# Patient Record
Sex: Male | Born: 1935 | Race: White | Hispanic: No | Marital: Married | Smoking: Former smoker
Health system: Southern US, Community
[De-identification: ages and names within clinical notes are randomized; demographics above are authoritative.]

## PROBLEM LIST (undated history)

## (undated) DIAGNOSIS — I739 Peripheral vascular disease, unspecified: Secondary | ICD-10-CM

## (undated) DIAGNOSIS — I714 Abdominal aortic aneurysm, without rupture, unspecified: Secondary | ICD-10-CM

## (undated) DIAGNOSIS — I208 Other forms of angina pectoris: Secondary | ICD-10-CM

## (undated) DIAGNOSIS — I509 Heart failure, unspecified: Secondary | ICD-10-CM

## (undated) DIAGNOSIS — I10 Essential (primary) hypertension: Secondary | ICD-10-CM

## (undated) DIAGNOSIS — I219 Acute myocardial infarction, unspecified: Secondary | ICD-10-CM

## (undated) DIAGNOSIS — G459 Transient cerebral ischemic attack, unspecified: Secondary | ICD-10-CM

## (undated) DIAGNOSIS — I639 Cerebral infarction, unspecified: Secondary | ICD-10-CM

## (undated) DIAGNOSIS — J449 Chronic obstructive pulmonary disease, unspecified: Secondary | ICD-10-CM

## (undated) DIAGNOSIS — M353 Polymyalgia rheumatica: Secondary | ICD-10-CM

## (undated) DIAGNOSIS — I2089 Other forms of angina pectoris: Secondary | ICD-10-CM

## (undated) DIAGNOSIS — C61 Malignant neoplasm of prostate: Secondary | ICD-10-CM

## (undated) DIAGNOSIS — F419 Anxiety disorder, unspecified: Secondary | ICD-10-CM

## (undated) DIAGNOSIS — D649 Anemia, unspecified: Secondary | ICD-10-CM

## (undated) DIAGNOSIS — I2510 Atherosclerotic heart disease of native coronary artery without angina pectoris: Secondary | ICD-10-CM

## (undated) DIAGNOSIS — E785 Hyperlipidemia, unspecified: Secondary | ICD-10-CM

## (undated) HISTORY — DX: Atherosclerotic heart disease of native coronary artery without angina pectoris: I25.10

## (undated) HISTORY — DX: Other forms of angina pectoris: I20.89

## (undated) HISTORY — PX: CAROTID ENDARTERECTOMY: SUR193

## (undated) HISTORY — DX: Anemia, unspecified: D64.9

## (undated) HISTORY — DX: Malignant neoplasm of prostate: C61

## (undated) HISTORY — PX: CARDIAC CATHETERIZATION: SHX172

## (undated) HISTORY — DX: Cerebral infarction, unspecified: I63.9

## (undated) HISTORY — DX: Polymyalgia rheumatica: M35.3

## (undated) HISTORY — DX: Heart failure, unspecified: I50.9

## (undated) HISTORY — DX: Essential (primary) hypertension: I10

## (undated) HISTORY — DX: Abdominal aortic aneurysm, without rupture: I71.4

## (undated) HISTORY — DX: Hyperlipidemia, unspecified: E78.5

## (undated) HISTORY — DX: Abdominal aortic aneurysm, without rupture, unspecified: I71.40

## (undated) HISTORY — DX: Transient cerebral ischemic attack, unspecified: G45.9

## (undated) HISTORY — DX: Other forms of angina pectoris: I20.8

## (undated) HISTORY — PX: ABDOMINAL AORTIC ANEURYSM REPAIR: SUR1152

## (undated) HISTORY — PX: CORONARY ARTERY BYPASS GRAFT: SHX141

## (undated) HISTORY — DX: Chronic obstructive pulmonary disease, unspecified: J44.9

## (undated) HISTORY — DX: Peripheral vascular disease, unspecified: I73.9

## (undated) HISTORY — DX: Acute myocardial infarction, unspecified: I21.9

---

## 2010-04-28 ENCOUNTER — Ambulatory Visit: Payer: Medicare Other | Admitting: Internal Medicine

## 2010-05-03 ENCOUNTER — Inpatient Hospital Stay: Payer: Medicare Other | Admitting: *Deleted

## 2010-05-03 ENCOUNTER — Ambulatory Visit: Payer: Self-pay | Admitting: Cardiovascular Disease

## 2010-05-10 ENCOUNTER — Inpatient Hospital Stay: Payer: Medicare Other | Admitting: Internal Medicine

## 2010-05-10 ENCOUNTER — Ambulatory Visit: Payer: Self-pay | Admitting: Cardiovascular Disease

## 2010-05-16 ENCOUNTER — Encounter: Payer: Self-pay | Admitting: Cardiovascular Disease

## 2010-05-16 ENCOUNTER — Inpatient Hospital Stay: Payer: Medicare Other | Admitting: Internal Medicine

## 2010-05-18 ENCOUNTER — Ambulatory Visit: Payer: Self-pay | Admitting: Cardiovascular Disease

## 2010-05-21 LAB — PATHOLOGY REPORT

## 2010-05-28 ENCOUNTER — Ambulatory Visit: Payer: Self-pay | Admitting: Cardiovascular Disease

## 2010-05-28 DIAGNOSIS — E785 Hyperlipidemia, unspecified: Secondary | ICD-10-CM

## 2010-05-28 DIAGNOSIS — I6529 Occlusion and stenosis of unspecified carotid artery: Secondary | ICD-10-CM

## 2010-05-28 DIAGNOSIS — J449 Chronic obstructive pulmonary disease, unspecified: Secondary | ICD-10-CM | POA: Insufficient documentation

## 2010-05-28 DIAGNOSIS — I25810 Atherosclerosis of coronary artery bypass graft(s) without angina pectoris: Secondary | ICD-10-CM

## 2010-07-19 DIAGNOSIS — G459 Transient cerebral ischemic attack, unspecified: Secondary | ICD-10-CM

## 2010-07-19 HISTORY — DX: Transient cerebral ischemic attack, unspecified: G45.9

## 2010-08-07 ENCOUNTER — Encounter: Payer: Self-pay | Admitting: Cardiovascular Disease

## 2010-08-08 ENCOUNTER — Encounter: Payer: Self-pay | Admitting: Cardiovascular Disease

## 2010-08-09 ENCOUNTER — Encounter: Payer: Self-pay | Admitting: Cardiovascular Disease

## 2010-08-09 ENCOUNTER — Ambulatory Visit: Payer: Self-pay | Admitting: Cardiovascular Disease

## 2010-08-09 DIAGNOSIS — G459 Transient cerebral ischemic attack, unspecified: Secondary | ICD-10-CM | POA: Insufficient documentation

## 2010-08-30 ENCOUNTER — Encounter: Payer: Self-pay | Admitting: Cardiovascular Disease

## 2010-08-31 ENCOUNTER — Emergency Department: Payer: Medicare Other | Admitting: Emergency Medicine

## 2010-09-18 NOTE — Letter (Signed)
Summary: Medical Record Release  Medical Record Release   Imported By: Harlon Flor 06/08/2010 08:05:23  _____________________________________________________________________  External Attachment:    Type:   Image     Comment:   External Document

## 2010-09-18 NOTE — Consult Note (Signed)
Summary[facility]ist, [facility]   [place] [facility]   Imported By: Roderic Ovens 05/30/2010 10:19:32  _____________________________________________________________________  External Attachment:    Type:   Image     Comment:   External Document

## 2010-09-18 NOTE — Assessment & Plan Note (Signed)
Summary: F/U [facility] Chest pain   Visit Type:  Initial Consult Primary Provider:  Jeanie Cooks, M.D.  CC:  F/U [facility].Marland Kitchen  History of Present Illness: 75 year old male with history of smoking, COPD, coronary artery disease with bypass surgery in 2007, occlusion of 2 vein grafts with PCI x4, peripheral vascular disease with bilateral carotid endarterectomies in 2002, history of TIA in 2002 who presented several times to [facility] in September with COPD exacerbation, chest pain from coughing and possibly angina who presents to reestablish care after his discharge.  He reports that since discharge, he is feeling well. He was on a  long course of antibiotics, steroids and at the end of September had a upper GI bleed. He had an endoscope that showed esophagitis, gastritis and duodenitis. He has done better with no aspirin, discontinue Plavix and is on a proton pump inhibitor.  His shortness of breath and coughing has improved though he does continue to feel weak.  Last catheterization was August 2001 at [facility]. Dr. Georges Mouse was his physician/cardiologist  Of note he stopped smoking 2 years ago and smoked for 40 years  EKG shows normal sinus rhythm/sinus bradycardia with rate of 50 beats per minute, no significant ST or T wave changes  echocardiogram from September of 2011 shows normal systolic function estimated at greater than 55%, mild LVH, diastolic dysfunction, borderline elevated right ventricular systolic pressures, trace MR and TR  Current Medications (verified): 1)  Plavix 75 Mg Tabs (Clopidogrel Bisulfate) .... One Tablet Once Daily 2)  Metoprolol Succinate 100 Mg Xr24h-Tab (Metoprolol Succinate) .... One Tablet Once Daily 3)  Crestor 20 Mg Tabs (Rosuvastatin Calcium) .... One Tablet At Bedtime 4)  Zetia 10 Mg Tabs (Ezetimibe) .... One Tablet Once Daily 5)  Ranexa 500 Mg Xr12h-Tab (Ranolazine) .... One Tablet Once Daily 6)  Nitroglycerin 0.4 Mg/hr Pt24 (Nitroglycerin) .... As  Needed 7)  Lovaza 1 Gm Caps (Omega-3-Acid Ethyl Esters) .... One Tablet Once Daily 8)  Centrum Silver  Tabs (Multiple Vitamins-Minerals) .... One Tablet Once Daily 9)  Vitamin C Cr 500 Mg Cr-Caps (Ascorbic Acid) .... One Tablet Once Daily 10)  Combivent 18-103 Mcg/act Aero (Ipratropium-Albuterol) .... As Directed 11)  Protonix 40 Mg Tbec (Pantoprazole Sodium) .... One Tablet Once Daily For Two Weeks 12)  Advair Diskus 250-50 Mcg/dose Aepb (Fluticasone-Salmeterol) .... Inhalation Two Times A Day 13)  Spiriva Handihaler 18 Mcg Caps (Tiotropium Bromide Monohydrate) .... One Inhalatation Daily 14)  Duoneb 0.5-2.5 (3) Mg/106ml Soln (Ipratropium-Albuterol) .... Four Times Once Daily  Allergies (verified): 1)  ! Steroids  Past History:  Past Medical History: Last updated: 05/28/2010 CAD Hypertension chronic angina PVD prostate cancer s/p brachytherapy hyperlipidemia COPD  Past Surgical History: Last updated: 05/28/2010 CAD; s/p CABG 6 yrs. ago Stents 2009 Bilateral carotid endarterectomies  Family History: Last updated: 05/28/2010 Family History of Diabetes: mother; died of old age with CAD Father: Deceased throat and stomach cancer. Siblings: Brother deceased age 45 of MI and polio.  Social History: Last updated: 05/28/2010 Tobacco Use - Former. Smoked x 40 years 1/2-1 PPD. Quit 2009. Alcohol Use - no Drug Use - no Retired -- IT consultant.  Risk Factors: Smoking Status: quit (05/28/2010)  Social History: Tobacco Use - Former. Smoked x 40 years 1/2-1 PPD. Quit 2009. Alcohol Use - no Drug Use - no Retired -- IT consultant.  Review of Systems  The patient denies fever, weight loss, weight gain, vision loss, decreased hearing, hoarseness, chest pain, syncope, dyspnea on exertion, peripheral edema, prolonged  cough, abdominal pain, incontinence, muscle weakness, depression, and enlarged lymph nodes.    Vital Signs:  Patient profile:   76 year old  male Height:      73 inches Weight:      225 pounds BMI:     29.79 Pulse rate:   52 / minute BP sitting:   154 / 84  (left arm) Cuff size:   regular  Vitals Entered By: Bishop Dublin, CMA (May 28, 2010 2:59 PM)  Nutrition Counseling: Patient's BMI is greater than 25 and therefore counseled on weight management options.  Physical Exam  General:  Well developed, well nourished, in no acute distress. Head:  normocephalic and atraumatic Neck:  Neck supple, no JVD. No masses, thyromegaly or abnormal cervical nodes. Lungs:  mildly decreased breath sounds throughout bilaterally Heart:  Non-displaced PMI, chest non-tender; regular rate and rhythm, S1, S2 without murmurs, rubs or gallops. Carotid upstroke normal, no bruit.. Pedals normal pulses. No edema, no varicosities. Abdomen:  Bowel sounds positive; abdomen soft and non-tender without masses Msk:  Back normal, normal gait. Muscle strength and tone normal. Neurologic:  Alert and oriented x 3. Skin:  Intact without lesions or rashes. Psych:  Normal affect.   Impression & Recommendations:  Problem # 1:  CAD, ARTERY BYPASS GRAFT (ICD-414.04) history of bypass surgery in 2007. We'll try to obtain these records for our notes. No chest pain at this time. No further testing.  The following medications were removed from the medication list:    Buffered Aspirin 325 Mg Tabs (Aspirin buf(cacarb-mgcarb-mgo)) ..... One tablet once daily His updated medication list for this problem includes:    Plavix 75 Mg Tabs (Clopidogrel bisulfate) ..... One tablet once daily    Metoprolol Succinate 100 Mg Xr24h-tab (Metoprolol succinate) ..... One tablet once daily    Ranexa 500 Mg Xr12h-tab (Ranolazine) ..... One tablet once daily    Nitroglycerin 0.4 Mg/hr Pt24 (Nitroglycerin) .Marland Kitchen... As needed  Problem # 2:  CAROTID ARTERY STENOSIS, WITHOUT INFARCTION (ICD-433.10) Bilateral carotid endarterectomy in 2002. We'll try to obtain his most recent carotid  ultrasound for record. He reports having one approximately 8 months ago. Continue Plavix. Aspirin was held secondary to GI bleed but will be restarted at 81 mg in 2 weeks' time.  The following medications were removed from the medication list:    Buffered Aspirin 325 Mg Tabs (Aspirin buf(cacarb-mgcarb-mgo)) ..... One tablet once daily His updated medication list for this problem includes:    Plavix 75 Mg Tabs (Clopidogrel bisulfate) ..... One tablet once daily  Problem # 3:  HYPERLIPIDEMIA-MIXED (ICD-272.4) He is on aggressive medical management. We'll try to obtain a copy of his most recent lipid panel prior records.  His updated medication list for this problem includes:    Crestor 20 Mg Tabs (Rosuvastatin calcium) ..... One tablet at bedtime    Zetia 10 Mg Tabs (Ezetimibe) ..... One tablet once daily    Lovaza 1 Gm Caps (Omega-3-acid ethyl esters) ..... One tablet once daily  Problem # 4:  COPD (ICD-496) On history of smoking though he stopped 40 years ago. He did just recover from a COPD exacerbation. This required a long course of steroids, antibiotics and nebulizer treatments  His updated medication list for this problem includes:    Combivent 18-103 Mcg/act Aero (Ipratropium-albuterol) .Marland Kitchen... As directed    Advair Diskus 250-50 Mcg/dose Aepb (Fluticasone-salmeterol) ..... Inhalation two times a day    Spiriva Handihaler 18 Mcg Caps (Tiotropium bromide monohydrate) ..... One inhalatation daily  Duoneb 0.5-2.5 (3) Mg/31ml Soln (Ipratropium-albuterol) .Marland Kitchen... Four times once daily  Patient Instructions: 1)  Your physician recommends that you continue on your current medications as directed. Please refer to the Current Medication list given to you today. 2)  Your physician wants you to follow-up in:   6 months You will receive a reminder letter in the mail two months in advance. If you don't receive a letter, please call our office to schedule the follow-up appointment.

## 2010-09-20 NOTE — Letter (Signed)
Summary: [facility]   Imported By: Roderic Ovens 09/05/2010 15:02:00  _____________________________________________________________________  External Attachment:    Type:   Image     Comment:   External Document

## 2010-09-20 NOTE — Assessment & Plan Note (Signed)
Summary: ROV/AMD   Visit Type:  Follow-up Primary Provider:  Jeanie Cooks, M.D.  CC:  F/U [facility]; TIA. Is wearing a 24 hour monitor.Marland Kitchen  History of Present Illness: 75 year old male with history of smoking, COPD, coronary artery disease with bypass surgery in 2007, occlusion of 2 vein grafts with PCI x4, peripheral vascular disease with bilateral carotid endarterectomies in 2002, history of TIA in 2002 who presented several times to [facility] in September with COPD exacerbation, who presents after a recent TIA.  This past Monday, he developed arm and leg weakness on the left in the late evening. By the time he arrived at [facility] by EMT his symptoms were resolving. TPA was not given. He had significant workup including CT, MRI, MRA, echocardiogram and is now wearing a Holter monitor. he feels well and has no residual deficits. Overall he feels "100% ".  He was found to be resistant to Plavix by P2 Y. 12 assay.  hospital records indicate that he has asymmetric LVH that is severe, diastolic relaxation abnormality, normal systolic function, mild MR significant chordal SAM with LVOT gradient of 3 m/s and gradient of 35 mm of mercury and increases to gradient of 49 with Valsalva  Last catheterization was August 2001 at [facility]. Dr. Georges Mouse was his physician/cardiologist  Of note he stopped smoking 2 years ago and smoked for 40 years  EKG shows normal sinus rhythm with rate of 56 beats per minute, no significant ST or T wave changes  Current Medications (verified): 1)  Metoprolol Succinate 100 Mg Xr24h-Tab (Metoprolol Succinate) .... One Tablet Once Daily 2)  Crestor 20 Mg Tabs (Rosuvastatin Calcium) .... One Tablet At Bedtime 3)  Zetia 10 Mg Tabs (Ezetimibe) .... One Tablet Once Daily 4)  Ranexa 500 Mg Xr12h-Tab (Ranolazine) .... One Tablet Once Daily 5)  Nitroglycerin 0.4 Mg/hr Pt24 (Nitroglycerin) .... As Needed 6)  Centrum Silver  Tabs (Multiple Vitamins-Minerals) .... One Tablet Once  Daily 7)  Vitamin C Cr 500 Mg Cr-Caps (Ascorbic Acid) .... One Tablet Once Daily 8)  Combivent 18-103 Mcg/act Aero (Ipratropium-Albuterol) .... As Directed 9)  Advair Diskus 250-50 Mcg/dose Aepb (Fluticasone-Salmeterol) .... Inhalation Two Times A Day 10)  Spiriva Handihaler 18 Mcg Caps (Tiotropium Bromide Monohydrate) .... One Inhalatation Daily 11)  Duoneb 0.5-2.5 (3) Mg/14ml Soln (Ipratropium-Albuterol) .... Four Times Once Daily 12)  Aggrenox 25-200 Mg Xr12h-Cap (Aspirin-Dipyridamole) .... One Tablet Every 12 Hours 13)  Hydrochlorothiazide 12.5 Mg Caps (Hydrochlorothiazide) .... One Tablet Once Daily 14)  Aspir-Low 81 Mg Tbec (Aspirin) .... One Tablet Once Daily 15)  Eligard Subq .... Subq Every 6 Months. 16)  Fish Oil 300 Mg Caps (Omega-3 Fatty Acids) .... One Tablet Two Times A Day 17)  Stool Softener 100 Mg Caps (Docusate Sodium) .... One Caps Two Times A Day  Allergies (verified): 1)  ! Steroids  Past History:  Past Medical History: Last updated: 05/28/2010 CAD Hypertension chronic angina PVD prostate cancer s/p brachytherapy hyperlipidemia COPD  Past Surgical History: Last updated: 05/28/2010 CAD; s/p CABG 6 yrs. ago Stents 2009 Bilateral carotid endarterectomies  Family History: Last updated: 05/28/2010 Family History of Diabetes: mother; died of old age with CAD Father: Deceased throat and stomach cancer. Siblings: Brother deceased age 29 of MI and polio.  Social History: Last updated: 05/28/2010 Tobacco Use - Former. Smoked x 40 years 1/2-1 PPD. Quit 2009. Alcohol Use - no Drug Use - no Retired -- IT consultant.  Risk Factors: Smoking Status: quit (05/28/2010)  Review of Systems  The  patient denies fever, weight loss, weight gain, vision loss, decreased hearing, hoarseness, chest pain, syncope, dyspnea on exertion, peripheral edema, prolonged cough, abdominal pain, incontinence, muscle weakness, depression, and enlarged lymph nodes.    Vital  Signs:  Patient profile:   75 year old male Height:      73 inches Weight:      235 pounds BMI:     31.12 Pulse rate:   56 / minute BP sitting:   120 / 82  (left arm) Cuff size:   large  Vitals Entered By: Bishop Dublin, CMA (August 09, 2010 3:38 PM)  Physical Exam  General:  Well developed, well nourished, in no acute distress.elderly gentleman Head:  normocephalic and atraumatic Neck:  Neck supple, no JVD. No masses, thyromegaly or abnormal cervical nodes. Lungs:  mildly decreased breath sounds throughout bilaterally Heart:  Non-displaced PMI, chest non-tender; regular rate and rhythm, S1, S2 without murmurs, rubs or gallops. Carotid upstroke normal, no bruit.. Pedals normal pulses. No edema, no varicosities. Abdomen:  Bowel sounds positive; abdomen soft and non-tender without masses Msk:  Back normal, normal gait. Muscle strength and tone normal. Pulses:  pulses normal in all 4 extremities Extremities:  No clubbing or cyanosis. Neurologic:  Alert and oriented x 3. Skin:  Intact without lesions or rashes. Psych:  Normal affect.   Impression & Recommendations:  Problem # 1:  TIA (ICD-435.9) recent TIA, now has made a full recovery. I am concerned that the Aggrenox will be cost prohibitive as he is in his doughnut hole. I've asked him to Price the medication to confirm this. Alternatively, he can take aspirin 81 mg daily with effient.  He is resistant to Plavix.  The etiology of his TIA is likely secondary to embolic from atherosclerotic disease.  Problem # 2:  HYPERLIPIDEMIA-MIXED (ICD-272.4)  cholesterol is at goal on his current medication regimen.  The following medications were removed from the medication list:    Lovaza 1 Gm Caps (Omega-3-acid ethyl esters) ..... One tablet once daily His updated medication list for this problem includes:    Crestor 20 Mg Tabs (Rosuvastatin calcium) ..... One tablet at bedtime    Zetia 10 Mg Tabs (Ezetimibe) ..... One tablet  once daily  The following medications were removed from the medication list:    Lovaza 1 Gm Caps (Omega-3-acid ethyl esters) ..... One tablet once daily His updated medication list for this problem includes:    Crestor 20 Mg Tabs (Rosuvastatin calcium) ..... One tablet at bedtime    Zetia 10 Mg Tabs (Ezetimibe) ..... One tablet once daily  Problem # 3:  CAD, ARTERY BYPASS GRAFT (ICD-414.04) No symptoms of chest pain at this time. We did mention to him that he could hold his Ranexa and if he did not have any chest discomfort, could continue to hold the medication.  The following medications were removed from the medication list:    Plavix 75 Mg Tabs (Clopidogrel bisulfate) ..... One tablet once daily    Ranexa 500 Mg Xr12h-tab (Ranolazine) ..... One tablet once daily His updated medication list for this problem includes:    Metoprolol Succinate 100 Mg Xr24h-tab (Metoprolol succinate) ..... One tablet once daily    Nitroglycerin 0.4 Mg/hr Pt24 (Nitroglycerin) .Marland Kitchen... As needed    Aggrenox 25-200 Mg Xr12h-cap (Aspirin-dipyridamole) ..... One tablet every 12 hours    Aspir-low 81 Mg Tbec (Aspirin) ..... One tablet once daily    Effient 10 Mg Tabs (Prasugrel hcl) .Marland Kitchen... Take one tablet by mouth daily  Problem # 4:  CAROTID ARTERY STENOSIS, WITHOUT INFARCTION (ICD-433.10)  MRA shows patent carotids.  The following medications were removed from the medication list:    Plavix 75 Mg Tabs (Clopidogrel bisulfate) ..... One tablet once daily His updated medication list for this problem includes:    Aggrenox 25-200 Mg Xr12h-cap (Aspirin-dipyridamole) ..... One tablet every 12 hours    Aspir-low 81 Mg Tbec (Aspirin) ..... One tablet once daily    Effient 10 Mg Tabs (Prasugrel hcl) .Marland Kitchen... Take one tablet by mouth daily  The following medications were removed from the medication list:    Plavix 75 Mg Tabs (Clopidogrel bisulfate) ..... One tablet once daily His updated medication list for this problem  includes:    Aggrenox 25-200 Mg Xr12h-cap (Aspirin-dipyridamole) ..... One tablet every 12 hours    Aspir-low 81 Mg Tbec (Aspirin) ..... One tablet once daily    Effient 10 Mg Tabs (Prasugrel hcl) .Marland Kitchen... Take one tablet by mouth daily  Problem # 5:  COPD (ICD-496)  He does have shortness of breath. Long history of smoking. Stopped several years ago.  His updated medication list for this problem includes:    Combivent 18-103 Mcg/act Aero (Ipratropium-albuterol) .Marland Kitchen... As directed    Advair Diskus 250-50 Mcg/dose Aepb (Fluticasone-salmeterol) ..... Inhalation two times a day    Spiriva Handihaler 18 Mcg Caps (Tiotropium bromide monohydrate) ..... One inhalatation daily    Duoneb 0.5-2.5 (3) Mg/44ml Soln (Ipratropium-albuterol) .Marland Kitchen... Four times once daily  His updated medication list for this problem includes:    Combivent 18-103 Mcg/act Aero (Ipratropium-albuterol) .Marland Kitchen... As directed    Advair Diskus 250-50 Mcg/dose Aepb (Fluticasone-salmeterol) ..... Inhalation two times a day    Spiriva Handihaler 18 Mcg Caps (Tiotropium bromide monohydrate) ..... One inhalatation daily    Duoneb 0.5-2.5 (3) Mg/62ml Soln (Ipratropium-albuterol) .Marland Kitchen... Four times once daily  Patient Instructions: 1)  Your physician recommends that you schedule a follow-up appointment in: 3 months 2)  Your physician has recommended you make the following change in your medication: STOP Ranexa.START Effient 10mg  once daily. Prescriptions: EFFIENT 10 MG TABS (PRASUGREL HCL) Take one tablet by mouth daily  #30 x 6   Entered by:   Lanny Hurst RN   Authorized by:   Dossie Arbour MD   Signed by:   Lanny Hurst RN on 08/09/2010   Method used:   Electronically to        [facility] [phone]* (retail)       [address]       [place], [place]  [address]       Ph: [phone]       Fax:    RxID:   [id number]

## 2010-09-26 NOTE — Letter (Signed)
Summary: [facility] - Discharge Summary  [facility] - Discharge Summary   Imported By: Marylou Mccoy 09/19/2010 10:42:02  _____________________________________________________________________  External Attachment:    Type:   Image     Comment:   External Document

## 2010-09-26 NOTE — Letter (Signed)
Summary: [facility]  Neurology Interval Note   [facility] Neurology Interval Note   Imported By: Roderic Ovens 09/21/2010 14:31:41  _____________________________________________________________________  External Attachment:    Type:   Image     Comment:   External Document

## 2010-10-27 ENCOUNTER — Ambulatory Visit: Payer: Medicare Other | Admitting: Family Medicine

## 2010-11-08 ENCOUNTER — Encounter: Payer: Self-pay | Admitting: Cardiovascular Disease

## 2010-11-08 ENCOUNTER — Ambulatory Visit: Payer: Self-pay | Admitting: Cardiovascular Disease

## 2010-11-08 ENCOUNTER — Ambulatory Visit (INDEPENDENT_AMBULATORY_CARE_PROVIDER_SITE_OTHER): Payer: Medicare Other | Admitting: Cardiovascular Disease

## 2010-11-08 ENCOUNTER — Telehealth: Payer: Self-pay | Admitting: Cardiovascular Disease

## 2010-11-08 DIAGNOSIS — G459 Transient cerebral ischemic attack, unspecified: Secondary | ICD-10-CM

## 2010-11-08 DIAGNOSIS — J449 Chronic obstructive pulmonary disease, unspecified: Secondary | ICD-10-CM

## 2010-11-08 DIAGNOSIS — I6529 Occlusion and stenosis of unspecified carotid artery: Secondary | ICD-10-CM

## 2010-11-08 DIAGNOSIS — I25810 Atherosclerosis of coronary artery bypass graft(s) without angina pectoris: Secondary | ICD-10-CM

## 2010-11-08 DIAGNOSIS — E785 Hyperlipidemia, unspecified: Secondary | ICD-10-CM

## 2010-11-08 MED ORDER — ASPIRIN 81 MG PO TBEC: 162.0000 mg | DELAYED_RELEASE_TABLET | Freq: Every day | ORAL | Status: DC

## 2010-11-08 NOTE — Assessment & Plan Note (Signed)
History of bilateral carotid endarterectomies. Would repeat carotid ultrasound early in 2013

## 2010-11-08 NOTE — Assessment & Plan Note (Signed)
History of underlying COPD. He stopped smoking many years ago. Continued mild chronic shortness of breath with episodic bronchitis.

## 2010-11-08 NOTE — Assessment & Plan Note (Signed)
Cholesterol is at goal on the current lipid regimen. No changes to the medications were made.  

## 2010-11-08 NOTE — Patient Instructions (Addendum)
Please increase asa to 81 mg x2. Stop effient when you run out of your current prescription.  Carotid ultrasound in December 2012 or early 2013. Follow in in 6 months.

## 2010-11-08 NOTE — Assessment & Plan Note (Signed)
Currently with no symptoms of angina. No further workup at this time. Continue current medication regimen. 

## 2010-11-08 NOTE — Assessment & Plan Note (Signed)
No further TIA episodes. We'll continue aggressive medical management. Will increase aspirin to 81 mg x2. He is unable to afford effient.

## 2010-11-08 NOTE — Progress Notes (Signed)
Patient ID: Javier [name], male    DOB: 1936/01/03, 75 y.o.   MRN: [id number]  HPI 75 year old male with history of smoking, COPD, coronary artery disease with bypass surgery in 2007, occlusion of 2 vein grafts with PCI x4, peripheral vascular disease with bilateral carotid endarterectomies in 2002, history of TIA in 2002 who presented several times to [facility] in September with COPD exacerbation,TIA, who presents for routine followup.  He reports that he has been doing well. He denies any significant chest pain. He does have baseline mild shortness of breath with no cough. He is getting over a bronchitis which he reports having several times per year. He is otherwise active and has no complaints.  Several months ago, he developed arm and leg weakness on the left in the late evening. By the time he arrived at [facility] by EMT his symptoms were resolving. TPA was not given. He had significant workup including CT, MRI, MRA, echocardiogram and is now wearing a Holter monitor. he feels well and has no residual deficits. Overall he feels "100% ".  He was found to be resistant to Plavix by P2 Y12 assay.  hospital records indicate that he has asymmetric LVH that is severe, diastolic relaxation abnormality, normal systolic function, mild MR significant chordal SAM with LVOT gradient of 3 m/s and gradient of 35 mm of mercury and increases to gradient of 49 with Valsalva  Last catheterization was August 2001 at [facility]. Dr. Georges Mouse was his physician/cardiologist Of note he stopped smoking 2 years ago and smoked for 73 years  Old EKG shows normal sinus rhythm with rate of 56 beats per minute, no significant ST or T wave changes   Review of Systems  Constitutional: Negative.   HENT: Negative.   Eyes: Negative.   Respiratory: Positive for shortness of breath. Negative for cough, choking, chest tightness, wheezing and stridor.   Cardiovascular: Negative.   Gastrointestinal: Negative.     Musculoskeletal: Negative.   Skin: Negative.   Neurological: Negative.   Hematological: Negative.   Psychiatric/Behavioral: Negative.   All other systems reviewed and are negative.   BP 129/76  Pulse 69  Ht 6\' 1"  (1.854 m)  Wt 232 lb 6.4 oz (105.416 kg)  BMI 30.66 kg/m2   Physical Exam  Nursing note and vitals reviewed. Constitutional: He is oriented to person, place, and time. He appears well-developed and well-nourished.  HENT:  Head: Normocephalic.  Nose: Nose normal.  Mouth/Throat: Oropharynx is clear and moist.  Eyes: Conjunctivae are normal. Pupils are equal, round, and reactive to light.  Neck: Normal range of motion. Neck supple. No JVD present.  Cardiovascular: Normal rate, regular rhythm, normal heart sounds and intact distal pulses.  Exam reveals no gallop and no friction rub.   No murmur heard. Pulmonary/Chest: Effort normal. No respiratory distress. He has no wheezes. He has no rales. He exhibits no tenderness.       Mildly decreased BS b/l throughout  Abdominal: Soft. Bowel sounds are normal. He exhibits no distension. There is no tenderness.  Musculoskeletal: Normal range of motion. He exhibits no edema and no tenderness.  Lymphadenopathy:    He has no cervical adenopathy.  Neurological: He is alert and oriented to person, place, and time. Coordination normal.  Skin: Skin is warm and dry. No rash noted. No erythema.  Psychiatric: He has a normal mood and affect. His behavior is normal. Judgment and thought content normal.           Assessment  and Plan

## 2010-11-08 NOTE — Telephone Encounter (Signed)
LMOM TCB to move am appt to later today.  Javier [name] is in a cath.

## 2010-11-09 ENCOUNTER — Encounter: Payer: Self-pay | Admitting: Cardiovascular Disease

## 2010-11-18 ENCOUNTER — Inpatient Hospital Stay: Payer: Medicare Other | Admitting: Internal Medicine

## 2010-11-18 DIAGNOSIS — I219 Acute myocardial infarction, unspecified: Secondary | ICD-10-CM

## 2010-11-18 HISTORY — DX: Acute myocardial infarction, unspecified: I21.9

## 2010-11-20 DIAGNOSIS — R000 Tachycardia, unspecified: Secondary | ICD-10-CM

## 2010-11-26 ENCOUNTER — Encounter: Payer: Self-pay | Admitting: Cardiovascular Disease

## 2010-12-06 ENCOUNTER — Encounter: Payer: No Typology Code available for payment source | Admitting: Cardiovascular Disease

## 2010-12-10 ENCOUNTER — Ambulatory Visit (INDEPENDENT_AMBULATORY_CARE_PROVIDER_SITE_OTHER): Payer: Medicare Other | Admitting: Cardiovascular Disease

## 2010-12-10 ENCOUNTER — Encounter: Payer: Self-pay | Admitting: Cardiovascular Disease

## 2010-12-10 DIAGNOSIS — G459 Transient cerebral ischemic attack, unspecified: Secondary | ICD-10-CM

## 2010-12-10 DIAGNOSIS — I2510 Atherosclerotic heart disease of native coronary artery without angina pectoris: Secondary | ICD-10-CM

## 2010-12-10 DIAGNOSIS — Z9889 Other specified postprocedural states: Secondary | ICD-10-CM

## 2010-12-10 DIAGNOSIS — J449 Chronic obstructive pulmonary disease, unspecified: Secondary | ICD-10-CM

## 2010-12-10 DIAGNOSIS — I6529 Occlusion and stenosis of unspecified carotid artery: Secondary | ICD-10-CM

## 2010-12-10 DIAGNOSIS — I25810 Atherosclerosis of coronary artery bypass graft(s) without angina pectoris: Secondary | ICD-10-CM

## 2010-12-10 DIAGNOSIS — E785 Hyperlipidemia, unspecified: Secondary | ICD-10-CM

## 2010-12-10 NOTE — Assessment & Plan Note (Signed)
We have suggested that he have his cholesterol checked in 3 months time. He is now on Lipitor and zetia.

## 2010-12-10 NOTE — Progress Notes (Signed)
   Patient ID: Javier [name], male    DOB: Nov 25, 1935, 75 y.o.   MRN: [id number]  HPI Comments: 75 year old male with history of smoking, COPD, coronary artery disease with bypass surgery in 2007, occlusion of 2 vein grafts with PCI x4, peripheral vascular disease with bilateral carotid endarterectomies in 2002, history of TIA in 2002 who presented several times to [facility] in September with COPD exacerbation,  TIA, Presents after a recent episode of COPD exacerbation/bronchitis also with episode of shortness of breath and chest pain that took him to [facility] where he had a cardiac catheterization showing patent vessels with no significant progression of his disease. We have are requested this report for our records.  He reports that he had a small troponin elevation in the setting of profound coughing. Cardiac catheter showed no progression of his disease. He has since felt well. He realized that he was not taking his inhalers on a regular basis which he is doing now. He takes Spiriva and Advair on a regular daily basis with albuterol p.r.n..   He was found to be resistant to Plavix by P2 Y. 12 assay During the hospital course for his TIA at [facility] records indicate that he has asymmetric LVH that is severe, diastolic relaxation abnormality, normal systolic function, mild MR significant chordal SAM with LVOT gradient of 3 m/s and gradient of 35 mm of mercury and increases to gradient of 49 with Valsalva  Of note he stopped smoking 2 years ago and smoked for 40 years  EKG shows normal sinus rhythm with rate of 59 beats per minute, no significant ST or T wave changes      Review of Systems  Constitutional: Negative.   HENT: Negative.   Eyes: Negative.   Respiratory: Negative.   Cardiovascular: Negative.   Gastrointestinal: Negative.   Musculoskeletal: Negative.   Skin: Negative.   Neurological: Negative.   Hematological: Negative.   Psychiatric/Behavioral: Negative.   All other  systems reviewed and are negative.    BP 132/70  Pulse 59  Ht 6\' 1"  (1.854 m)  Wt 236 lb 12.8 oz (107.412 kg)  BMI 31.24 kg/m2   Physical Exam  Nursing note and vitals reviewed. Constitutional: He is oriented to person, place, and time. He appears well-developed and well-nourished.  HENT:  Head: Normocephalic.  Nose: Nose normal.  Mouth/Throat: Oropharynx is clear and moist.  Eyes: Conjunctivae are normal. Pupils are equal, round, and reactive to light.  Neck: Normal range of motion. Neck supple. No JVD present.  Cardiovascular: Normal rate, regular rhythm, S1 normal, S2 normal, normal heart sounds and intact distal pulses.  Exam reveals no gallop and no friction rub.   No murmur heard. Pulmonary/Chest: Effort normal. No respiratory distress. He has decreased breath sounds. He has no wheezes. He has no rales. He exhibits no tenderness.  Abdominal: Soft. Bowel sounds are normal. He exhibits no distension. There is no tenderness.  Musculoskeletal: Normal range of motion. He exhibits no edema and no tenderness.  Lymphadenopathy:    He has no cervical adenopathy.  Neurological: He is alert and oriented to person, place, and time. Coordination normal.  Skin: Skin is warm and dry. No rash noted. No erythema.  Psychiatric: He has a normal mood and affect. His behavior is normal. Judgment and thought content normal.           Assessment and Plan

## 2010-12-10 NOTE — Patient Instructions (Signed)
You are doing well. Hold effient. Start aspirin 81 x2 daily. Check cholesterol in 3 months. Carotid ultrasound will be scheduled, next available.  Please call us if you have new issues that need to be addressed before your next appt.  We will call you for a follow up Appt. In 6 months

## 2010-12-10 NOTE — Assessment & Plan Note (Signed)
Appears to be well controlled on his current medication regimen.

## 2010-12-10 NOTE — Assessment & Plan Note (Signed)
We will schedule a carotid ultrasound given that it has been in numerous years since this was done.

## 2010-12-10 NOTE — Assessment & Plan Note (Addendum)
Currently with no symptoms of angina. No further workup at this time. Continue current medication regimen. We will change him to aspirin 81 mg x2, hold the effient.

## 2010-12-12 ENCOUNTER — Ambulatory Visit: Payer: No Typology Code available for payment source | Admitting: Cardiovascular Disease

## 2010-12-20 ENCOUNTER — Encounter (INDEPENDENT_AMBULATORY_CARE_PROVIDER_SITE_OTHER): Payer: Medicare Other | Admitting: *Deleted

## 2010-12-20 DIAGNOSIS — I6529 Occlusion and stenosis of unspecified carotid artery: Secondary | ICD-10-CM

## 2010-12-20 DIAGNOSIS — Z9889 Other specified postprocedural states: Secondary | ICD-10-CM

## 2010-12-24 ENCOUNTER — Encounter: Payer: Self-pay | Admitting: Cardiovascular Disease

## 2011-01-06 ENCOUNTER — Encounter: Payer: Self-pay | Admitting: Cardiovascular Disease

## 2011-01-16 ENCOUNTER — Encounter: Payer: Self-pay | Admitting: Cardiovascular Disease

## 2011-01-30 ENCOUNTER — Encounter: Payer: Medicare Other | Admitting: Cardiovascular Disease

## 2011-02-17 ENCOUNTER — Encounter: Payer: Medicare Other | Admitting: Cardiovascular Disease

## 2011-03-01 ENCOUNTER — Encounter: Payer: Self-pay | Admitting: Cardiovascular Disease

## 2011-03-11 ENCOUNTER — Other Ambulatory Visit (INDEPENDENT_AMBULATORY_CARE_PROVIDER_SITE_OTHER): Payer: Medicare Other | Admitting: *Deleted

## 2011-03-11 DIAGNOSIS — E785 Hyperlipidemia, unspecified: Secondary | ICD-10-CM

## 2011-03-11 DIAGNOSIS — E789 Disorder of lipoprotein metabolism, unspecified: Secondary | ICD-10-CM

## 2011-03-12 LAB — LIPID PANEL
Cholesterol: 176 mg/dL (ref 0–200)
HDL: 37 mg/dL — ABNORMAL LOW (ref >39)
Total CHOL/HDL Ratio: 4.8 Ratio
Triglycerides: 298 mg/dL — ABNORMAL HIGH (ref <150)
VLDL: 60 mg/dL — ABNORMAL HIGH (ref 0–40)

## 2011-03-12 LAB — HEPATIC FUNCTION PANEL
ALT: 16 U/L (ref 0–53)
Albumin: 4.3 g/dL (ref 3.5–5.2)
Total Protein: 6.7 g/dL (ref 6.0–8.3)

## 2011-03-20 ENCOUNTER — Encounter: Payer: Medicare Other | Admitting: Cardiovascular Disease

## 2011-03-25 ENCOUNTER — Encounter: Payer: Self-pay | Admitting: Cardiovascular Disease

## 2011-04-20 ENCOUNTER — Encounter: Payer: Medicare Other | Admitting: Cardiovascular Disease

## 2011-04-29 ENCOUNTER — Encounter: Payer: Medicare Other | Admitting: Orthopaedic Surgery

## 2011-05-20 ENCOUNTER — Encounter: Payer: Medicare Other | Admitting: Orthopaedic Surgery

## 2011-05-20 ENCOUNTER — Encounter: Payer: Medicare Other | Admitting: Cardiovascular Disease

## 2011-06-05 ENCOUNTER — Encounter: Payer: Self-pay | Admitting: Cardiovascular Disease

## 2011-06-10 ENCOUNTER — Ambulatory Visit: Payer: Medicare Other | Admitting: Cardiovascular Disease

## 2011-06-19 ENCOUNTER — Encounter: Payer: Self-pay | Admitting: Cardiovascular Disease

## 2011-06-19 ENCOUNTER — Ambulatory Visit (INDEPENDENT_AMBULATORY_CARE_PROVIDER_SITE_OTHER): Payer: Medicare Other | Admitting: Cardiovascular Disease

## 2011-06-19 VITALS — BP 124/80 | HR 68 | Ht 73.0 in | Wt 246.0 lb

## 2011-06-19 DIAGNOSIS — I2510 Atherosclerotic heart disease of native coronary artery without angina pectoris: Secondary | ICD-10-CM

## 2011-06-19 DIAGNOSIS — I25810 Atherosclerosis of coronary artery bypass graft(s) without angina pectoris: Secondary | ICD-10-CM

## 2011-06-19 DIAGNOSIS — J4489 Other specified chronic obstructive pulmonary disease: Secondary | ICD-10-CM

## 2011-06-19 DIAGNOSIS — I6529 Occlusion and stenosis of unspecified carotid artery: Secondary | ICD-10-CM

## 2011-06-19 DIAGNOSIS — E785 Hyperlipidemia, unspecified: Secondary | ICD-10-CM

## 2011-06-19 DIAGNOSIS — J449 Chronic obstructive pulmonary disease, unspecified: Secondary | ICD-10-CM

## 2011-06-19 MED ORDER — FUROSEMIDE 20 MG PO TABS: 20.0000 mg | ORAL_TABLET | Freq: Every day | ORAL | Status: DC

## 2011-06-19 MED ORDER — NITROGLYCERIN 0.4 MG SL SUBL: 0.4000 mg | SUBLINGUAL_TABLET | SUBLINGUAL | Status: DC | PRN

## 2011-06-19 NOTE — Assessment & Plan Note (Signed)
Bilateral carotid endarterectomy in the past. We'll need to continue aggressive cholesterol management.

## 2011-06-19 NOTE — Assessment & Plan Note (Signed)
Long smoking history and underlying COPD. Several recent episodes of bronchitis with difficulty recovering requiring long courses of antibiotics

## 2011-06-19 NOTE — Assessment & Plan Note (Signed)
Cholesterol is not at goal. Goal LDL less than 70. We have encouraged him to watch his diet

## 2011-06-19 NOTE — Assessment & Plan Note (Signed)
Recent episode of angina with negative cardiac enzymes. We have suggested he stay on his Imdur which was started at [facility]. If he continues to have episodes, we would try to titrate this upwards or add ranexa

## 2011-06-19 NOTE — Progress Notes (Signed)
Patient ID: Javier [name], male    DOB: [birth year], 75 y.o.   MRN: [id number]  HPI Comments: 75 year old male with history of smoking, COPD, coronary artery disease with bypass surgery in 2007, occlusion of 2 vein grafts with PCI x4, peripheral vascular disease with bilateral carotid endarterectomies in 2002, history of TIA in 2002 who presented several times to [facility] in September 2012 with COPD exacerbation,  TIA, episode of COPD exacerbation/bronchitis also with episode of shortness of breath and chest pain that took him to [facility] where he had a cardiac catheterization showing patent vessels with no significant progression of his disease, Who follows up in clinic after having had an episode of chest pain radiating to his jaw for which he went to [facility] and was admitted for observation rule out.   He reports that he has not had any further episodes of chest pain since October 23. He did take one nitroglycerin but did not wait very long before calling EMTs.  Cardiac enzymes were negative x3 and EKG was unchanged from baseline   He was Previously found to be resistant to Plavix by P2 Y. 12 assay During the hospital course for his TIA at [facility] records indicate that he has asymmetric LVH that is severe, diastolic relaxation abnormality, normal systolic function, mild MR significant chordal SAM with LVOT gradient of 3 m/s and gradient of 35 mm of mercury and increases to gradient of 49 with Valsalva  he stopped smoking 2 years ago and smoked for 40 years  EKG shows normal sinus rhythm with rate of 64 beats per minute, no significant ST or T wave changes    Outpatient Encounter Prescriptions as of 06/19/2011  Medication Sig Dispense Refill  . albuterol-ipratropium (COMBIVENT) 18-103 MCG/ACT inhaler Inhale 2 puffs into the lungs every 6 (six) hours as needed.        Marland Kitchen arformoterol (BROVANA) 15 MCG/2ML NEBU Take 15 mcg by nebulization 2 (two) times daily.        . Ascorbic Acid (VITAMIN C)  500 MG tablet Take 1,000 mg by mouth daily.       Marland Kitchen aspirin 81 MG EC tablet Take 162 mg by mouth daily.       Marland Kitchen atorvastatin (LIPITOR) 40 MG tablet Take 40 mg by mouth daily.        . calcium-vitamin D (OSCAL WITH D) 500-200 MG-UNIT per tablet Take 1 tablet by mouth daily.        Marland Kitchen docusate sodium (COLACE) 100 MG capsule Take 100 mg by mouth 2 (two) times daily.        Marland Kitchen ezetimibe (ZETIA) 10 MG tablet 10 mg. Take 1/2 tablet daily.      . hydrochlorothiazide 25 MG tablet Take 25 mg by mouth daily.        Marland Kitchen ipratropium-albuterol (DUONEB) 0.5-2.5 (3) MG/3ML SOLN Take 3 mLs by nebulization 4 (four) times daily.        . isosorbide mononitrate (IMDUR) 30 MG 24 hr tablet Take 30 mg by mouth daily.        Marland Kitchen Leuprolide Acetate (ELIGARD Snowflake) Inject into the skin.       . metoprolol (TOPROL-XL) 100 MG 24 hr tablet Take 100 mg by mouth daily.        . Multiple Vitamins-Minerals (CENTRUM SILVER ULTRA MENS PO) Take 1 tablet by mouth daily.        . nitroGLYCERIN (NITROSTAT) 0.4 MG SL tablet Place 1 tablet (0.4 mg total) under  the tongue every 5 (five) minutes as needed.  25 tablet  11  . Omega-3 Fatty Acids (FISH OIL) 300 MG CAPS Take 1 capsule by mouth 2 (two) times daily.        . pantoprazole (PROTONIX) 40 MG tablet Take 40 mg by mouth daily.        Marland Kitchen DISCONTD: nitroGLYCERIN (NITROSTAT) 0.4 MG SL tablet Place 0.4 mg under the tongue every 5 (five) minutes as needed.        . Ferrous Sulfate (IRON) 325 (65 FE) MG TABS Take 1 tablet by mouth daily.        . furosemide (LASIX) 20 MG tablet Take 1 tablet (20 mg total) by mouth daily.  30 tablet  11  . tiotropium (SPIRIVA) 18 MCG inhalation capsule Place 18 mcg into inhaler and inhale daily.           Review of Systems  Constitutional: Negative.   HENT: Negative.   Eyes: Negative.   Respiratory: Negative.   Cardiovascular: Positive for chest pain.  Gastrointestinal: Negative.   Musculoskeletal: Negative.   Skin: Negative.   Neurological: Negative.     Hematological: Negative.   Psychiatric/Behavioral: Negative.   All other systems reviewed and are negative.    BP 124/80  Pulse 68  Ht 6\' 1"  (1.854 m)  Wt 246 lb (111.585 kg)  BMI 32.46 kg/m2   Physical Exam  Nursing note and vitals reviewed. Constitutional: He is oriented to person, place, and time. He appears well-developed and well-nourished.  HENT:  Head: Normocephalic.  Nose: Nose normal.  Mouth/Throat: Oropharynx is clear and moist.  Eyes: Conjunctivae are normal. Pupils are equal, round, and reactive to light.  Neck: Normal range of motion. Neck supple. No JVD present.  Cardiovascular: Normal rate, regular rhythm, S1 normal, S2 normal, normal heart sounds and intact distal pulses.  Exam reveals no gallop and no friction rub.   No murmur heard. Pulmonary/Chest: Effort normal. No respiratory distress. He has decreased breath sounds. He has no wheezes. He has no rales. He exhibits no tenderness.  Abdominal: Soft. Bowel sounds are normal. He exhibits no distension. There is no tenderness.  Musculoskeletal: Normal range of motion. He exhibits no edema and no tenderness.  Lymphadenopathy:    He has no cervical adenopathy.  Neurological: He is alert and oriented to person, place, and time. Coordination normal.  Skin: Skin is warm and dry. No rash noted. No erythema.  Psychiatric: He has a normal mood and affect. His behavior is normal. Judgment and thought content normal.           Assessment and Plan

## 2011-06-19 NOTE — Patient Instructions (Addendum)
You are doing well. No medication changes were made.  Please take lasix as needed for shortness of breath or edema, bloating  Please call us if you have new issues that need to be addressed before your next appt.  The office will contact you for a follow up Appt. In 6 months

## 2011-06-20 ENCOUNTER — Encounter: Payer: Medicare Other | Admitting: Orthopaedic Surgery

## 2011-06-21 ENCOUNTER — Ambulatory Visit: Payer: Medicare Other | Admitting: Cardiovascular Disease

## 2011-06-25 ENCOUNTER — Ambulatory Visit: Payer: Medicare Other

## 2011-07-20 ENCOUNTER — Emergency Department: Payer: Medicare Other | Admitting: *Deleted

## 2011-07-29 ENCOUNTER — Encounter: Payer: Medicare Other | Admitting: Orthopaedic Surgery

## 2011-08-10 ENCOUNTER — Ambulatory Visit: Payer: Medicare Other

## 2011-08-12 ENCOUNTER — Inpatient Hospital Stay: Payer: Medicare Other | Admitting: Internal Medicine

## 2011-08-12 DIAGNOSIS — R0602 Shortness of breath: Secondary | ICD-10-CM

## 2011-08-12 DIAGNOSIS — I059 Rheumatic mitral valve disease, unspecified: Secondary | ICD-10-CM

## 2011-08-12 DIAGNOSIS — R05 Cough: Secondary | ICD-10-CM

## 2011-08-13 DIAGNOSIS — I214 Non-ST elevation (NSTEMI) myocardial infarction: Secondary | ICD-10-CM

## 2011-08-16 ENCOUNTER — Encounter: Payer: Medicare Other | Admitting: *Deleted

## 2011-08-17 DIAGNOSIS — I5033 Acute on chronic diastolic (congestive) heart failure: Secondary | ICD-10-CM

## 2011-08-20 LAB — COMPREHENSIVE METABOLIC PANEL
Anion Gap: 11 (ref 7–16)
Bilirubin,Total: 0.4 mg/dL (ref 0.2–1.0)
Calcium, Total: 8.4 mg/dL — ABNORMAL LOW (ref 8.5–10.1)
Chloride: 104 mmol/L (ref 98–107)
Co2: 26 mmol/L (ref 21–32)
EGFR (African American): >60
EGFR (Non-African Amer.): 58 — ABNORMAL LOW
Osmolality: 293 (ref 275–301)
Potassium: 4.3 mmol/L (ref 3.5–5.1)
SGOT(AST): 22 U/L (ref 15–37)
Sodium: 141 mmol/L (ref 136–145)
Total Protein: 6.5 g/dL (ref 6.4–8.2)

## 2011-08-20 LAB — TROPONIN I: Troponin-I: 0.04 ng/mL

## 2011-08-20 LAB — CBC
MCH: 31.3 pg (ref 26.0–34.0)
MCHC: 33.7 g/dL (ref 32.0–36.0)
MCV: 93 fL (ref 80–100)
Platelet: 170 10*3/uL (ref 150–440)
RBC: 4.24 10*6/uL — ABNORMAL LOW (ref 4.40–5.90)
WBC: 25.2 10*3/uL — ABNORMAL HIGH (ref 3.8–10.6)

## 2011-08-20 LAB — PRO B NATRIURETIC PEPTIDE: B-Type Natriuretic Peptide: 1960 pg/mL — ABNORMAL HIGH (ref 0–125)

## 2011-08-21 ENCOUNTER — Inpatient Hospital Stay: Payer: Self-pay | Admitting: Internal Medicine

## 2011-08-21 DIAGNOSIS — R079 Chest pain, unspecified: Secondary | ICD-10-CM

## 2011-08-21 LAB — CBC WITH DIFFERENTIAL/PLATELET
Basophil %: 0.2 %
Eosinophil %: 0 %
HCT: 38.9 % — ABNORMAL LOW (ref 40.0–52.0)
Lymphocyte #: 1.5 10*3/uL (ref 1.0–3.6)
Lymphocyte %: 7.8 %
MCV: 93 fL (ref 80–100)
Monocyte #: 0.2 10*3/uL (ref 0.0–0.7)
Monocyte %: 0.9 %
Platelet: 146 10*3/uL — ABNORMAL LOW (ref 150–440)
RBC: 4.19 10*6/uL — ABNORMAL LOW (ref 4.40–5.90)
WBC: 19.5 10*3/uL — ABNORMAL HIGH (ref 3.8–10.6)

## 2011-08-21 LAB — MAGNESIUM: Magnesium: 1.8 mg/dL

## 2011-08-21 LAB — BASIC METABOLIC PANEL
Anion Gap: 12 (ref 7–16)
BUN: 39 mg/dL — ABNORMAL HIGH (ref 7–18)
Creatinine: 1.17 mg/dL (ref 0.60–1.30)
EGFR (African American): >60
Glucose: 140 mg/dL — ABNORMAL HIGH (ref 65–99)

## 2011-08-21 LAB — CK TOTAL AND CKMB (NOT AT ARMC)
CK, Total: 20 U/L — ABNORMAL LOW (ref 35–232)
CK-MB: 1.7 ng/mL (ref 0.5–3.6)

## 2011-08-21 LAB — TROPONIN I: Troponin-I: <0.02 ng/mL

## 2011-08-22 ENCOUNTER — Encounter: Payer: Self-pay | Admitting: Orthopaedic Surgery

## 2011-09-27 ENCOUNTER — Telehealth: Payer: Self-pay

## 2011-09-27 MED ORDER — EZETIMIBE 10 MG PO TABS: 10.0000 mg | ORAL_TABLET | Freq: Every day | ORAL | Status: DC

## 2011-09-27 NOTE — Telephone Encounter (Signed)
Refill sent for zetia.  

## 2011-10-15 ENCOUNTER — Other Ambulatory Visit: Payer: Self-pay | Admitting: Cardiovascular Disease

## 2011-10-15 MED ORDER — ISOSORBIDE MONONITRATE ER 30 MG PO TB24: 30.0000 mg | ORAL_TABLET | Freq: Every day | ORAL | Status: DC

## 2011-10-15 NOTE — Telephone Encounter (Signed)
Refill sent for isosorbide 30 mg one tablet daily. 

## 2011-10-18 ENCOUNTER — Emergency Department: Payer: Self-pay | Admitting: Emergency Medicine

## 2011-10-18 ENCOUNTER — Ambulatory Visit: Payer: Self-pay

## 2011-10-18 LAB — COMPREHENSIVE METABOLIC PANEL
Albumin: 4.3 g/dL (ref 3.4–5.0)
BUN: 30 mg/dL — ABNORMAL HIGH (ref 7–18)
Calcium, Total: 8.8 mg/dL (ref 8.5–10.1)
Chloride: 105 mmol/L (ref 98–107)
Co2: 27 mmol/L (ref 21–32)
EGFR (African American): >60
Glucose: 104 mg/dL — ABNORMAL HIGH (ref 65–99)
Osmolality: 290 (ref 275–301)
Potassium: 4.6 mmol/L (ref 3.5–5.1)
SGOT(AST): 49 U/L — ABNORMAL HIGH (ref 15–37)
SGPT (ALT): 34 U/L
Total Protein: 8.2 g/dL (ref 6.4–8.2)

## 2011-10-18 LAB — CBC
HCT: 43.4 % (ref 40.0–52.0)
HGB: 14.5 g/dL (ref 13.0–18.0)
MCH: 30.8 pg (ref 26.0–34.0)
MCHC: 33.4 g/dL (ref 32.0–36.0)
MCV: 92 fL (ref 80–100)
RBC: 4.71 10*6/uL (ref 4.40–5.90)
RDW: 14.1 % (ref 11.5–14.5)

## 2011-10-24 ENCOUNTER — Ambulatory Visit: Payer: Self-pay | Admitting: Specialist

## 2011-11-16 LAB — TROPONIN I: Troponin-I: <0.02 ng/mL

## 2011-11-16 LAB — COMPREHENSIVE METABOLIC PANEL
Albumin: 3.5 g/dL (ref 3.4–5.0)
BUN: 23 mg/dL — ABNORMAL HIGH (ref 7–18)
Bilirubin,Total: 0.3 mg/dL (ref 0.2–1.0)
Calcium, Total: 8.2 mg/dL — ABNORMAL LOW (ref 8.5–10.1)
Chloride: 108 mmol/L — ABNORMAL HIGH (ref 98–107)
EGFR (African American): >60
EGFR (Non-African Amer.): >60
Glucose: 121 mg/dL — ABNORMAL HIGH (ref 65–99)
Osmolality: 290 (ref 275–301)
SGOT(AST): 47 U/L — ABNORMAL HIGH (ref 15–37)
SGPT (ALT): 32 U/L

## 2011-11-16 LAB — CBC
HCT: 37.5 % — ABNORMAL LOW (ref 40.0–52.0)
HGB: 12.4 g/dL — ABNORMAL LOW (ref 13.0–18.0)
Platelet: 201 10*3/uL (ref 150–440)
RDW: 13.8 % (ref 11.5–14.5)
WBC: 11.8 10*3/uL — ABNORMAL HIGH (ref 3.8–10.6)

## 2011-11-17 ENCOUNTER — Inpatient Hospital Stay: Payer: Self-pay | Admitting: Specialist

## 2011-11-17 LAB — CK TOTAL AND CKMB (NOT AT ARMC)
CK, Total: 25 U/L — ABNORMAL LOW (ref 35–232)
CK-MB: 1.1 ng/mL (ref 0.5–3.6)
CK-MB: 0.8 ng/mL (ref 0.5–3.6)

## 2011-11-17 LAB — TROPONIN I: Troponin-I: <0.02 ng/mL

## 2011-11-17 LAB — THEOPHYLLINE LEVEL: Theophylline: 2.4 ug/mL — ABNORMAL LOW (ref 10.0–20.0)

## 2011-11-18 LAB — CBC WITH DIFFERENTIAL/PLATELET
Basophil #: 0 10*3/uL (ref 0.0–0.1)
Basophil %: 0.1 %
Eosinophil #: 0 10*3/uL (ref 0.0–0.7)
Lymphocyte #: 1.3 10*3/uL (ref 1.0–3.6)
MCHC: 32.6 g/dL (ref 32.0–36.0)
MCV: 93 fL (ref 80–100)
Neutrophil #: 12.9 10*3/uL — ABNORMAL HIGH (ref 1.4–6.5)
Neutrophil %: 88.3 %
RDW: 13.6 % (ref 11.5–14.5)
WBC: 14.6 10*3/uL — ABNORMAL HIGH (ref 3.8–10.6)

## 2011-11-20 ENCOUNTER — Ambulatory Visit: Payer: Medicare Other | Admitting: Cardiovascular Disease

## 2011-12-03 ENCOUNTER — Ambulatory Visit: Payer: Medicare Other | Admitting: Cardiovascular Disease

## 2011-12-30 ENCOUNTER — Encounter: Payer: Self-pay | Admitting: Cardiovascular Disease

## 2011-12-30 ENCOUNTER — Ambulatory Visit (INDEPENDENT_AMBULATORY_CARE_PROVIDER_SITE_OTHER): Payer: Medicare Other | Admitting: Cardiovascular Disease

## 2011-12-30 VITALS — BP 131/86 | HR 81 | Ht 73.0 in | Wt 230.0 lb

## 2011-12-30 DIAGNOSIS — I25810 Atherosclerosis of coronary artery bypass graft(s) without angina pectoris: Secondary | ICD-10-CM

## 2011-12-30 DIAGNOSIS — IMO0001 Reserved for inherently not codable concepts without codable children: Secondary | ICD-10-CM

## 2011-12-30 DIAGNOSIS — I6529 Occlusion and stenosis of unspecified carotid artery: Secondary | ICD-10-CM

## 2011-12-30 DIAGNOSIS — E785 Hyperlipidemia, unspecified: Secondary | ICD-10-CM

## 2011-12-30 DIAGNOSIS — R5381 Other malaise: Secondary | ICD-10-CM

## 2011-12-30 DIAGNOSIS — J449 Chronic obstructive pulmonary disease, unspecified: Secondary | ICD-10-CM

## 2011-12-30 DIAGNOSIS — R5383 Other fatigue: Secondary | ICD-10-CM

## 2011-12-30 DIAGNOSIS — R61 Generalized hyperhidrosis: Secondary | ICD-10-CM

## 2011-12-30 NOTE — Assessment & Plan Note (Signed)
He appears to be doing well on chronic prednisone. He had a very challenging winter.

## 2011-12-30 NOTE — Progress Notes (Signed)
Patient ID: Javier [name], male    DOB: 05-24-1936, 76 y.o.   MRN: [id number]  HPI Comments: 76 year old male with history of smoking 40 years, severe asymmetric LVH with moderate LVOT gradient,  COPD, coronary artery disease with bypass surgery in 2007, occlusion of 2 vein grafts with PCI x4, peripheral vascular disease with bilateral carotid endarterectomies in 2002, history of TIA in 2002 who presented several times to [facility] in September 2012 with COPD exacerbation,  TIA, episodse of COPD exacerbation/bronchitis also with episode of shortness of breath and chest pain that took him to [facility] where he had a cardiac catheterization showing patent vessels with no significant progression of his disease, who presents for routine followup . He was Previously found to be resistant to Plavix by P2 Y. 12 assay  at [facility]  He reports having episodes of COPD exacerbation through the end of 2012, into 2013. He has had intramuscular shots of high-dose steroids and has been maintained on prednisone 20 mg daily with recent slow taper. He reports that his breathing has been well controlled. He is under the care of Dr. Meredeth Ide. He does report having significant sweating over the past month. Episodes come on at rest, occasionally with activity. He has had more fatigue when he has these episodes. Episodes occur once a day, possibly every other day. He did have a viral gastroenteritis several weeks ago, this has since resolved. He denies any new medications, no significant coughing or sputum production. No dysuria.  hospital records indicate that he has asymmetric LVH that is severe, diastolic relaxation abnormality, normal systolic function, mild MR significant chordal SAM with LVOT gradient of 3 m/s and gradient of 35 mm of mercury and increases to gradient of 49 with Valsalva  EKG shows normal sinus rhythm with rate of 87 beats per minute, no significant ST or T wave changes    Outpatient Encounter Prescriptions  as of 12/30/2011  Medication Sig Dispense Refill  . albuterol-ipratropium (COMBIVENT) 18-103 MCG/ACT inhaler Inhale 2 puffs into the lungs every 6 (six) hours as needed.       . ALPRAZolam (XANAX) 0.25 MG tablet Take 0.25 mg by mouth at bedtime as needed.      . Ascorbic Acid (VITAMIN C) 500 MG tablet Take 1,000 mg by mouth daily.       Marland Kitchen aspirin 81 MG EC tablet Take 162 mg by mouth daily.       Marland Kitchen atorvastatin (LIPITOR) 40 MG tablet Take 40 mg by mouth daily.        . benzonatate (TESSALON) 100 MG capsule Take 100 mg by mouth 3 (three) times daily as needed.      . budesonide-formoterol (SYMBICORT) 160-4.5 MCG/ACT inhaler Inhale 2 puffs into the lungs 2 (two) times daily. 2 puffs daily      . calcium-vitamin D (OSCAL WITH D) 500-200 MG-UNIT per tablet Take 1 tablet by mouth daily.        Marland Kitchen CARVEDILOL PO Take 6.15 mg by mouth daily. Takes 1 tablet in the morning and 1 tablet in the evening.      Jennette Banker Sodium 30-100 MG CAPS Take 100 mg by mouth daily. 1 tablet in the morning 1 tablet in the evening.      . ezetimibe (ZETIA) 10 MG tablet Take 5 mg by mouth daily.      Marland Kitchen HYDROcodone-acetaminophen (LORTAB) 7.5-500 MG/15ML solution Take by mouth every 8 (eight) hours as needed.      Marland Kitchen ipratropium-albuterol (DUONEB)  0.5-2.5 (3) MG/3ML SOLN Take 3 mLs by nebulization 4 (four) times daily.        . isosorbide mononitrate (IMDUR) 30 MG 24 hr tablet Take 1 tablet (30 mg total) by mouth daily.  30 tablet  6  . leuprolide, 6 Month, (ELIGARD) 45 MG injection Inject 45 mg into the skin every 6 (six) months.      . Multiple Vitamins-Minerals (CENTRUM SILVER ULTRA MENS PO) Take 1 tablet by mouth daily.        . nitroGLYCERIN (NITROSTAT) 0.4 MG SL tablet Place 1 tablet (0.4 mg total) under the tongue every 5 (five) minutes as needed.  25 tablet  11  . Omega-3 Fatty Acids (FISH OIL) 300 MG CAPS Take 1 capsule by mouth 2 (two) times daily.        . predniSONE (DELTASONE) 20 MG tablet Take 20 mg by  mouth daily.      Marland Kitchen tiotropium (SPIRIVA) 18 MCG inhalation capsule Place 18 mcg into inhaler and inhale daily.        . traMADol-acetaminophen (ULTRACET) 37.5-325 MG per tablet Take 1 tablet by mouth daily as needed.        Review of Systems  Constitutional: Negative.        Episodes of sweating  HENT: Negative.   Eyes: Negative.   Respiratory: Negative.   Gastrointestinal: Negative.   Musculoskeletal: Negative.   Skin: Negative.   Neurological: Negative.   Hematological: Negative.   Psychiatric/Behavioral: Negative.   All other systems reviewed and are negative.    BP 131/86  Pulse 81  Ht 6\' 1"  (1.854 m)  Wt 230 lb (104.327 kg)  BMI 30.34 kg/m2  Physical Exam  Nursing note and vitals reviewed. Constitutional: He is oriented to person, place, and time. He appears well-developed and well-nourished.  HENT:  Head: Normocephalic.  Nose: Nose normal.  Mouth/Throat: Oropharynx is clear and moist.  Eyes: Conjunctivae are normal. Pupils are equal, round, and reactive to light.  Neck: Normal range of motion. Neck supple. No JVD present.  Cardiovascular: Normal rate, regular rhythm, S1 normal, S2 normal and intact distal pulses.  Exam reveals no gallop and no friction rub.   Murmur heard.  Crescendo systolic murmur is present with a grade of 2/6  Pulmonary/Chest: Effort normal. No respiratory distress. He has decreased breath sounds. He has no wheezes. He has rales. He exhibits no tenderness.  Abdominal: Soft. Bowel sounds are normal. He exhibits no distension. There is no tenderness.  Musculoskeletal: Normal range of motion. He exhibits no edema and no tenderness.  Lymphadenopathy:    He has no cervical adenopathy.  Neurological: He is alert and oriented to person, place, and time. Coordination normal.  Skin: Skin is warm and dry. No rash noted. No erythema.  Psychiatric: He has a normal mood and affect. His behavior is normal. Judgment and thought content normal.            Assessment and Plan

## 2011-12-30 NOTE — Assessment & Plan Note (Signed)
Etiology of his sweating episodes that were sent at rest, sometimes with exertion, is uncertain. Does not appear to be cardiac in nature given the symptoms at rest. We have ordered some routine blood work today including TSH, sedimentation rate, CBC, basic metabolic panel. We have suggested he have followup with his primary care physician and pulmonary.

## 2011-12-30 NOTE — Assessment & Plan Note (Signed)
Currently with no symptoms of angina. No further workup at this time. Continue current medication regimen. 

## 2011-12-30 NOTE — Assessment & Plan Note (Signed)
We have suggested that he stay on his current cholesterol medication. Goal LDL less than 70.

## 2011-12-30 NOTE — Assessment & Plan Note (Signed)
Recommended we continue aggressive cholesterol management and.

## 2011-12-30 NOTE — Patient Instructions (Addendum)
You are doing well. No medication changes were made.  We will check some labs today to look for a cause of your sweating  Please call us if you have new issues that need to be addressed before your next appt.  Your physician wants you to follow-up in: 6 months.  You will receive a reminder letter in the mail two months in advance. If you don't receive a letter, please call our office to schedule the follow-up appointment.

## 2011-12-31 ENCOUNTER — Telehealth: Payer: Self-pay | Admitting: Cardiovascular Disease

## 2011-12-31 LAB — CBC WITH DIFFERENTIAL/PLATELET
Basos: 0 % (ref 0–3)
Eosinophils Absolute: 0.1 10*3/uL (ref 0.0–0.4)
Hemoglobin: 13.7 g/dL (ref 12.6–17.7)
MCH: 29.8 pg (ref 26.6–33.0)
MCV: 90 fL (ref 79–97)
Monocytes Absolute: 0.3 10*3/uL (ref 0.1–1.0)
Neutrophils Absolute: 12.8 10*3/uL — ABNORMAL HIGH (ref 1.8–7.8)
Neutrophils Relative %: 87 % — ABNORMAL HIGH (ref 40–74)
RBC: 4.6 x10E6/uL (ref 4.14–5.80)

## 2011-12-31 LAB — BASIC METABOLIC PANEL WITH GFR
BUN/Creatinine Ratio: 22 (ref 10–22)
BUN: 25 mg/dL (ref 8–27)
CO2: 17 mmol/L — ABNORMAL LOW (ref 20–32)
Calcium: 9 mg/dL (ref 8.6–10.2)
Chloride: 105 mmol/L (ref 97–108)
Creatinine, Ser: 1.12 mg/dL (ref 0.76–1.27)
GFR calc Af Amer: 75 mL/min/1.73 (ref >59)
GFR calc non Af Amer: 65 mL/min/1.73 (ref >59)
Glucose: 115 mg/dL — ABNORMAL HIGH (ref 65–99)
Potassium: 5.2 mmol/L (ref 3.5–5.2)
Sodium: 139 mmol/L (ref 134–144)

## 2011-12-31 LAB — TSH: TSH: 0.625 u[IU]/mL (ref 0.450–4.500)

## 2011-12-31 NOTE — Telephone Encounter (Signed)
Uncertain if sweating episodes is causing high blood pressure or IF high blood pressure is causing sweating. Suspect there is something else going on Lab work done yesterday is still pending Would continue to monitor blood pressure If this runs high at baseline when there is no sweating, I would start amlodipine 5 mg daily I would also call primary care physician to see whoever is covering for his regular doctor

## 2011-12-31 NOTE — Telephone Encounter (Signed)
Javier [name] started sweating profusely last night while watching tv around 6:30-6:45. He stopped sweating around 11:00 pm.  No other symptoms.  He denies sob or pain.  BP readings are as documented below.  Please advise.

## 2011-12-31 NOTE — Telephone Encounter (Signed)
N/A.  Message states call is blocked.

## 2011-12-31 NOTE — Telephone Encounter (Signed)
Pt wife called stating that pt has been problems with a lot of sweating. Pt starting sweating around 6:30 6:45 and lasted til around 11:00.   182/91 7:00 pm 190/90 9:00 pm 150/82 11:00 pm 149/79 12:30 140/70 4:00am

## 2011-12-31 NOTE — Telephone Encounter (Signed)
Pt was notified and agrees.  He will call back after checking his blood pressure for a while.

## 2012-01-08 ENCOUNTER — Telehealth: Payer: Self-pay

## 2012-01-08 NOTE — Telephone Encounter (Signed)
Received call from patient's wife,lab results given.Wife stated husband already saw PCP Dr.Quinn.Stated Dr.Quinn has copy of lab.Also copy of lab will be faxed to lung doctor Dr.Fleming.

## 2012-01-12 ENCOUNTER — Ambulatory Visit: Payer: Self-pay | Admitting: Orthopaedic Surgery

## 2012-01-12 ENCOUNTER — Inpatient Hospital Stay: Payer: Self-pay | Admitting: Internal Medicine

## 2012-01-12 LAB — COMPREHENSIVE METABOLIC PANEL
Alkaline Phosphatase: 45 U/L — ABNORMAL LOW (ref 50–136)
Anion Gap: 9 (ref 7–16)
BUN: 32 mg/dL — ABNORMAL HIGH (ref 7–18)
Bilirubin,Total: 0.4 mg/dL (ref 0.2–1.0)
Calcium, Total: 8 mg/dL — ABNORMAL LOW (ref 8.5–10.1)
EGFR (African American): >60
EGFR (Non-African Amer.): >60
Glucose: 122 mg/dL — ABNORMAL HIGH (ref 65–99)
Osmolality: 289 (ref 275–301)
Potassium: 4.2 mmol/L (ref 3.5–5.1)
SGOT(AST): 21 U/L (ref 15–37)
Sodium: 141 mmol/L (ref 136–145)
Total Protein: 6.8 g/dL (ref 6.4–8.2)

## 2012-01-12 LAB — URINALYSIS, COMPLETE
Glucose,UR: NEGATIVE mg/dL (ref 0–75)
Leukocyte Esterase: NEGATIVE
Nitrite: NEGATIVE
RBC,UR: NONE SEEN /HPF (ref 0–5)
Specific Gravity: 1.025 (ref 1.003–1.030)
WBC UR: 1 /HPF (ref 0–5)

## 2012-01-12 LAB — CBC
HGB: 12.3 g/dL — ABNORMAL LOW (ref 13.0–18.0)
MCHC: 33 g/dL (ref 32.0–36.0)
MCV: 90 fL (ref 80–100)
RDW: 14.3 % (ref 11.5–14.5)
WBC: 15.9 10*3/uL — ABNORMAL HIGH (ref 3.8–10.6)

## 2012-01-12 LAB — CK TOTAL AND CKMB (NOT AT ARMC)
CK, Total: 34 U/L — ABNORMAL LOW (ref 35–232)
CK-MB: 0.8 ng/mL (ref 0.5–3.6)

## 2012-01-12 LAB — TROPONIN I: Troponin-I: <0.02 ng/mL

## 2012-01-13 DIAGNOSIS — I059 Rheumatic mitral valve disease, unspecified: Secondary | ICD-10-CM

## 2012-01-13 DIAGNOSIS — R55 Syncope and collapse: Secondary | ICD-10-CM

## 2012-01-13 DIAGNOSIS — R0602 Shortness of breath: Secondary | ICD-10-CM

## 2012-01-13 LAB — CBC WITH DIFFERENTIAL/PLATELET
Basophil #: 0.1 10*3/uL (ref 0.0–0.1)
Basophil %: 0.4 %
HCT: 36.5 % — ABNORMAL LOW (ref 40.0–52.0)
Lymphocyte %: 12.8 %
MCV: 91 fL (ref 80–100)
Monocyte #: 0.9 x10 3/mm (ref 0.2–1.0)
Monocyte %: 5.1 %
Neutrophil #: 14.8 10*3/uL — ABNORMAL HIGH (ref 1.4–6.5)
Neutrophil %: 81.6 %
Platelet: 179 10*3/uL (ref 150–440)

## 2012-01-13 LAB — BASIC METABOLIC PANEL
Anion Gap: 10 (ref 7–16)
Calcium, Total: 8 mg/dL — ABNORMAL LOW (ref 8.5–10.1)
Co2: 25 mmol/L (ref 21–32)
EGFR (African American): >60
EGFR (Non-African Amer.): >60
Sodium: 141 mmol/L (ref 136–145)

## 2012-01-13 LAB — TROPONIN I
Troponin-I: <0.02 ng/mL
Troponin-I: <0.02 ng/mL

## 2012-01-13 LAB — CK-MB: CK-MB: 0.8 ng/mL (ref 0.5–3.6)

## 2012-01-14 LAB — BASIC METABOLIC PANEL
Anion Gap: 9 (ref 7–16)
BUN: 21 mg/dL — ABNORMAL HIGH (ref 7–18)
Creatinine: 1.2 mg/dL (ref 0.60–1.30)
Potassium: 4.3 mmol/L (ref 3.5–5.1)

## 2012-01-14 LAB — CBC WITH DIFFERENTIAL/PLATELET
Basophil #: 0.1 10*3/uL (ref 0.0–0.1)
Eosinophil %: 0 %
Lymphocyte #: 1.1 10*3/uL (ref 1.0–3.6)
Lymphocyte %: 10 %
MCHC: 33.2 g/dL (ref 32.0–36.0)
MCV: 90 fL (ref 80–100)
Monocyte #: 0.2 x10 3/mm (ref 0.2–1.0)
Monocyte %: 1.8 %
Neutrophil #: 9.2 10*3/uL — ABNORMAL HIGH (ref 1.4–6.5)
Neutrophil %: 87.4 %
Platelet: 189 10*3/uL (ref 150–440)
RBC: 4.23 10*6/uL — ABNORMAL LOW (ref 4.40–5.90)
WBC: 10.5 10*3/uL (ref 3.8–10.6)

## 2012-01-15 NOTE — Progress Notes (Signed)
LMTCB

## 2012-01-20 ENCOUNTER — Telehealth: Payer: Self-pay | Admitting: Cardiovascular Disease

## 2012-01-20 ENCOUNTER — Emergency Department: Payer: Self-pay | Admitting: Emergency Medicine

## 2012-01-20 LAB — CBC WITH DIFFERENTIAL/PLATELET
Eosinophil #: 0 10*3/uL (ref 0.0–0.7)
HCT: 36.6 % — ABNORMAL LOW (ref 40.0–52.0)
Lymphocyte %: 5.8 %
MCV: 90 fL (ref 80–100)
Monocyte %: 3.2 %
Neutrophil #: 12.6 10*3/uL — ABNORMAL HIGH (ref 1.4–6.5)
Neutrophil %: 90.6 %
Platelet: 161 10*3/uL (ref 150–440)
RBC: 4.07 10*6/uL — ABNORMAL LOW (ref 4.40–5.90)
WBC: 13.9 10*3/uL — ABNORMAL HIGH (ref 3.8–10.6)

## 2012-01-20 LAB — URINALYSIS, COMPLETE
Bacteria: NONE SEEN
Blood: NEGATIVE
Glucose,UR: NEGATIVE mg/dL (ref 0–75)
Nitrite: NEGATIVE
Ph: 5 (ref 4.5–8.0)
Protein: NEGATIVE
Specific Gravity: 1.008 (ref 1.003–1.030)

## 2012-01-20 LAB — BASIC METABOLIC PANEL
BUN: 32 mg/dL — ABNORMAL HIGH (ref 7–18)
Calcium, Total: 7.7 mg/dL — ABNORMAL LOW (ref 8.5–10.1)
Co2: 28 mmol/L (ref 21–32)
EGFR (African American): >60
Glucose: 154 mg/dL — ABNORMAL HIGH (ref 65–99)
Osmolality: 288 (ref 275–301)
Potassium: 4.9 mmol/L (ref 3.5–5.1)
Sodium: 139 mmol/L (ref 136–145)

## 2012-01-20 NOTE — Telephone Encounter (Signed)
FYI  Pt going to ER via ambulance at wife's discretion. See below.

## 2012-01-20 NOTE — Telephone Encounter (Signed)
See below

## 2012-01-20 NOTE — Telephone Encounter (Signed)
I called wife back. She says she has already called 911 for pt. Says he has severe edema that is up to his thigh and is c/o severe calf pain.  He is in ambulance now in driveway. She is on her way with him.  I told her I would let Dr. Mariah Milling know.

## 2012-01-20 NOTE — Telephone Encounter (Signed)
Patient's wife [place] called stating that since Prince's discharge from the hospital last week his legs, feet, and hands are very swollen.  He is also short of breath more than usual.  He is currently taking 20mg  lasix one a day.  Would like to speak with RN

## 2012-01-21 NOTE — Telephone Encounter (Signed)
Pt wife called wanted to see if Dr Mariah Milling would write a note stating that pt cant go on his trip so they could get money back for flight. I informed pt that I was unsure if Dr Mariah Milling could do this but would send note to nurse.

## 2012-01-21 NOTE — Telephone Encounter (Signed)
Wife says pt was d/c from ER last night. Says lasix dose was increased to 40 mg daily.  Edema has improved slightly. Wife says he has appt with Dr. Mariah Milling 6/17 but feels pt needs to be seen sooner. She says she called here earlier and was told we do not have any appts avail for this week.  I looked and noticed we had some cancellations and scheduled pt to be seen 6/6 at 2:45. Wife verb. Understanding.  Wife also states she and husband were to fly to [place] this Friday 6/7. She asks if we can write a letter re: pt's ailing health and inability to fly/go on this trip.  She says they would like to try to get reimbursed. I told her I would type up a letter to see if this helps.  She will get this Thurs when they come for appt.

## 2012-01-23 ENCOUNTER — Ambulatory Visit (INDEPENDENT_AMBULATORY_CARE_PROVIDER_SITE_OTHER): Payer: Medicare Other | Admitting: Cardiovascular Disease

## 2012-01-23 ENCOUNTER — Encounter: Payer: Self-pay | Admitting: Cardiovascular Disease

## 2012-01-23 VITALS — BP 110/60 | HR 72 | Ht 73.0 in | Wt 237.2 lb

## 2012-01-23 DIAGNOSIS — R531 Weakness: Secondary | ICD-10-CM | POA: Insufficient documentation

## 2012-01-23 DIAGNOSIS — R0602 Shortness of breath: Secondary | ICD-10-CM

## 2012-01-23 DIAGNOSIS — R079 Chest pain, unspecified: Secondary | ICD-10-CM

## 2012-01-23 DIAGNOSIS — J449 Chronic obstructive pulmonary disease, unspecified: Secondary | ICD-10-CM

## 2012-01-23 DIAGNOSIS — R609 Edema, unspecified: Secondary | ICD-10-CM

## 2012-01-23 DIAGNOSIS — E785 Hyperlipidemia, unspecified: Secondary | ICD-10-CM

## 2012-01-23 DIAGNOSIS — I25810 Atherosclerosis of coronary artery bypass graft(s) without angina pectoris: Secondary | ICD-10-CM

## 2012-01-23 DIAGNOSIS — R5383 Other fatigue: Secondary | ICD-10-CM

## 2012-01-23 NOTE — Assessment & Plan Note (Addendum)
He is very deconditioned after his recent hospital stay. We will start home PT evaluation and treatment if needed. Pulmonary rehabilitation scheduled in 2 weeks. Heart rate is slowed today possibly contributing to weakness and fatigue. We will decrease his Coreg to 6.25 mg twice a day. I am actually surprised that he was started on a beta blocker in the hospital recently in the setting of COPD exacerbation. I would have preferred bystolic.

## 2012-01-23 NOTE — Assessment & Plan Note (Signed)
Minimal edema on today's visit. He reports having significant edema after his recent hospital visit where he received IV fluids. I suspect he had mild pulmonary hypertension. Echocardiogram showing normal pressures was done on arrival prior to significant IV fluids. He has felt better on higher dose Lasix recently. We have suggested he continue Lasix high dose 40 mg until symptoms have improved and then decrease the dose back to 20 mg daily.

## 2012-01-23 NOTE — Assessment & Plan Note (Signed)
Currently with no symptoms of angina. No further workup at this time. Continue current medication regimen. Recent negative stress test. 

## 2012-01-23 NOTE — Assessment & Plan Note (Signed)
Continue statin with zetia.

## 2012-01-23 NOTE — Progress Notes (Signed)
Patient ID: Javier [name], male    DOB: 04/05/1936, 76 y.o.   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,  COPD, coronary artery disease with bypass surgery in 2007, occlusion of 2 vein grafts with PCI x4, peripheral vascular disease with bilateral carotid endarterectomies in 2002, history of TIA in 2002 who presented several times to [facility] in September 2012 with COPD exacerbation,  TIA, episodse of COPD exacerbation/bronchitis also with episode of shortness of breath and chest pain that took him to [facility] where he had a cardiac catheterization showing patent vessels with no significant progression of his disease, who presents for routine followup . He was Previously found to be resistant to Plavix by P2 Y. 12 assay  at [facility]  He reports having episodes of COPD exacerbation through the end of 2012, into 2013. He has had intramuscular shots of high-dose steroids and has been maintained on prednisone 20 mg daily with recent slow taper. He reports that his breathing has been well controlled. He is under the care of Dr. Meredeth Ide. He does report having significant sweating over the past month. Episodes come on at rest, occasionally with activity. He has had more fatigue when he has these episodes. Episodes occur once a day, possibly every other day. He did have a viral gastroenteritis several weeks ago, this has since resolved. He denies any new medications, no significant coughing or sputum production. No dysuria.  hospital records indicate that he has asymmetric LVH that is severe, diastolic relaxation abnormality, normal systolic function, mild MR significant chordal SAM with LVOT gradient of 3 m/s and gradient of 35 mm of mercury and increases to gradient of 49 with Valsalva  He had a recent hospitalization in May 2013 chest pain, COPD exacerbation. Stress test showed no ischemia. Echocardiogram was essentially  normal with mild MR. He reports having significant IV fluids through his hospital course and had edema at the time of discharge. Edema got worse through the next week with significant pain in his legs. His legs are weak and he feels very deconditioned. He has been taking Lasix 40 mg daily with no significant urination and continued leg pain with mild swelling. He scheduled for pulmonary rehabilitation June 18. While in the hospital he was started on clonidine 0.2 mg twice a day and Coreg 12.5 mg twice a day.  Carotid ultrasound done in the hospital 01/12/2012 shows no significant carotid disease  EKG shows normal sinus rhythm with rate of 72 beats per minute, no significant ST or T wave changes    Outpatient Encounter Prescriptions as of 01/23/2012  Medication Sig Dispense Refill  . albuterol-ipratropium (COMBIVENT) 18-103 MCG/ACT inhaler Inhale 2 puffs into the lungs every 6 (six) hours as needed.       . ALPRAZolam (XANAX) 0.25 MG tablet Take 0.25 mg by mouth at bedtime as needed.      Marland Kitchen amLODipine (NORVASC) 10 MG tablet Take 10 mg by mouth daily.      . Ascorbic Acid (VITAMIN C) 500 MG tablet Take 1,000 mg by mouth daily.       Marland Kitchen aspirin 81 MG EC tablet Take 162 mg by mouth daily.       Marland Kitchen atorvastatin (LIPITOR) 40 MG tablet Take 40 mg by mouth daily.        . budesonide-formoterol (SYMBICORT) 160-4.5 MCG/ACT inhaler Inhale 2 puffs into the lungs 2 (two) times daily. 2 puffs daily      .  calcium-vitamin D (OSCAL WITH D) 500-200 MG-UNIT per tablet Take 1 tablet by mouth 2 (two) times daily.       . carvedilol (COREG) 6.25 MG tablet Take 1 tablet (6.25 mg total) by mouth 2 (two) times daily with a meal.      . cloNIDine (CATAPRES) 0.2 MG tablet Take 0.2 mg by mouth 2 (two) times daily.      . Ezetimibe (ZETIA PO) Take 5 mg by mouth daily.      . furosemide (LASIX) 20 MG tablet 20 mg as needed.      Marland Kitchen guaiFENesin (MUCINEX) 600 MG 12 hr tablet Take 1,200 mg by mouth 2 (two) times daily as needed.       Marland Kitchen HYDROcodone-acetaminophen (LORTAB) 7.5-500 MG/15ML solution Take by mouth every 12 (twelve) hours as needed.       Marland Kitchen ipratropium-albuterol (DUONEB) 0.5-2.5 (3) MG/3ML SOLN Take 3 mLs by nebulization as needed.       . isosorbide mononitrate (IMDUR) 30 MG 24 hr tablet 30 mg. Takes 2 tablets daily.      Marland Kitchen leuprolide, 6 Month, (ELIGARD) 45 MG injection Inject 45 mg into the skin every 6 (six) months.      . Multiple Vitamins-Minerals (CENTRUM SILVER ULTRA MENS PO) Take 1 tablet by mouth daily.        . nitroGLYCERIN (NITROSTAT) 0.4 MG SL tablet Place 1 tablet (0.4 mg total) under the tongue every 5 (five) minutes as needed.  25 tablet  11  . NON FORMULARY 100 mg 2 (two) times daily. Stool softner      . Omega-3 Fatty Acids (FISH OIL) 300 MG CAPS Take 1 capsule by mouth 2 (two) times daily.        Marland Kitchen omeprazole (PRILOSEC) 20 MG capsule Take 20 mg by mouth daily.      Marland Kitchen PREDNISONE PO Taper.      . tiotropium (SPIRIVA) 18 MCG inhalation capsule Place 18 mcg into inhaler and inhale daily.        . traMADol-acetaminophen (ULTRACET) 37.5-325 MG per tablet Take 1 tablet by mouth daily as needed.       Review of Systems  Constitutional: Positive for fatigue.       Episodes of sweating  HENT: Negative.   Eyes: Negative.   Respiratory: Positive for cough and shortness of breath.   Gastrointestinal: Negative.   Musculoskeletal: Negative.   Skin: Negative.   Neurological: Positive for weakness.  Hematological: Negative.   Psychiatric/Behavioral: Negative.   All other systems reviewed and are negative.    BP 100/60  Pulse 72  Ht 6\' 1"  (1.854 m)  Wt 237 lb 4 oz (107.616 kg)  BMI 31.30 kg/m2  SpO2 98%  Physical Exam  Nursing note and vitals reviewed. Constitutional: He is oriented to person, place, and time. He appears well-developed and well-nourished.  HENT:  Head: Normocephalic.  Nose: Nose normal.  Mouth/Throat: Oropharynx is clear and moist.  Eyes: Conjunctivae are normal. Pupils  are equal, round, and reactive to light.  Neck: Normal range of motion. Neck supple. No JVD present.  Cardiovascular: Normal rate, regular rhythm, S1 normal, S2 normal and intact distal pulses.  Exam reveals no gallop and no friction rub.   Murmur heard.  Crescendo systolic murmur is present with a grade of 2/6       Trace pitting above the sock line bilaterally  Pulmonary/Chest: Effort normal. No respiratory distress. He has decreased breath sounds. He has no wheezes. He has rales. He  exhibits no tenderness.  Abdominal: Soft. Bowel sounds are normal. He exhibits no distension. There is no tenderness.  Musculoskeletal: Normal range of motion. He exhibits no edema and no tenderness.  Lymphadenopathy:    He has no cervical adenopathy.  Neurological: He is alert and oriented to person, place, and time. Coordination normal.  Skin: Skin is warm and dry. No rash noted. No erythema.  Psychiatric: He has a normal mood and affect. His behavior is normal. Judgment and thought content normal.           Assessment and Plan

## 2012-01-23 NOTE — Patient Instructions (Signed)
You are doing well. Please decrease the coreg to 6.25 mg twice a day (not 12.5 mg) Drink less, ok to increase lasix to 60 mg as needed for edema  Monitor your blood pressure If it runs low, call the office  Please call us if you have new issues that need to be addressed before your next appt.  Your physician wants you to follow-up in: 3 months.  You will receive a reminder letter in the mail two months in advance. If you don't receive a letter, please call our office to schedule the follow-up appointment.

## 2012-01-23 NOTE — Assessment & Plan Note (Signed)
Recent COPD exacerbation. He has followup with Dr. Meredeth Ide.  Antibiotics are complete and he continues on his prednisone taper.

## 2012-01-28 ENCOUNTER — Encounter: Payer: Self-pay | Admitting: Specialist

## 2012-02-03 ENCOUNTER — Encounter: Payer: Medicare Other | Admitting: Cardiovascular Disease

## 2012-02-14 ENCOUNTER — Encounter: Payer: Self-pay | Admitting: Cardiovascular Disease

## 2012-02-14 ENCOUNTER — Ambulatory Visit (INDEPENDENT_AMBULATORY_CARE_PROVIDER_SITE_OTHER): Payer: Medicare Other | Admitting: Cardiovascular Disease

## 2012-02-14 VITALS — BP 120/77 | HR 91 | Ht 72.0 in | Wt 234.0 lb

## 2012-02-14 DIAGNOSIS — J449 Chronic obstructive pulmonary disease, unspecified: Secondary | ICD-10-CM

## 2012-02-14 DIAGNOSIS — I10 Essential (primary) hypertension: Secondary | ICD-10-CM | POA: Insufficient documentation

## 2012-02-14 DIAGNOSIS — I25810 Atherosclerosis of coronary artery bypass graft(s) without angina pectoris: Secondary | ICD-10-CM

## 2012-02-14 DIAGNOSIS — E785 Hyperlipidemia, unspecified: Secondary | ICD-10-CM

## 2012-02-14 MED ORDER — AMLODIPINE BESYLATE 10 MG PO TABS: 10.0000 mg | ORAL_TABLET | Freq: Every day | ORAL | Status: DC

## 2012-02-14 MED ORDER — CLONIDINE HCL 0.1 MG PO TABS: 0.1000 mg | ORAL_TABLET | Freq: Two times a day (BID) | ORAL | Status: DC

## 2012-02-14 NOTE — Assessment & Plan Note (Signed)
Recent episodes of low blood pressure. I suspect he was dehydrated. He was sitting in a hot environment the day before with significant sweating. We have suggested he hold his Lasix and only take this when necessary for edema, shortness of breath or weight gain.

## 2012-02-14 NOTE — Assessment & Plan Note (Signed)
He appears to be relatively stable from a pulmonary perspective. No recent shortness of breath or cough.

## 2012-02-14 NOTE — Patient Instructions (Addendum)
You are doing well. Hold the lasix Take lasix only for leg edema or increasing shortness of breath, or weight gain  If your blood pressure continues to run less than 115, take only a 1/2 of amlodipine  Please call us if you have new issues that need to be addressed before your next appt.  Your physician wants you to follow-up in: 6 months.  You will receive a reminder letter in the mail two months in advance. If you don't receive a letter, please call our office to schedule the follow-up appointment.

## 2012-02-14 NOTE — Progress Notes (Signed)
Patient ID: Javier [name], male    DOB: 06-Oct-1935, 76 y.o.   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,  COPD, coronary artery disease with bypass surgery in 2007, occlusion of 2 vein grafts with PCI x4, peripheral vascular disease with bilateral carotid endarterectomies in 2002, history of TIA in 2002 who presented several times to [facility] in September 2012 with COPD exacerbation,  TIA, episodse of COPD exacerbation/bronchitis also with episode of shortness of breath and chest pain that took him to [facility] where he had a cardiac catheterization showing patent vessels with no significant progression of his disease, who presents for routine followup . He was Previously found to be resistant to Plavix by P2 Y. 12 assay  at [facility]  He reports having episodes of COPD exacerbation through the end of 2012, into 2013. He has had intramuscular shots of high-dose steroids and has been maintained on prednisone 20 mg daily with recent slow taper. He reports that his breathing has been well controlled. He is under the care of Dr. Meredeth Ide. He does report having significant sweating over the past month. Episodes come on at rest, occasionally with activity. He has had more fatigue when he has these episodes. Episodes occur once a day, possibly every other day. He did have a viral gastroenteritis several weeks ago, this has since resolved. He denies any new medications, no significant coughing or sputum production. No dysuria.  hospital records indicate that he has asymmetric LVH that is severe, diastolic relaxation abnormality, normal systolic function, mild MR significant chordal SAM with LVOT gradient of 3 m/s and gradient of 35 mm of mercury and increases to gradient of 49 with Valsalva  He had a recent hospitalization in May 2013 chest pain, COPD exacerbation. Stress test showed no ischemia. Echocardiogram was essentially  normal with mild MR. He reports having significant IV fluids through his hospital course and had edema at the time of discharge. Edema got worse through the next week with significant pain in his legs. His legs are weak and he feels very deconditioned. He has been taking Lasix 40 mg daily with no significant urination and continued leg pain with mild swelling. He scheduled for pulmonary rehabilitation June 18. While in the hospital he was started on clonidine 0.2 mg twice a day and Coreg 12.5 mg twice a day.  He presents today and reports having an episode of low blood pressure while at pulmonary rehabilitation. Systolic pressures were estimated at 80-90. He was given oral fluids with mild improvement. He was sent back to her clinic for further evaluation and medication titration. He reports rare dizziness when blood pressure is low. No significant shortness of breath or cough.  Carotid ultrasound done in the hospital 01/12/2012 shows no significant carotid disease    Outpatient Encounter Prescriptions as of 02/14/2012  Medication Sig Dispense Refill  . albuterol-ipratropium (COMBIVENT) 18-103 MCG/ACT inhaler Inhale 2 puffs into the lungs every 6 (six) hours as needed.       . ALPRAZolam (XANAX) 0.25 MG tablet Take 0.25 mg by mouth at bedtime as needed.      Marland Kitchen amLODipine (NORVASC) 10 MG tablet Take 10 mg by mouth daily.      . Ascorbic Acid (VITAMIN C) 1000 MG tablet Take 1,000 mg by mouth daily.      Marland Kitchen aspirin 162 MG EC tablet Take 162 mg by mouth daily.      Marland Kitchen  atorvastatin (LIPITOR) 40 MG tablet Take 40 mg by mouth daily.        . budesonide-formoterol (SYMBICORT) 160-4.5 MCG/ACT inhaler Inhale 2 puffs into the lungs 2 (two) times daily. 2 puffs daily        . calcium-vitamin D (OSCAL WITH D) 500-200 MG-UNIT per tablet Take 1 tablet by mouth 2 (two) times daily.       . carvedilol (COREG) 6.25 MG tablet Take 1 tablet (6.25 mg total) by mouth 2 (two) times daily with a meal.      . cloNIDine  (CATAPRES) 0.2 MG tablet Takes 1/2 tablet am and 1/2 tablet pm daily.      . Ezetimibe (ZETIA PO) Take 5 mg by mouth daily.      . furosemide (LASIX) 20 MG tablet 20 mg as needed.      Marland Kitchen guaiFENesin (MUCINEX) 600 MG 12 hr tablet Take 600 mg by mouth 2 (two) times daily as needed.       Marland Kitchen HYDROcodone-acetaminophen (LORTAB) 7.5-500 MG/15ML solution Take by mouth every 12 (twelve) hours as needed.       Marland Kitchen ipratropium-albuterol (DUONEB) 0.5-2.5 (3) MG/3ML SOLN Take 3 mLs by nebulization as needed.       . isosorbide mononitrate (IMDUR) 30 MG 24 hr tablet 30 mg. Takes 2 tablets daily.       Marland Kitchen leuprolide, 6 Month, (ELIGARD) 45 MG injection Inject 45 mg into the skin every 6 (six) months.      . Multiple Vitamins-Minerals (CENTRUM SILVER ULTRA MENS PO) Take 1 tablet by mouth daily.        . nitroGLYCERIN (NITROSTAT) 0.4 MG SL tablet Place 1 tablet (0.4 mg total) under the tongue every 5 (five) minutes as needed.  25 tablet  11  . NON FORMULARY 100 mg 2 (two) times daily. Stool softner       . NON FORMULARY Nyatatin swish and swallow.      . Omega-3 Fatty Acids (FISH OIL) 300 MG CAPS Take 1 capsule by mouth 2 (two) times daily.        Marland Kitchen omeprazole (PRILOSEC) 20 MG capsule Take 20 mg by mouth daily.      Marland Kitchen PREDNISONE PO Taper.       . tiotropium (SPIRIVA) 18 MCG inhalation capsule Place 18 mcg into inhaler and inhale daily.        . traMADol-acetaminophen (ULTRACET) 37.5-325 MG per tablet Take 1 tablet by mouth daily as needed.      Marland Kitchen DISCONTD: Ascorbic Acid (VITAMIN C) 500 MG tablet Take 1,000 mg by mouth daily.       Marland Kitchen DISCONTD: aspirin 81 MG EC tablet Take 162 mg by mouth daily.         Review of Systems  Constitutional:       Episodes of sweating  HENT: Negative.   Eyes: Negative.   Gastrointestinal: Negative.   Musculoskeletal: Negative.   Skin: Negative.   Hematological: Negative.   Psychiatric/Behavioral: Negative.   All other systems reviewed and are negative.    BP 115/68  Pulse  90  Ht 6\' 1"  (1.854 m)  Wt 234 lb (106.142 kg)  BMI 30.87 kg/m2  Physical Exam  Nursing note and vitals reviewed. Constitutional: He is oriented to person, place, and time. He appears well-developed and well-nourished.  HENT:  Head: Normocephalic.  Nose: Nose normal.  Mouth/Throat: Oropharynx is clear and moist.  Eyes: Conjunctivae are normal. Pupils are equal, round, and reactive to light.  Neck: Normal  range of motion. Neck supple. No JVD present.  Cardiovascular: Normal rate, regular rhythm, S1 normal, S2 normal and intact distal pulses.  Exam reveals no gallop and no friction rub.   Murmur heard.  Crescendo systolic murmur is present with a grade of 2/6       Trace pitting above the sock line bilaterally  Pulmonary/Chest: Effort normal. No respiratory distress. He has decreased breath sounds. He has no wheezes. He exhibits no tenderness.  Abdominal: Soft. Bowel sounds are normal. He exhibits no distension. There is no tenderness.  Musculoskeletal: Normal range of motion. He exhibits no edema and no tenderness.  Lymphadenopathy:    He has no cervical adenopathy.  Neurological: He is alert and oriented to person, place, and time. Coordination normal.  Skin: Skin is warm and dry. No rash noted. No erythema.  Psychiatric: He has a normal mood and affect. His behavior is normal. Judgment and thought content normal.           Assessment and Plan

## 2012-02-14 NOTE — Assessment & Plan Note (Signed)
Currently with no symptoms of angina. No further workup at this time. Continue current medication regimen. 

## 2012-02-14 NOTE — Assessment & Plan Note (Signed)
He is overdue for repeat lipid panel. Cholesterol last year was not at goal. We will discuss this with him on his next visit.

## 2012-02-17 ENCOUNTER — Encounter: Payer: Self-pay | Admitting: Specialist

## 2012-02-18 ENCOUNTER — Other Ambulatory Visit: Payer: Self-pay | Admitting: *Deleted

## 2012-02-18 NOTE — Telephone Encounter (Signed)
Pharmacy calling with instructions on clonidine. Please advise what dose of clonidine the patient should be taken.

## 2012-02-19 ENCOUNTER — Other Ambulatory Visit: Payer: Self-pay

## 2012-02-19 MED ORDER — CLONIDINE HCL 0.1 MG PO TABS: 0.1000 mg | ORAL_TABLET | Freq: Two times a day (BID) | ORAL | Status: DC

## 2012-03-15 ENCOUNTER — Ambulatory Visit: Payer: Self-pay | Admitting: Internal Medicine

## 2012-03-17 ENCOUNTER — Inpatient Hospital Stay: Payer: Self-pay | Admitting: Internal Medicine

## 2012-03-17 ENCOUNTER — Ambulatory Visit: Payer: Self-pay | Admitting: Internal Medicine

## 2012-03-17 LAB — CBC WITH DIFFERENTIAL/PLATELET
Basophil %: 0.2 %
Eosinophil #: 0 10*3/uL (ref 0.0–0.7)
Eosinophil %: 0 %
HCT: 37.1 % — ABNORMAL LOW (ref 40.0–52.0)
HGB: 12.7 g/dL — ABNORMAL LOW (ref 13.0–18.0)
Lymphocyte #: 1.2 10*3/uL (ref 1.0–3.6)
MCH: 30.3 pg (ref 26.0–34.0)
MCHC: 34.1 g/dL (ref 32.0–36.0)
MCV: 89 fL (ref 80–100)
Monocyte #: 0.4 x10 3/mm (ref 0.2–1.0)
Monocyte %: 1.9 %
Neutrophil %: 92.5 %
Platelet: 245 10*3/uL (ref 150–440)
RBC: 4.18 10*6/uL — ABNORMAL LOW (ref 4.40–5.90)

## 2012-03-17 LAB — COMPREHENSIVE METABOLIC PANEL
Albumin: 3.6 g/dL (ref 3.4–5.0)
Alkaline Phosphatase: 57 U/L (ref 50–136)
BUN: 25 mg/dL — ABNORMAL HIGH (ref 7–18)
Bilirubin,Total: 0.4 mg/dL (ref 0.2–1.0)
Calcium, Total: 9.3 mg/dL (ref 8.5–10.1)
Chloride: 105 mmol/L (ref 98–107)
Co2: 23 mmol/L (ref 21–32)
EGFR (Non-African Amer.): 51 — ABNORMAL LOW
Glucose: 146 mg/dL — ABNORMAL HIGH (ref 65–99)
Osmolality: 286 (ref 275–301)
SGOT(AST): 20 U/L (ref 15–37)
SGPT (ALT): 18 U/L

## 2012-03-17 LAB — APTT: Activated PTT: 26.6 secs (ref 23.6–35.9)

## 2012-03-17 LAB — PROTIME-INR
INR: 1
Prothrombin Time: 13.5 secs (ref 11.5–14.7)

## 2012-03-17 LAB — CK TOTAL AND CKMB (NOT AT ARMC): CK-MB: 0.5 ng/mL (ref 0.5–3.6)

## 2012-03-18 LAB — CBC WITH DIFFERENTIAL/PLATELET
Basophil %: 0 %
Eosinophil %: 0 %
HGB: 12.8 g/dL — ABNORMAL LOW (ref 13.0–18.0)
Lymphocyte #: 1.2 10*3/uL (ref 1.0–3.6)
MCH: 30.5 pg (ref 26.0–34.0)
MCV: 90 fL (ref 80–100)
Monocyte #: 0.3 x10 3/mm (ref 0.2–1.0)
Neutrophil %: 90.7 %
RBC: 4.2 10*6/uL — ABNORMAL LOW (ref 4.40–5.90)

## 2012-03-20 LAB — CBC WITH DIFFERENTIAL/PLATELET
Basophil #: 0 10*3/uL (ref 0.0–0.1)
Basophil %: 0 %
Eosinophil #: 0 10*3/uL (ref 0.0–0.7)
Eosinophil %: 0 %
Lymphocyte #: 1.3 10*3/uL (ref 1.0–3.6)
MCH: 30.7 pg (ref 26.0–34.0)
MCV: 89 fL (ref 80–100)
Monocyte #: 0.8 x10 3/mm (ref 0.2–1.0)
Neutrophil %: 87.5 %
Platelet: 222 10*3/uL (ref 150–440)
RBC: 4.18 10*6/uL — ABNORMAL LOW (ref 4.40–5.90)

## 2012-03-20 LAB — BASIC METABOLIC PANEL
Anion Gap: 9 (ref 7–16)
Calcium, Total: 9.1 mg/dL (ref 8.5–10.1)
Chloride: 99 mmol/L (ref 98–107)
Creatinine: 1.21 mg/dL (ref 0.60–1.30)
Osmolality: 283 (ref 275–301)
Potassium: 3.8 mmol/L (ref 3.5–5.1)
Sodium: 138 mmol/L (ref 136–145)

## 2012-03-22 LAB — COMPREHENSIVE METABOLIC PANEL
Alkaline Phosphatase: 52 U/L (ref 50–136)
BUN: 39 mg/dL — ABNORMAL HIGH (ref 7–18)
Bilirubin,Total: 0.4 mg/dL (ref 0.2–1.0)
Chloride: 103 mmol/L (ref 98–107)
Creatinine: 1.18 mg/dL (ref 0.60–1.30)
Glucose: 178 mg/dL — ABNORMAL HIGH (ref 65–99)
SGOT(AST): 37 U/L (ref 15–37)
SGPT (ALT): 65 U/L (ref 12–78)
Total Protein: 6.6 g/dL (ref 6.4–8.2)

## 2012-03-22 LAB — CBC
HCT: 37.7 % — ABNORMAL LOW (ref 40.0–52.0)
HGB: 12.8 g/dL — ABNORMAL LOW (ref 13.0–18.0)
MCH: 30.2 pg (ref 26.0–34.0)
MCHC: 34 g/dL (ref 32.0–36.0)
RDW: 14.8 % — ABNORMAL HIGH (ref 11.5–14.5)
WBC: 19.2 10*3/uL — ABNORMAL HIGH (ref 3.8–10.6)

## 2012-03-22 LAB — TROPONIN I: Troponin-I: <0.02 ng/mL

## 2012-03-22 LAB — PRO B NATRIURETIC PEPTIDE: B-Type Natriuretic Peptide: 599 pg/mL — ABNORMAL HIGH (ref 0–125)

## 2012-03-23 LAB — COMPREHENSIVE METABOLIC PANEL
Alkaline Phosphatase: 52 U/L (ref 50–136)
Calcium, Total: 9.1 mg/dL (ref 8.5–10.1)
Co2: 29 mmol/L (ref 21–32)
Creatinine: 1.23 mg/dL (ref 0.60–1.30)
EGFR (African American): >60
EGFR (Non-African Amer.): 57 — ABNORMAL LOW
Potassium: 5 mmol/L (ref 3.5–5.1)
SGOT(AST): 25 U/L (ref 15–37)
SGPT (ALT): 64 U/L (ref 12–78)

## 2012-03-23 LAB — LIPID PANEL
Cholesterol: 161 mg/dL (ref 0–200)
Ldl Cholesterol, Calc: 61 mg/dL (ref 0–100)
Triglycerides: 172 mg/dL (ref 0–200)
VLDL Cholesterol, Calc: 34 mg/dL (ref 5–40)

## 2012-03-23 LAB — CBC WITH DIFFERENTIAL/PLATELET
Basophil #: 0 10*3/uL (ref 0.0–0.1)
Eosinophil %: 0 %
HCT: 38.7 % — ABNORMAL LOW (ref 40.0–52.0)
Lymphocyte %: 7.1 %
MCHC: 34.2 g/dL (ref 32.0–36.0)
MCV: 89 fL (ref 80–100)
Monocyte #: 0.3 x10 3/mm (ref 0.2–1.0)
Monocyte %: 2.3 %
Neutrophil #: 13 10*3/uL — ABNORMAL HIGH (ref 1.4–6.5)
Neutrophil %: 90.4 %
Platelet: 200 10*3/uL (ref 150–440)
RBC: 4.33 10*6/uL — ABNORMAL LOW (ref 4.40–5.90)
RDW: 14.3 % (ref 11.5–14.5)

## 2012-03-23 LAB — CK TOTAL AND CKMB (NOT AT ARMC): CK, Total: 18 U/L — ABNORMAL LOW (ref 35–232)

## 2012-03-23 LAB — MAGNESIUM: Magnesium: 2.5 mg/dL — ABNORMAL HIGH

## 2012-03-24 ENCOUNTER — Inpatient Hospital Stay: Payer: Self-pay | Admitting: Student

## 2012-03-24 LAB — BASIC METABOLIC PANEL
BUN: 42 mg/dL — ABNORMAL HIGH (ref 7–18)
Calcium, Total: 8.3 mg/dL — ABNORMAL LOW (ref 8.5–10.1)
Chloride: 101 mmol/L (ref 98–107)
Co2: 29 mmol/L (ref 21–32)
Creatinine: 1.23 mg/dL (ref 0.60–1.30)
EGFR (African American): >60
EGFR (Non-African Amer.): 57 — ABNORMAL LOW
Glucose: 233 mg/dL — ABNORMAL HIGH (ref 65–99)
Potassium: 4.5 mmol/L (ref 3.5–5.1)
Sodium: 138 mmol/L (ref 136–145)

## 2012-03-24 LAB — CBC WITH DIFFERENTIAL/PLATELET
Basophil #: 0 10*3/uL (ref 0.0–0.1)
Basophil %: 0.1 %
Eosinophil %: 0 %
HCT: 36.7 % — ABNORMAL LOW (ref 40.0–52.0)
HGB: 12.1 g/dL — ABNORMAL LOW (ref 13.0–18.0)
Lymphocyte #: 0.8 10*3/uL — ABNORMAL LOW (ref 1.0–3.6)
MCH: 29.5 pg (ref 26.0–34.0)
MCV: 90 fL (ref 80–100)
Monocyte #: 0.7 x10 3/mm (ref 0.2–1.0)
Monocyte %: 3.8 %
Neutrophil #: 17.5 10*3/uL — ABNORMAL HIGH (ref 1.4–6.5)
Platelet: 211 10*3/uL (ref 150–440)
RBC: 4.09 10*6/uL — ABNORMAL LOW (ref 4.40–5.90)
RDW: 14.5 % (ref 11.5–14.5)
WBC: 19.1 10*3/uL — ABNORMAL HIGH (ref 3.8–10.6)

## 2012-03-25 LAB — CBC WITH DIFFERENTIAL/PLATELET
Basophil %: 0.1 %
Eosinophil #: 0 10*3/uL (ref 0.0–0.7)
Eosinophil %: 0 %
Lymphocyte #: 1 10*3/uL (ref 1.0–3.6)
Lymphocyte %: 5 %
MCH: 30.5 pg (ref 26.0–34.0)
MCHC: 33.8 g/dL (ref 32.0–36.0)
MCV: 90 fL (ref 80–100)
Monocyte #: 0.6 x10 3/mm (ref 0.2–1.0)
Neutrophil %: 92 %
Platelet: 214 10*3/uL (ref 150–440)
RBC: 4.23 10*6/uL — ABNORMAL LOW (ref 4.40–5.90)

## 2012-03-25 LAB — BASIC METABOLIC PANEL
Anion Gap: 8 (ref 7–16)
Chloride: 100 mmol/L (ref 98–107)
Co2: 30 mmol/L (ref 21–32)
EGFR (Non-African Amer.): >60
Glucose: 159 mg/dL — ABNORMAL HIGH (ref 65–99)
Potassium: 4.8 mmol/L (ref 3.5–5.1)
Sodium: 138 mmol/L (ref 136–145)

## 2012-03-26 LAB — CBC WITH DIFFERENTIAL/PLATELET
Basophil %: 0.2 %
Eosinophil %: 0 %
HCT: 36.9 % — ABNORMAL LOW (ref 40.0–52.0)
HGB: 12.7 g/dL — ABNORMAL LOW (ref 13.0–18.0)
Lymphocyte #: 0.8 10*3/uL — ABNORMAL LOW (ref 1.0–3.6)
MCV: 90 fL (ref 80–100)
Monocyte %: 3.8 %
Platelet: 195 10*3/uL (ref 150–440)
RBC: 4.12 10*6/uL — ABNORMAL LOW (ref 4.40–5.90)
WBC: 18.8 10*3/uL — ABNORMAL HIGH (ref 3.8–10.6)

## 2012-03-27 LAB — CBC WITH DIFFERENTIAL/PLATELET
Basophil #: 0 10*3/uL (ref 0.0–0.1)
Basophil %: 0.1 %
Eosinophil #: 0 10*3/uL (ref 0.0–0.7)
Eosinophil %: 0 %
HCT: 34.6 % — ABNORMAL LOW (ref 40.0–52.0)
Lymphocyte #: 1.1 10*3/uL (ref 1.0–3.6)
Lymphocyte %: 5.8 %
MCH: 30.3 pg (ref 26.0–34.0)
MCHC: 34 g/dL (ref 32.0–36.0)
MCV: 89 fL (ref 80–100)
Monocyte #: 1.4 x10 3/mm — ABNORMAL HIGH (ref 0.2–1.0)
Monocyte %: 7.8 %
Neutrophil #: 15.8 10*3/uL — ABNORMAL HIGH (ref 1.4–6.5)
Neutrophil %: 86.3 %
RDW: 14.3 % (ref 11.5–14.5)
WBC: 18.3 10*3/uL — ABNORMAL HIGH (ref 3.8–10.6)

## 2012-03-28 ENCOUNTER — Encounter: Payer: Self-pay | Admitting: Specialist

## 2012-04-19 ENCOUNTER — Encounter: Payer: Self-pay | Admitting: Specialist

## 2012-04-24 ENCOUNTER — Encounter: Payer: Self-pay | Admitting: Cardiovascular Disease

## 2012-04-24 ENCOUNTER — Ambulatory Visit (INDEPENDENT_AMBULATORY_CARE_PROVIDER_SITE_OTHER): Payer: Medicare Other | Admitting: Cardiovascular Disease

## 2012-04-24 VITALS — BP 100/62 | HR 95 | Ht 73.0 in | Wt 237.2 lb

## 2012-04-24 DIAGNOSIS — I6529 Occlusion and stenosis of unspecified carotid artery: Secondary | ICD-10-CM

## 2012-04-24 DIAGNOSIS — J449 Chronic obstructive pulmonary disease, unspecified: Secondary | ICD-10-CM

## 2012-04-24 DIAGNOSIS — R0602 Shortness of breath: Secondary | ICD-10-CM

## 2012-04-24 DIAGNOSIS — I25810 Atherosclerosis of coronary artery bypass graft(s) without angina pectoris: Secondary | ICD-10-CM

## 2012-04-24 DIAGNOSIS — I10 Essential (primary) hypertension: Secondary | ICD-10-CM

## 2012-04-24 DIAGNOSIS — R079 Chest pain, unspecified: Secondary | ICD-10-CM

## 2012-04-24 MED ORDER — AZITHROMYCIN 250 MG PO TABS: ORAL_TABLET | ORAL | Status: AC

## 2012-04-24 NOTE — Patient Instructions (Addendum)
You are doing well. Please start Z-pak 2 pills the first day then one a day for 5 days  If you continue to have low blood pressure, cut the isosorbide down to one pill a day,  take second pill as needed for chest pain, high blood pressure  Please call us if you have new issues that need to be addressed before your next appt.  Your physician wants you to follow-up in: 6 months.  You will receive a reminder letter in the mail two months in advance. If you don't receive a letter, please call our office to schedule the follow-up appointment.

## 2012-04-24 NOTE — Assessment & Plan Note (Signed)
Currently with no symptoms of angina. No further workup at this time. Continue current medication regimen. 

## 2012-04-24 NOTE — Assessment & Plan Note (Signed)
We'll continue aggressive cholesterol management. Annual carotid ultrasound

## 2012-04-24 NOTE — Assessment & Plan Note (Signed)
Blood pressure continues to run low and he has fatigue. We will cut back on his isosorbide to 30 mg daily

## 2012-04-24 NOTE — Progress Notes (Signed)
Patient ID: Javier [name], male    DOB: [birth year], 76 y.o.   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,  COPD, coronary artery disease with bypass surgery in 2007, occlusion of 2 vein grafts with PCI x4, peripheral vascular disease with bilateral carotid endarterectomies in 2002, history of TIA in 2002 who presented several times to [facility] in September 2012 with COPD exacerbation,  TIA, episodse of COPD exacerbation/bronchitis also with episode of shortness of breath and chest pain that took him to [facility] where he had a cardiac catheterization showing patent vessels with no significant progression of his disease, who presents for routine followup . He was Previously found to be resistant to Plavix by P2 Y. 12 assay  at [facility]  He reports having episodes of COPD exacerbation through the end of 2012, into 2013. He has had intramuscular shots of high-dose steroids and has been maintained on prednisone 20 mg daily with recent slow taper. He reports that his breathing has been well controlled. He is under the care of Dr. Meredeth Ide. He does report having significant sweating over the past month. Episodes come on at rest, occasionally with activity. He has had more fatigue when he has these episodes. Episodes occur once a day, possibly every other day. He did have a viral gastroenteritis several weeks ago, this has since resolved. He denies any new medications, no significant coughing or sputum production. No dysuria.  hospital records indicate that he has asymmetric LVH that is severe, diastolic relaxation abnormality, normal systolic function, mild MR significant chordal SAM with LVOT gradient of 3 m/s and gradient of 35 mm of mercury and increases to gradient of 49 with Valsalva  He had a recent hospitalization in May 2013 chest pain, COPD exacerbation. Stress test showed no ischemia. Echocardiogram was essentially  normal with mild MR. He reports having significant IV fluids through his hospital course and had edema at the time of discharge. Edema got worse through the next week with significant pain in his legs. His legs are weak and he feels very deconditioned. He has been taking Lasix 40 mg daily with no significant urination and continued leg pain with mild swelling. He scheduled for pulmonary rehabilitation June 18. While in the hospital he was started on clonidine 0.2 mg twice a day and Coreg 12.5 mg twice a day.  On his last clinic visit, blood pressure was low. We suggested he periodically hold his diuretic. He was recently discharged from the hospital at [facility] where he was admitted for COPD exacerbation. Had a long course of Levaquin. He recently had a trip to [place] for a reunion. In the past several days has had worsening cough and congestion. He is concerned about recurrent bronchitis. He is requesting an antibiotic. He is followed by Dr. Meredeth Ide next week. Blood pressure continues to run low and he does feel fatigued.  Carotid ultrasound done in the hospital 01/12/2012 shows no significant carotid disease  EKG shows normal sinus rhythm with rate 95 beats per minute with no significant ST or T wave changes   Outpatient Encounter Prescriptions as of 04/24/2012  Medication Sig Dispense Refill  . albuterol-ipratropium (COMBIVENT) 18-103 MCG/ACT inhaler Inhale 2 puffs into the lungs every 6 (six) hours as needed.       . ALPRAZolam (XANAX) 0.25 MG tablet Take 0.25 mg by mouth at bedtime as needed.      Marland Kitchen amLODipine (NORVASC) 10 MG  tablet Take 1 tablet (10 mg total) by mouth daily.  90 tablet  3  . Ascorbic Acid (VITAMIN C) 1000 MG tablet Take 1,000 mg by mouth daily.      Marland Kitchen aspirin 162 MG EC tablet Take 162 mg by mouth daily.      . budesonide-formoterol (SYMBICORT) 160-4.5 MCG/ACT inhaler Inhale 2 puffs into the lungs 2 (two) times daily. 2 puffs daily        . calcium-vitamin D (OSCAL WITH D)  500-200 MG-UNIT per tablet Take 1 tablet by mouth 2 (two) times daily.       . carvedilol (COREG) 6.25 MG tablet Take 1 tablet (6.25 mg total) by mouth 2 (two) times daily with a meal.      . cloNIDine (CATAPRES) 0.1 MG tablet Take 1 tablet (0.1 mg total) by mouth 2 (two) times daily.  180 tablet  3  . Ezetimibe (ZETIA PO) Take 5 mg by mouth daily.      . furosemide (LASIX) 20 MG tablet 20 mg as needed.      Marland Kitchen guaiFENesin (MUCINEX) 600 MG 12 hr tablet Take 600 mg by mouth 2 (two) times daily as needed.       Marland Kitchen HYDROcodone-acetaminophen (LORTAB) 7.5-500 MG/15ML solution Take by mouth every 12 (twelve) hours as needed.       Marland Kitchen ipratropium-albuterol (DUONEB) 0.5-2.5 (3) MG/3ML SOLN Take 3 mLs by nebulization as needed.       . isosorbide mononitrate (IMDUR) 30 MG 24 hr tablet 30 mg. Takes 2 tablets daily.       Marland Kitchen leuprolide, 6 Month, (ELIGARD) 45 MG injection Inject 45 mg into the skin every 6 (six) months.      . Multiple Vitamins-Minerals (CENTRUM SILVER ULTRA MENS PO) Take 1 tablet by mouth daily.        . nitroGLYCERIN (NITROSTAT) 0.4 MG SL tablet Place 1 tablet (0.4 mg total) under the tongue every 5 (five) minutes as needed.  25 tablet  11  . NON FORMULARY 100 mg 2 (two) times daily. Stool softner       . NON FORMULARY Nyatatin swish and swallow.      . Omega-3 Fatty Acids (FISH OIL) 300 MG CAPS Take 1 capsule by mouth 2 (two) times daily.        Marland Kitchen omeprazole (PRILOSEC) 20 MG capsule Take 20 mg by mouth daily.      Marland Kitchen PREDNISONE PO Taper.       . rosuvastatin (CRESTOR) 20 MG tablet Take 20 mg by mouth daily.      . theophylline (THEO-24) 200 MG 24 hr capsule Take 200 mg by mouth every 12 (twelve) hours.      Marland Kitchen tiotropium (SPIRIVA) 18 MCG inhalation capsule Place 18 mcg into inhaler and inhale daily.        . traMADol-acetaminophen (ULTRACET) 37.5-325 MG per tablet Take 1 tablet by mouth daily as needed.      Marland Kitchen azithromycin (ZITHROMAX) 250 MG tablet Take two the first day, then one a day for 5  days  6 each  1  . DISCONTD: atorvastatin (LIPITOR) 40 MG tablet Take 40 mg by mouth daily.          Review of Systems  Constitutional:       Episodes of sweating  HENT: Negative.   Eyes: Negative.   Gastrointestinal: Negative.   Musculoskeletal: Negative.   Skin: Negative.   Hematological: Negative.   Psychiatric/Behavioral: Negative.   All other systems reviewed and  are negative.    BP 100/62  Pulse 95  Ht 6\' 1"  (1.854 m)  Wt 237 lb 4 oz (107.616 kg)  BMI 31.30 kg/m2  Physical Exam  Nursing note and vitals reviewed. Constitutional: He is oriented to person, place, and time. He appears well-developed and well-nourished.  HENT:  Head: Normocephalic.  Nose: Nose normal.  Mouth/Throat: Oropharynx is clear and moist.  Eyes: Conjunctivae are normal. Pupils are equal, round, and reactive to light.  Neck: Normal range of motion. Neck supple. No JVD present.  Cardiovascular: Normal rate, regular rhythm, S1 normal, S2 normal and intact distal pulses.  Exam reveals no gallop and no friction rub.   Murmur heard.  Crescendo systolic murmur is present with a grade of 2/6       Trace pitting above the sock line bilaterally  Pulmonary/Chest: Effort normal. No respiratory distress. He has decreased breath sounds. He has no wheezes. He exhibits no tenderness.  Abdominal: Soft. Bowel sounds are normal. He exhibits no distension. There is no tenderness.  Musculoskeletal: Normal range of motion. He exhibits no edema and no tenderness.  Lymphadenopathy:    He has no cervical adenopathy.  Neurological: He is alert and oriented to person, place, and time. Coordination normal.  Skin: Skin is warm and dry. No rash noted. No erythema.  Psychiatric: He has a normal mood and affect. His behavior is normal. Judgment and thought content normal.           Assessment and Plan

## 2012-04-24 NOTE — Assessment & Plan Note (Signed)
He reports increased cough consistent with recurrent bronchitis since his recent travel to [place]. He has followup with Dr. Meredeth Ide in one week. He is requesting Z-Pak. We will prescribe this and suggested he closely followup with Dr. Meredeth Ide.

## 2012-04-27 ENCOUNTER — Inpatient Hospital Stay: Payer: Self-pay | Admitting: Internal Medicine

## 2012-04-27 LAB — URINALYSIS, COMPLETE
Bacteria: NONE SEEN
Bilirubin,UR: NEGATIVE
Glucose,UR: NEGATIVE mg/dL (ref 0–75)
Ketone: NEGATIVE
Leukocyte Esterase: NEGATIVE
Ph: 5 (ref 4.5–8.0)
Protein: NEGATIVE
RBC,UR: 1 /HPF (ref 0–5)
Squamous Epithelial: NONE SEEN
WBC UR: <1 /HPF (ref 0–5)

## 2012-04-27 LAB — COMPREHENSIVE METABOLIC PANEL
Anion Gap: 6 — ABNORMAL LOW (ref 7–16)
Chloride: 108 mmol/L — ABNORMAL HIGH (ref 98–107)
Co2: 26 mmol/L (ref 21–32)
EGFR (African American): >60
EGFR (Non-African Amer.): >60
Glucose: 92 mg/dL (ref 65–99)
SGOT(AST): 26 U/L (ref 15–37)
SGPT (ALT): 23 U/L (ref 12–78)
Total Protein: 7.8 g/dL (ref 6.4–8.2)

## 2012-04-27 LAB — CBC
HCT: 38.4 % — ABNORMAL LOW (ref 40.0–52.0)
HGB: 13.2 g/dL (ref 13.0–18.0)
RDW: 13.8 % (ref 11.5–14.5)
WBC: 11.3 10*3/uL — ABNORMAL HIGH (ref 3.8–10.6)

## 2012-04-27 LAB — CK TOTAL AND CKMB (NOT AT ARMC)
CK, Total: 19 U/L — ABNORMAL LOW (ref 35–232)
CK-MB: <0.5 ng/mL — ABNORMAL LOW (ref 0.5–3.6)

## 2012-04-27 LAB — PRO B NATRIURETIC PEPTIDE: B-Type Natriuretic Peptide: 274 pg/mL — ABNORMAL HIGH (ref 0–125)

## 2012-04-28 LAB — BASIC METABOLIC PANEL
Anion Gap: 12 (ref 7–16)
Chloride: 107 mmol/L (ref 98–107)
Co2: 22 mmol/L (ref 21–32)
EGFR (African American): >60
EGFR (Non-African Amer.): 59 — ABNORMAL LOW
Glucose: 130 mg/dL — ABNORMAL HIGH (ref 65–99)
Osmolality: 284 (ref 275–301)

## 2012-04-28 LAB — CBC WITH DIFFERENTIAL/PLATELET
Basophil #: 0 10*3/uL (ref 0.0–0.1)
Eosinophil #: 0 10*3/uL (ref 0.0–0.7)
Eosinophil %: 0 %
HGB: 12.9 g/dL — ABNORMAL LOW (ref 13.0–18.0)
Lymphocyte %: 12.8 %
MCH: 30.8 pg (ref 26.0–34.0)
MCHC: 34.5 g/dL (ref 32.0–36.0)
Monocyte #: 0.1 x10 3/mm — ABNORMAL LOW (ref 0.2–1.0)
Monocyte %: 1.5 %
Neutrophil #: 6.9 10*3/uL — ABNORMAL HIGH (ref 1.4–6.5)
Platelet: 257 10*3/uL (ref 150–440)
RDW: 14 % (ref 11.5–14.5)
WBC: 8 10*3/uL (ref 3.8–10.6)

## 2012-05-15 ENCOUNTER — Other Ambulatory Visit: Payer: Self-pay

## 2012-05-15 MED ORDER — ISOSORBIDE MONONITRATE ER 30 MG PO TB24: 30.0000 mg | ORAL_TABLET | Freq: Every day | ORAL | Status: DC

## 2012-05-15 NOTE — Telephone Encounter (Signed)
Refill sent for Imdur.

## 2012-05-19 ENCOUNTER — Ambulatory Visit: Payer: Self-pay | Admitting: Specialist

## 2012-07-03 ENCOUNTER — Inpatient Hospital Stay: Payer: Self-pay | Admitting: Internal Medicine

## 2012-07-03 LAB — BASIC METABOLIC PANEL
Anion Gap: 5 — ABNORMAL LOW (ref 7–16)
BUN: 26 mg/dL — ABNORMAL HIGH (ref 7–18)
Chloride: 107 mmol/L (ref 98–107)
Co2: 29 mmol/L (ref 21–32)
Creatinine: 1.06 mg/dL (ref 0.60–1.30)
EGFR (African American): >60
EGFR (Non-African Amer.): >60
Osmolality: 287 (ref 275–301)
Potassium: 4.1 mmol/L (ref 3.5–5.1)
Sodium: 141 mmol/L (ref 136–145)

## 2012-07-03 LAB — CBC
HCT: 36.6 % — ABNORMAL LOW (ref 40.0–52.0)
HGB: 12.4 g/dL — ABNORMAL LOW (ref 13.0–18.0)
MCH: 30.1 pg (ref 26.0–34.0)
MCHC: 33.9 g/dL (ref 32.0–36.0)
RDW: 14.1 % (ref 11.5–14.5)
WBC: 14.9 10*3/uL — ABNORMAL HIGH (ref 3.8–10.6)

## 2012-07-03 LAB — PRO B NATRIURETIC PEPTIDE: B-Type Natriuretic Peptide: 1030 pg/mL — ABNORMAL HIGH (ref 0–125)

## 2012-07-04 LAB — CBC WITH DIFFERENTIAL/PLATELET
Basophil %: 0.2 %
Eosinophil #: 0 10*3/uL (ref 0.0–0.7)
Eosinophil %: 0 %
HGB: 12.5 g/dL — ABNORMAL LOW (ref 13.0–18.0)
Lymphocyte #: 1.4 10*3/uL (ref 1.0–3.6)
Lymphocyte %: 8.5 %
MCH: 31.5 pg (ref 26.0–34.0)
MCHC: 35.3 g/dL (ref 32.0–36.0)
Monocyte #: 0.6 x10 3/mm (ref 0.2–1.0)
Neutrophil %: 87.5 %
Platelet: 174 10*3/uL (ref 150–440)
RBC: 3.97 10*6/uL — ABNORMAL LOW (ref 4.40–5.90)
RDW: 14.3 % (ref 11.5–14.5)

## 2012-07-04 LAB — BASIC METABOLIC PANEL
Calcium, Total: 8.9 mg/dL (ref 8.5–10.1)
Chloride: 108 mmol/L — ABNORMAL HIGH (ref 98–107)
Co2: 27 mmol/L (ref 21–32)
Creatinine: 1.12 mg/dL (ref 0.60–1.30)
EGFR (African American): >60
EGFR (Non-African Amer.): >60
Glucose: 112 mg/dL — ABNORMAL HIGH (ref 65–99)
Osmolality: 292 (ref 275–301)
Potassium: 4.4 mmol/L (ref 3.5–5.1)
Sodium: 143 mmol/L (ref 136–145)

## 2012-08-31 ENCOUNTER — Inpatient Hospital Stay: Payer: Self-pay | Admitting: Internal Medicine

## 2012-08-31 LAB — BASIC METABOLIC PANEL
Anion Gap: 10 (ref 7–16)
Chloride: 107 mmol/L (ref 98–107)
Co2: 24 mmol/L (ref 21–32)
EGFR (Non-African Amer.): 54 — ABNORMAL LOW
Glucose: 118 mg/dL — ABNORMAL HIGH (ref 65–99)
Osmolality: 286 (ref 275–301)
Potassium: 3.5 mmol/L (ref 3.5–5.1)
Sodium: 141 mmol/L (ref 136–145)

## 2012-08-31 LAB — CBC
HCT: 36.8 % — ABNORMAL LOW (ref 40.0–52.0)
MCH: 27.8 pg (ref 26.0–34.0)
MCV: 89 fL (ref 80–100)
RBC: 4.14 10*6/uL — ABNORMAL LOW (ref 4.40–5.90)

## 2012-08-31 LAB — PRO B NATRIURETIC PEPTIDE: B-Type Natriuretic Peptide: 455 pg/mL — ABNORMAL HIGH (ref 0–125)

## 2012-08-31 LAB — TROPONIN I: Troponin-I: <0.02 ng/mL

## 2012-09-01 LAB — BASIC METABOLIC PANEL
Anion Gap: 9 (ref 7–16)
BUN: 29 mg/dL — ABNORMAL HIGH (ref 7–18)
Calcium, Total: 8.8 mg/dL (ref 8.5–10.1)
EGFR (African American): >60
EGFR (Non-African Amer.): >60
Glucose: 150 mg/dL — ABNORMAL HIGH (ref 65–99)
Potassium: 4.3 mmol/L (ref 3.5–5.1)

## 2012-09-01 LAB — CBC WITH DIFFERENTIAL/PLATELET
Basophil #: 0 10*3/uL (ref 0.0–0.1)
HCT: 34.6 % — ABNORMAL LOW (ref 40.0–52.0)
Lymphocyte #: 1 10*3/uL (ref 1.0–3.6)
MCH: 29.8 pg (ref 26.0–34.0)
MCHC: 34.1 g/dL (ref 32.0–36.0)
MCV: 88 fL (ref 80–100)
Monocyte %: 2.7 %
Neutrophil %: 91.7 %
WBC: 18.4 10*3/uL — ABNORMAL HIGH (ref 3.8–10.6)

## 2012-09-02 LAB — CBC WITH DIFFERENTIAL/PLATELET
Basophil #: 0 10*3/uL (ref 0.0–0.1)
Basophil %: 0.1 %
Eosinophil #: 0 10*3/uL (ref 0.0–0.7)
HCT: 34.4 % — ABNORMAL LOW (ref 40.0–52.0)
Lymphocyte #: 1.2 10*3/uL (ref 1.0–3.6)
MCHC: 34.6 g/dL (ref 32.0–36.0)
MCV: 87 fL (ref 80–100)
Monocyte #: 0.7 x10 3/mm (ref 0.2–1.0)
Monocyte %: 3.1 %
Neutrophil #: 21.4 10*3/uL — ABNORMAL HIGH (ref 1.4–6.5)
Neutrophil %: 91.5 %
Platelet: 202 10*3/uL (ref 150–440)
RBC: 3.93 10*6/uL — ABNORMAL LOW (ref 4.40–5.90)
WBC: 23.4 10*3/uL — ABNORMAL HIGH (ref 3.8–10.6)

## 2012-09-05 LAB — CULTURE, BLOOD (SINGLE)

## 2012-09-28 ENCOUNTER — Ambulatory Visit (INDEPENDENT_AMBULATORY_CARE_PROVIDER_SITE_OTHER): Payer: Medicare Other | Admitting: Cardiovascular Disease

## 2012-09-28 ENCOUNTER — Encounter: Payer: Self-pay | Admitting: Cardiovascular Disease

## 2012-09-28 VITALS — BP 146/81 | HR 77 | Ht 73.0 in | Wt 253.2 lb

## 2012-09-28 DIAGNOSIS — I6529 Occlusion and stenosis of unspecified carotid artery: Secondary | ICD-10-CM

## 2012-09-28 DIAGNOSIS — R609 Edema, unspecified: Secondary | ICD-10-CM

## 2012-09-28 DIAGNOSIS — E785 Hyperlipidemia, unspecified: Secondary | ICD-10-CM

## 2012-09-28 DIAGNOSIS — R531 Weakness: Secondary | ICD-10-CM

## 2012-09-28 DIAGNOSIS — R5383 Other fatigue: Secondary | ICD-10-CM

## 2012-09-28 DIAGNOSIS — R5381 Other malaise: Secondary | ICD-10-CM

## 2012-09-28 DIAGNOSIS — R0789 Other chest pain: Secondary | ICD-10-CM

## 2012-09-28 DIAGNOSIS — D649 Anemia, unspecified: Secondary | ICD-10-CM

## 2012-09-28 DIAGNOSIS — R0602 Shortness of breath: Secondary | ICD-10-CM

## 2012-09-28 DIAGNOSIS — I25810 Atherosclerosis of coronary artery bypass graft(s) without angina pectoris: Secondary | ICD-10-CM

## 2012-09-28 NOTE — Assessment & Plan Note (Signed)
Recent weakness and shortness of breath, anemia.

## 2012-09-28 NOTE — Assessment & Plan Note (Signed)
Currently with no symptoms of angina. No further workup at this time. Continue current medication regimen. 

## 2012-09-28 NOTE — Patient Instructions (Addendum)
Please start iron three times a day, Diet rich in iron (meat, spinish, liver) Take with stool softener  Continue to take lasix as needed for leg swelling  Hematology: [facility]  Please call us if you have new issues that need to be addressed before your next appt.  Your physician wants you to follow-up in: 6 months.  You will receive a reminder letter in the mail two months in advance. If you don't receive a letter, please call our office to schedule the follow-up appointment.

## 2012-09-28 NOTE — Assessment & Plan Note (Signed)
Iron deficiency anemia based on recent lab work. This is likely the cause of his shortness of breath, edema, fatigue. BNP unreliable in the setting of COPD. He is taking Lasix periodically for worsening edema. I stressed to him the importance of iron intake and resolving his anemia. Ideally given his underlying CAD, hematocrit should be no less than 30. He'll talk with his primary care physician. Also looking for a referral to hematology. We will give him a phone number of someone locally if he would like.

## 2012-09-28 NOTE — Assessment & Plan Note (Signed)
Mild bilateral CAD, continue aggressive cholesterol management

## 2012-09-28 NOTE — Progress Notes (Signed)
Patient ID: Javier [name], male    DOB: June 29, 1936, 77 y.o.   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 He was previously found to be resistant to Plavix by P2 Y. 12 assay  at [facility] records indicate that he has asymmetric LVH that is severe, diastolic relaxation abnormality, normal systolic function, mild MR significant chordal SAM with LVOT gradient of 3 m/s and gradient of 35 mm of mercury and increases to gradient of 49 with Valsalva  COPD exacerbation through the end of 2012, into 2013.  intramuscular shots of high-dose steroids and has been maintained on prednisone with recent slow taper.   hospitalization in May 2013 chest pain, COPD exacerbation. Stress test showed no ischemia. Echocardiogram was essentially normal with mild MR. received significant IV fluids through his hospital course. Edema got worse through the next week with significant pain in his legs. This improved on outpatient diuretics.  Lasix held periodically for low blood pressure Several repeat admissions for COPD exacerbation, 1 last month for bronchitis/pneumonia. Symptoms felt not to be secondary to CHF. Since his discharge, he has had more weakness, shortness of breath, edema.  Found to be very iron deficient, very anemic. Labs show iron level XVI, hematocrit 24 BNP 400  priorCarotid  ultrasound done in the hospital 01/12/2012 shows no significant carotid disease  EKG shows normal sinus rhythm with rate 77 beats per minute with no significant ST or T wave changes   Outpatient Encounter Prescriptions as of 09/28/2012  Medication Sig Dispense Refill  . albuterol-ipratropium (COMBIVENT) 18-103 MCG/ACT inhaler Inhale 2 puffs into the lungs every 6 (six) hours as needed.       . ALPRAZolam (XANAX) 0.25 MG tablet Take 0.25 mg by mouth at bedtime as needed.      Marland Kitchen amLODipine (NORVASC) 10 MG tablet Take 1 tablet (10 mg total) by mouth daily.  90 tablet  3  . Ascorbic Acid (VITAMIN C) 1000 MG tablet Take 1,000 mg by mouth daily.      Marland Kitchen aspirin 162 MG EC tablet Take 162 mg by mouth daily.      . budesonide-formoterol (SYMBICORT) 160-4.5 MCG/ACT inhaler Inhale 2 puffs into the lungs 2 (two) times daily. 2 puffs daily        . calcium-vitamin D (OSCAL WITH D) 500-200 MG-UNIT per tablet Take 1 tablet by mouth 2 (two) times daily.       . carvedilol (COREG) 6.25 MG tablet Take 1 tablet (6.25 mg total) by mouth 2 (two) times daily with a meal.      . cloNIDine (CATAPRES) 0.1 MG tablet Take 1 tablet (0.1 mg total) by mouth 2 (two) times daily.  180 tablet  3  . Ezetimibe (ZETIA PO) Take 5 mg by mouth daily.      . furosemide (LASIX) 20 MG tablet 20 mg as needed.      Marland Kitchen guaiFENesin (MUCINEX) 600 MG 12 hr  tablet Take 600 mg by mouth 2 (two) times daily as needed.       Marland Kitchen HYDROcodone-acetaminophen (LORTAB) 7.5-500 MG/15ML solution Take by mouth every 12 (twelve) hours as needed.       Marland Kitchen ipratropium-albuterol (DUONEB) 0.5-2.5 (3) MG/3ML SOLN Take 3 mLs by nebulization as needed.       . isosorbide mononitrate (IMDUR) 30 MG 24 hr tablet Take 1 tablet (30 mg total) by mouth daily.  30 tablet  6  . leuprolide, 6 Month, (ELIGARD) 45 MG injection Inject 45 mg into the skin every 6 (six) months.      . montelukast (SINGULAIR) 10 MG tablet Take 10 mg by mouth daily.      . Multiple Vitamins-Minerals  (CENTRUM SILVER ULTRA MENS PO) Take 1 tablet by mouth daily.        . nitroGLYCERIN (NITROSTAT) 0.4 MG SL tablet Place 1 tablet (0.4 mg total) under the tongue every 5 (five) minutes as needed.  25 tablet  11  . NON FORMULARY 100 mg 2 (two) times daily. Stool softner       . NON FORMULARY Nyatatin swish and swallow.      . Omega-3 Fatty Acids (FISH OIL) 300 MG CAPS Take 1 capsule by mouth 2 (two) times daily.        Marland Kitchen omeprazole (PRILOSEC) 20 MG capsule Take 20 mg by mouth 2 (two) times daily.       . rosuvastatin (CRESTOR) 20 MG tablet Take 20 mg by mouth daily.      . theophylline (THEO-24) 200 MG 24 hr capsule Take 200 mg by mouth every 12 (twelve) hours.      Marland Kitchen tiotropium (SPIRIVA) 18 MCG inhalation capsule Place 18 mcg into inhaler and inhale daily.        . traMADol-acetaminophen (ULTRACET) 37.5-325 MG per tablet Take 1 tablet by mouth daily as needed.      . [DISCONTINUED] PREDNISONE PO Taper.        Review of Systems  Constitutional: Positive for fatigue.  HENT: Negative.   Eyes: Negative.   Respiratory: Positive for shortness of breath.   Cardiovascular: Positive for leg swelling.  Gastrointestinal: Negative.   Musculoskeletal: Negative.   Skin: Negative.   Neurological: Positive for weakness.  Psychiatric/Behavioral: Negative.   All other systems reviewed and are negative.    BP 146/81  Pulse 77  Ht 6\' 1"  (1.854 m)  Wt 253 lb 4 oz (114.873 kg)  BMI 33.42 kg/m2  Physical Exam  Nursing note and vitals reviewed. Constitutional: He is oriented to person, place, and time. He appears well-developed and well-nourished.  HENT:  Head: Normocephalic.  Nose: Nose normal.  Mouth/Throat: Oropharynx is clear and moist.  Eyes: Conjunctivae are normal. Pupils are equal, round, and reactive to light.  Neck: Normal range of motion. Neck supple. No JVD present.  Cardiovascular: Normal rate, regular rhythm, S1 normal, S2 normal and intact distal pulses.  Exam reveals no gallop and no  friction rub.   Murmur heard.  Crescendo systolic murmur is present with a grade of 2/6  Trace pitting above the sock line bilaterally  Pulmonary/Chest: Effort normal. No respiratory distress. He has decreased breath sounds. He has no wheezes. He exhibits no tenderness.  Abdominal: Soft. Bowel sounds are normal. He exhibits no distension. There is no tenderness.  Musculoskeletal: Normal range of motion. He exhibits no edema and no tenderness.  Lymphadenopathy:    He has no cervical adenopathy.  Neurological: He is alert and  oriented to person, place, and time. Coordination normal.  Skin: Skin is warm and dry. No rash noted. No erythema.  Psychiatric: He has a normal mood and affect. His behavior is normal. Judgment and thought content normal.      Assessment and Plan

## 2012-09-28 NOTE — Assessment & Plan Note (Signed)
Edema likely from underlying anemia, diastolic dysfunction. Improved with several doses of Lasix.

## 2012-09-28 NOTE — Assessment & Plan Note (Signed)
Continue Crestor. Goal LDL less than 70 

## 2012-10-06 ENCOUNTER — Ambulatory Visit: Payer: Self-pay | Admitting: Internal Medicine

## 2012-10-06 ENCOUNTER — Other Ambulatory Visit: Payer: Self-pay

## 2012-10-06 LAB — CBC CANCER CENTER
Basophil #: 0.1 x10 3/mm (ref 0.0–0.1)
Eosinophil #: 0.3 x10 3/mm (ref 0.0–0.7)
Eosinophil %: 3.4 %
MCH: 29.6 pg (ref 26.0–34.0)
MCHC: 33 g/dL (ref 32.0–36.0)
MCV: 90 fL (ref 80–100)
Monocyte %: 9.2 %
Neutrophil #: 5.9 x10 3/mm (ref 1.4–6.5)
Neutrophil %: 64.5 %
Platelet: 250 x10 3/mm (ref 150–440)
RBC: 3.1 10*6/uL — ABNORMAL LOW (ref 4.40–5.90)
RDW: 16.7 % — ABNORMAL HIGH (ref 11.5–14.5)

## 2012-10-06 LAB — RETICULOCYTES
Absolute Retic Count: 0.106 10*6/uL (ref 0.031–0.129)
Reticulocyte: 3.5 % — ABNORMAL HIGH (ref 0.7–2.5)

## 2012-10-06 LAB — HEPATIC FUNCTION PANEL A (ARMC)
Albumin: 3.6 g/dL (ref 3.4–5.0)
Alkaline Phosphatase: 70 U/L (ref 50–136)
Bilirubin, Direct: 0.1 mg/dL (ref 0.00–0.20)
Bilirubin,Total: 0.3 mg/dL (ref 0.2–1.0)
SGPT (ALT): 18 U/L (ref 12–78)
Total Protein: 7.5 g/dL (ref 6.4–8.2)

## 2012-10-06 LAB — PLATELET FUNCTION ASSAY: COL/EPI PLT FXN SCRN: 193 Seconds — ABNORMAL HIGH

## 2012-10-06 LAB — CREATININE, SERUM
EGFR (African American): 55 — ABNORMAL LOW
EGFR (Non-African Amer.): 47 — ABNORMAL LOW

## 2012-10-06 LAB — LACTATE DEHYDROGENASE: LDH: 186 U/L (ref 85–241)

## 2012-10-06 LAB — PROTIME-INR: INR: 1.1

## 2012-10-06 LAB — IRON AND TIBC
Iron Saturation: 7 %
Unbound Iron-Bind.Cap.: 397 ug/dL

## 2012-10-06 LAB — FERRITIN: Ferritin (ARMC): 20 ng/mL (ref 8–388)

## 2012-10-06 MED ORDER — EZETIMIBE 10 MG PO TABS: 10.0000 mg | ORAL_TABLET | Freq: Every day | ORAL | Status: DC

## 2012-10-06 NOTE — Telephone Encounter (Signed)
Refill sent for zetia.  

## 2012-10-07 LAB — PROT IMMUNOELECTROPHORES(ARMC)

## 2012-10-08 ENCOUNTER — Inpatient Hospital Stay: Payer: Self-pay | Admitting: Internal Medicine

## 2012-10-08 LAB — URINALYSIS, COMPLETE
Bilirubin,UR: NEGATIVE
Glucose,UR: NEGATIVE mg/dL (ref 0–75)
Ketone: NEGATIVE
Nitrite: NEGATIVE
RBC,UR: NONE SEEN /HPF (ref 0–5)
Specific Gravity: 1.011 (ref 1.003–1.030)
Squamous Epithelial: NONE SEEN

## 2012-10-08 LAB — CBC
HCT: 27.6 % — ABNORMAL LOW (ref 40.0–52.0)
HGB: 8.7 g/dL — ABNORMAL LOW (ref 13.0–18.0)
MCHC: 31.5 g/dL — ABNORMAL LOW (ref 32.0–36.0)
MCV: 90 fL (ref 80–100)
Platelet: 216 10*3/uL (ref 150–440)
RBC: 3.09 10*6/uL — ABNORMAL LOW (ref 4.40–5.90)
WBC: 9.1 10*3/uL (ref 3.8–10.6)

## 2012-10-08 LAB — COMPREHENSIVE METABOLIC PANEL
Albumin: 3.4 g/dL (ref 3.4–5.0)
BUN: 21 mg/dL — ABNORMAL HIGH (ref 7–18)
Calcium, Total: 8.7 mg/dL (ref 8.5–10.1)
Chloride: 107 mmol/L (ref 98–107)
Creatinine: 1.14 mg/dL (ref 0.60–1.30)
EGFR (African American): >60
Osmolality: 285 (ref 275–301)
SGOT(AST): 17 U/L (ref 15–37)
Sodium: 141 mmol/L (ref 136–145)
Total Protein: 7.2 g/dL (ref 6.4–8.2)

## 2012-10-08 LAB — TROPONIN I: Troponin-I: <0.02 ng/mL

## 2012-10-08 LAB — PRO B NATRIURETIC PEPTIDE: B-Type Natriuretic Peptide: 292 pg/mL — ABNORMAL HIGH (ref 0–125)

## 2012-10-09 LAB — BASIC METABOLIC PANEL
Anion Gap: 13 (ref 7–16)
Chloride: 102 mmol/L (ref 98–107)
Co2: 24 mmol/L (ref 21–32)
EGFR (African American): 52 — ABNORMAL LOW
EGFR (Non-African Amer.): 44 — ABNORMAL LOW
Glucose: 209 mg/dL — ABNORMAL HIGH (ref 65–99)
Osmolality: 287 (ref 275–301)
Sodium: 139 mmol/L (ref 136–145)

## 2012-10-09 LAB — CBC WITH DIFFERENTIAL/PLATELET
Eosinophil #: 0 10*3/uL (ref 0.0–0.7)
HGB: 10.5 g/dL — ABNORMAL LOW (ref 13.0–18.0)
Lymphocyte #: 1 10*3/uL (ref 1.0–3.6)
Lymphocyte %: 11.4 %
MCHC: 35.1 g/dL (ref 32.0–36.0)
MCV: 89 fL (ref 80–100)
Monocyte %: 0.6 %
Neutrophil %: 87.9 %
Platelet: 236 10*3/uL (ref 150–440)
RDW: 15.9 % — ABNORMAL HIGH (ref 11.5–14.5)
WBC: 8.6 10*3/uL (ref 3.8–10.6)

## 2012-10-10 LAB — CBC WITH DIFFERENTIAL/PLATELET
Basophil #: 0 10*3/uL (ref 0.0–0.1)
Basophil %: 0 %
Eosinophil #: 0 10*3/uL (ref 0.0–0.7)
HGB: 8.8 g/dL — ABNORMAL LOW (ref 13.0–18.0)
MCH: 28.9 pg (ref 26.0–34.0)
MCV: 89 fL (ref 80–100)
Monocyte %: 2.3 %
Neutrophil #: 24.2 10*3/uL — ABNORMAL HIGH (ref 1.4–6.5)
Neutrophil %: 93.5 %
Platelet: 215 10*3/uL (ref 150–440)
RBC: 3.06 10*6/uL — ABNORMAL LOW (ref 4.40–5.90)
RDW: 16.3 % — ABNORMAL HIGH (ref 11.5–14.5)

## 2012-10-10 LAB — BASIC METABOLIC PANEL
Anion Gap: 12 (ref 7–16)
BUN: 32 mg/dL — ABNORMAL HIGH (ref 7–18)
Calcium, Total: 9.1 mg/dL (ref 8.5–10.1)
Co2: 24 mmol/L (ref 21–32)
Creatinine: 1.4 mg/dL — ABNORMAL HIGH (ref 0.60–1.30)
EGFR (African American): 57 — ABNORMAL LOW
EGFR (Non-African Amer.): 49 — ABNORMAL LOW
Glucose: 168 mg/dL — ABNORMAL HIGH (ref 65–99)
Osmolality: 281 (ref 275–301)
Sodium: 135 mmol/L — ABNORMAL LOW (ref 136–145)

## 2012-10-11 LAB — CBC WITH DIFFERENTIAL/PLATELET
Basophil #: 0 10*3/uL (ref 0.0–0.1)
Basophil %: 0.1 %
Eosinophil #: 0 10*3/uL (ref 0.0–0.7)
Eosinophil %: 0 %
HGB: 9.7 g/dL — ABNORMAL LOW (ref 13.0–18.0)
Lymphocyte %: 3.9 %
MCHC: 32.7 g/dL (ref 32.0–36.0)
Monocyte #: 0.5 x10 3/mm (ref 0.2–1.0)
Monocyte %: 1.8 %
Neutrophil #: 23.8 10*3/uL — ABNORMAL HIGH (ref 1.4–6.5)
Platelet: 230 10*3/uL (ref 150–440)
RDW: 16.6 % — ABNORMAL HIGH (ref 11.5–14.5)
WBC: 25.3 10*3/uL — ABNORMAL HIGH (ref 3.8–10.6)

## 2012-10-11 LAB — BASIC METABOLIC PANEL
Anion Gap: 8 (ref 7–16)
BUN: 33 mg/dL — ABNORMAL HIGH (ref 7–18)
Chloride: 104 mmol/L (ref 98–107)
Co2: 28 mmol/L (ref 21–32)
EGFR (African American): >60
EGFR (Non-African Amer.): 52 — ABNORMAL LOW
Osmolality: 289 (ref 275–301)
Potassium: 4.8 mmol/L (ref 3.5–5.1)
Sodium: 140 mmol/L (ref 136–145)

## 2012-10-12 LAB — CBC WITH DIFFERENTIAL/PLATELET
Basophil #: 0 10*3/uL (ref 0.0–0.1)
Basophil %: 0.1 %
HCT: 27.7 % — ABNORMAL LOW (ref 40.0–52.0)
HGB: 9 g/dL — ABNORMAL LOW (ref 13.0–18.0)
Lymphocyte #: 1.6 10*3/uL (ref 1.0–3.6)
MCH: 29.2 pg (ref 26.0–34.0)
MCHC: 32.5 g/dL (ref 32.0–36.0)
Monocyte #: 1.3 x10 3/mm — ABNORMAL HIGH (ref 0.2–1.0)
Monocyte %: 6.3 %
Neutrophil %: 85.8 %
WBC: 20.5 10*3/uL — ABNORMAL HIGH (ref 3.8–10.6)

## 2012-10-12 LAB — BASIC METABOLIC PANEL
Anion Gap: 7 (ref 7–16)
BUN: 33 mg/dL — ABNORMAL HIGH (ref 7–18)
Calcium, Total: 8.4 mg/dL — ABNORMAL LOW (ref 8.5–10.1)
Chloride: 102 mmol/L (ref 98–107)
Creatinine: 1.09 mg/dL (ref 0.60–1.30)
EGFR (African American): >60
Osmolality: 281 (ref 275–301)
Potassium: 4.1 mmol/L (ref 3.5–5.1)

## 2012-10-17 ENCOUNTER — Ambulatory Visit: Payer: Self-pay | Admitting: Internal Medicine

## 2012-10-27 ENCOUNTER — Ambulatory Visit: Payer: Self-pay | Admitting: Internal Medicine

## 2012-10-27 LAB — CANCER CENTER HEMOGLOBIN: HGB: 11.1 g/dL — ABNORMAL LOW (ref 13.0–18.0)

## 2012-10-30 ENCOUNTER — Ambulatory Visit: Payer: Medicare Other | Admitting: Cardiovascular Disease

## 2012-11-06 ENCOUNTER — Encounter: Payer: Self-pay | Admitting: Cardiovascular Disease

## 2012-11-06 ENCOUNTER — Ambulatory Visit (INDEPENDENT_AMBULATORY_CARE_PROVIDER_SITE_OTHER): Payer: Medicare Other | Admitting: Cardiovascular Disease

## 2012-11-06 VITALS — BP 120/64 | HR 71 | Ht 73.0 in | Wt 254.5 lb

## 2012-11-06 DIAGNOSIS — R0602 Shortness of breath: Secondary | ICD-10-CM

## 2012-11-06 DIAGNOSIS — Z01810 Encounter for preprocedural cardiovascular examination: Secondary | ICD-10-CM | POA: Insufficient documentation

## 2012-11-06 DIAGNOSIS — R609 Edema, unspecified: Secondary | ICD-10-CM

## 2012-11-06 DIAGNOSIS — J449 Chronic obstructive pulmonary disease, unspecified: Secondary | ICD-10-CM

## 2012-11-06 DIAGNOSIS — I25810 Atherosclerosis of coronary artery bypass graft(s) without angina pectoris: Secondary | ICD-10-CM

## 2012-11-06 DIAGNOSIS — E785 Hyperlipidemia, unspecified: Secondary | ICD-10-CM

## 2012-11-06 DIAGNOSIS — I10 Essential (primary) hypertension: Secondary | ICD-10-CM

## 2012-11-06 NOTE — Assessment & Plan Note (Signed)
We have suggested he stay on his crestor

## 2012-11-06 NOTE — Progress Notes (Signed)
Patient ID: Javier [name], male    DOB: 08/16/1936, 77 y.o.   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 He was previously found to be resistant to Plavix by P2 Y. 12 assay  at [facility]   asymmetric LVH that is severe, diastolic relaxation abnormality, normal systolic function, mild MR significant chordal SAM with LVOT gradient of 3 m/s and gradient of 35 mm of mercury and increases to gradient of 49 with Valsalva   Frequent COPD exacerbations, often requiring hospitalization Often needing long courses of antibiotics and steroids   hospitalization in May 2013 chest pain, COPD exacerbation. Stress test showed no ischemia. Echocardiogram was essentially normal with mild MR. received significant IV fluids through his hospital course. Edema got worse through the next week with significant pain in his legs. This improved on outpatient diuretics. Lasix held periodically for low blood pressure Found to be very iron deficient, very anemic. Labs show iron level XVI, hematocrit 24 BNP 400  He reports having recent admission to the hospital 08/31/2012 for shortness of breath. Treated for COPD exacerbation, started on Levaquin IV steroids. CT scan of the chest showing stable pulmonary nodule. No recent chest pain. Edema has improved but still with mild swelling. He is currently taking Lasix  daily, but not on a regular basis. Scheduled for EGD and colonoscopy for guaiac positive stools, to be performed by Dr. Mechele Collin  priorCarotid ultrasound done in the hospital 01/12/2012 shows no significant carotid disease  EKG shows normal sinus rhythm with rate 71 beats per minute with no significant ST or T wave changes   Outpatient Encounter Prescriptions as of 11/06/2012  Medication Sig Dispense Refill  . albuterol (VENTOLIN HFA) 108 (90 BASE) MCG/ACT inhaler Inhale 2 puffs into the lungs every 6 (six) hours as needed for wheezing.      Marland Kitchen ALPRAZolam (XANAX) 0.25 MG tablet Take 0.25 mg by mouth at bedtime as needed.      Marland Kitchen amLODipine (NORVASC) 10 MG tablet Take 5 mg by mouth daily.      . Ascorbic Acid (VITAMIN C) 1000 MG tablet Take 1,000 mg by mouth daily.      Marland Kitchen aspirin 162 MG EC tablet Take 162 mg by mouth daily.      . budesonide-formoterol (SYMBICORT) 160-4.5 MCG/ACT inhaler Inhale 2 puffs into the lungs 2 (two) times daily. 2 puffs daily        . calcium-vitamin D (OSCAL WITH D) 500-200 MG-UNIT per tablet Take 1 tablet by mouth 2 (two) times daily.       . carvedilol (COREG) 6.25 MG tablet Take 1 tablet (6.25 mg total) by mouth 2 (two) times daily with a meal.      . cloNIDine (CATAPRES) 0.1 MG tablet Take 1 tablet (0.1 mg total) by mouth 2 (two) times daily.  180 tablet  3  . furosemide (LASIX) 20 MG tablet 20 mg as needed.      Marland Kitchen  guaiFENesin (MUCINEX) 600 MG 12 hr tablet Take 600 mg by mouth 2 (two) times daily as needed.       Marland Kitchen HYDROcodone-acetaminophen (LORTAB) 7.5-500 MG/15ML solution Take by mouth every 12 (twelve) hours as needed.       Marland Kitchen ipratropium-albuterol (DUONEB) 0.5-2.5 (3) MG/3ML SOLN Take 3 mLs by nebulization as needed.       . isosorbide mononitrate (IMDUR) 30 MG 24 hr tablet Take 1 tablet (30 mg total) by mouth daily.  30 tablet  6  . leuprolide, 6 Month, (ELIGARD) 45 MG injection Inject 45 mg into the skin every 6 (six) months.      . montelukast (SINGULAIR) 10  MG tablet Take 10 mg by mouth daily.      . Multiple Vitamins-Minerals (CENTRUM SILVER ULTRA MENS PO) Take 1 tablet by mouth daily.        . nitroGLYCERIN (NITROSTAT) 0.4 MG SL tablet Place 1 tablet (0.4 mg total) under the tongue every 5 (five) minutes as needed.  25 tablet  11  . NON FORMULARY 100 mg 2 (two) times daily. Stool softner       . NON FORMULARY Nyatatin swish and swallow.      . Omega-3 Fatty Acids (FISH OIL) 300 MG CAPS Take 1 capsule by mouth 2 (two) times daily.        Marland Kitchen omeprazole (PRILOSEC) 20 MG capsule Take 20 mg by mouth 2 (two) times daily.       . rosuvastatin (CRESTOR) 20 MG tablet Take 20 mg by mouth daily.      . theophylline (THEO-24) 200 MG 24 hr capsule Take 200 mg by mouth every 12 (twelve) hours.      Marland Kitchen tiotropium (SPIRIVA) 18 MCG inhalation capsule Place 18 mcg into inhaler and inhale daily.        . traMADol-acetaminophen (ULTRACET) 37.5-325 MG per tablet Take 1 tablet by mouth daily as needed.      . Ferrous Sulfate (IRON) 325 (65 FE) MG TABS Take by mouth 2 (two) times daily.       Review of Systems  HENT: Negative.   Eyes: Negative.   Respiratory: Positive for shortness of breath.   Cardiovascular: Positive for leg swelling.  Gastrointestinal: Negative.   Musculoskeletal: Negative.   Skin: Negative.   Psychiatric/Behavioral: Negative.   All other systems reviewed and are negative.    BP 120/64  Pulse 71  Ht 6\' 1"  (1.854 m)  Wt 254 lb 8 oz (115.44 kg)  BMI 33.58 kg/m2  Physical Exam  Nursing note and vitals reviewed. Constitutional: He is oriented to person, place, and time. He appears well-developed and well-nourished.  HENT:  Head: Normocephalic.  Nose: Nose normal.  Mouth/Throat: Oropharynx is clear and moist.  Eyes: Conjunctivae are normal. Pupils are equal, round, and reactive to light.  Neck: Normal range of motion. Neck supple. No JVD present.  Cardiovascular: Normal rate, regular rhythm, S1 normal, S2 normal and intact distal  pulses.  Exam reveals no gallop and no friction rub.   Murmur heard.  Crescendo systolic murmur is present with a grade of 2/6  Trace pitting above the sock line bilaterally  Pulmonary/Chest: Effort normal. No respiratory distress. He has decreased breath sounds. He has no wheezes. He exhibits no tenderness.  Abdominal: Soft. Bowel sounds are normal. He exhibits no distension. There is no tenderness.  Musculoskeletal: Normal range of motion. He exhibits no edema and no tenderness.  Lymphadenopathy:    He has no cervical adenopathy.  Neurological: He is alert and oriented to person, place, and time. Coordination normal.  Skin: Skin is warm and dry. No rash noted. No erythema.  Psychiatric: He has a normal mood and affect. His behavior is normal. Judgment and thought content normal.      Assessment and Plan

## 2012-11-06 NOTE — Patient Instructions (Addendum)
You are doing well. Please decrease the aspirin to 81 mg daily  Please call us if you have new issues that need to be addressed before your next appt.  Your physician wants you to follow-up in: 6 months.  You will receive a reminder letter in the mail two months in advance. If you don't receive a letter, please call our office to schedule the follow-up appointment.

## 2012-11-06 NOTE — Assessment & Plan Note (Signed)
Mild pitting edema. We have encouraged him to take his Lasix daily.

## 2012-11-06 NOTE — Assessment & Plan Note (Signed)
Scheduled for EGD and colonoscopy on Monday, March 24. He would be acceptable risk. We have suggested he take Lasix daily heading into the procedure. Would recommend minimal IV fluids be used given history of diastolic CHF.

## 2012-11-06 NOTE — Assessment & Plan Note (Signed)
Long history of COPD exacerbations, currently doing well

## 2012-11-06 NOTE — Assessment & Plan Note (Signed)
Blood pressure is well controlled on today's visit. No changes made to the medications. 

## 2012-11-06 NOTE — Assessment & Plan Note (Signed)
Currently with no symptoms of angina. No further workup at this time. Continue current medication regimen. 

## 2012-11-09 ENCOUNTER — Ambulatory Visit: Payer: Self-pay | Admitting: Unknown Physician Specialty

## 2012-11-10 LAB — CBC CANCER CENTER
Basophil %: 0.3 %
Eosinophil #: 0.2 x10 3/mm (ref 0.0–0.7)
HCT: 35.2 % — ABNORMAL LOW (ref 40.0–52.0)
HGB: 11.5 g/dL — ABNORMAL LOW (ref 13.0–18.0)
Lymphocyte #: 1.9 x10 3/mm (ref 1.0–3.6)
Lymphocyte %: 28 %
MCH: 28.9 pg (ref 26.0–34.0)
MCHC: 32.6 g/dL (ref 32.0–36.0)
MCV: 89 fL (ref 80–100)
Monocyte #: 0.6 x10 3/mm (ref 0.2–1.0)
Monocyte %: 9.1 %
RBC: 3.97 10*6/uL — ABNORMAL LOW (ref 4.40–5.90)
WBC: 6.7 x10 3/mm (ref 3.8–10.6)

## 2012-11-15 IMAGING — CR DG CHEST 2V
1 series · 2 of 2 positions shown · non-contrast
Comparison: none

REASON FOR EXAM: Shortness of Breath
COMMENTS:   May transport without cardiac monitor

PROCEDURE:     DXR - DXR CHEST PA (OR AP) AND LATERAL  - April 27, 2012  [DATE]
RESULT:     Comparison: None

[Series 1: pa · 0.17mm/px · 2 of 2 slices shown]
[im 1/2]
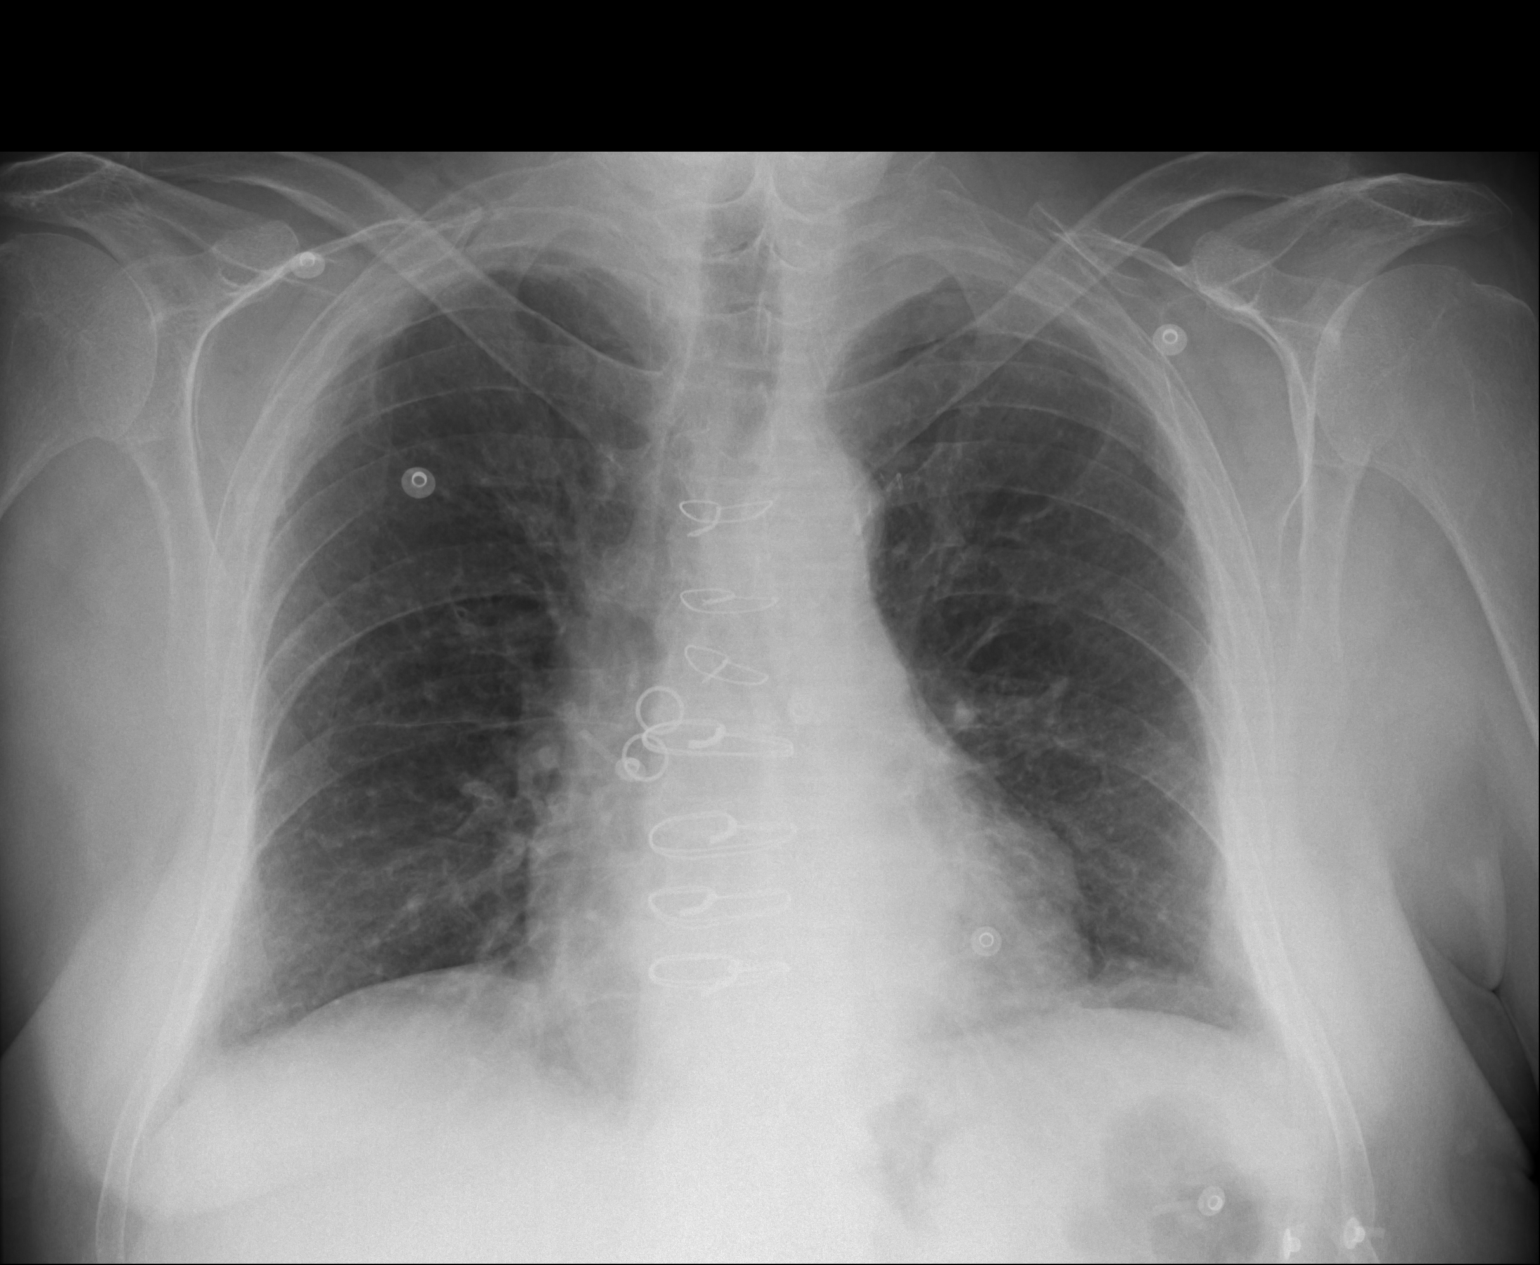
[im 2/2]
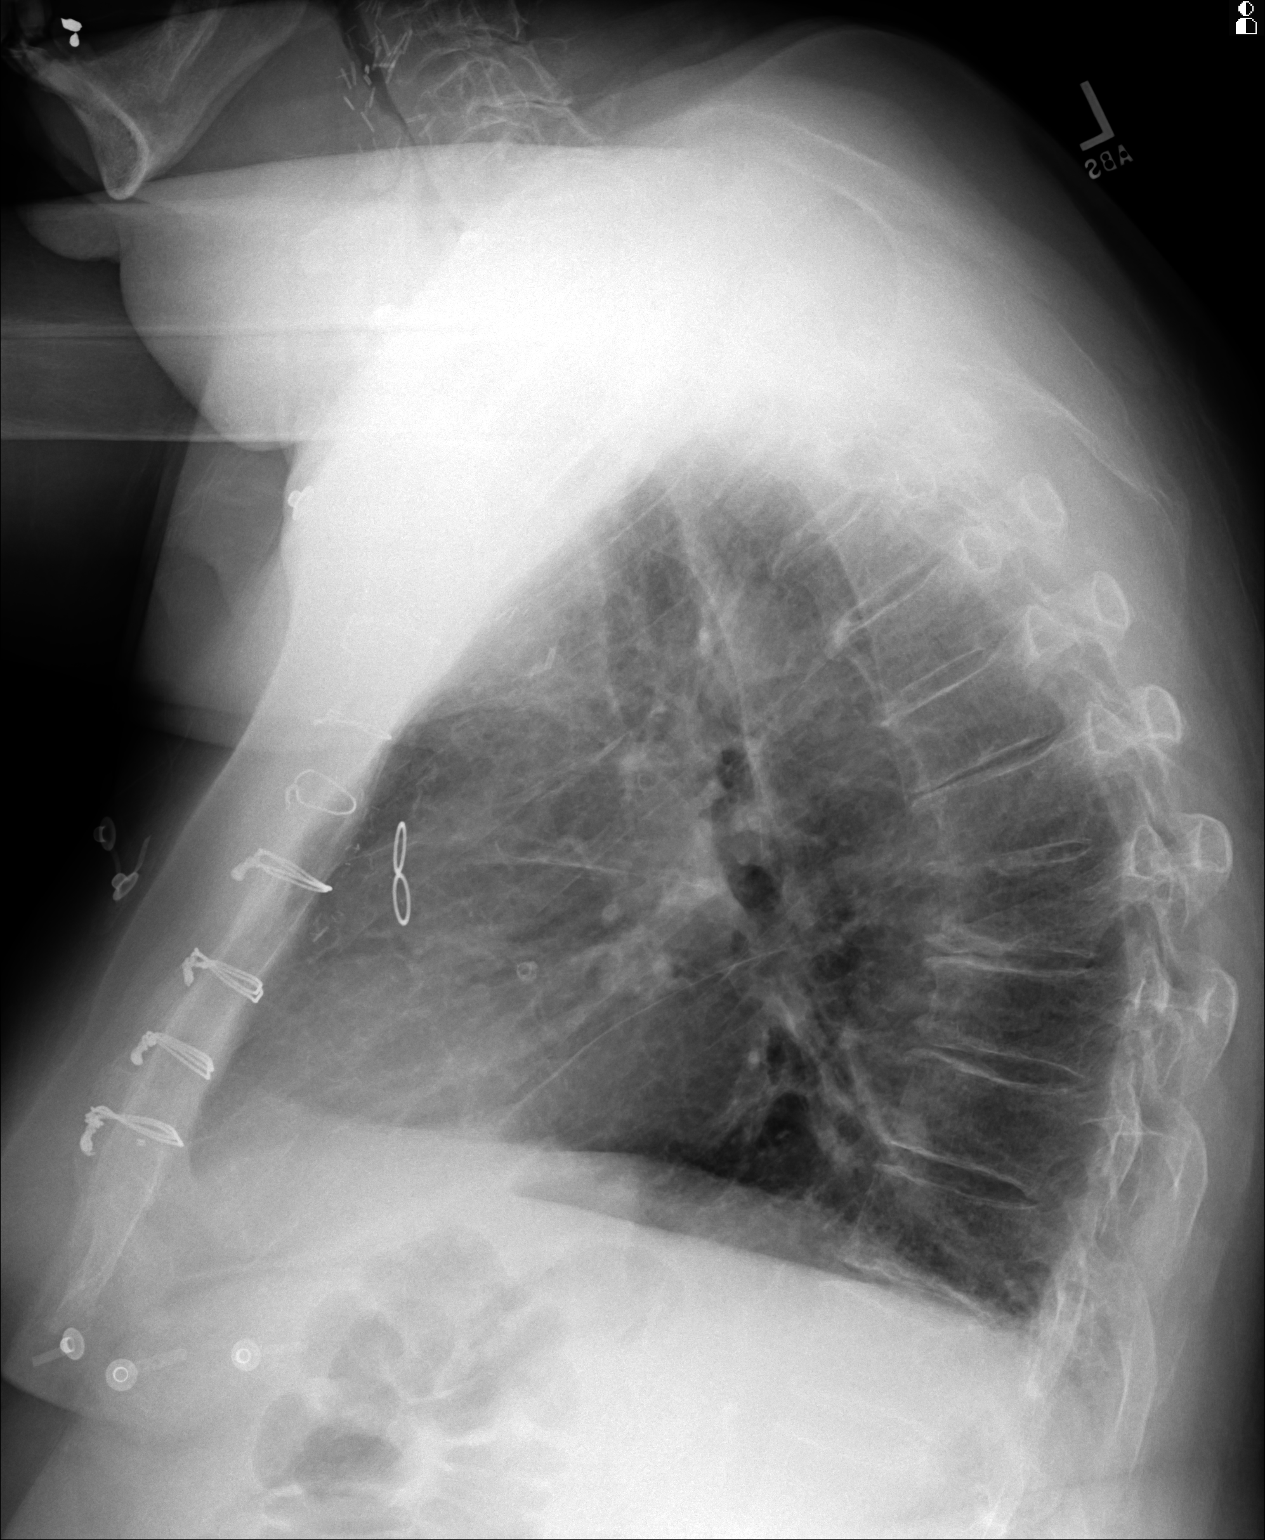

[2 of 2 positions shown; findings below may reference images not displayed]

FINDINGS: PA and lateral chest radiographs are provided. There is no focal parenchymal
opacity, pleural effusion, or pneumothorax. The heart size is normal. There
is evidence of prior CABG..  The osseous structures are unremarkable.
IMPRESSION: No acute disease of the che[REDACTED]

## 2012-11-17 ENCOUNTER — Ambulatory Visit: Payer: Self-pay | Admitting: Internal Medicine

## 2012-11-17 ENCOUNTER — Inpatient Hospital Stay: Payer: Self-pay | Admitting: Internal Medicine

## 2012-11-17 LAB — BASIC METABOLIC PANEL
Anion Gap: 9 (ref 7–16)
BUN: 22 mg/dL — ABNORMAL HIGH (ref 7–18)
Calcium, Total: 8.7 mg/dL (ref 8.5–10.1)
Co2: 28 mmol/L (ref 21–32)
Creatinine: 1.08 mg/dL (ref 0.60–1.30)
Osmolality: 285 (ref 275–301)

## 2012-11-17 LAB — PRO B NATRIURETIC PEPTIDE: B-Type Natriuretic Peptide: 2140 pg/mL — ABNORMAL HIGH (ref 0–125)

## 2012-11-17 LAB — CBC
HGB: 11.6 g/dL — ABNORMAL LOW (ref 13.0–18.0)
MCV: 89 fL (ref 80–100)
Platelet: 188 10*3/uL (ref 150–440)
RDW: 15.9 % — ABNORMAL HIGH (ref 11.5–14.5)
WBC: 13.3 10*3/uL — ABNORMAL HIGH (ref 3.8–10.6)

## 2012-11-17 LAB — CK TOTAL AND CKMB (NOT AT ARMC)
CK, Total: 24 U/L — ABNORMAL LOW (ref 35–232)
CK-MB: <0.5 ng/mL — ABNORMAL LOW (ref 0.5–3.6)

## 2012-11-17 LAB — TROPONIN I: Troponin-I: 0.03 ng/mL

## 2012-11-18 DIAGNOSIS — I369 Nonrheumatic tricuspid valve disorder, unspecified: Secondary | ICD-10-CM

## 2012-11-18 DIAGNOSIS — J984 Other disorders of lung: Secondary | ICD-10-CM

## 2012-11-18 DIAGNOSIS — I4892 Unspecified atrial flutter: Secondary | ICD-10-CM

## 2012-11-18 LAB — MAGNESIUM: Magnesium: 2.1 mg/dL

## 2012-11-18 LAB — BASIC METABOLIC PANEL
Anion Gap: 9 (ref 7–16)
BUN: 31 mg/dL — ABNORMAL HIGH (ref 7–18)
Calcium, Total: 8.7 mg/dL (ref 8.5–10.1)
Chloride: 102 mmol/L (ref 98–107)
Co2: 27 mmol/L (ref 21–32)
EGFR (Non-African Amer.): 55 — ABNORMAL LOW
Glucose: 207 mg/dL — ABNORMAL HIGH (ref 65–99)
Sodium: 138 mmol/L (ref 136–145)

## 2012-11-18 LAB — HEMOGLOBIN A1C: Hemoglobin A1C: 5.8 % (ref 4.2–6.3)

## 2012-11-18 LAB — URINALYSIS, COMPLETE
Bacteria: NONE SEEN
Blood: NEGATIVE
Glucose,UR: 50 mg/dL (ref 0–75)
Leukocyte Esterase: NEGATIVE
Nitrite: NEGATIVE
Ph: 5 (ref 4.5–8.0)
Protein: NEGATIVE
RBC,UR: NONE SEEN /HPF (ref 0–5)
Squamous Epithelial: <1
WBC UR: <1 /HPF (ref 0–5)

## 2012-11-18 LAB — CBC WITH DIFFERENTIAL/PLATELET
Eosinophil #: 0 10*3/uL (ref 0.0–0.7)
Lymphocyte #: 1 10*3/uL (ref 1.0–3.6)
MCV: 88 fL (ref 80–100)
Monocyte #: 0.4 x10 3/mm (ref 0.2–1.0)
Monocyte %: 3.7 %
Neutrophil %: 85.9 %
Platelet: 183 10*3/uL (ref 150–440)
RDW: 15.9 % — ABNORMAL HIGH (ref 11.5–14.5)
WBC: 9.7 10*3/uL (ref 3.8–10.6)

## 2012-11-18 LAB — LIPID PANEL
HDL Cholesterol: 59 mg/dL (ref 40–60)
Ldl Cholesterol, Calc: 84 mg/dL (ref 0–100)
VLDL Cholesterol, Calc: 23 mg/dL (ref 5–40)

## 2012-11-19 LAB — CBC WITH DIFFERENTIAL/PLATELET
Basophil #: 0.2 10*3/uL — ABNORMAL HIGH (ref 0.0–0.1)
Eosinophil #: 0 10*3/uL (ref 0.0–0.7)
Eosinophil %: 0 %
Lymphocyte #: 1.3 10*3/uL (ref 1.0–3.6)
Lymphocyte %: 5.9 %
MCHC: 32.5 g/dL (ref 32.0–36.0)
MCV: 88 fL (ref 80–100)
Monocyte #: 0.5 x10 3/mm (ref 0.2–1.0)
Monocyte %: 2.2 %
Neutrophil #: 19.7 10*3/uL — ABNORMAL HIGH (ref 1.4–6.5)
Platelet: 200 10*3/uL (ref 150–440)
RBC: 4.19 10*6/uL — ABNORMAL LOW (ref 4.40–5.90)

## 2012-11-19 LAB — BASIC METABOLIC PANEL
BUN: 36 mg/dL — ABNORMAL HIGH (ref 7–18)
Calcium, Total: 8.5 mg/dL (ref 8.5–10.1)
Chloride: 101 mmol/L (ref 98–107)
Co2: 25 mmol/L (ref 21–32)
Creatinine: 1.17 mg/dL (ref 0.60–1.30)
EGFR (African American): >60
Glucose: 174 mg/dL — ABNORMAL HIGH (ref 65–99)
Osmolality: 286 (ref 275–301)
Potassium: 4.6 mmol/L (ref 3.5–5.1)
Sodium: 137 mmol/L (ref 136–145)

## 2012-11-19 LAB — APTT
Activated PTT: 133.9 secs — ABNORMAL HIGH (ref 23.6–35.9)
Activated PTT: 135.6 secs — ABNORMAL HIGH (ref 23.6–35.9)

## 2012-11-20 DIAGNOSIS — I4892 Unspecified atrial flutter: Secondary | ICD-10-CM

## 2012-11-21 DIAGNOSIS — J984 Other disorders of lung: Secondary | ICD-10-CM

## 2012-11-22 LAB — CBC WITH DIFFERENTIAL/PLATELET
Bands: 1 %
HCT: 36.6 % — ABNORMAL LOW (ref 40.0–52.0)
Lymphocytes: 8 %
MCH: 29.4 pg (ref 26.0–34.0)
MCHC: 33.6 g/dL (ref 32.0–36.0)
MCV: 88 fL (ref 80–100)
Monocytes: 8 %
Platelet: 176 10*3/uL (ref 150–440)
RBC: 4.18 10*6/uL — ABNORMAL LOW (ref 4.40–5.90)
RDW: 15.2 % — ABNORMAL HIGH (ref 11.5–14.5)
WBC: 16.6 10*3/uL — ABNORMAL HIGH (ref 3.8–10.6)

## 2012-11-22 LAB — URINALYSIS, COMPLETE
Bacteria: NONE SEEN
Glucose,UR: NEGATIVE mg/dL (ref 0–75)
Ketone: NEGATIVE
Nitrite: NEGATIVE
Protein: NEGATIVE
Specific Gravity: 1.016 (ref 1.003–1.030)
Squamous Epithelial: <1
WBC UR: 2 /HPF (ref 0–5)

## 2012-11-22 LAB — BASIC METABOLIC PANEL
Anion Gap: 5 — ABNORMAL LOW (ref 7–16)
BUN: 38 mg/dL — ABNORMAL HIGH (ref 7–18)
Calcium, Total: 8.2 mg/dL — ABNORMAL LOW (ref 8.5–10.1)
Chloride: 103 mmol/L (ref 98–107)
Glucose: 150 mg/dL — ABNORMAL HIGH (ref 65–99)
Osmolality: 288 (ref 275–301)
Potassium: 4.5 mmol/L (ref 3.5–5.1)

## 2012-11-23 ENCOUNTER — Other Ambulatory Visit: Payer: Self-pay

## 2012-11-23 MED ORDER — RIVAROXABAN 20 MG PO TABS: 20.0000 mg | ORAL_TABLET | Freq: Every day | ORAL | Status: DC

## 2012-11-23 NOTE — Telephone Encounter (Signed)
Leave samples of Xarelto 20 mg qd tablets at front desk for pt to pick up. Being discharged today" VO Dr. Alvis Lemmings, RN  Confirmed dose via hospital records as well  Placed samples at [facility]

## 2012-11-24 LAB — CBC WITH DIFFERENTIAL/PLATELET
Basophil #: 0 10*3/uL (ref 0.0–0.1)
Eosinophil %: 0 %
HCT: 36.6 % — ABNORMAL LOW (ref 40.0–52.0)
HGB: 11.9 g/dL — ABNORMAL LOW (ref 13.0–18.0)
Lymphocyte %: 5.1 %
MCH: 28.7 pg (ref 26.0–34.0)
MCHC: 32.6 g/dL (ref 32.0–36.0)
MCV: 88 fL (ref 80–100)
Monocyte #: 0.4 x10 3/mm (ref 0.2–1.0)
Monocyte %: 2.7 %
Neutrophil #: 12.9 10*3/uL — ABNORMAL HIGH (ref 1.4–6.5)
Neutrophil %: 92 %
RBC: 4.15 10*6/uL — ABNORMAL LOW (ref 4.40–5.90)
RDW: 15.9 % — ABNORMAL HIGH (ref 11.5–14.5)
WBC: 14.1 10*3/uL — ABNORMAL HIGH (ref 3.8–10.6)

## 2012-11-24 LAB — HEMOGLOBIN A1C: Hemoglobin A1C: 6 % (ref 4.2–6.3)

## 2012-11-26 ENCOUNTER — Inpatient Hospital Stay: Payer: Self-pay | Admitting: Student

## 2012-11-26 LAB — CBC
MCH: 28.6 pg (ref 26.0–34.0)
MCHC: 32 g/dL (ref 32.0–36.0)
Platelet: 191 10*3/uL (ref 150–440)
RDW: 16.7 % — ABNORMAL HIGH (ref 11.5–14.5)
WBC: 24.5 10*3/uL — ABNORMAL HIGH (ref 3.8–10.6)

## 2012-11-26 LAB — URINALYSIS, COMPLETE
Bacteria: NONE SEEN
Glucose,UR: NEGATIVE mg/dL (ref 0–75)
Ketone: NEGATIVE
Nitrite: NEGATIVE
Protein: 100
Specific Gravity: 1.025 (ref 1.003–1.030)
WBC UR: 18 /HPF (ref 0–5)

## 2012-11-26 LAB — COMPREHENSIVE METABOLIC PANEL
Albumin: 2.8 g/dL — ABNORMAL LOW (ref 3.4–5.0)
BUN: 42 mg/dL — ABNORMAL HIGH (ref 7–18)
Co2: 27 mmol/L (ref 21–32)
Creatinine: 1.26 mg/dL (ref 0.60–1.30)
EGFR (African American): >60
EGFR (Non-African Amer.): 56 — ABNORMAL LOW
Glucose: 215 mg/dL — ABNORMAL HIGH (ref 65–99)
Osmolality: 287 (ref 275–301)
SGPT (ALT): 54 U/L (ref 12–78)

## 2012-11-26 LAB — APTT: Activated PTT: 24.8 secs (ref 23.6–35.9)

## 2012-11-26 LAB — PROTIME-INR: Prothrombin Time: 15.8 secs — ABNORMAL HIGH (ref 11.5–14.7)

## 2012-11-27 DIAGNOSIS — J9620 Acute and chronic respiratory failure, unspecified whether with hypoxia or hypercapnia: Secondary | ICD-10-CM

## 2012-11-27 DIAGNOSIS — I5033 Acute on chronic diastolic (congestive) heart failure: Secondary | ICD-10-CM

## 2012-11-27 LAB — CBC WITH DIFFERENTIAL/PLATELET
Basophil #: 0 10*3/uL (ref 0.0–0.1)
Eosinophil #: 0 10*3/uL (ref 0.0–0.7)
Eosinophil %: 0 %
HCT: 36 % — ABNORMAL LOW (ref 40.0–52.0)
Lymphocyte #: 1 10*3/uL (ref 1.0–3.6)
Lymphocyte %: 5.5 %
MCHC: 33.1 g/dL (ref 32.0–36.0)
Monocyte #: 1.1 x10 3/mm — ABNORMAL HIGH (ref 0.2–1.0)
Neutrophil %: 88.3 %
Platelet: 179 10*3/uL (ref 150–440)
RBC: 4.09 10*6/uL — ABNORMAL LOW (ref 4.40–5.90)
WBC: 18 10*3/uL — ABNORMAL HIGH (ref 3.8–10.6)

## 2012-11-27 LAB — BASIC METABOLIC PANEL
Anion Gap: 6 — ABNORMAL LOW (ref 7–16)
BUN: 41 mg/dL — ABNORMAL HIGH (ref 7–18)
Calcium, Total: 7.8 mg/dL — ABNORMAL LOW (ref 8.5–10.1)
Chloride: 101 mmol/L (ref 98–107)
Co2: 28 mmol/L (ref 21–32)
Creatinine: 1.11 mg/dL (ref 0.60–1.30)
Glucose: 163 mg/dL — ABNORMAL HIGH (ref 65–99)
Osmolality: 284 (ref 275–301)

## 2012-11-27 LAB — TROPONIN I: Troponin-I: 0.03 ng/mL

## 2012-11-28 DIAGNOSIS — I4892 Unspecified atrial flutter: Secondary | ICD-10-CM

## 2012-11-28 LAB — URINE CULTURE

## 2012-11-29 ENCOUNTER — Ambulatory Visit: Payer: Self-pay | Admitting: Urology

## 2012-11-29 LAB — CBC WITH DIFFERENTIAL/PLATELET
Basophil #: 0 10*3/uL (ref 0.0–0.1)
Basophil %: 0.1 %
HGB: 11.5 g/dL — ABNORMAL LOW (ref 13.0–18.0)
Lymphocyte #: 1.6 10*3/uL (ref 1.0–3.6)
Lymphocyte %: 8.8 %
MCH: 28.1 pg (ref 26.0–34.0)
MCHC: 32 g/dL (ref 32.0–36.0)
Monocyte #: 1.3 x10 3/mm — ABNORMAL HIGH (ref 0.2–1.0)
Neutrophil #: 15.3 10*3/uL — ABNORMAL HIGH (ref 1.4–6.5)
RBC: 4.1 10*6/uL — ABNORMAL LOW (ref 4.40–5.90)

## 2012-11-29 LAB — BASIC METABOLIC PANEL
Anion Gap: 6 — ABNORMAL LOW (ref 7–16)
Calcium, Total: 8.1 mg/dL — ABNORMAL LOW (ref 8.5–10.1)
EGFR (Non-African Amer.): 43 — ABNORMAL LOW
Glucose: 163 mg/dL — ABNORMAL HIGH (ref 65–99)
Sodium: 133 mmol/L — ABNORMAL LOW (ref 136–145)

## 2012-11-29 LAB — MAGNESIUM: Magnesium: 2.3 mg/dL

## 2012-11-30 LAB — BASIC METABOLIC PANEL
BUN: 38 mg/dL — ABNORMAL HIGH (ref 7–18)
Calcium, Total: 8.1 mg/dL — ABNORMAL LOW (ref 8.5–10.1)
Chloride: 99 mmol/L (ref 98–107)
Creatinine: 1.19 mg/dL (ref 0.60–1.30)
EGFR (African American): >60
EGFR (Non-African Amer.): 60 — ABNORMAL LOW
Osmolality: 278 (ref 275–301)
Potassium: 4.4 mmol/L (ref 3.5–5.1)
Sodium: 133 mmol/L — ABNORMAL LOW (ref 136–145)

## 2012-12-01 ENCOUNTER — Telehealth: Payer: Self-pay

## 2012-12-01 NOTE — Telephone Encounter (Signed)
Per Dr. Mariah Milling, he is aware and was involved in his care

## 2012-12-01 NOTE — Telephone Encounter (Signed)
Pt informed Understanding verb 

## 2012-12-01 NOTE — Telephone Encounter (Signed)
Pt daughter called and states he was give 2 meds Amiodarane 200 mg, Levofloxacin250 mg in the hospital yesterday, wanted to check with dr Mariah Milling to see if it is ok he continue to take these

## 2012-12-02 LAB — CULTURE, BLOOD (SINGLE)

## 2012-12-04 ENCOUNTER — Inpatient Hospital Stay: Payer: Self-pay | Admitting: Internal Medicine

## 2012-12-04 LAB — COMPREHENSIVE METABOLIC PANEL
Alkaline Phosphatase: 39 U/L — ABNORMAL LOW (ref 50–136)
Anion Gap: 4 — ABNORMAL LOW (ref 7–16)
Bilirubin,Total: 0.5 mg/dL (ref 0.2–1.0)
Calcium, Total: 8.2 mg/dL — ABNORMAL LOW (ref 8.5–10.1)
Co2: 29 mmol/L (ref 21–32)
Creatinine: 1.42 mg/dL — ABNORMAL HIGH (ref 0.60–1.30)
EGFR (African American): 56 — ABNORMAL LOW
EGFR (Non-African Amer.): 48 — ABNORMAL LOW
Glucose: 109 mg/dL — ABNORMAL HIGH (ref 65–99)
Osmolality: 271 (ref 275–301)
SGOT(AST): 16 U/L (ref 15–37)
SGPT (ALT): 25 U/L (ref 12–78)
Sodium: 132 mmol/L — ABNORMAL LOW (ref 136–145)
Total Protein: 6.2 g/dL — ABNORMAL LOW (ref 6.4–8.2)

## 2012-12-04 LAB — PRO B NATRIURETIC PEPTIDE: B-Type Natriuretic Peptide: 1404 pg/mL — ABNORMAL HIGH (ref 0–125)

## 2012-12-04 LAB — DIFFERENTIAL
Basophil #: 0 10*3/uL (ref 0.0–0.1)
Basophil %: 0.2 %
Eosinophil #: 0 10*3/uL (ref 0.0–0.7)
Eosinophil %: 0 %
Lymphocyte #: 0.7 10*3/uL — ABNORMAL LOW (ref 1.0–3.6)
Monocyte #: 1.3 x10 3/mm — ABNORMAL HIGH (ref 0.2–1.0)
Monocyte %: 5.4 %
Neutrophil #: 22.4 10*3/uL — ABNORMAL HIGH (ref 1.4–6.5)
Neutrophil %: 91.7 %

## 2012-12-04 LAB — CBC
HCT: 28.6 % — ABNORMAL LOW (ref 40.0–52.0)
HGB: 9.5 g/dL — ABNORMAL LOW (ref 13.0–18.0)
MCH: 28.6 pg (ref 26.0–34.0)
MCV: 87 fL (ref 80–100)
RDW: 16.6 % — ABNORMAL HIGH (ref 11.5–14.5)
WBC: 18.3 10*3/uL — ABNORMAL HIGH (ref 3.8–10.6)

## 2012-12-04 LAB — CK TOTAL AND CKMB (NOT AT ARMC): CK, Total: 14 U/L — ABNORMAL LOW (ref 35–232)

## 2012-12-05 ENCOUNTER — Ambulatory Visit: Payer: Self-pay | Admitting: Urology

## 2012-12-05 LAB — COMPREHENSIVE METABOLIC PANEL
Albumin: 2.4 g/dL — ABNORMAL LOW (ref 3.4–5.0)
Alkaline Phosphatase: 35 U/L — ABNORMAL LOW (ref 50–136)
Calcium, Total: 8 mg/dL — ABNORMAL LOW (ref 8.5–10.1)
Chloride: 100 mmol/L (ref 98–107)
EGFR (African American): 60 — ABNORMAL LOW
EGFR (Non-African Amer.): 51 — ABNORMAL LOW
Glucose: 99 mg/dL (ref 65–99)
Osmolality: 275 (ref 275–301)
Potassium: 3.9 mmol/L (ref 3.5–5.1)
SGPT (ALT): 24 U/L (ref 12–78)
Sodium: 135 mmol/L — ABNORMAL LOW (ref 136–145)

## 2012-12-05 LAB — CBC WITH DIFFERENTIAL/PLATELET
Basophil #: 0 10*3/uL (ref 0.0–0.1)
Basophil %: 0.3 %
Eosinophil #: 0.1 10*3/uL (ref 0.0–0.7)
Eosinophil %: 0.6 %
Lymphocyte #: 1.4 10*3/uL (ref 1.0–3.6)
Lymphocyte %: 9.8 %
MCH: 29.4 pg (ref 26.0–34.0)
Monocyte #: 0.8 x10 3/mm (ref 0.2–1.0)
Monocytes: 2 %
Platelet: 97 10*3/uL — ABNORMAL LOW (ref 150–440)
RBC: 3.03 10*6/uL — ABNORMAL LOW (ref 4.40–5.90)
RDW: 17 % — ABNORMAL HIGH (ref 11.5–14.5)
Segmented Neutrophils: 85 %
WBC: 14.6 10*3/uL — ABNORMAL HIGH (ref 3.8–10.6)

## 2012-12-05 LAB — RETICULOCYTES
Absolute Retic Count: 0.0491 10*6/uL
Reticulocyte: 1.43 %

## 2012-12-05 LAB — IRON AND TIBC
Iron Bind.Cap.(Total): 271 ug/dL (ref 250–450)
Iron Saturation: 8 %
Iron: 21 ug/dL — ABNORMAL LOW (ref 65–175)
Unbound Iron-Bind.Cap.: 250 ug/dL

## 2012-12-05 LAB — FERRITIN: Ferritin (ARMC): 241 ng/mL (ref 8–388)

## 2012-12-05 LAB — FIBRIN DEGRADATION PROD.(ARMC ONLY): Fibrin Degradation Prod.: <10 ug/ml (ref 2.1–7.7)

## 2012-12-05 LAB — CK TOTAL AND CKMB (NOT AT ARMC)
CK, Total: 13 U/L — ABNORMAL LOW (ref 35–232)
CK, Total: 15 U/L — ABNORMAL LOW (ref 35–232)
CK-MB: <0.5 ng/mL — ABNORMAL LOW (ref 0.5–3.6)

## 2012-12-05 LAB — LACTATE DEHYDROGENASE: LDH: 248 U/L — ABNORMAL HIGH (ref 85–241)

## 2012-12-05 LAB — PROTIME-INR: INR: 1.1

## 2012-12-05 LAB — APTT: Activated PTT: 29 secs (ref 23.6–35.9)

## 2012-12-05 LAB — TROPONIN I: Troponin-I: <0.02 ng/mL

## 2012-12-06 DIAGNOSIS — I4891 Unspecified atrial fibrillation: Secondary | ICD-10-CM

## 2012-12-06 DIAGNOSIS — I959 Hypotension, unspecified: Secondary | ICD-10-CM

## 2012-12-06 DIAGNOSIS — I5033 Acute on chronic diastolic (congestive) heart failure: Secondary | ICD-10-CM

## 2012-12-06 LAB — BASIC METABOLIC PANEL
Anion Gap: 7 (ref 7–16)
BUN: 29 mg/dL — ABNORMAL HIGH (ref 7–18)
Chloride: 92 mmol/L — ABNORMAL LOW (ref 98–107)
Co2: 31 mmol/L (ref 21–32)
EGFR (Non-African Amer.): 42 — ABNORMAL LOW
Glucose: 112 mg/dL — ABNORMAL HIGH (ref 65–99)
Osmolality: 267 (ref 275–301)
Potassium: 3.6 mmol/L (ref 3.5–5.1)
Sodium: 130 mmol/L — ABNORMAL LOW (ref 136–145)

## 2012-12-06 LAB — CBC WITH DIFFERENTIAL/PLATELET
Basophil %: 0.7 %
Eosinophil #: 0.1 10*3/uL (ref 0.0–0.7)
Eosinophil %: 0.4 %
HGB: 10.3 g/dL — ABNORMAL LOW (ref 13.0–18.0)
Lymphocyte #: 1.6 10*3/uL (ref 1.0–3.6)
Lymphocyte %: 9.6 %
MCH: 28.4 pg (ref 26.0–34.0)
MCHC: 32.7 g/dL (ref 32.0–36.0)
Monocyte #: 0.9 x10 3/mm (ref 0.2–1.0)
Monocyte %: 5.3 %
Neutrophil %: 84 %
RBC: 3.64 10*6/uL — ABNORMAL LOW (ref 4.40–5.90)
WBC: 16.5 10*3/uL — ABNORMAL HIGH (ref 3.8–10.6)

## 2012-12-06 LAB — TROPONIN I
Troponin-I: <0.02 ng/mL
Troponin-I: <0.02 ng/mL

## 2012-12-07 DIAGNOSIS — I4891 Unspecified atrial fibrillation: Secondary | ICD-10-CM

## 2012-12-07 LAB — CBC WITH DIFFERENTIAL/PLATELET
Eosinophil #: 0 10*3/uL (ref 0.0–0.7)
Eosinophil %: 0 %
HCT: 29.5 % — ABNORMAL LOW (ref 40.0–52.0)
HGB: 9.9 g/dL — ABNORMAL LOW (ref 13.0–18.0)
Lymphocyte #: 0.5 10*3/uL — ABNORMAL LOW (ref 1.0–3.6)
Lymphocyte %: 4.4 %
MCH: 29.3 pg (ref 26.0–34.0)
MCHC: 33.6 g/dL (ref 32.0–36.0)
MCV: 87 fL (ref 80–100)
Monocyte #: 0.3 x10 3/mm (ref 0.2–1.0)
Platelet: 98 10*3/uL — ABNORMAL LOW (ref 150–440)
WBC: 11.3 10*3/uL — ABNORMAL HIGH (ref 3.8–10.6)

## 2012-12-07 LAB — BASIC METABOLIC PANEL
BUN: 28 mg/dL — ABNORMAL HIGH (ref 7–18)
Calcium, Total: 8.5 mg/dL (ref 8.5–10.1)
Co2: 28 mmol/L (ref 21–32)
Creatinine: 1.4 mg/dL — ABNORMAL HIGH (ref 0.60–1.30)
Osmolality: 273 (ref 275–301)

## 2012-12-08 DIAGNOSIS — I4891 Unspecified atrial fibrillation: Secondary | ICD-10-CM

## 2012-12-11 ENCOUNTER — Encounter: Payer: Medicare Other | Admitting: Cardiovascular Disease

## 2012-12-11 LAB — CREATININE, SERUM
Creatinine: 1.25 mg/dL (ref 0.60–1.30)
EGFR (African American): >60
EGFR (Non-African Amer.): 56 — ABNORMAL LOW

## 2012-12-11 LAB — VANCOMYCIN, TROUGH: Vancomycin, Trough: 13 ug/mL (ref 10–20)

## 2012-12-13 ENCOUNTER — Observation Stay: Payer: Self-pay | Admitting: Family Medicine

## 2012-12-13 LAB — BASIC METABOLIC PANEL
BUN: 38 mg/dL — ABNORMAL HIGH (ref 7–18)
Calcium, Total: 8 mg/dL — ABNORMAL LOW (ref 8.5–10.1)
Chloride: 99 mmol/L (ref 98–107)
Co2: 28 mmol/L (ref 21–32)
EGFR (African American): 57 — ABNORMAL LOW
EGFR (Non-African Amer.): 49 — ABNORMAL LOW
Glucose: 163 mg/dL — ABNORMAL HIGH (ref 65–99)
Potassium: 4.3 mmol/L (ref 3.5–5.1)
Sodium: 136 mmol/L (ref 136–145)

## 2012-12-13 LAB — CBC
HCT: 29.2 % — ABNORMAL LOW (ref 40.0–52.0)
MCHC: 32.8 g/dL (ref 32.0–36.0)
MCV: 89 fL (ref 80–100)
Platelet: 271 10*3/uL (ref 150–440)
RBC: 3.29 10*6/uL — ABNORMAL LOW (ref 4.40–5.90)
WBC: 8.9 10*3/uL (ref 3.8–10.6)

## 2012-12-13 LAB — CK TOTAL AND CKMB (NOT AT ARMC)
CK, Total: 15 U/L — ABNORMAL LOW (ref 35–232)
CK-MB: 0.5 ng/mL (ref 0.5–3.6)
CK-MB: <0.5 ng/mL — ABNORMAL LOW (ref 0.5–3.6)

## 2012-12-14 DIAGNOSIS — R079 Chest pain, unspecified: Secondary | ICD-10-CM

## 2012-12-14 DIAGNOSIS — I959 Hypotension, unspecified: Secondary | ICD-10-CM

## 2012-12-14 LAB — CBC WITH DIFFERENTIAL/PLATELET
Basophil #: 0 10*3/uL (ref 0.0–0.1)
Basophil %: 0.1 %
Eosinophil %: 0.8 %
HCT: 28.7 % — ABNORMAL LOW (ref 40.0–52.0)
HGB: 9.6 g/dL — ABNORMAL LOW (ref 13.0–18.0)
Lymphocyte %: 22.3 %
MCH: 29.2 pg (ref 26.0–34.0)
MCHC: 33.3 g/dL (ref 32.0–36.0)
Monocyte #: 0.8 x10 3/mm (ref 0.2–1.0)
Monocyte %: 10.9 %
Platelet: 243 10*3/uL (ref 150–440)
RDW: 17.3 % — ABNORMAL HIGH (ref 11.5–14.5)
WBC: 7.2 10*3/uL (ref 3.8–10.6)

## 2012-12-14 LAB — URINALYSIS, COMPLETE
Glucose,UR: NEGATIVE mg/dL (ref 0–75)
RBC,UR: 1 /HPF (ref 0–5)

## 2012-12-14 LAB — BASIC METABOLIC PANEL
BUN: 33 mg/dL — ABNORMAL HIGH (ref 7–18)
Calcium, Total: 8.2 mg/dL — ABNORMAL LOW (ref 8.5–10.1)
Co2: 29 mmol/L (ref 21–32)
Creatinine: 1.19 mg/dL (ref 0.60–1.30)
EGFR (African American): >60
Potassium: 3.9 mmol/L (ref 3.5–5.1)

## 2012-12-14 LAB — CK TOTAL AND CKMB (NOT AT ARMC): CK, Total: 13 U/L — ABNORMAL LOW (ref 35–232)

## 2012-12-15 ENCOUNTER — Telehealth: Payer: Self-pay

## 2012-12-15 LAB — CBC WITH DIFFERENTIAL/PLATELET
HGB: 9.6 g/dL — ABNORMAL LOW (ref 13.0–18.0)
Lymphocytes: 23 %
MCHC: 33 g/dL (ref 32.0–36.0)
MCV: 87 fL (ref 80–100)
Metamyelocyte: 1 %
Monocytes: 5 %
Myelocyte: 2 %
Platelet: 258 10*3/uL (ref 150–440)
RBC: 3.33 10*6/uL — ABNORMAL LOW (ref 4.40–5.90)

## 2012-12-15 LAB — BASIC METABOLIC PANEL
Anion Gap: 7 (ref 7–16)
BUN: 23 mg/dL — ABNORMAL HIGH (ref 7–18)
Calcium, Total: 8.1 mg/dL — ABNORMAL LOW (ref 8.5–10.1)
Chloride: 100 mmol/L (ref 98–107)
Co2: 30 mmol/L (ref 21–32)
Creatinine: 1.25 mg/dL (ref 0.60–1.30)
EGFR (African American): >60
EGFR (Non-African Amer.): 56 — ABNORMAL LOW
Osmolality: 276 (ref 275–301)
Potassium: 3.7 mmol/L (ref 3.5–5.1)

## 2012-12-15 LAB — URINE CULTURE

## 2012-12-15 NOTE — Telephone Encounter (Signed)
TCM  

## 2012-12-15 NOTE — Telephone Encounter (Signed)
Message copied by [facility], Norton Bivins E on Tue Dec 15, 2012  4:20 PM ------      Message from: Thersa Salt      Created: Tue Dec 15, 2012  4:16 PM      Regarding: TCM       APPT 5/6 WITH GOLLAN ------

## 2012-12-16 NOTE — Telephone Encounter (Signed)
TCM attempt #1 LMTCB

## 2012-12-17 ENCOUNTER — Ambulatory Visit: Payer: Self-pay | Admitting: Internal Medicine

## 2012-12-17 NOTE — Telephone Encounter (Signed)
TCM #2 Patient contacted regarding discharge from [facility] on 12/15/12.  Patient understands to follow up with provider PA on 12/22/12 at 1130 at [place] office. Patient understands discharge instructions? Is at [facility] Facility Patient understands medications and regiment? [facility] is managing Patient understands to bring all medications to this visit? yes  I spoke with wife who says pt is in rehab facility BP is maintaining normal Participating in PT this am Medications managed by rehab

## 2012-12-22 ENCOUNTER — Encounter: Payer: Self-pay | Admitting: Cardiovascular Disease

## 2012-12-22 ENCOUNTER — Telehealth: Payer: Self-pay

## 2012-12-22 ENCOUNTER — Ambulatory Visit (INDEPENDENT_AMBULATORY_CARE_PROVIDER_SITE_OTHER): Payer: Medicare Other | Admitting: Cardiovascular Disease

## 2012-12-22 VITALS — BP 92/60 | HR 106 | Ht 73.0 in | Wt 240.2 lb

## 2012-12-22 DIAGNOSIS — R0602 Shortness of breath: Secondary | ICD-10-CM

## 2012-12-22 DIAGNOSIS — I25810 Atherosclerosis of coronary artery bypass graft(s) without angina pectoris: Secondary | ICD-10-CM

## 2012-12-22 DIAGNOSIS — R079 Chest pain, unspecified: Secondary | ICD-10-CM

## 2012-12-22 DIAGNOSIS — I493 Ventricular premature depolarization: Secondary | ICD-10-CM

## 2012-12-22 DIAGNOSIS — I10 Essential (primary) hypertension: Secondary | ICD-10-CM

## 2012-12-22 DIAGNOSIS — I4949 Other premature depolarization: Secondary | ICD-10-CM

## 2012-12-22 DIAGNOSIS — J449 Chronic obstructive pulmonary disease, unspecified: Secondary | ICD-10-CM

## 2012-12-22 LAB — TROPONIN I
Troponin-I: <0.02 ng/mL
Troponin-I: <0.02 ng/mL

## 2012-12-22 LAB — BASIC METABOLIC PANEL
BUN: 25 mg/dL — ABNORMAL HIGH (ref 7–18)
Calcium, Total: 8.9 mg/dL (ref 8.5–10.1)
Chloride: 103 mmol/L (ref 98–107)
Co2: 28 mmol/L (ref 21–32)
Creatinine: 1.23 mg/dL (ref 0.60–1.30)
Osmolality: 279 (ref 275–301)
Potassium: 3.9 mmol/L (ref 3.5–5.1)

## 2012-12-22 LAB — CK TOTAL AND CKMB (NOT AT ARMC)
CK, Total: 18 U/L — ABNORMAL LOW (ref 35–232)
CK, Total: 18 U/L — ABNORMAL LOW (ref 35–232)
CK-MB: 0.6 ng/mL (ref 0.5–3.6)

## 2012-12-22 LAB — CBC
HCT: 30.4 % — ABNORMAL LOW (ref 40.0–52.0)
HGB: 9.9 g/dL — ABNORMAL LOW (ref 13.0–18.0)
MCH: 28.6 pg (ref 26.0–34.0)
MCHC: 32.7 g/dL (ref 32.0–36.0)
Platelet: 198 10*3/uL (ref 150–440)
RBC: 3.48 10*6/uL — ABNORMAL LOW (ref 4.40–5.90)
RDW: 17.5 % — ABNORMAL HIGH (ref 11.5–14.5)

## 2012-12-22 MED ORDER — RANOLAZINE ER 1000 MG PO TB12: 1000.0000 mg | ORAL_TABLET | Freq: Two times a day (BID) | ORAL | Status: DC

## 2012-12-22 MED ORDER — RANOLAZINE ER 500 MG PO TB12: ORAL_TABLET | ORAL | Status: DC

## 2012-12-22 NOTE — Progress Notes (Signed)
Javier [name] Date of Birth  09-Jan-1936       [facility]    [place] [address]  [address] [place], [place]  [address]   [place], [place]  [address] [phone]     [phone]   Fax  [phone]    Fax [phone]  Problem List: 1. CAD :  CABG - 2008 at [facility] 2. COPD 3. LVH   History of Present Illness:  Javier [name] is a 77 yo with frequent episodes of CP - multiple admissions to [facility] for CP.   He was last admitted to [facility] last week.  For myocardial infarction. He was seen here today for transitional care followup.  He felt well when he left his house this point. We arrived at the office he started having severe shortness of breath, wheezing, and chest pain. The nurses also found him to be hypertensive.  The pains are sharp, lasts 2-3 seconds.  Occurs every 20-30 seconds ( ? Related to his PVCs)  .  While the patient was up to the monitor the nurses noted that his episodes of pain or spot it to his premature ventricular contractions.    Current Outpatient Prescriptions on File Prior to Visit  Medication Sig Dispense Refill  . albuterol (VENTOLIN HFA) 108 (90 BASE) MCG/ACT inhaler Inhale 2 puffs into the lungs every 6 (six) hours as needed for wheezing.      Marland Kitchen ALPRAZolam (XANAX) 0.25 MG tablet Take 0.25 mg by mouth at bedtime as needed.      . budesonide-formoterol (SYMBICORT) 160-4.5 MCG/ACT inhaler Inhale 2 puffs into the lungs 2 (two) times daily. 2 puffs daily        . calcium-vitamin D (OSCAL WITH D) 500-200 MG-UNIT per tablet Take 1 tablet by mouth 2 (two) times daily.       . Ferrous Sulfate (IRON) 325 (65 FE) MG TABS Take by mouth 2 (two) times daily.      . furosemide (LASIX) 20 MG tablet 20 mg as needed.      Marland Kitchen guaiFENesin (MUCINEX) 600 MG 12 hr tablet Take 600 mg by mouth 2 (two) times daily as needed.       Marland Kitchen leuprolide, 6 Month, (ELIGARD) 45 MG injection Inject 45 mg into the skin every 6 (six) months.        . montelukast (SINGULAIR) 10 MG tablet Take 10 mg by mouth daily.      . Multiple Vitamins-Minerals (CENTRUM SILVER ULTRA MENS PO) Take 1 tablet by mouth daily.        . nitroGLYCERIN (NITROSTAT) 0.4 MG SL tablet Place 1 tablet (0.4 mg total) under the tongue every 5 (five) minutes as needed.  25 tablet  11  . NON FORMULARY 100 mg 2 (two) times daily. Stool softner       . NON FORMULARY Nyatatin swish and swallow.      Marland Kitchen omeprazole (PRILOSEC) 20 MG capsule Take 20 mg by mouth 2 (two) times daily.       . rosuvastatin (CRESTOR) 20 MG tablet Take 20 mg by mouth daily.      Marland Kitchen tiotropium (SPIRIVA) 18 MCG inhalation capsule Place 18 mcg into inhaler and inhale daily.         No current facility-administered medications on file prior to visit.    Allergies  Allergen Reactions  . Prednisone     Past Medical History  Diagnosis Date  . CAD (coronary artery  disease)   . Hypertension   . Atypical angina   . PVD (peripheral vascular disease)   . Prostate cancer   . Hyperlipidemia   . COPD (chronic obstructive pulmonary disease)   . TIA (transient ischemic attack) 07/2010  . Myocardial infarction 11/2010  . Anemia     Past Surgical History  Procedure Laterality Date  . Coronary artery bypass graft      6 years ago  . Carotid endarterectomy      bilateral  . Cardiac catheterization  2009, 2012    stents placed    History  Smoking status  . Former Smoker -- 1.50 packs/day for 40 years  . Quit date: 08/20/2007  Smokeless tobacco  . Never Used    History  Alcohol Use No    Family History  Problem Relation Age of Onset  . Heart disease Mother   . Stomach cancer Father   . Throat cancer Father   . Heart attack Brother     Reviw of Systems:  Reviewed in the HPI.  All other systems are negative.  Physical Exam: Blood pressure 92/60, pulse 106, height 6\' 1"  (1.854 m), weight 240 lb 4 oz (108.977 kg), SpO2 96.00%. General: Well developed, well nourished, in no acute  distress.  Head: Normocephalic, atraumatic, sclera non-icteric, mucus membranes are moist,   Neck: Supple. Carotids are 2 + without bruits. No JVD   Lungs: extensive wheezing, rhonchi  Heart: irreg. Irreg.  3/6 systolic murmur  Abdomen: Soft, non-tender, non-distended with normal bowel sounds.  Msk:  Strength and tone are normal   Extremities: No clubbing or cyanosis.trace edema  Distal pedal pulses are 2+ and equal    Neuro: CN II - XII intact.  Alert and oriented X 3.   Psych:  Normal   ECG: 12/22/12:  Atrial fib at 109.  Occasional PVCs.  No acute St change to suggest ischemia.  Assessment / Plan:

## 2012-12-22 NOTE — Assessment & Plan Note (Signed)
His BP was actually low today.

## 2012-12-22 NOTE — Assessment & Plan Note (Signed)
He has a history of coronary artery disease. He does not look like today's episodes of chest pain are due to acute coronary syndrome.

## 2012-12-22 NOTE — Telephone Encounter (Signed)
Dr. Mariah Milling called. sayes he got call from ER re:pt "Start Ranexa 500 mg PO BID x 1 week then increase to 1000 mg PO BID.  Have him call us before he completes 2nd pack to let us know if this is helping his anginal pain." V.O Dr. Alvis Lemmings, RN  Will place samples at [facility]

## 2012-12-22 NOTE — Assessment & Plan Note (Signed)
Mr. Javier [name] seems to be correlated to his premature ventricular contractions. He's been on beta blockers in the past but I suspect that they were discontinued because of his severe COPD. I would suggest that we try him on bisoprolol on metoprolol which maybe better tolerated than coreg.Marland Kitchen Hopefully this will help suppress his premature ventricular contractions and not exacerbate his COPD.  These PVCs seem to be the source of his pain today.

## 2012-12-22 NOTE — Assessment & Plan Note (Signed)
Javier [name] appears to be having a COPD exacerbation. He has chest tightness. He has episodes of severe chest pain I think correlate to his premature ventricular contractions. He does not have any acute changes on his EKG I don't think that this episode is due to an acute coronary syndrome.  He has multiple family members with him. They're insisting that we do something for him. I have  no choice but to send him to the emergency room for further evaluation.

## 2012-12-23 DIAGNOSIS — J9620 Acute and chronic respiratory failure, unspecified whether with hypoxia or hypercapnia: Secondary | ICD-10-CM

## 2012-12-23 DIAGNOSIS — I5033 Acute on chronic diastolic (congestive) heart failure: Secondary | ICD-10-CM

## 2012-12-23 DIAGNOSIS — I4891 Unspecified atrial fibrillation: Secondary | ICD-10-CM

## 2012-12-23 LAB — CBC WITH DIFFERENTIAL/PLATELET
Basophil #: 0.1 10*3/uL (ref 0.0–0.1)
Eosinophil #: 0 10*3/uL (ref 0.0–0.7)
Eosinophil %: 0.1 %
HCT: 29.1 % — ABNORMAL LOW (ref 40.0–52.0)
MCH: 28.8 pg (ref 26.0–34.0)
MCV: 88 fL (ref 80–100)
Monocyte #: 1.2 x10 3/mm — ABNORMAL HIGH (ref 0.2–1.0)
Monocyte %: 7.4 %
Neutrophil #: 12.7 10*3/uL — ABNORMAL HIGH (ref 1.4–6.5)
RBC: 3.31 10*6/uL — ABNORMAL LOW (ref 4.40–5.90)
RDW: 17.8 % — ABNORMAL HIGH (ref 11.5–14.5)
WBC: 16 10*3/uL — ABNORMAL HIGH (ref 3.8–10.6)

## 2012-12-23 LAB — BASIC METABOLIC PANEL
Anion Gap: 9 (ref 7–16)
Calcium, Total: 8.5 mg/dL (ref 8.5–10.1)
EGFR (Non-African Amer.): 51 — ABNORMAL LOW
Osmolality: 282 (ref 275–301)

## 2012-12-23 LAB — CK TOTAL AND CKMB (NOT AT ARMC): CK-MB: <0.5 ng/mL — ABNORMAL LOW (ref 0.5–3.6)

## 2012-12-23 LAB — TROPONIN I: Troponin-I: <0.02 ng/mL

## 2012-12-24 ENCOUNTER — Inpatient Hospital Stay: Payer: Self-pay | Admitting: Internal Medicine

## 2012-12-25 ENCOUNTER — Telehealth: Payer: Self-pay

## 2012-12-25 LAB — BASIC METABOLIC PANEL
Anion Gap: 7 (ref 7–16)
BUN: 18 mg/dL (ref 7–18)
Calcium, Total: 9 mg/dL (ref 8.5–10.1)
Chloride: 101 mmol/L (ref 98–107)
Co2: 28 mmol/L (ref 21–32)
Creatinine: 0.95 mg/dL (ref 0.60–1.30)
EGFR (African American): >60
EGFR (Non-African Amer.): >60
Glucose: 157 mg/dL — ABNORMAL HIGH (ref 65–99)
Osmolality: 277 (ref 275–301)
Sodium: 136 mmol/L (ref 136–145)

## 2012-12-25 LAB — CBC WITH DIFFERENTIAL/PLATELET
Basophil %: 0.3 %
Eosinophil #: 0 10*3/uL (ref 0.0–0.7)
Eosinophil %: 0 %
HGB: 9.4 g/dL — ABNORMAL LOW (ref 13.0–18.0)
Lymphocyte #: 0.4 10*3/uL — ABNORMAL LOW (ref 1.0–3.6)
Lymphocyte %: 4.6 %
MCV: 86 fL (ref 80–100)
Monocyte %: 2.4 %
Neutrophil #: 8.4 10*3/uL — ABNORMAL HIGH (ref 1.4–6.5)
RBC: 3.17 10*6/uL — ABNORMAL LOW (ref 4.40–5.90)

## 2012-12-25 NOTE — Telephone Encounter (Signed)
Message copied by Marcelle Overlie on Fri Dec 25, 2012 10:35 AM ------      Message from: Coralee Rud      Created: Fri Dec 25, 2012 10:32 AM      Regarding: tcm/ph       May 22 at 7:45 DR gollan ------

## 2012-12-25 NOTE — Telephone Encounter (Signed)
TCM D/c today 5/9 Will attempt TCM #1 5/12

## 2012-12-28 NOTE — Telephone Encounter (Signed)
tcm # 1 attempt I called [facility] to speak with nurse taking care of pt Was placed on hold for a prolonged pd. Of time Was told she is "passing meds" Will call back later

## 2012-12-29 NOTE — Telephone Encounter (Signed)
TCM attempt #2 LMTCB with co-worker

## 2012-12-30 ENCOUNTER — Telehealth: Payer: Self-pay

## 2012-12-30 NOTE — Telephone Encounter (Signed)
Returning you call from yesterday

## 2012-12-31 NOTE — Telephone Encounter (Signed)
This was for TCM f/u and is now out of eligible range

## 2013-01-01 ENCOUNTER — Telehealth: Payer: Self-pay

## 2013-01-01 NOTE — Telephone Encounter (Signed)
Kim with [facility] wanted [place] to know since hospital d/c pt is "doing the best he has ever been" Denying cp, worsening sob. Does have some pitting edema in LE and has had a 7 piund weight gain in 3 days.  MD on staff has increased his lasix to 40 mg qd x 3 days. He has completed neb tx and is feeling well Confirms knowledge of appt with [place] 5/22 at [facility]

## 2013-01-07 ENCOUNTER — Encounter: Payer: Self-pay | Admitting: Cardiovascular Disease

## 2013-01-07 ENCOUNTER — Ambulatory Visit (INDEPENDENT_AMBULATORY_CARE_PROVIDER_SITE_OTHER): Payer: Medicare Other | Admitting: Cardiovascular Disease

## 2013-01-07 VITALS — BP 100/60 | HR 84 | Ht 73.0 in | Wt 239.2 lb

## 2013-01-07 DIAGNOSIS — I25810 Atherosclerosis of coronary artery bypass graft(s) without angina pectoris: Secondary | ICD-10-CM

## 2013-01-07 DIAGNOSIS — D649 Anemia, unspecified: Secondary | ICD-10-CM

## 2013-01-07 DIAGNOSIS — I4891 Unspecified atrial fibrillation: Secondary | ICD-10-CM | POA: Insufficient documentation

## 2013-01-07 DIAGNOSIS — J449 Chronic obstructive pulmonary disease, unspecified: Secondary | ICD-10-CM

## 2013-01-07 DIAGNOSIS — E785 Hyperlipidemia, unspecified: Secondary | ICD-10-CM

## 2013-01-07 DIAGNOSIS — I10 Essential (primary) hypertension: Secondary | ICD-10-CM

## 2013-01-07 DIAGNOSIS — R609 Edema, unspecified: Secondary | ICD-10-CM

## 2013-01-07 NOTE — Assessment & Plan Note (Signed)
Continue Crestor. Goal LDL less than 70 

## 2013-01-07 NOTE — Assessment & Plan Note (Signed)
Blood pressure is well controlled on today's visit. No changes made to the medications. 

## 2013-01-07 NOTE — Progress Notes (Signed)
Patient ID: Javier [name], male    DOB: 03-Sep-1935, 77 y.o.   MRN: [id number]  HPI Comments: 77 year old male with history of smoking 40 years, severe asymmetric LVH with moderate LVOT gradient,  COPD, coronary artery disease with bypass surgery in 2007, occlusion of 2 vein grafts with PCI x4, peripheral vascular disease with bilateral carotid endarterectomies in 2002, history of TIA in 2002 who haspresented several times to [facility] with COPD exacerbation,  TIA, episode of COPD exacerbation/bronchitis also with episode of shortness of breath and chest pain that took him to [facility] where he had a cardiac catheterization showing patent vessels with no significant progression of his disease, who presents for routine followup . He was previously found to be resistant to Plavix by P2 Y. 12 assay  at [facility]   asymmetric LVH that is severe, diastolic relaxation abnormality, normal systolic function, mild MR significant chordal SAM with LVOT gradient of 3 m/s and gradient of 35 mm of mercury and increases to gradient of 49 with Valsalva (interestingly on followup echocardiogram at [facility] earlier in 2014, no significant gradient was recorded)   Frequent COPD exacerbations, often requiring hospitalization Often needing long courses of antibiotics and steroids   hospitalization in May 2013 chest pain, COPD exacerbation. Stress test showed no ischemia. Echocardiogram was essentially normal with mild MR. received significant IV fluids through his hospital course. Edema got worse through the next week with significant pain in his legs. This improved on outpatient diuretics. Lasix held periodically for low blood pressure Found to be very iron deficient, very anemic. Labs show iron level XVI, hematocrit 24 BNP 400  admission to the hospital 08/31/2012 for shortness of breath. Treated for COPD exacerbation, started on Levaquin IV steroids. CT scan of the chest showing stable pulmonary nodule.   On today's visit, he  is in rehabilitation at [facility]. Several recent hospital admissions for various issues including chest pain, shortness of breath, diastolic CHF, COPD exacerbation, dizziness and low blood pressure. Diltiazem dose was decreased down to 120 mg daily. For recurrent atrial fibrillation, amiodarone was used for rate control with diltiazem. Avoid beta blockers given bronchospasm. With renal dysfunction, avoiding digoxin. Lasix dosing was decreased to avoid hypotension and worsening renal failure. Recent chronic problems with anemia. Hematocrit 27 in the hospital at discharge  He presents today and reports that his weight is essentially the same though he does have slight worsening of his edema, now mildly pitting in the feet and lower extremities. Reports that his shortness of breath is stable. No significant cough. Overall feels well and is slowly getting stronger  Prior Carotid ultrasound done in the hospital 01/12/2012 shows no significant carotid disease  EKG shows atrial fibrillation with rate in the 80s   Outpatient Encounter Prescriptions as of 01/07/2013  Medication Sig Dispense Refill  . albuterol (VENTOLIN HFA) 108 (90 BASE) MCG/ACT inhaler Inhale 2 puffs into the lungs every 6 (six) hours as needed for wheezing.      Marland Kitchen ALPRAZolam (XANAX) 0.25 MG tablet Take 0.25 mg by mouth at bedtime as needed.      Marland Kitchen amiodarone (PACERONE) 200 MG tablet Take 200 mg by mouth 2 (two) times daily.       . budesonide-formoterol (SYMBICORT) 160-4.5 MCG/ACT inhaler Inhale 2 puffs into the lungs 2 (two) times daily. 2 puffs daily        . calcium-vitamin D (OSCAL WITH D) 500-200 MG-UNIT per tablet Take 1 tablet by mouth 2 (two) times daily.       Marland Kitchen  diltiazem (DILACOR XR) 120 MG 24 hr capsule Take 120 mg by mouth daily.      Marland Kitchen docusate sodium (COLACE) 100 MG capsule Take 100 mg by mouth 2 (two) times daily.      . Ferrous Sulfate (IRON) 325 (65 FE) MG TABS Take by mouth 2 (two) times daily.      . furosemide  (LASIX) 20 MG tablet 20 mg as needed.      Marland Kitchen HYDROcodone-acetaminophen (NORCO/VICODIN) 5-325 MG per tablet Take 1 tablet by mouth every 6 (six) hours as needed for pain.      Marland Kitchen Leuprolide Acetate (ELIGARD [place]) Inject into the skin daily.      Marland Kitchen leuprolide, 6 Month, (ELIGARD) 45 MG injection Inject 45 mg into the skin every 6 (six) months.      . levalbuterol (XOPENEX) 1.25 MG/3ML nebulizer solution Take 1.25 mg by nebulization every 4 (four) hours as needed for wheezing.      Marland Kitchen losartan (COZAAR) 25 MG tablet Take 25 mg by mouth daily.      . montelukast (SINGULAIR) 10 MG tablet Take 10 mg by mouth daily.      . MORPHINE SULFATE PO Take by mouth as needed.      . Multiple Vitamins-Minerals (CENTRUM SILVER ULTRA MENS PO) Take 1 tablet by mouth daily.        . nitroGLYCERIN (NITROSTAT) 0.4 MG SL tablet Place 1 tablet (0.4 mg total) under the tongue every 5 (five) minutes as needed.  25 tablet  11  . Omega-3 Fatty Acids (OMEGA-3 FISH OIL) 500 MG CAPS Take by mouth. Takes 2 tablets daily.      Marland Kitchen omeprazole (PRILOSEC) 20 MG capsule Take 20 mg by mouth 2 (two) times daily.       . rosuvastatin (CRESTOR) 20 MG tablet Take 20 mg by mouth daily.      Marland Kitchen senna (SENOKOT) 8.6 MG tablet Take 1 tablet by mouth daily.      . sertraline (ZOLOFT) 50 MG tablet Take 50 mg by mouth daily.      . tamsulosin (FLOMAX) 0.4 MG CAPS Take 0.4 mg by mouth daily.      Marland Kitchen tiotropium (SPIRIVA) 18 MCG inhalation capsule Place 18 mcg into inhaler and inhale daily.        . vitamin C (ASCORBIC ACID) 500 MG tablet Take 500 mg by mouth daily.       Review of Systems  HENT: Negative.   Eyes: Negative.   Cardiovascular: Positive for leg swelling.  Gastrointestinal: Negative.   Musculoskeletal: Negative.   Skin: Negative.   Psychiatric/Behavioral: Negative.   All other systems reviewed and are negative.    BP 100/60  Pulse 84  Ht 6\' 1"  (1.854 m)  Wt 239 lb 4 oz (108.523 kg)  BMI 31.57 kg/m2  Physical Exam  Nursing note  and vitals reviewed. Constitutional: He is oriented to person, place, and time. He appears well-developed and well-nourished.  HENT:  Head: Normocephalic.  Nose: Nose normal.  Mouth/Throat: Oropharynx is clear and moist.  Eyes: Conjunctivae are normal. Pupils are equal, round, and reactive to light.  Neck: Normal range of motion. Neck supple. No JVD present.  Cardiovascular: Normal rate, S1 normal, S2 normal and intact distal pulses.  An irregularly irregular rhythm present. Exam reveals no gallop and no friction rub.   Murmur heard.  Crescendo systolic murmur is present with a grade of 2/6  Trace pitting above the sock line bilaterally  Pulmonary/Chest: Effort normal. No  respiratory distress. He has decreased breath sounds. He has no wheezes. He exhibits no tenderness.  Abdominal: Soft. Bowel sounds are normal. He exhibits no distension. There is no tenderness.  Musculoskeletal: Normal range of motion. He exhibits no edema and no tenderness.  Lymphadenopathy:    He has no cervical adenopathy.  Neurological: He is alert and oriented to person, place, and time. Coordination normal.  Skin: Skin is warm and dry. No rash noted. No erythema.  Psychiatric: He has a normal mood and affect. His behavior is normal. Judgment and thought content normal.      Assessment and Plan

## 2013-01-07 NOTE — Assessment & Plan Note (Signed)
1+ pitting edema today consistent with diastolic CHF. Also likely some contribution from underlying anemia. Continue periodic Lasix for weight gain.

## 2013-01-07 NOTE — Assessment & Plan Note (Signed)
Recommended he closely followup with hematology. Goal hemoglobin greater than 10

## 2013-01-07 NOTE — Assessment & Plan Note (Signed)
Severe underlying lung disease. Frequent admissions to the hospital for COPD exacerbation.

## 2013-01-07 NOTE — Assessment & Plan Note (Signed)
Heart rate is well controlled on his current medication regimen. We'll continue on current doses of amiodarone. Lower doses have been an adequate for rate control. Unable to advance diltiazem secondary to hypotension. Avoiding digoxin given renal dysfunction.

## 2013-01-07 NOTE — Patient Instructions (Addendum)
You are doing well. Watch the weigh and take lasix for any weight gain.  Take potassium 10 meq with lasix Periodic lab work / BMP as needed  Lasix for any worsening shortness of breath  Please call us if you have new issues that need to be addressed before your next appt.  Your physician wants you to follow-up in: 2 months.  You will receive a reminder letter in the mail two months in advance. If you don't receive a letter, please call our office to schedule the follow-up appointment.

## 2013-01-07 NOTE — Assessment & Plan Note (Signed)
Currently with no symptoms of angina. No further workup at this time. Continue current medication regimen. 

## 2013-01-11 ENCOUNTER — Other Ambulatory Visit: Payer: Self-pay | Admitting: Family Medicine

## 2013-01-11 LAB — COMPREHENSIVE METABOLIC PANEL
Albumin: 3.4 g/dL (ref 3.4–5.0)
Alkaline Phosphatase: 57 U/L (ref 50–136)
Anion Gap: 10 (ref 7–16)
Calcium, Total: 8.9 mg/dL (ref 8.5–10.1)
Chloride: 94 mmol/L — ABNORMAL LOW (ref 98–107)
Co2: 32 mmol/L (ref 21–32)
EGFR (African American): 56 — ABNORMAL LOW
Potassium: 3.1 mmol/L — ABNORMAL LOW (ref 3.5–5.1)
SGOT(AST): 23 U/L (ref 15–37)
Total Protein: 7.3 g/dL (ref 6.4–8.2)

## 2013-01-11 LAB — SEDIMENTATION RATE: Erythrocyte Sed Rate: >140 mm/hr — ABNORMAL HIGH (ref 0–20)

## 2013-01-17 ENCOUNTER — Ambulatory Visit: Payer: Self-pay | Admitting: Internal Medicine

## 2013-01-24 DIAGNOSIS — D649 Anemia, unspecified: Secondary | ICD-10-CM

## 2013-01-24 DIAGNOSIS — J449 Chronic obstructive pulmonary disease, unspecified: Secondary | ICD-10-CM

## 2013-01-24 DIAGNOSIS — R609 Edema, unspecified: Secondary | ICD-10-CM

## 2013-01-24 DIAGNOSIS — I4891 Unspecified atrial fibrillation: Secondary | ICD-10-CM

## 2013-01-24 DIAGNOSIS — I10 Essential (primary) hypertension: Secondary | ICD-10-CM

## 2013-01-24 DIAGNOSIS — E785 Hyperlipidemia, unspecified: Secondary | ICD-10-CM

## 2013-02-05 LAB — COMPREHENSIVE METABOLIC PANEL
Alkaline Phosphatase: 46 U/L — ABNORMAL LOW (ref 50–136)
Anion Gap: 8 (ref 7–16)
BUN: 25 mg/dL — ABNORMAL HIGH (ref 7–18)
Chloride: 104 mmol/L (ref 98–107)
Creatinine: 1.4 mg/dL — ABNORMAL HIGH (ref 0.60–1.30)
EGFR (African American): 57 — ABNORMAL LOW
EGFR (Non-African Amer.): 49 — ABNORMAL LOW
Osmolality: 274 (ref 275–301)
Potassium: 4.5 mmol/L (ref 3.5–5.1)
SGOT(AST): 27 U/L (ref 15–37)
SGPT (ALT): 29 U/L (ref 12–78)
Sodium: 135 mmol/L — ABNORMAL LOW (ref 136–145)

## 2013-02-05 LAB — URINALYSIS, COMPLETE
Bacteria: NONE SEEN
Bilirubin,UR: NEGATIVE
Glucose,UR: NEGATIVE mg/dL (ref 0–75)
Hyaline Cast: 3
Ketone: NEGATIVE
Nitrite: NEGATIVE
RBC,UR: 2 /HPF (ref 0–5)

## 2013-02-05 LAB — CBC
HCT: 37 % — ABNORMAL LOW (ref 40.0–52.0)
MCH: 28.5 pg (ref 26.0–34.0)
MCHC: 32.6 g/dL (ref 32.0–36.0)
RBC: 4.24 10*6/uL — ABNORMAL LOW (ref 4.40–5.90)
RDW: 17.6 % — ABNORMAL HIGH (ref 11.5–14.5)

## 2013-02-06 ENCOUNTER — Observation Stay: Payer: Self-pay | Admitting: Specialist

## 2013-02-07 LAB — BASIC METABOLIC PANEL
Anion Gap: 8 (ref 7–16)
Chloride: 106 mmol/L (ref 98–107)
Creatinine: 1.18 mg/dL (ref 0.60–1.30)
EGFR (Non-African Amer.): >60
Glucose: 88 mg/dL (ref 65–99)
Osmolality: 279 (ref 275–301)
Potassium: 3.9 mmol/L (ref 3.5–5.1)

## 2013-02-08 ENCOUNTER — Telehealth: Payer: Self-pay

## 2013-02-08 NOTE — Telephone Encounter (Signed)
I discussed with Dr. Mariah Milling who says "ok to put on schedule next available appt but in meantime, we should get labs from [facility] and remind pt to only take lasix PRN weight gain of greater than/equal to 3 pounds overnight or 4 pounds in 1 week and to make sure he is staying well hydrated" V.O.Dr. Alvis Lemmings, RN, BSN

## 2013-02-08 NOTE — Telephone Encounter (Signed)
Wife says pt was in [facility] over w/e and was told to f/u with Dr. Mariah Milling "ASAP" Says he was admitted with dx:low BP and needs Dr. Mariah Milling to adjust meds Says BP reached 70 systolic prior to admission  Pt feeling well today Says HR fluctuates Before making appt with Dr. Mariah Milling I will discuss with him and call wife back Understanding verb

## 2013-02-08 NOTE — Telephone Encounter (Signed)
Pt informed Scheduled for 7/2 with Dr. Mariah Milling Understanding verb

## 2013-02-08 NOTE — Telephone Encounter (Signed)
Pt states he was in the hospital this weekend states his BP was "going from high to low" states he needs to see dr Mariah Milling ASAP. Please call

## 2013-02-08 NOTE — Telephone Encounter (Signed)
lmtcb

## 2013-02-17 ENCOUNTER — Encounter: Payer: Self-pay | Admitting: Cardiovascular Disease

## 2013-02-17 ENCOUNTER — Ambulatory Visit (INDEPENDENT_AMBULATORY_CARE_PROVIDER_SITE_OTHER): Payer: Medicare Other | Admitting: Cardiovascular Disease

## 2013-02-17 VITALS — BP 122/82 | HR 79 | Ht 73.0 in | Wt 234.5 lb

## 2013-02-17 DIAGNOSIS — R609 Edema, unspecified: Secondary | ICD-10-CM

## 2013-02-17 DIAGNOSIS — I10 Essential (primary) hypertension: Secondary | ICD-10-CM

## 2013-02-17 DIAGNOSIS — I4891 Unspecified atrial fibrillation: Secondary | ICD-10-CM

## 2013-02-17 DIAGNOSIS — I25810 Atherosclerosis of coronary artery bypass graft(s) without angina pectoris: Secondary | ICD-10-CM

## 2013-02-17 DIAGNOSIS — E785 Hyperlipidemia, unspecified: Secondary | ICD-10-CM

## 2013-02-17 NOTE — Progress Notes (Signed)
Patient ID: Javier [name], male    DOB: 03/21/1936, 77 y.o.   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 He was previously found to be resistant to Plavix by P2 Y. 12 assay  at [facility]   asymmetric LVH that is severe, diastolic relaxation abnormality, normal systolic function, mild MR significant chordal SAM with LVOT gradient of 3 m/s and gradient of 35 mm of mercury and increases to gradient of 49 with Valsalva (interestingly on followup echocardiogram at [facility] earlier in 2014, no significant gradient was recorded)   Frequent COPD exacerbations, often requiring hospitalization Often needing long courses of antibiotics and steroids   hospitalization in May 2013 chest pain, COPD exacerbation. Stress test showed no ischemia. Echocardiogram was essentially normal with mild MR. received significant IV fluids through his hospital course. Edema got worse through the next week with significant pain in his legs. This improved on outpatient diuretics. Lasix held periodically for low blood pressure Found to be very iron deficient, very anemic. Labs show iron level XVI, hematocrit 24 BNP 400  admission to the hospital 08/31/2012 for shortness of breath. Treated for COPD exacerbation, started on Levaquin IV steroids. CT scan of the chest showing stable pulmonary nodule.   On today's visit, he  is in rehabilitation at [facility]. Several recent hospital admissions for various issues including chest pain, shortness of breath, diastolic CHF, COPD exacerbation, dizziness and low blood pressure. Diltiazem dose was decreased down to 120 mg daily. For recurrent atrial fibrillation, amiodarone was used for rate control with diltiazem. Avoid beta blockers given bronchospasm. With renal dysfunction, avoiding digoxin. Lasix dosing was decreased to avoid hypotension and worsening renal failure. Recent chronic problems with anemia. Hematocrit 27 in the hospital at discharge  Recent hospitalization 02/06/2013 overnight for orthostatic hypotension but improved with IV fluids, acute renal failure that improved with fluids, chronic atrial fibrillation. Since he's been home, he reports having periods of continued orthostasis. Sometimes blood pressure down to the 90s. Otherwise he feels well, no edema. He takes Lasix only when necessary. Is not on warfarin secondary to recent GI bleeds, as well as hematuria.  Prior Carotid ultrasound done in the hospital 01/12/2012 shows no significant carotid disease  EKG shows atrial fibrillation with rate of 79 beats per minute   Outpatient Encounter Prescriptions as of 02/17/2013  Medication Sig Dispense Refill  . albuterol (VENTOLIN HFA) 108 (90 BASE) MCG/ACT inhaler Inhale 2 puffs into the lungs every 6 (six) hours as needed for wheezing.      Marland Kitchen ALPRAZolam (XANAX) 0.25 MG tablet Take 0.25 mg by mouth at bedtime as needed.      Marland Kitchen amiodarone (PACERONE) 200 MG tablet Take 200 mg by mouth 2 (two) times daily.       . budesonide-formoterol (SYMBICORT) 160-4.5 MCG/ACT inhaler Inhale 2 puffs into the lungs 2 (two) times daily. 2 puffs daily        . calcium-vitamin D (  OSCAL WITH D) 500-200 MG-UNIT per tablet Take 1 tablet by mouth 2 (two) times daily.       Marland Kitchen diltiazem (DILACOR XR) 120 MG 24 hr capsule Take 120 mg by mouth daily.      Marland Kitchen docusate sodium (COLACE) 100 MG  capsule Take 100 mg by mouth 2 (two) times daily.      . Ferrous Sulfate (IRON) 325 (65 FE) MG TABS Take by mouth 2 (two) times daily.      . furosemide (LASIX) 20 MG tablet 20 mg as needed.      Marland Kitchen HYDROcodone-acetaminophen (NORCO/VICODIN) 5-325 MG per tablet Take 1 tablet by mouth every 6 (six) hours as needed for pain.      Marland Kitchen ipratropium-albuterol (DUONEB) 0.5-2.5 (3) MG/3ML SOLN Take 3 mLs by nebulization as needed.      Marland Kitchen leuprolide, 6 Month, (ELIGARD) 45 MG injection Inject 45 mg into the skin every 6 (six) months.      . levalbuterol (XOPENEX) 1.25 MG/3ML nebulizer solution Take 1.25 mg by nebulization every 4 (four) hours as needed for wheezing.      Marland Kitchen losartan (COZAAR) 25 MG tablet Take 25 mg by mouth daily.      . montelukast (SINGULAIR) 10 MG tablet Take 10 mg by mouth daily.      . MORPHINE SULFATE PO Take 20 mg by mouth as needed.       . Multiple Vitamins-Minerals (CENTRUM SILVER ULTRA MENS PO) Take 1 tablet by mouth daily.        . nitroGLYCERIN (NITROSTAT) 0.4 MG SL tablet Place 1 tablet (0.4 mg total) under the tongue every 5 (five) minutes as needed.  25 tablet  11  . Omega-3 Fatty Acids (OMEGA-3 FISH OIL) 500 MG CAPS Take by mouth. Takes 2 tablets daily.      Marland Kitchen omeprazole (PRILOSEC) 40 MG capsule Take 40 mg by mouth 2 (two) times daily.      . potassium chloride (K-DUR,KLOR-CON) 10 MEQ tablet Take 10 mEq by mouth as needed.      . rosuvastatin (CRESTOR) 20 MG tablet Take 20 mg by mouth daily.      Marland Kitchen senna (SENOKOT) 8.6 MG tablet Take 1 tablet by mouth daily.      . sertraline (ZOLOFT) 50 MG tablet Take 50 mg by mouth daily.      Marland Kitchen spironolactone (ALDACTONE) 25 MG tablet Take 25 mg by mouth daily.      Marland Kitchen tiotropium (SPIRIVA) 18 MCG inhalation capsule Place 18 mcg into inhaler and inhale daily.        . vitamin C (ASCORBIC ACID) 500 MG tablet Take 500 mg by mouth daily.      . [DISCONTINUED] Leuprolide Acetate (ELIGARD Crivitz) Inject into the skin daily.      . [DISCONTINUED]  omeprazole (PRILOSEC) 20 MG capsule Take 20 mg by mouth 2 (two) times daily.       . [DISCONTINUED] tamsulosin (FLOMAX) 0.4 MG CAPS Take 0.4 mg by mouth daily.        Review of Systems  HENT: Negative.   Eyes: Negative.   Gastrointestinal: Negative.   Musculoskeletal: Negative.   Skin: Negative.   Psychiatric/Behavioral: Negative.   All other systems reviewed and are negative.    BP 122/82  Pulse 79  Ht 6\' 1"  (1.854 m)  Wt 234 lb 8 oz (106.369 kg)  BMI 30.95 kg/m2  Physical Exam  Nursing note and vitals reviewed. Constitutional: He is oriented to person, place, and time.  He appears well-developed and well-nourished.  HENT:  Head: Normocephalic.  Nose: Nose normal.  Mouth/Throat: Oropharynx is clear and moist.  Eyes: Conjunctivae are normal. Pupils are equal, round, and reactive to light.  Neck: Normal range of motion. Neck supple. No JVD present.  Cardiovascular: Normal rate, S1 normal, S2 normal and intact distal pulses.  An irregularly irregular rhythm present. Exam reveals no gallop and no friction rub.   Murmur heard.  Crescendo systolic murmur is present with a grade of 2/6  Trace pitting above the sock line bilaterally  Pulmonary/Chest: Effort normal. No respiratory distress. He has decreased breath sounds. He has no wheezes. He exhibits no tenderness.  Abdominal: Soft. Bowel sounds are normal. He exhibits no distension. There is no tenderness.  Musculoskeletal: Normal range of motion. He exhibits no edema and no tenderness.  Lymphadenopathy:    He has no cervical adenopathy.  Neurological: He is alert and oriented to person, place, and time. Coordination normal.  Skin: Skin is warm and dry. No rash noted. No erythema.  Psychiatric: He has a normal mood and affect. His behavior is normal. Judgment and thought content normal.      Assessment and Plan

## 2013-02-17 NOTE — Assessment & Plan Note (Signed)
We have suggested he stay on his Crestor

## 2013-02-17 NOTE — Assessment & Plan Note (Signed)
No significant edema on today's visit. Encouraged him to closely watch his weight, take Lasix for any ankle edema

## 2013-02-17 NOTE — Patient Instructions (Addendum)
You are doing well.  Hold the losartan, for low blood pressures Take lasix for 4 to 5 pound weight gain or ankle edema  Please call us if you have new issues that need to be addressed before your next appt.  Your physician wants you to follow-up in: 3 months.  You will receive a reminder letter in the mail two months in advance. If you don't receive a letter, please call our office to schedule the follow-up appointment.

## 2013-02-17 NOTE — Assessment & Plan Note (Addendum)
He reports continued orthostasis at home. Nurses are measuring his blood pressures. We will hold his losartan. Encouraged he increase his fluid intake for low blood pressures.

## 2013-02-17 NOTE — Assessment & Plan Note (Signed)
Currently with no symptoms of angina. No further workup at this time. Continue current medication regimen. 

## 2013-02-17 NOTE — Assessment & Plan Note (Signed)
Heart rate well controlled on his current medications. Not a good candidate for anticoagulation given GI bleed. In followup, we will likely reduce his amiodarone and wean off if he can maintain atrial fibrillation. For cardioversion, he would need at least one month of anticoagulation prior to cardioversion and one month following Cardioversion on blood thinners. He would be high risk of recurrent GI bleeds. We've asked him to talk about this with his GI physicians. He is relatively asymptomatic at this time. Family is aware of risk and benefits of not being on anticoagulation. He is comfortable with this for now.

## 2013-02-24 ENCOUNTER — Encounter: Payer: Self-pay | Admitting: *Deleted

## 2013-03-11 ENCOUNTER — Ambulatory Visit: Payer: Medicare Other | Admitting: Cardiovascular Disease

## 2013-03-17 ENCOUNTER — Observation Stay: Payer: Self-pay | Admitting: Internal Medicine

## 2013-03-17 DIAGNOSIS — I4891 Unspecified atrial fibrillation: Secondary | ICD-10-CM

## 2013-03-17 DIAGNOSIS — I5032 Chronic diastolic (congestive) heart failure: Secondary | ICD-10-CM

## 2013-03-17 DIAGNOSIS — R55 Syncope and collapse: Secondary | ICD-10-CM

## 2013-03-17 LAB — CK TOTAL AND CKMB (NOT AT ARMC): CK-MB: <0.5 ng/mL — ABNORMAL LOW (ref 0.5–3.6)

## 2013-03-17 LAB — URINALYSIS, COMPLETE
Bilirubin,UR: NEGATIVE
Hyaline Cast: 8
Ketone: NEGATIVE
Nitrite: NEGATIVE
Ph: 5 (ref 4.5–8.0)
RBC,UR: 4 /HPF (ref 0–5)
Specific Gravity: 1.024 (ref 1.003–1.030)
Squamous Epithelial: <1
WBC UR: 3 /HPF (ref 0–5)

## 2013-03-17 LAB — COMPREHENSIVE METABOLIC PANEL
Albumin: 3.3 g/dL — ABNORMAL LOW (ref 3.4–5.0)
Anion Gap: 8 (ref 7–16)
Bilirubin,Total: 0.2 mg/dL (ref 0.2–1.0)
Chloride: 103 mmol/L (ref 98–107)
Co2: 24 mmol/L (ref 21–32)
EGFR (Non-African Amer.): 43 — ABNORMAL LOW
Glucose: 113 mg/dL — ABNORMAL HIGH (ref 65–99)
Osmolality: 277 (ref 275–301)
Potassium: 4 mmol/L (ref 3.5–5.1)
SGOT(AST): 29 U/L (ref 15–37)
SGPT (ALT): 39 U/L (ref 12–78)
Sodium: 135 mmol/L — ABNORMAL LOW (ref 136–145)
Total Protein: 7.6 g/dL (ref 6.4–8.2)

## 2013-03-17 LAB — CBC
HCT: 37.8 % — ABNORMAL LOW (ref 40.0–52.0)
MCH: 30 pg (ref 26.0–34.0)
MCV: 87 fL (ref 80–100)
RBC: 4.34 10*6/uL — ABNORMAL LOW (ref 4.40–5.90)
RDW: 15.1 % — ABNORMAL HIGH (ref 11.5–14.5)

## 2013-03-17 LAB — TROPONIN I
Troponin-I: <0.02 ng/mL
Troponin-I: <0.02 ng/mL

## 2013-03-18 DIAGNOSIS — I4891 Unspecified atrial fibrillation: Secondary | ICD-10-CM

## 2013-03-18 DIAGNOSIS — M6281 Muscle weakness (generalized): Secondary | ICD-10-CM

## 2013-03-18 LAB — CBC WITH DIFFERENTIAL/PLATELET
Eosinophil #: 0.2 10*3/uL (ref 0.0–0.7)
Eosinophil %: 2.1 %
HGB: 12.6 g/dL — ABNORMAL LOW (ref 13.0–18.0)
MCH: 29.5 pg (ref 26.0–34.0)
MCV: 87 fL (ref 80–100)
Monocyte %: 6.3 %
Neutrophil %: 76.9 %
Platelet: 171 10*3/uL (ref 150–440)
RBC: 4.28 10*6/uL — ABNORMAL LOW (ref 4.40–5.90)
RDW: 14.9 % — ABNORMAL HIGH (ref 11.5–14.5)
WBC: 10.9 10*3/uL — ABNORMAL HIGH (ref 3.8–10.6)

## 2013-03-18 LAB — COMPREHENSIVE METABOLIC PANEL
Albumin: 3.2 g/dL — ABNORMAL LOW (ref 3.4–5.0)
Alkaline Phosphatase: 43 U/L — ABNORMAL LOW (ref 50–136)
Anion Gap: 5 — ABNORMAL LOW (ref 7–16)
Calcium, Total: 9.1 mg/dL (ref 8.5–10.1)
Chloride: 103 mmol/L (ref 98–107)
Co2: 27 mmol/L (ref 21–32)
EGFR (Non-African Amer.): 52 — ABNORMAL LOW
Glucose: 97 mg/dL (ref 65–99)
Potassium: 4.3 mmol/L (ref 3.5–5.1)
SGPT (ALT): 33 U/L (ref 12–78)
Sodium: 135 mmol/L — ABNORMAL LOW (ref 136–145)
Total Protein: 7.6 g/dL (ref 6.4–8.2)

## 2013-03-18 LAB — MAGNESIUM: Magnesium: 1.6 mg/dL — ABNORMAL LOW

## 2013-03-18 LAB — PROTIME-INR: Prothrombin Time: 13.3 secs (ref 11.5–14.7)

## 2013-03-19 ENCOUNTER — Telehealth: Payer: Self-pay

## 2013-03-19 NOTE — Telephone Encounter (Signed)
LMTCB TCM call #1.

## 2013-03-19 NOTE — Telephone Encounter (Signed)
Message copied by Migdalia Dk on Fri Mar 19, 2013  8:53 AM ------      Message from: Christell Faith      Created: Thu Mar 18, 2013  2:58 PM      Regarding: tcm/ph       Dr. Mariah Milling 04/07/13 10:45 ------

## 2013-03-19 NOTE — Telephone Encounter (Signed)
Patient contacted regarding discharge from [facility] on 03/18/13.  Spoke with pt's wife.  Patient understands to follow up with provider Dr Mariah Milling on 04/07/13 at 10:45am at [facility]. Patient understands discharge instructions? yes Patient understands medications and regiment? yes Patient understands to bring all medications to this visit? yes

## 2013-03-23 ENCOUNTER — Ambulatory Visit: Payer: Self-pay | Admitting: Internal Medicine

## 2013-03-23 LAB — PLATELET FUNCTION ASSAY: COL/EPI PLT FXN SCRN: 158 Seconds — ABNORMAL HIGH

## 2013-03-23 LAB — CANCER CENTER HEMOGLOBIN: HGB: 13.9 g/dL (ref 13.0–18.0)

## 2013-03-24 ENCOUNTER — Encounter: Payer: Self-pay | Admitting: Cardiovascular Disease

## 2013-03-24 ENCOUNTER — Ambulatory Visit (INDEPENDENT_AMBULATORY_CARE_PROVIDER_SITE_OTHER): Payer: Medicare Other | Admitting: Cardiovascular Disease

## 2013-03-24 VITALS — BP 100/60 | HR 94 | Ht 73.0 in | Wt 232.2 lb

## 2013-03-24 DIAGNOSIS — I951 Orthostatic hypotension: Secondary | ICD-10-CM

## 2013-03-24 DIAGNOSIS — I25810 Atherosclerosis of coronary artery bypass graft(s) without angina pectoris: Secondary | ICD-10-CM

## 2013-03-24 DIAGNOSIS — R0602 Shortness of breath: Secondary | ICD-10-CM

## 2013-03-24 DIAGNOSIS — I4891 Unspecified atrial fibrillation: Secondary | ICD-10-CM

## 2013-03-24 DIAGNOSIS — I5032 Chronic diastolic (congestive) heart failure: Secondary | ICD-10-CM

## 2013-03-24 MED ORDER — MIDODRINE HCL 5 MG PO TABS: 5.0000 mg | ORAL_TABLET | Freq: Three times a day (TID) | ORAL | Status: DC

## 2013-03-24 MED ORDER — FLUDROCORTISONE ACETATE 0.1 MG PO TABS: 0.1000 mg | ORAL_TABLET | Freq: Every day | ORAL | Status: DC

## 2013-03-24 NOTE — Assessment & Plan Note (Signed)
Currently with no symptoms of angina. No further workup at this time. Continue current medication regimen. 

## 2013-03-24 NOTE — Progress Notes (Signed)
Patient ID: Javier [name], male    DOB: May 16, 1936, 77 y.o.   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 He was previously found to be resistant to Plavix by P2 Y. 12 assay  at [facility]   asymmetric LVH that is severe, diastolic relaxation abnormality, normal systolic function, mild MR significant chordal SAM with LVOT gradient of 3 m/s and gradient of 35 mm of mercury and increases to gradient of 49 with Valsalva (interestingly on followup echocardiogram at [facility] earlier in 2014, no significant gradient was recorded)   Frequent COPD exacerbations, often requiring hospitalization Often needing long courses of antibiotics and steroids   hospitalization in May 2013 chest pain, COPD exacerbation. Stress test showed no ischemia. Echocardiogram was essentially normal with mild MR. received significant IV fluids through his hospital course. Edema got worse through the next week with significant pain in his legs. This improved on outpatient diuretics. Lasix held periodically for low blood pressure Found to be very iron deficient, very anemic. Labs show iron level XVI, hematocrit 24 BNP 400  admission to the hospital 08/31/2012 for shortness of breath. Treated for COPD exacerbation, started on Levaquin IV steroids. CT scan of the chest showing stable pulmonary nodule.   Several recent  hospital admissions for orthostatic hypotension, syncope. Previous admission 02/06/2013 Last hospitalized end of July with discharge July 31 with syncope, orthostatic hypotension. Creatinine was 1.54 with BUN 30 on arrival Lasix has been held. He presents today and reports having continued inappropriate urination in the afternoon, dizziness with standing, blood pressure at home 102 systolic. Legs are weak. Denies any lower extremity edema or shortness of breath He was recently seen by hematology, Dr. Neale Burly, suggested that he have additional testing for platelet aggregation. This is pending   not on warfarin secondary to recent GI bleeds, as well as hematuria.  Prior Carotid ultrasound done in the hospital 01/12/2012 shows no significant carotid disease  EKG shows atrial fibrillation with rate of 94 beats per minute   Outpatient Encounter Prescriptions as of 02/17/2013  Medication Sig Dispense Refill  . albuterol (VENTOLIN HFA) 108 (90 BASE) MCG/ACT inhaler Inhale 2 puffs into the lungs every 6 (six) hours as needed for wheezing.      Marland Kitchen ALPRAZolam (XANAX) 0.25 MG tablet Take 0.25 mg by mouth at bedtime as needed.      Marland Kitchen amiodarone (PACERONE) 200 MG tablet Take 200 mg by mouth 2 (two) times daily.       . budesonide-formoterol (SYMBICORT) 160-4.5 MCG/ACT inhaler Inhale 2 puffs into the lungs 2 (two) times daily. 2 puffs daily        . calcium-vitamin D (OSCAL WITH D) 500-200 MG-UNIT per tablet Take 1 tablet by mouth 2 (two) times daily.       Marland Kitchen diltiazem (DILACOR XR) 120 MG 24 hr capsule Take 120 mg by mouth daily.      Marland Kitchen docusate sodium (COLACE) 100 MG capsule  Take 100 mg by mouth 2 (two) times daily.      . Ferrous Sulfate (IRON) 325 (65 FE) MG TABS Take by mouth 2 (two) times daily.      . furosemide (LASIX) 20 MG tablet 20 mg as needed.currently not taking       . HYDROcodone-acetaminophen (NORCO/VICODIN) 5-325 MG per tablet Take 1 tablet by mouth every 6 (six) hours as needed for pain.       Marland Kitchen ipratropium-albuterol (DUONEB) 0.5-2.5 (3) MG/3ML SOLN Take 3 mLs by nebulization as needed.      Marland Kitchen leuprolide, 6 Month, (ELIGARD) 45 MG injection Inject 45 mg into the skin every 6 (six) months.      . levalbuterol (XOPENEX) 1.25 MG/3ML nebulizer solution Take 1.25 mg by nebulization every 4 (four) hours as needed for wheezing.      Marland Kitchen losartan (COZAAR) 25 MG tablet Take 25 mg by mouth daily.      . montelukast (SINGULAIR) 10 MG tablet Take 10 mg by mouth daily.      . MORPHINE SULFATE PO Take 20 mg by mouth as needed.       . Multiple Vitamins-Minerals (CENTRUM SILVER ULTRA MENS PO) Take 1 tablet by mouth daily.        . nitroGLYCERIN (NITROSTAT) 0.4 MG SL tablet Place 1 tablet (0.4 mg total) under the tongue every 5 (five) minutes as needed.  25 tablet  11  . Omega-3 Fatty Acids (OMEGA-3 FISH OIL) 500 MG CAPS Take by mouth. Takes 2 tablets daily.      Marland Kitchen omeprazole (PRILOSEC) 40 MG capsule Take 40 mg by mouth 2 (two) times daily.      . potassium chloride (K-DUR,KLOR-CON) 10 MEQ tablet Take 10 mEq by mouth as needed.      . rosuvastatin (CRESTOR) 20 MG tablet Take 20 mg by mouth daily.      Marland Kitchen senna (SENOKOT) 8.6 MG tablet Take 1 tablet by mouth daily.      . sertraline (ZOLOFT) 50 MG tablet Take 50 mg by mouth daily.      Marland Kitchen spironolactone (ALDACTONE) 25 MG tablet Take 25 mg by mouth daily.      Marland Kitchen tiotropium (SPIRIVA) 18 MCG inhalation capsule Place 18 mcg into inhaler and inhale daily.        . vitamin C (ASCORBIC ACID) 500 MG tablet Take 500 mg by mouth daily.        Review of Systems  Constitutional: Negative.   HENT: Negative.   Eyes: Negative.   Respiratory: Negative.   Cardiovascular: Negative.   Gastrointestinal: Negative.   Musculoskeletal: Negative.   Skin: Negative.   Neurological: Negative.   Psychiatric/Behavioral: Negative.   All other systems reviewed and are negative.    BP 100/60  Pulse 94  Ht 6\' 1"  (1.854 m)  Wt 232 lb 4 oz (105.348 kg)  BMI 30.65  kg/m2  Physical Exam  Nursing note and vitals reviewed. Constitutional: He is oriented to person, place, and time. He appears well-developed and well-nourished.  HENT:  Head: Normocephalic.  Nose: Nose normal.  Mouth/Throat: Oropharynx is clear and moist.  Eyes: Conjunctivae are normal. Pupils are equal, round, and reactive to light.  Neck: Normal range of motion. Neck supple. No JVD present.  Cardiovascular: Normal rate, S1 normal, S2 normal and intact distal pulses.  An irregularly irregular rhythm present. Exam reveals no gallop and no friction rub.   Murmur heard.  Crescendo systolic murmur is present with a grade of 2/6  Trace pitting above the sock line bilaterally  Pulmonary/Chest: Effort normal. No respiratory distress. He has decreased breath sounds. He has no wheezes. He exhibits no tenderness.  Abdominal: Soft. Bowel sounds are normal. He exhibits no distension. There is no tenderness.  Musculoskeletal: Normal range of motion. He exhibits no edema and no tenderness.  Lymphadenopathy:    He has no cervical adenopathy.  Neurological: He is alert and oriented to person, place, and time. Coordination normal.  Skin: Skin is warm and dry. No rash noted. No erythema.  Psychiatric: He has a normal mood and affect. His behavior is normal. Judgment and thought content normal.      Assessment and Plan

## 2013-03-24 NOTE — Assessment & Plan Note (Signed)
Several recent admissions to the hospital in June and July for syncope and dizziness.. Dehydrated on arrival. He does report inappropriate urination. I've asked him to track the timing of this. Desmopressin may be needed. For now we will start Florinef 0.1 mg daily with midodrine 5 mg 3 times a day. Close followup with monitoring of his blood pressures at home.

## 2013-03-24 NOTE — Patient Instructions (Addendum)
Please take midodrine 5 mg three times a day Start florinef daily   If you get leg edema, weight gain of 5 pounds or more, take the florinef only three times a week  If no relief, Take a lasix with potassium  Please call us if you have new issues that need to be addressed before your next appt.  Your physician wants you to follow-up in: 2 weeks, already has an appt 04/07/13

## 2013-03-24 NOTE — Assessment & Plan Note (Signed)
CHF has been an issue in the past, not recently. In fact blood pressure is low, appearing dehydrated. He we'll hold his Lasix unless he has ankle edema.

## 2013-03-24 NOTE — Assessment & Plan Note (Signed)
Rate is adequately controlled. Not a good candidate for anticoagulation given hematuria, nosebleeds.

## 2013-03-29 ENCOUNTER — Telehealth: Payer: Self-pay | Admitting: *Deleted

## 2013-03-29 NOTE — Telephone Encounter (Signed)
Javier [name] with [facility] callle and she needs an authorization for home health care for a new certification period.

## 2013-03-29 NOTE — Telephone Encounter (Signed)
LM for Lynn to call back.

## 2013-03-30 ENCOUNTER — Telehealth: Payer: Self-pay

## 2013-03-30 NOTE — Telephone Encounter (Signed)
PT states patient is very shaky, BP is 170/88 lying down, 140/82 sitting up. Pt has not taken his meds when she got there. Please call

## 2013-03-30 NOTE — Telephone Encounter (Signed)
Returned call to [place], [facility] PT states she arrived at house pt was very shakey and pale.  Pt had not eaten, wife fed him a bowl of cereal and he seem to feel some better.  BP orthostatic 170/88 lying down and 140/82 sitting up.  Per Alinda Money pt was here last week on 03/24/13 in office orthostatic pressures checked and pt was orthostatic at that time as well. Advised to have pt eat and hydrate well.  As well as take medications as prescribed.  Pt's BP at last OV 03/24/13 was 100/60 pt taking Midodrine 5mg   TID and Florinef 0.1mg  QD.  Physical therapist concerned states pt had one of these episodes last month and ended up in the hospital.

## 2013-03-31 NOTE — Telephone Encounter (Signed)
If blood pressure continues to run high, would take 1/2 midodrine 3 times a day Continue on Florinef daily Call if he develops worsening for edema Then would decrease Florinef to every other day Continue to monitor blood pressure Weak legs likely not related to blood pressure May need to order home physical therapy Is he  on oxygen nasal cannula

## 2013-04-01 NOTE — Telephone Encounter (Signed)
Called spoke with pt he states BP ran high again yesterday.  Advised per Dr Mariah Milling to decrease Midodrine to 1/2 tablet TID.  Continue on Florinef daily, but call if develops worsening edema.  Advised pt to continue to monitor BP and call with further concerns or problems.  Pt has [facility] PT in home at present.  Pt is on oxygen at present 2L [facility].

## 2013-04-07 ENCOUNTER — Ambulatory Visit (INDEPENDENT_AMBULATORY_CARE_PROVIDER_SITE_OTHER): Payer: Medicare Other | Admitting: Physician Assistant

## 2013-04-07 ENCOUNTER — Encounter: Payer: Medicare Other | Admitting: Cardiovascular Disease

## 2013-04-07 ENCOUNTER — Encounter: Payer: Self-pay | Admitting: Physician Assistant

## 2013-04-07 VITALS — BP 132/80 | HR 71 | Ht 73.0 in | Wt 230.2 lb

## 2013-04-07 DIAGNOSIS — I509 Heart failure, unspecified: Secondary | ICD-10-CM

## 2013-04-07 DIAGNOSIS — N398 Other specified disorders of urinary system: Secondary | ICD-10-CM

## 2013-04-07 DIAGNOSIS — I4891 Unspecified atrial fibrillation: Secondary | ICD-10-CM

## 2013-04-07 DIAGNOSIS — I5032 Chronic diastolic (congestive) heart failure: Secondary | ICD-10-CM

## 2013-04-07 DIAGNOSIS — IMO0002 Reserved for concepts with insufficient information to code with codable children: Secondary | ICD-10-CM

## 2013-04-07 DIAGNOSIS — I25810 Atherosclerosis of coronary artery bypass graft(s) without angina pectoris: Secondary | ICD-10-CM

## 2013-04-07 DIAGNOSIS — I951 Orthostatic hypotension: Secondary | ICD-10-CM

## 2013-04-07 NOTE — Assessment & Plan Note (Signed)
Euvolemic on exam. Continue to hold Lasix for now. He will take on a sliding scale basis for weight gain 4-5lbs.

## 2013-04-07 NOTE — Telephone Encounter (Signed)
Will await recert paperwork.

## 2013-04-07 NOTE — Progress Notes (Signed)
Patient ID: Javier Relph., male   DOB: 1936-02-28, 77 y.o.   MRN: [id number]            Date:  04/07/2013   ID:  Javier Pair., DOB 1935-12-21, MRN [id number]  PCP:  Benay Pike, MD  Primary Cardiologist:  Concha Se, MD  History of Present Illness: Javier Dentinger. is a 77 y.o. male with h/o smoking 40 years, frequent admissions for severe COPD exacerbations/bronchitis, chronic respiratory failure, CAD s/p CABG in 2007, occlusion of 2 VGs with PCI x4, persistent AF/flutter (off anticoagulation 2/2 hematuria/GIBs), HFpEF, PVD with bilateral carotid endarterectomies in 2002, history of TIA in 2002, h/o prostate CA s/p brachytherapy, h/o GIB/hematuria and anemia who presents for 2 week follow-up.  He followed up with Dr. Mariah Milling two weeks ago after being admitted at [facility] in late July for syncope due to orthostatic hypotension. He was dehydrated and azotemic (BUN 30/Cr 1.54). Lasix was held and he was gently hydrated. He was started on fludrocortisone and midodrine last admission for orthostasis. BPs were noted to be labile as well. He also reported inappropriate urinary voiding. He is actively undergoing work up for thrombocytopenia by hematology- Dr. Neale Burly.   He returns today for 2 week follow-up. He reports experiencing intense itching, particularly of his scalp, shortly after taking midodrine. This is alleviated by benadryl, however he has been drowsy and sleeping most of the day as a result. BPs have improved (SBP 130-140s, occasional AM readings in 103-108 range). He feels well. No further episodes of positional lightheadedness. He continues to wear compression stockings and stay well hydrated. He is diligent about taking daily weights. These have remained stable.   EKG: atrial fibrillation, 71 bpm, no ST/T changes  Wt Readings from Last 3 Encounters:  04/07/13 230 lb 4 oz (104.441 kg)  03/24/13 232 lb 4 oz (105.348 kg)  02/17/13 234 lb 8 oz (106.369 kg)     Past Medical History    Diagnosis Date  . CAD (coronary artery disease)   . Hypertension   . Atypical angina   . PVD (peripheral vascular disease)   . Prostate cancer   . Hyperlipidemia   . COPD (chronic obstructive pulmonary disease)   . TIA (transient ischemic attack) 07/2010  . Myocardial infarction 11/2010  . Anemia     Current Outpatient Prescriptions  Medication Sig Dispense Refill  . albuterol (VENTOLIN HFA) 108 (90 BASE) MCG/ACT inhaler Inhale 2 puffs into the lungs every 6 (six) hours as needed for wheezing.      Marland Kitchen ALPRAZolam (XANAX) 0.25 MG tablet Take 0.25 mg by mouth at bedtime as needed.      Marland Kitchen amiodarone (PACERONE) 200 MG tablet Take 200 mg by mouth 2 (two) times daily.       . budesonide-formoterol (SYMBICORT) 160-4.5 MCG/ACT inhaler Inhale 2 puffs into the lungs 2 (two) times daily. 2 puffs daily        . calcium-vitamin D (OSCAL WITH D) 500-200 MG-UNIT per tablet Take 1 tablet by mouth 2 (two) times daily.       Marland Kitchen diltiazem (DILACOR XR) 120 MG 24 hr capsule Take 120 mg by mouth daily.      Marland Kitchen docusate sodium (COLACE) 100 MG capsule Take 100 mg by mouth 2 (two) times daily.      . Ferrous Sulfate (IRON) 325 (65 FE) MG TABS Take by mouth 2 (two) times daily.      . fludrocortisone (FLORINEF) 0.1 MG tablet Take 1  tablet (0.1 mg total) by mouth daily.  30 tablet  3  . furosemide (LASIX) 20 MG tablet 20 mg as needed.      Marland Kitchen HYDROcodone-acetaminophen (NORCO/VICODIN) 5-325 MG per tablet Take 1 tablet by mouth every 6 (six) hours as needed for pain.      Marland Kitchen ipratropium-albuterol (DUONEB) 0.5-2.5 (3) MG/3ML SOLN Take 3 mLs by nebulization as needed.      Marland Kitchen leuprolide, 6 Month, (ELIGARD) 45 MG injection Inject 45 mg into the skin every 6 (six) months.      . levalbuterol (XOPENEX) 1.25 MG/3ML nebulizer solution Take 1.25 mg by nebulization every 4 (four) hours as needed for wheezing.      Marland Kitchen losartan (COZAAR) 25 MG tablet Take 25 mg by mouth daily.      . midodrine (PROAMATINE) 5 MG tablet Take 2.5 mg by  mouth 3 (three) times daily.      . montelukast (SINGULAIR) 10 MG tablet Take 10 mg by mouth daily.      . MORPHINE SULFATE PO Take 20 mg by mouth as needed.       . Multiple Vitamins-Minerals (CENTRUM SILVER ULTRA MENS PO) Take 1 tablet by mouth daily.        . nitroGLYCERIN (NITROSTAT) 0.4 MG SL tablet Place 1 tablet (0.4 mg total) under the tongue every 5 (five) minutes as needed.  25 tablet  11  . Omega-3 Fatty Acids (OMEGA-3 FISH OIL) 500 MG CAPS Take by mouth. Takes 2 tablets daily.      Marland Kitchen omeprazole (PRILOSEC) 40 MG capsule Take 40 mg by mouth 2 (two) times daily.      . potassium chloride (K-DUR,KLOR-CON) 10 MEQ tablet Take 10 mEq by mouth as needed.      . rosuvastatin (CRESTOR) 20 MG tablet Take 20 mg by mouth daily.      Marland Kitchen senna (SENOKOT) 8.6 MG tablet Take 1 tablet by mouth daily.      . sertraline (ZOLOFT) 50 MG tablet Take 50 mg by mouth daily.      Marland Kitchen spironolactone (ALDACTONE) 25 MG tablet Take 25 mg by mouth daily.      Marland Kitchen tiotropium (SPIRIVA) 18 MCG inhalation capsule Place 18 mcg into inhaler and inhale daily.        Marland Kitchen topiramate (TOPAMAX) 25 MG capsule Take 25 mg by mouth 2 (two) times daily.      . vitamin C (ASCORBIC ACID) 500 MG tablet Take 500 mg by mouth daily.       No current facility-administered medications for this visit.    Allergies:    Allergies  Allergen Reactions  . Prednisone     Social History:  The patient  reports that he quit smoking about 5 years ago. He has never used smokeless tobacco. He reports that he does not drink alcohol or use illicit drugs.   Family History:  Family History  Problem Relation Age of Onset  . Heart disease Mother   . Stomach cancer Father   . Throat cancer Father   . Heart attack Brother     Review of Systems: General: positive for pruritis, negative for chills, fever, night sweats or weight changes.  Cardiovascular: negative for chest pain, dyspnea on exertion, edema, orthopnea, palpitations, paroxysmal nocturnal  dyspnea or shortness of breath Dermatological: negative for rash Respiratory: negative for cough or wheezing Urologic:  negative for hematuria Abdominal:  negative for nausea, vomiting, diarrhea, bright red blood per rectum, melena, or hematemesis Neurologic:  negative for  visual changes, syncope, or dizziness All other systems reviewed and are otherwise negative except as noted above.  PHYSICAL EXAM: VS:  BP 132/80  Pulse 71  Ht 6\' 1"  (1.854 m)  Wt 230 lb 4 oz (104.441 kg)  BMI 30.38 kg/m2 Obese, well developed male in no acute distress HEENT: normal, PERRL Neck: no JVD or bruits Cardiac:  normal S1, S2; RRR; no murmur or gallops Lungs:  clear to auscultation bilaterally, no wheezing, rhonchi or rales Abd: soft, nontender, no hepatomegaly, normoactive BS x 4 quads Ext: no edema, cyanosis or clubbing Skin: warm and dry, cap refill < 2 sec Neuro:  CNs 2-12 intact, no focal abnormalities noted Musculoskeletal: strength and tone appropriate for age  Psych: normal affect

## 2013-04-07 NOTE — Assessment & Plan Note (Addendum)
Rate-controlled. Continue diltiazem. Not a candidate for anticoagulation.

## 2013-04-07 NOTE — Patient Instructions (Addendum)
Please hold off on taking midodrine. Continue fludrocortisone as prescribed.   Continue to monitor your blood pressure and positional lightheadedness symptoms with this change.   If blood pressure runs too low with this, we will increase fludrocortisone to 0.2mg  daily. Please call if BPs run too low or symptoms develop.   Continue to monitor your weights daily. Stay within a well-hydrated range. If weight increases beyond 4-5 lbs, add back Lasix.   We will see you back on 9/26 at 10:45 AM with Dr. Mariah Milling.

## 2013-04-07 NOTE — Assessment & Plan Note (Signed)
BPs improved at home. No further orthostatic symptoms. Will stop midodrine as this has caused intense itching. Continue fludrocortisone. He will continue to monitor BPs and symptoms with this change. If BP record indicates labile numbers, will increase to 0.2mg  daily. He will continue compression stockings and fluid intake with close eye on weights and volume status. He has a narrow euvolemic window.

## 2013-04-07 NOTE — Assessment & Plan Note (Addendum)
He will continue to monitor the timing and volume of inappropriate voiding. May need to consider desmopressin as addressed last admission and recommend urology follow-up. He has a history of hematuria and prostate CA s/p brachytherapy as well.

## 2013-04-07 NOTE — Assessment & Plan Note (Signed)
Stable. No ischemic changes on EKG.

## 2013-04-16 DIAGNOSIS — I951 Orthostatic hypotension: Secondary | ICD-10-CM

## 2013-04-16 DIAGNOSIS — I509 Heart failure, unspecified: Secondary | ICD-10-CM

## 2013-04-16 DIAGNOSIS — R0602 Shortness of breath: Secondary | ICD-10-CM

## 2013-04-16 DIAGNOSIS — I25810 Atherosclerosis of coronary artery bypass graft(s) without angina pectoris: Secondary | ICD-10-CM

## 2013-04-16 DIAGNOSIS — I5032 Chronic diastolic (congestive) heart failure: Secondary | ICD-10-CM

## 2013-04-16 DIAGNOSIS — I4891 Unspecified atrial fibrillation: Secondary | ICD-10-CM

## 2013-04-19 ENCOUNTER — Ambulatory Visit: Payer: Self-pay | Admitting: Internal Medicine

## 2013-04-19 ENCOUNTER — Inpatient Hospital Stay (HOSPITAL_COMMUNITY)
Admission: AD | Admit: 2013-04-19 | Discharge: 2013-04-21 | DRG: 065 | Disposition: A | Discharge status: Home / Self Care | Payer: Medicare Other | Source: Other Acute Inpatient Hospital | Attending: Neurology | Admitting: Neurology

## 2013-04-19 ENCOUNTER — Emergency Department: Payer: Self-pay | Admitting: Emergency Medicine

## 2013-04-19 DIAGNOSIS — I4891 Unspecified atrial fibrillation: Secondary | ICD-10-CM

## 2013-04-19 DIAGNOSIS — I6529 Occlusion and stenosis of unspecified carotid artery: Secondary | ICD-10-CM | POA: Diagnosis present

## 2013-04-19 DIAGNOSIS — Z951 Presence of aortocoronary bypass graft: Secondary | ICD-10-CM

## 2013-04-19 DIAGNOSIS — D649 Anemia, unspecified: Secondary | ICD-10-CM | POA: Diagnosis present

## 2013-04-19 DIAGNOSIS — J449 Chronic obstructive pulmonary disease, unspecified: Secondary | ICD-10-CM | POA: Diagnosis present

## 2013-04-19 DIAGNOSIS — Z8546 Personal history of malignant neoplasm of prostate: Secondary | ICD-10-CM

## 2013-04-19 DIAGNOSIS — R319 Hematuria, unspecified: Secondary | ICD-10-CM

## 2013-04-19 DIAGNOSIS — Z79899 Other long term (current) drug therapy: Secondary | ICD-10-CM

## 2013-04-19 DIAGNOSIS — I252 Old myocardial infarction: Secondary | ICD-10-CM

## 2013-04-19 DIAGNOSIS — E785 Hyperlipidemia, unspecified: Secondary | ICD-10-CM | POA: Diagnosis present

## 2013-04-19 DIAGNOSIS — I6340 Cerebral infarction due to embolism of unspecified cerebral artery: Principal | ICD-10-CM | POA: Diagnosis present

## 2013-04-19 DIAGNOSIS — Z87891 Personal history of nicotine dependence: Secondary | ICD-10-CM

## 2013-04-19 DIAGNOSIS — I2510 Atherosclerotic heart disease of native coronary artery without angina pectoris: Secondary | ICD-10-CM | POA: Diagnosis present

## 2013-04-19 DIAGNOSIS — G8190 Hemiplegia, unspecified affecting unspecified side: Secondary | ICD-10-CM | POA: Diagnosis present

## 2013-04-19 DIAGNOSIS — I10 Essential (primary) hypertension: Secondary | ICD-10-CM | POA: Diagnosis present

## 2013-04-19 DIAGNOSIS — J4489 Other specified chronic obstructive pulmonary disease: Secondary | ICD-10-CM | POA: Diagnosis present

## 2013-04-19 DIAGNOSIS — I739 Peripheral vascular disease, unspecified: Secondary | ICD-10-CM | POA: Diagnosis present

## 2013-04-19 DIAGNOSIS — I4892 Unspecified atrial flutter: Secondary | ICD-10-CM | POA: Diagnosis present

## 2013-04-19 DIAGNOSIS — Z9861 Coronary angioplasty status: Secondary | ICD-10-CM

## 2013-04-19 DIAGNOSIS — Z9282 Status post administration of tPA (rtPA) in a different facility within the last 24 hours prior to admission to current facility: Secondary | ICD-10-CM

## 2013-04-19 DIAGNOSIS — I25810 Atherosclerosis of coronary artery bypass graft(s) without angina pectoris: Secondary | ICD-10-CM | POA: Diagnosis present

## 2013-04-19 LAB — BASIC METABOLIC PANEL
Anion Gap: 6 — ABNORMAL LOW (ref 7–16)
Calcium, Total: 9.5 mg/dL (ref 8.5–10.1)
Chloride: 105 mmol/L (ref 98–107)
Co2: 25 mmol/L (ref 21–32)
Creatinine: 1.52 mg/dL — ABNORMAL HIGH (ref 0.60–1.30)
EGFR (Non-African Amer.): 44 — ABNORMAL LOW
Potassium: 5.1 mmol/L (ref 3.5–5.1)
Sodium: 136 mmol/L (ref 136–145)

## 2013-04-19 LAB — CBC WITH DIFFERENTIAL/PLATELET
Basophil #: 0.1 10*3/uL (ref 0.0–0.1)
Basophil %: 1 %
Eosinophil %: 1 %
HCT: 40.2 % (ref 40.0–52.0)
Lymphocyte %: 13.5 %
MCHC: 32.9 g/dL (ref 32.0–36.0)
MCV: 87 fL (ref 80–100)
Monocyte %: 6.4 %
Neutrophil #: 10.8 10*3/uL — ABNORMAL HIGH (ref 1.4–6.5)
Neutrophil %: 78.1 %
Platelet: 150 10*3/uL (ref 150–440)
RBC: 4.61 10*6/uL (ref 4.40–5.90)
WBC: 13.9 10*3/uL — ABNORMAL HIGH (ref 3.8–10.6)

## 2013-04-19 LAB — MRSA PCR SCREENING: MRSA by PCR: NEGATIVE

## 2013-04-19 LAB — APTT: Activated PTT: 30.9 secs (ref 23.6–35.9)

## 2013-04-19 LAB — PROTIME-INR: INR: 1.1

## 2013-04-20 ENCOUNTER — Inpatient Hospital Stay (HOSPITAL_COMMUNITY): Payer: Medicare Other

## 2013-04-20 ENCOUNTER — Encounter (HOSPITAL_COMMUNITY): Payer: Self-pay

## 2013-04-20 DIAGNOSIS — I6529 Occlusion and stenosis of unspecified carotid artery: Secondary | ICD-10-CM

## 2013-04-20 DIAGNOSIS — I517 Cardiomegaly: Secondary | ICD-10-CM

## 2013-04-20 DIAGNOSIS — I4891 Unspecified atrial fibrillation: Secondary | ICD-10-CM

## 2013-04-20 LAB — LIPID PANEL
Cholesterol: 136 mg/dL (ref 0–200)
HDL: 43 mg/dL (ref >39)
Total CHOL/HDL Ratio: 3.2 RATIO
VLDL: 33 mg/dL (ref 0–40)

## 2013-04-20 MED ORDER — PANTOPRAZOLE SODIUM 40 MG IV SOLR
40.0000 mg | Freq: Every day | INTRAVENOUS | Status: DC
  Administered 2013-04-20 (×2): 40 mg via INTRAVENOUS
  Filled 2013-04-20 (×4): qty 40

## 2013-04-20 MED ORDER — LABETALOL HCL 5 MG/ML IV SOLN: 10.0000 mg | INTRAVENOUS | Status: DC | PRN

## 2013-04-20 MED ORDER — ACETAMINOPHEN 650 MG RE SUPP: 650.0000 mg | RECTAL | Status: DC | PRN

## 2013-04-20 MED ORDER — ACETAMINOPHEN 325 MG PO TABS
650.0000 mg | ORAL_TABLET | ORAL | Status: DC | PRN
Start: 2013-04-20 — End: 2013-04-21

## 2013-04-20 MED ORDER — SODIUM CHLORIDE 0.9 % IV SOLN
INTRAVENOUS | Status: DC
  Administered 2013-04-20 (×2): via INTRAVENOUS

## 2013-04-20 MED ORDER — SENNOSIDES-DOCUSATE SODIUM 8.6-50 MG PO TABS
1.0000 | ORAL_TABLET | Freq: Every evening | ORAL | Status: DC | PRN
  Filled 2013-04-20: qty 1

## 2013-04-20 MED ORDER — ASPIRIN EC 325 MG PO TBEC
325.0000 mg | DELAYED_RELEASE_TABLET | Freq: Every day | ORAL | Status: DC
  Administered 2013-04-20 – 2013-04-21 (×2): 325 mg via ORAL
  Filled 2013-04-20 (×2): qty 1

## 2013-04-20 NOTE — Progress Notes (Signed)
Stroke Team Progress Note  HISTORY Javier [name]. is an 77 y.o. male with a past medical history significant for HTN, CAD s/p CABG and stenting, MI, atrial fibrillation/flutter off anticoagulants due to hematuria on Xarelto 6 months ago, s/p bilateral CEA, PVD, TIA, PVD, prostate CA s/p brachytherapy, transferred to [facility] NICU after receiving IV tpa treatment at [facility] hospital earlier today.   Hospital records from that institution are not available for review at this moment.   Patient tells me that the only thing he recalls is waking up in the CT scan machine at that hospital. However, our nursing staff got the verbal report that he was apparently last known well at 1900 on 04/19/2013, when he started having weakness of the left hand and leg, left face droopiness, and slurred speech. He was taken to the ED whre he had NIHSS 16 and after discussion with another neurologist a decision to treat with IV tpa was made. Once again, outside hospital records are unavailable for further review.   At [facility], neurology evaluation documented at 0004am on 04/20/2013, the patient said that he is doing much better but still thinks that the left hand is weaker than before.  Denies headache, vertigo, double vision, difficulty swallowing, slurred speech, language or vision impairment.   Date last known well: 04/19/13 (sent to [facility] and arrived at 2201 on 04/19/2013 to room 3M12) Time last known well: 1900  tPA Given: yes  NIHSS: 16  MRS: 0   He was admitted to the neuro ICU for further evaluation and treatment.  SUBJECTIVE Lying flat in bed. Feels that symptoms are quickly resolving. No family in room.  History of anticoagulation cessation due to hematuria and GIBleeds (this was on xarelto). Became anemic, followed by hematology and had needed midodrine.  OBJECTIVE Most recent Vital Signs: Filed Vitals:   04/20/13 1100 04/20/13 1200 04/20/13 1300 04/20/13 1400  BP: 117/63 119/71 125/79 121/68  Pulse: 78 78 85  80  Temp:  97.8 F (36.6 C)    TempSrc:  Oral    Resp: 25 24 17 18   SpO2: 98% 96% 96% 92%   CBG (last 3)  No results found for this basename: GLUCAP,  in the last 72 hours  IV Fluid Intake:   . sodium chloride 75 mL/hr at 04/20/13 0800    MEDICATIONS  . pantoprazole (PROTONIX) IV  40 mg Intravenous QHS   PRN:  acetaminophen, acetaminophen, labetalol, senna-docusate  Diet:  Cardiac thin liquids Activity:  Bedrest DVT Prophylaxis:  SCD  CLINICALLY SIGNIFICANT STUDIES Basic Metabolic Panel: No results found for this basename: NA, K, CL, CO2, GLUCOSE, BUN, CREATININE, CALCIUM, MG, PHOS,  in the last 168 hours Liver Function Tests: No results found for this basename: AST, ALT, ALKPHOS, BILITOT, PROT, ALBUMIN,  in the last 168 hours CBC: No results found for this basename: WBC, NEUTROABS, HGB, HCT, MCV, PLT,  in the last 168 hours Coagulation: No results found for this basename: LABPROT, INR,  in the last 168 hours Cardiac Enzymes: No results found for this basename: CKTOTAL, CKMB, CKMBINDEX, TROPONINI,  in the last 168 hours Urinalysis: No results found for this basename: COLORURINE, APPERANCEUR, LABSPEC, PHURINE, GLUCOSEU, HGBUR, BILIRUBINUR, KETONESUR, PROTEINUR, UROBILINOGEN, NITRITE, LEUKOCYTESUR,  in the last 168 hours Lipid Panel    Component Value Date/Time   CHOL 136 04/20/2013 0500   TRIG 163* 04/20/2013 0500   HDL 43 04/20/2013 0500   CHOLHDL 3.2 04/20/2013 0500   VLDL 33 04/20/2013 0500   LDLCALC  60 04/20/2013 0500   HgbA1C  No results found for this basename: HGBA1C    Urine Drug Screen:   No results found for this basename: labopia,  cocainscrnur,  labbenz,  amphetmu,  thcu,  labbarb    Alcohol Level: No results found for this basename: ETH,  in the last 168 hours  Dg Chest Port 1 View 04/20/2013  Hazy opacity at left base most likely due to a combination of volume loss and pleural fluid.  Consolidation is not excluded.   CT of the brain    MRI of the brain    MRA of  the brain    2D Echocardiogram    Carotid Doppler    EKG  Atrial flutter from 04/07/2013.   Therapy Recommendations   Physical Exam   Pleasant elderly caucasian male not in distress.Awake alert. Afebrile. Head is nontraumatic. Neck is supple without bruit. Hearing is normal. Cardiac exam no murmur or gallop. Lungs are clear to auscultation. Distal pulses are well felt. Neurological Exam ; Awake alert oriented x 3 normal speech and language. Mild left lower face asymmetry. Tongue midline. No drift. Mild diminished fine finger movements on left. Orbits right over left upper extremity. Mild left grip weak.. Normal sensation . Normal coordination. ASSESSMENT Javier [name]. is a 77 y.o. male presenting with acute delirium, left hemiparesis, left facial droop and slurred speech. NIHSS at [facility]  15 and IV tPA was started and patient transferred here. I have not seen transfer paperwork at this time. Followup tPA Imaging pending today. . Infarct felt to be embolic secondary to known atrial fib now off anticoagulants due to previous GIB and hematuria.  On no antithrombotics prior to admission. Now on no antithrombotics for secondary stroke prevention, awaiting follow up head CT s/p tPA imaging. Patient with resultant left hemiparesis. Work up underway.   Coronary artery disease (CABG with stenting)  Hypertension  Atrial fib/flutter  Hematuria on xarelto  Vascular disease: s/p bilateral CEA  History of prostate cancer; s/p brachytherapy  Hyperlipidemia, LDL 60, at goal < 100  COPD   Hospital day # 1  TREATMENT/PLAN  Await CT Head to order aspirin by midnight  Recheck labs: cbc, cmp  If liver panel normal, restart Crestor as was on at home  Risk factor modification  Therapy evaluations  Gwendolyn Lima. Manson Passey, [facility], MBA, MHA [facility] Pager: [phone] 04/20/2013 2:45 PM This patient is critically ill and at significant risk of neurological worsening, death and  care requires constant monitoring of vital signs, hemodynamics,respiratory and cardiac monitoring,review of multiple databases, neurological assessment, discussion with family, other specialists and medical decision making of high complexity. I spent 30 minutes of neurocritical care time  in the care of  this patient. I have personally obtained a history, examined the patient, evaluated imaging results, and formulated the assessment and plan of care. I agree with the above.

## 2013-04-20 NOTE — Progress Notes (Signed)
UR completed 

## 2013-04-20 NOTE — Progress Notes (Signed)
Bilateral carotid artery duplex completed:  1-39% ICA stenosis.  Vertebral artery flow is antegrade.     

## 2013-04-20 NOTE — Consult Note (Addendum)
Referring Physician: ED [facility]    Chief Complaint: stroke s/p IV TPA  HPI:                                                                                                                                         Javier [name]. is an 77 y.o. male with a past medical history significant for HTN, CAD s/p CABG and stenting, MI, atrial fibrillation/flutter off anticoagulants due to hematuria on Xarelto 6 months ago, s/p bilateral CEA, PVD, TIA, PVD, prostate CA s/p brachytherapy, transferred to [facility] NICU after receiving IV tpa treatment at [facility] hospital earlier today. Hospital records from that institution are not available for review at this moment. Patient tells me that the only thing he recalls is waking up in the CT scan machine at that hospital. However, our nursing staff  got the verbal report that he was apparently last known well at 1900 today, when he started having weakness of the left hand and leg, left face droopiness, and slurred speech. He was taken to the ED whre he had NIHSS 16 and after discussion with another neurologist a decision to treat with IV tpa was made. Once again, outside hospital records are unavailable for further review. At this moment, patient said that he is doing much better but still thinks that the left hand is weaker than before. Denies headache, vertigo, double vision, difficulty swallowing, slurred speech, language or vision impairment.  Date last known well: 04/19/13 Time last known well: 1900 tPA Given: yes NIHSS: 16  MRS: 0  Past Medical History  Diagnosis Date  . CAD (coronary artery disease)   . Hypertension   . Atypical angina   . PVD (peripheral vascular disease)   . Prostate cancer   . Hyperlipidemia   . COPD (chronic obstructive pulmonary disease)   . TIA (transient ischemic attack) 07/2010  . Myocardial infarction 11/2010  . Anemia     Past Surgical History  Procedure Laterality Date  . Coronary artery bypass graft      6  years ago  . Carotid endarterectomy      bilateral  . Cardiac catheterization  2009, 2012    stents placed    Family History  Problem Relation Age of Onset  . Heart disease Mother   . Stomach cancer Father   . Throat cancer Father   . Heart attack Brother    Social History:  reports that he quit smoking about 5 years ago. He has never used smokeless tobacco. He reports that he does not drink alcohol or use illicit drugs.  Allergies:  Allergies  Allergen Reactions  . Prednisone     Medications:  I have reviewed the patient's current medications.  ROS:                                                                                                                                       History obtained from the patient, nursing staff  General ROS: negative for - chills, fatigue, fever, night sweats, weight gain or weight loss Psychological ROS: negative for - behavioral disorder, hallucinations, memory difficulties, mood swings or suicidal ideation Ophthalmic ROS: negative for - blurry vision, double vision, eye pain or loss of vision ENT ROS: negative for - epistaxis, nasal discharge, oral lesions, sore throat, tinnitus or vertigo Allergy and Immunology ROS: negative for - hives or itchy/watery eyes Hematological and Lymphatic ROS: negative for - bleeding problems, bruising or swollen lymph nodes Endocrine ROS: negative for - galactorrhea, hair pattern changes, polydipsia/polyuria or temperature intolerance Respiratory ROS: negative for - cough, hemoptysis, shortness of breath or wheezing Cardiovascular ROS: negative for - chest pain, dyspnea on exertion, edema or irregular heartbeat Gastrointestinal ROS: negative for - abdominal pain, diarrhea, hematemesis, nausea/vomiting or stool incontinence Genito-Urinary ROS: negative for - dysuria, hematuria, incontinence  or urinary frequency/urgency Musculoskeletal ROS: negative for - joint swelling Neurological ROS: as noted in HPI Dermatological ROS: negative for rash and skin lesion changes      Physical exam: pleasant male in no apparent distress.  Blood pressure 124/81, pulse 74, temperature 98.2 F (36.8 C), temperature source Oral, resp. rate 18, SpO2 97.00%. Head: normocephalic. Neck: supple, no bruits, no JVD. Cardiac: no murmurs. Lungs: clear. Abdomen: soft, no tender, no mass. Extremities: no edema.  Neurologic Examination:                                                                                                      Mental Status: Alert, awake, oriented x 4, thought content appropriate.  Comprehension, naming, and repetition intact. Speech fluent without evidence of aphasia.  Able to follow 3 step commands without difficulty. Cranial Nerves: II: Discs flat bilaterally; Visual fields grossly normal, pupils equal, round, reactive to light and accommodation III,IV, VI: ptosis not present, extra-ocular motions intact bilaterally V,VII: smile symmetric, facial light touch sensation normal bilaterally VIII: hearing normal bilaterally IX,X: gag reflex present XI: bilateral shoulder shrug XII: midline tongue extension Motor: Significant for mild weakness left arm-hand greater than leg. Tone and bulk:normal tone throughout; no atrophy noted Sensory: Pinprick and light touch intact throughout, bilaterally Deep Tendon Reflexes:  1+ all over Plantars: Right: downgoing  Left: downgoing Cerebellar: normal finger-to-nose,  normal heel-to-shin test Gait:  No tested. CV: pulses palpable throughout    Results for orders placed during the hospital encounter of 04/19/13 (from the past 48 hour(s))  MRSA PCR SCREENING     Status: None   Collection Time    04/19/13 10:06 PM      Result Value Range   MRSA by PCR NEGATIVE  NEGATIVE   Comment:            The GeneXpert MRSA Assay (FDA      approved for NASAL specimens     only), is one component of a     comprehensive MRSA colonization     surveillance program. It is not     intended to diagnose MRSA     infection nor to guide or     monitor treatment for     MRSA infections.   No results found.   Assessment: 77 y.o. male with a neurological syndrome consistent with a vascular insult involving right brain, s/p IV tpa at [facility] hospital earlier today. Outside records unavailable at this moment. Patient doing well at this moment, with mild weakness left side. Continue post IV tpa protocol. Stroke team to resume care.  Stroke Risk Factors age, HTN, atrial fibrillation, CAD, TIA.   Plan: 1. HgbA1c, fasting lipid panel 2. MRI, MRA  of the brain without contrast 3. Echocardiogram 4. Carotid dopplers 5. Prophylactic therapy-AS PER POST IV TPA PROTOCOL 6. Risk factor modification 7. Telemetry monitoring 8. Frequent neuro checks 9. PT/OT SLP   Wyatt Portela, MD [facility] [phone]  04/20/2013, 12:05 AM

## 2013-04-20 NOTE — Progress Notes (Signed)
  Echocardiogram 2D Echocardiogram has been performed.  Javier [name] 04/20/2013, 3:56 PM

## 2013-04-21 ENCOUNTER — Inpatient Hospital Stay (HOSPITAL_COMMUNITY): Payer: Medicare Other

## 2013-04-21 DIAGNOSIS — Z79899 Other long term (current) drug therapy: Secondary | ICD-10-CM

## 2013-04-21 DIAGNOSIS — Z8546 Personal history of malignant neoplasm of prostate: Secondary | ICD-10-CM

## 2013-04-21 DIAGNOSIS — R319 Hematuria, unspecified: Secondary | ICD-10-CM

## 2013-04-21 LAB — CBC
HCT: 37.6 % — ABNORMAL LOW (ref 39.0–52.0)
Hemoglobin: 12.9 g/dL — ABNORMAL LOW (ref 13.0–17.0)
MCV: 85.6 fL (ref 78.0–100.0)
RBC: 4.39 MIL/uL (ref 4.22–5.81)
RDW: 13.8 % (ref 11.5–15.5)
WBC: 10.8 10*3/uL — ABNORMAL HIGH (ref 4.0–10.5)

## 2013-04-21 LAB — COMPREHENSIVE METABOLIC PANEL
Albumin: 3.2 g/dL — ABNORMAL LOW (ref 3.5–5.2)
Alkaline Phosphatase: 45 U/L (ref 39–117)
BUN: 20 mg/dL (ref 6–23)
Calcium: 9.4 mg/dL (ref 8.4–10.5)
GFR calc Af Amer: 54 mL/min — ABNORMAL LOW (ref >90)
Glucose, Bld: 88 mg/dL (ref 70–99)
Potassium: 3.8 mEq/L (ref 3.5–5.1)
Total Protein: 7.3 g/dL (ref 6.0–8.3)

## 2013-04-21 MED ORDER — PANTOPRAZOLE SODIUM 40 MG PO TBEC
40.0000 mg | DELAYED_RELEASE_TABLET | Freq: Every day | ORAL | Status: DC
  Administered 2013-04-21: 40 mg via ORAL
  Filled 2013-04-21: qty 1

## 2013-04-21 MED ORDER — ATORVASTATIN CALCIUM 40 MG PO TABS
40.0000 mg | ORAL_TABLET | Freq: Every day | ORAL | Status: DC
  Filled 2013-04-21: qty 1

## 2013-04-21 MED ORDER — ASPIRIN 325 MG PO TBEC: 325.0000 mg | DELAYED_RELEASE_TABLET | Freq: Every day | ORAL | Status: DC

## 2013-04-21 NOTE — Progress Notes (Signed)
Occupational Therapy Treatment Patient Details Name: Javier [name]. MRN: [id number] DOB: Nov 29, 1935 Today's Date: 04/21/2013 Time: 4098-1191 OT Time Calculation (min): 18 min  OT Assessment / Plan / Recommendation  History of present illness [name] is a 77 y.o. male presenting with acute delirium, left hemiparesis, left facial droop and slurred speech. NIHSS at [facility]  15 and IV tPA was started and patient transferred here.    OT comments  Completed education on HEP for fine motor/coordination and strengthening LUE.. Written handout given. theraputty and exercise ball given. Pt encouraged to refrain from driving at this time.Further OT to be addressed by HHOT.  Follow Up Recommendations  Home health OT    Barriers to Discharge       Equipment Recommendations  None recommended by OT    Recommendations for Other Services    Frequency Min 2X/week   Progress towards OT Goals  goal met  Plan Discharge plan remains appropriate    Precautions / Restrictions Precautions Precautions: None   Pertinent Vitals/Pain no apparent distress           OT Diagnosis:    OT Problem List:   OT Treatment Interventions:     OT Goals(current goals can now be found in the care plan section) Acute Rehab OT Goals Patient Stated Goal: Return home OT Goal Formulation: With patient Time For Goal Achievement: 05/05/13 Potential to Achieve Goals: Good (goals met)  Visit Information  Last OT Received On: 04/21/13 Assistance Needed: +1 History of Present Illness: [name] is a 77 y.o. male presenting with acute delirium, left hemiparesis, left facial droop and slurred speech. NIHSS at [facility]  15 and IV tPA was started and patient transferred here.                            Exercises  Other Exercises Other Exercises: fine motor/coordination HEP Other Exercises: LUE grip/pinchstrengthening Other Exercises: Lshoulder/UE PNF strengthening   Balance     End  of Session OT - End of Session Activity Tolerance: Patient tolerated treatment well Patient left: in chair;with call bell/phone within reach Nurse Communication: Mobility status  GO     Sharna Gabrys,HILLARY 04/21/2013, 5:43 PM [facility], OTR/L  [phone] 04/21/2013

## 2013-04-21 NOTE — Progress Notes (Signed)
Stroke Team Progress Note  HISTORY Javier [name]. is an 77 y.o. male with a past medical history significant for HTN, CAD s/p CABG and stenting, MI, atrial fibrillation/flutter off anticoagulants due to hematuria on Xarelto 6 months ago, s/p bilateral CEA, PVD, TIA, PVD, prostate CA s/p brachytherapy, transferred to [facility] NICU after receiving IV tpa treatment at [facility] hospital earlier today.   Hospital records from that institution are not available for review at this moment.   Patient tells me that the only thing he recalls is waking up in the CT scan machine at that hospital. However, our nursing staff got the verbal report that he was apparently last known well at 1900 on 04/19/2013, when he started having weakness of the left hand and leg, left face droopiness, and slurred speech. He was taken to the ED whre he had NIHSS 16 and after discussion with another neurologist a decision to treat with IV tpa was made. Once again, outside hospital records are unavailable for further review.   At [facility], neurology evaluation documented at 0004am on 04/20/2013, the patient said that he is doing much better but still thinks that the left hand is weaker than before.  Denies headache, vertigo, double vision, difficulty swallowing, slurred speech, language or vision impairment.   Date last known well: 04/19/13 (sent to [facility] and arrived at 2201 on 04/19/2013 to room 3M12) Time last known well: 1900  tPA Given: yes  NIHSS: 16  MRS: 0   He was admitted to the neuro ICU for further evaluation and treatment.  SUBJECTIVE Sitting up. Feels that symptoms are quickly resolving.   OBJECTIVE Most recent Vital Signs: Filed Vitals:   04/21/13 0900 04/21/13 1000 04/21/13 1100 04/21/13 1230  BP: 121/67  126/72 126/81  Pulse: 78 95 66   Temp:      TempSrc:      Resp: 22 25 23    Height:      Weight:      SpO2: 96% 95% 96% 95%   CBG (last 3)  No results found for this basename: GLUCAP,  in the last 72  hours  IV Fluid Intake:      MEDICATIONS  . aspirin EC  325 mg Oral Daily  . pantoprazole  40 mg Oral Daily   PRN:  acetaminophen, acetaminophen, labetalol, senna-docusate  Diet:  Cardiac thin liquids Activity:  Ambulate with assist DVT Prophylaxis:  SCD  CLINICALLY SIGNIFICANT STUDIES Basic Metabolic Panel:   Recent Labs Lab 04/21/13 0600  NA 141  K 3.8  CL 106  CO2 23  GLUCOSE 88  BUN 20  CREATININE 1.43*  CALCIUM 9.4   Liver Function Tests:   Recent Labs Lab 04/21/13 0600  AST 18  ALT 21  ALKPHOS 45  BILITOT 0.3  PROT 7.3  ALBUMIN 3.2*   CBC:   Recent Labs Lab 04/21/13 0600  WBC 10.8*  HGB 12.9*  HCT 37.6*  MCV 85.6  PLT 166   Coagulation: No results found for this basename: LABPROT, INR,  in the last 168 hours Cardiac Enzymes: No results found for this basename: CKTOTAL, CKMB, CKMBINDEX, TROPONINI,  in the last 168 hours Urinalysis: No results found for this basename: COLORURINE, APPERANCEUR, LABSPEC, PHURINE, GLUCOSEU, HGBUR, BILIRUBINUR, KETONESUR, PROTEINUR, UROBILINOGEN, NITRITE, LEUKOCYTESUR,  in the last 168 hours Lipid Panel    Component Value Date/Time   CHOL 136 04/20/2013 0500   TRIG 163* 04/20/2013 0500   HDL 43 04/20/2013 0500   CHOLHDL 3.2 04/20/2013 0500  VLDL 33 04/20/2013 0500   LDLCALC 60 04/20/2013 0500   HgbA1C  Lab Results  Component Value Date   HGBA1C 6.2* 04/20/2013    Urine Drug Screen:   No results found for this basename: labopia,  cocainscrnur,  labbenz,  amphetmu,  thcu,  labbarb    Alcohol Level: No results found for this basename: ETH,  in the last 168 hours  Dg Chest Port 1 View 04/20/2013  Hazy opacity at left base most likely due to a combination of volume loss and pleural fluid.  Consolidation is not excluded.   CT of the brain   04/21/2013 No acute abnormality.  MRI of the brain No acute infarction. Atrophy and moderate chronic small vessel disease throughout the cerebral hemispheric white matter    MRA of the  brain Normal intracranial MR angiography of the large and medium-sized vessels     2D Echocardiogram  EF 60%, wall motion normal, left atrium normal  Carotid Doppler  Bilateral carotid artery duplex completed: 1-39% ICA stenosis. Vertebral artery flow is antegrade  EKG  Atrial flutter from 04/07/2013.   Therapy Recommendations   Physical Exam   Pleasant elderly caucasian male not in distress.Awake alert. Afebrile. Head is nontraumatic. Neck is supple without bruit. Hearing is normal. Cardiac exam no murmur or gallop. Lungs are clear to auscultation. Distal pulses are well felt. Neurological Exam ; Awake alert oriented x 3 normal speech and language. Mild left lower face asymmetry. Tongue midline. No drift. Mild diminished fine finger movements on left. Orbits right over left upper extremity. Mild left grip weak.. Normal sensation . Normal coordination.  ASSESSMENT Mr. Marbin Olshefski. is a 77 y.o. male presenting with acute delirium, left hemiparesis, left facial droop and slurred speech. NIHSS at [facility]  15 and IV tPA was started and patient transferred here. I have not seen transfer paperwork at this time. Followup MRI did not show definite stroke Infarct felt to be embolic secondary to known atrial fib now off anticoagulants due to previous GIB and hematuria.  On no antithrombotics prior to admission. Now on Aspirin 325mg  daily for secondary stroke prevention, awaiting follow up head CT s/p tPA imaging. Patient with resultant left hemiparesis. Work up underway.   Coronary artery disease (CABG with stenting)  Hypertension  Atrial fib/flutter  Hematuria on xarelto  Vascular disease: s/p bilateral CEA  History of prostate cancer; s/p brachytherapy  Hyperlipidemia, LDL 60, at goal < 100  COPD   Hospital day # 2  TREATMENT/PLAN  MR reviewed  Recheck labs: cbc, cmp  Restart Crestor (had to start lipitor as non-formulary)  Risk factor modification  Therapy  evaluations  If further episodes on aspirin (unable to take xarelto due to GIBleed/anemia), would progress to eliquis.  Gwendolyn Lima. Manson Passey, [facility], MBA, MHA [facility] Pager: [phone] 04/21/2013 1:37 PM   I have personally obtained a history, examined the patient, evaluated imaging results, and formulated the assessment and plan of care. I agree with the above. Delia Heady, MD

## 2013-04-21 NOTE — Progress Notes (Signed)
HHPT/OT arranged to resume with [facility] as pt was previous patient with them and is discharging home today.

## 2013-04-21 NOTE — Evaluation (Signed)
Speech Language Pathology Evaluation Patient Details Name: Joshu Furukawa. MRN: [id number] DOB: Sep 19, 1935 Today's Date: 04/21/2013 Time: 6962-9528 SLP Time Calculation (min): 15 min  Problem List:  Patient Active Problem List   Diagnosis Date Noted  . Voiding dysfunction 04/07/2013  . Orthostatic hypotension 03/24/2013  . Chronic diastolic CHF (congestive heart failure) 03/24/2013  . Atrial fibrillation 01/07/2013  . Premature ventricular contractions 12/22/2012  . Preop cardiovascular exam 11/06/2012  . Anemia 09/28/2012  . Hypertension 02/14/2012  . Edema 01/23/2012  . Weakness 01/23/2012  . Sweating 12/30/2011  . TIA 08/09/2010  . HYPERLIPIDEMIA-MIXED 05/28/2010  . CAD, ARTERY BYPASS GRAFT 05/28/2010  . CAROTID ARTERY STENOSIS, WITHOUT INFARCTION 05/28/2010  . COPD 05/28/2010   Past Medical History:  Past Medical History  Diagnosis Date  . CAD (coronary artery disease)   . Hypertension   . Atypical angina   . PVD (peripheral vascular disease)   . Prostate cancer   . Hyperlipidemia   . COPD (chronic obstructive pulmonary disease)   . TIA (transient ischemic attack) 07/2010  . Myocardial infarction 11/2010  . Anemia    Past Surgical History:  Past Surgical History  Procedure Laterality Date  . Coronary artery bypass graft      6 years ago  . Carotid endarterectomy      bilateral  . Cardiac catheterization  2009, 2012    stents placed   HPI:  77 y.o. male with a past medical history significant for HTN, CAD s/p CABG and stenting, MI, atrial fibrillation/flutter off anticoagulants due to hematuria on Xarelto 6 months ago, s/p bilateral CEA, PVD, TIA, PVD, prostate CA s/p brachytherapy, transferred to [facility] NICU after receiving IV tpa treatment at [facility] hospital earlier today. Patient tells me that the only thing he recalls is waking up in the CT scan machine at that hospital. However, our nursing staff got the verbal report that he was apparently last known well at  1900 today, when he started having weakness of the left hand and leg, left face droopiness, and slurred speech. He was taken to the ED whre he had NIHSS 16 and after discussion with another neurologist a decision to treat with IV tpa was made.    Assessment / Plan / Recommendation Clinical Impression  Pt. exhibits functional speech-language-cognitive abilities for items assessed through verbal evaluation.  Pt. reports mildly decreasing memory for several years.  Therapeutic intervention included suggestion of strategies to assist with working and prospective memory including pill box, calendar, written information.  He states wife keeps track of appointments.  He uses a pill box and also writes down weight and blood pressure (meds or record vitals?) each day.  No ST is needed present and pt./family are in agreement.    SLP Assessment  Patient does not need any further Speech Lanaguage Pathology Services    Follow Up Recommendations  None    Frequency and Duration        Pertinent Vitals/Pain none      SLP Evaluation Prior Functioning  Cognitive/Linguistic Baseline: Baseline deficits Baseline deficit details:  (reports mild memory deficits past 2 years) Type of Home: House Available Help at Discharge: Family;Available 24 hours/day Vocation: Retired   IT consultant  Overall Cognitive Status: Within Functional Limits for tasks assessed Orientation Level: Oriented X4 Safety/Judgment: Appears intact    Comprehension  Auditory Comprehension Overall Auditory Comprehension: Appears within functional limits for tasks assessed Visual Recognition/Discrimination Discrimination: Not tested Reading Comprehension Reading Status: Not tested    Expression Expression Primary  Mode of Expression: Verbal Verbal Expression Overall Verbal Expression: Appears within functional limits for tasks assessed Naming: Not tested Pragmatics: No impairment Written Expression Dominant Hand: Left Written  Expression: Not tested   Oral / Motor Oral Motor/Sensory Function Overall Oral Motor/Sensory Function: Appears within functional limits for tasks assessed Motor Speech Overall Motor Speech: Appears within functional limits for tasks assessed Motor Planning: Witnin functional limits        Breck Coons [employer] [phone]  04/21/2013

## 2013-04-21 NOTE — Progress Notes (Signed)
Occupational Therapy Evaluation Patient Details Name: Javier [name]. MRN: [id number] DOB: May 07, 1936 Today's Date: 04/21/2013 Time: 1008-1030 OT Time Calculation (min): 22 min  OT Assessment / Plan / Recommendation History of present illness Javier [name]. is a 77 y.o. male presenting with acute delirium, left hemiparesis, left facial droop and slurred speech. NIHSS at [facility]  15 and IV tPA was started and patient transferred here.    Clinical Impression   Pt with primarily motor deficits with RUE. Sensation appears normal. Will attempt to see pt again today to begin HEP. Pt will need HHOT to address LUE rehab.    OT Assessment  Patient needs continued OT Services    Follow Up Recommendations  Home health OT;Supervision - Intermittent    Barriers to Discharge      Equipment Recommendations  None recommended by OT    Recommendations for Other Services    Frequency  Min 2X/week    Precautions / Restrictions Precautions Precautions: None Restrictions Weight Bearing Restrictions: No   Pertinent Vitals/Pain no apparent distress     ADL  Eating/Feeding: Modified independent Where Assessed - Eating/Feeding: Chair ADL Comments: overall Mod I with all ADL    OT Diagnosis: Generalized weakness;Other (comment) (paresis KUE)  OT Problem List: Decreased strength;Decreased activity tolerance;Impaired balance (sitting and/or standing);Decreased coordination;Impaired UE functional use OT Treatment Interventions: Therapeutic exercise;Neuromuscular education;Therapeutic activities;Patient/family education   OT Goals(Current goals can be found in the care plan section) Acute Rehab OT Goals Patient Stated Goal: Return home OT Goal Formulation: With patient Time For Goal Achievement: 05/05/13 Potential to Achieve Goals: Good  Visit Information  Last OT Received On: 04/21/13 Assistance Needed: +1 History of Present Illness: Javier [name]. is a 77 y.o. male  presenting with acute delirium, left hemiparesis, left facial droop and slurred speech. NIHSS at [facility]  15 and IV tPA was started and patient transferred here.        Prior Functioning     Home Living Family/patient expects to be discharged to:: Private residence Living Arrangements: Spouse/significant other;Children Available Help at Discharge: Family;Available 24 hours/day Type of Home: House Home Access: Stairs to enter [employer] of Steps: 5 Entrance Stairs-Rails: Right Home Layout: One level Home Equipment: Walker - 2 wheels Prior Function Level of Independence: Independent with assistive device(s) Comments: Amb with rolling walker.  Communication Communication: No difficulties Dominant Hand: Left         Vision/Perception Vision - History Baseline Vision: Wears glasses only for reading Patient Visual Report: No change from baseline Vision - Assessment Eye Alignment: Within Functional Limits Vision Assessment: Vision tested Ocular Range of Motion: Within Functional Limits Alignment/Gaze Preference: Within Defined Limits Tracking/Visual Pursuits: Decreased smoothness of eye movement to LEFT inferior field Visual Fields: No apparent deficits Perception Perception: Within Functional Limits Praxis Praxis: Intact   Cognition  Cognition Arousal/Alertness: Awake/alert Behavior During Therapy: WFL for tasks assessed/performed Overall Cognitive Status: Within Functional Limits for tasks assessed    Extremity/Trunk Assessment Upper Extremity Assessment Upper Extremity Assessment: LUE deficits/detail LUE Deficits / Details: general weaness @3 +/5 throughout LUE Coordination: decreased fine motor;decreased gross motor Lower Extremity Assessment Lower Extremity Assessment: Defer to PT evaluation Cervical / Trunk Assessment Cervical / Trunk Assessment: Normal     Mobility Bed Mobility Bed Mobility: Supine to Sit;Sitting - Scoot to Edge of Bed Supine to  Sit: 6: Modified independent (Device/Increase time);HOB elevated Sitting - Scoot to Edge of Bed: 6: Modified independent (Device/Increase time) Transfers Sit to Stand:  6: Modified independent (Device/Increase time);With upper extremity assist;With armrests;From bed;From chair/3-in-1;From toilet Stand to Sit: 6: Modified independent (Device/Increase time);With upper extremity assist;With armrests;To chair/3-in-1;To toilet     Exercise     Balance Balance Balance Assessed: Yes Static Standing Balance Static Standing - Balance Support: No upper extremity supported;During functional activity Static Standing - Level of Assistance: 6: Modified independent (Device/Increase time)   End of Session OT - End of Session Activity Tolerance: Patient tolerated treatment well Patient left: in chair;with call bell/phone within reach;with family/visitor present Nurse Communication: Mobility status  GO     Kiano Terrien,HILLARY 04/21/2013, 1:06 PM [facility], OTR/L  [phone] 04/21/2013

## 2013-04-21 NOTE — Progress Notes (Signed)
Stroke Discharge Summary   Patient ID: Javier W Myron Jr. l  MRN: [id number]  DOB: 12/14/1935  Date of Admission: 04/19/2013  Date of Discharge: 04/21/2013  Attending Physician: Pramodkumar Amarian Botero, MD, Stroke MD  Consulting Physician(s): None  Patient's PCP: QUINN,TODD S, MD  Discharge Diagnoses: Right brain stroke not visualized on MRI treated with iv TPA with excellent clinical recovery etiology likely cardioembolic given h/o atrial flutter/fibrillation but not on anticoagulation due h/o recent hematuria  Active Problems:  HYPERLIPIDEMIA-MIXED  CAD, ARTERY BYPASS GRAFT  CAROTID ARTERY STENOSIS, WITHOUT INFARCTION  COPD  Hypertension  Atrial fibrillation  Hematuria  H/O prostate cancer  Encounter for long-term (current) use of high-risk medication  BMI: Body mass index is 29.7 kg/(m^2).  Past Medical History   Diagnosis  Date   .  CAD (coronary artery disease)    .  Hypertension    .  Atypical angina    .  PVD (peripheral vascular disease)    .  Prostate cancer    .  Hyperlipidemia    .  COPD (chronic obstructive pulmonary disease)    .  TIA (transient ischemic attack)  07/2010   .  Myocardial infarction  11/2010   .  Anemia     Past Surgical History   Procedure  Laterality  Date   .  Coronary artery bypass graft       6 years ago   .  Carotid endarterectomy       bilateral   .  Cardiac catheterization   2009, 2012     stents placed      Medication List     STOP taking these medications       amiodarone 200 MG tablet    Commonly known as: PACERONE    CENTRUM SILVER ULTRA MENS PO    diltiazem 120 MG 24 hr capsule    Commonly known as: DILACOR XR    furosemide 20 MG tablet    Commonly known as: LASIX    guaiFENesin 600 MG 12 hr tablet    Commonly known as: MUCINEX    potassium chloride 10 MEQ tablet    Commonly known as: K-DUR,KLOR-CON    spironolactone 25 MG tablet    Commonly known as: ALDACTONE     TAKE these medications       ALPRAZolam 0.25 MG tablet    Commonly known as: XANAX    Take 0.25 mg by mouth every 6 (six) hours as needed for anxiety.    aspirin 325 MG EC tablet    Take 1 tablet (325 mg total) by mouth daily.    budesonide-formoterol 160-4.5 MCG/ACT inhaler    Commonly known as: SYMBICORT    Inhale 2 puffs into the lungs 2 (two) times daily.    calcium-vitamin D 500-200 MG-UNIT per tablet    Commonly known as: OSCAL WITH D    Take 1 tablet by mouth 2 (two) times daily.    docusate sodium 100 MG capsule    Commonly known as: COLACE    Take 100 mg by mouth 2 (two) times daily.    fludrocortisone 0.1 MG tablet    Commonly known as: FLORINEF    Take 1 tablet (0.1 mg total) by mouth daily.    ipratropium-albuterol 0.5-2.5 (3) MG/3ML Soln    Commonly known as: DUONEB    Take 3 mLs by nebulization every 6 (six) hours as needed (for shortness of breath).    Iron 325 (65 FE) MG Tabs      Take 325 mg by mouth 2 (two) times daily.    leuprolide (6 Month) 45 MG injection    Commonly known as: ELIGARD    Inject 45 mg into the skin every 6 (six) months.    montelukast 10 MG tablet    Commonly known as: SINGULAIR    Take 10 mg by mouth at bedtime.    nitroGLYCERIN 0.4 MG SL tablet    Commonly known as: NITROSTAT    Place 0.4 mg under the tongue every 5 (five) minutes as needed for chest pain.    omega-3 acid ethyl esters 1 G capsule    Commonly known as: LOVAZA    Take 2 g by mouth 2 (two) times daily.    omeprazole 40 MG capsule    Commonly known as: PRILOSEC    Take 40 mg by mouth 2 (two) times daily.    rosuvastatin 20 MG tablet    Commonly known as: CRESTOR    Take 20 mg by mouth every evening.    senna 8.6 MG tablet    Commonly known as: SENOKOT    Take 1 tablet by mouth daily.    sertraline 50 MG tablet    Commonly known as: ZOLOFT    Take 50 mg by mouth daily.    tiotropium 18 MCG inhalation capsule    Commonly known as: SPIRIVA    Place 18 mcg into inhaler and inhale daily.    topiramate 25 MG capsule    Commonly  known as: TOPAMAX    Take 25 mg by mouth 2 (two) times daily.    VENTOLIN HFA 108 (90 BASE) MCG/ACT inhaler    Generic drug: albuterol    Inhale 2 puffs into the lungs every 6 (six) hours as needed for wheezing.    vitamin C 1000 MG tablet    Take 1,000 mg by mouth daily.      LABORATORY STUDIES  CBC    Component  Value  Date/Time    WBC  10.8*  04/21/2013 0600    WBC  14.6*  12/30/2011 1642    RBC  4.39  04/21/2013 0600    RBC  4.60  12/30/2011 1642    HGB  12.9*  04/21/2013 0600    HCT  37.6*  04/21/2013 0600    PLT  166  04/21/2013 0600    MCV  85.6  04/21/2013 0600    MCH  29.4  04/21/2013 0600    MCH  29.8  12/30/2011 1642    MCHC  34.3  04/21/2013 0600    MCHC  33.3  12/30/2011 1642    RDW  13.8  04/21/2013 0600    RDW  14.1  12/30/2011 1642    LYMPHSABS  1.4  12/30/2011 1642    EOSABS  0.1  12/30/2011 1642    BASOSABS  0.0  12/30/2011 1642    CMP    Component  Value  Date/Time    NA  141  04/21/2013 0600    NA  139  12/30/2011 1642    K  3.8  04/21/2013 0600    CL  106  04/21/2013 0600    CO2  23  04/21/2013 0600    GLUCOSE  88  04/21/2013 0600    GLUCOSE  115*  12/30/2011 1642    BUN  20  04/21/2013 0600    BUN  25  12/30/2011 1642    CREATININE  1.43*  04/21/2013 0600    CALCIUM    9.4  04/21/2013 0600    PROT  7.3  04/21/2013 0600    ALBUMIN  3.2*  04/21/2013 0600    AST  18  04/21/2013 0600    ALT  21  04/21/2013 0600    ALKPHOS  45  04/21/2013 0600    BILITOT  0.3  04/21/2013 0600    GFRNONAA  46*  04/21/2013 0600    GFRAA  54*  04/21/2013 0600    COAGS  No results found for this basename: INR, PROTIME    Lipid Panel    Component  Value  Date/Time    CHOL  136  04/20/2013 0500    TRIG  163*  04/20/2013 0500    HDL  43  04/20/2013 0500    CHOLHDL  3.2  04/20/2013 0500    VLDL  33  04/20/2013 0500    LDLCALC  60  04/20/2013 0500    HgbA1C  Lab Results   Component  Value  Date    HGBA1C  6.2*  04/20/2013    Cardiac Panel (last 3 results) No results found for this basename: CKTOTAL, CKMB, TROPONINI, RELINDX, in  the last 72 hours  Urinalysis  No results found for this basename: colorurine, appearanceur, labspec, phurine, glucoseu, hgbur, bilirubinur, ketonesur, proteinur, urobilinogen, nitrite, leukocytesur    Urine Drug Screen  No results found for this basename: labopia, cocainscrnur, labbenz, amphetmu, thcu, labbarb    Alcohol Level  No results found for this basename: eth    SIGNIFICANT DIAGNOSTIC STUDIES  Dg Chest Port 1 View  04/20/2013 Hazy opacity at left base most likely due to a combination of volume loss and pleural fluid. Consolidation is not excluded.  CT of the brain  04/21/2013 No acute abnormality.  MRI of the brain No acute infarction. Atrophy and moderate chronic small vessel disease throughout the cerebral hemispheric white matter  MRA of the brain Normal intracranial MR angiography of the large and medium-sized vessels  2D Echocardiogram EF 60%, wall motion normal, left atrium normal  Carotid Doppler Bilateral carotid artery duplex completed: 1-39% ICA stenosis. Vertebral artery flow is antegrade  EKG Atrial flutter from 04/07/2013.  History of Present Illness  Javier W Cozzens Jr. is an 77 y.o. male with a past medical history significant for HTN, CAD s/p CABG and stenting, MI, with history of atrial fibrillation/flutter but had to be taken off anticoagulants due to hematuria on Xarelto 6 months ago. In addition, s/p bilateral CEA, PVD, TIA, PVD, prostate CA s/p brachytherapy.  He was transferred to [facility] NICU after receiving IV tpa treatment at [facility] earlier today.  Patient tells me that the only thing he recalls is waking up in the CT scan machine at that hospital. However, our nursing staff got the verbal report that he was apparently last known well at 1900 on 04/19/2013, when he started having weakness of the left hand and leg, left face droopiness, and slurred speech. He was taken to the ED whre he had NIHSS 16 and after discussion with another neurologist a decision to  treat with IV tpa was made  At [facility], neurology evaluation documented at 0004am on 04/20/2013, the patient said that he is doing much better but still thinks that the left hand was weaker than before. Denied headache, vertigo, double vision, difficulty swallowing, slurred speech, language or vision impairment.  Hospital Course  Mr. Gal W Verbrugge Jr. is a 77 y.o. male presenting with acute delirium, left hemiparesis, left facial droop and slurred speech.   NIHSS at [facility] 15 and IV tPA was started and patient transferred here. Follow up tPA imaging shows no hemorrhagic transformation. . Infarct felt to be embolic secondary to known atrial fib now off anticoagulants due to previous GIB and hematuria. On no antithrombotics prior to admission. Now on Aspirin 325mg daily for secondary stroke prevention.If patient does fail aspirin therapy, recommendations are to swtich to eliqius as he cannot take xarelto (as above). Patient with resultant left hemiparesis.  Coronary artery disease (CABG with stenting)  Hypertension  Atrial fib/flutter  Hematuria on xarelto  Vascular disease: s/p bilateral CEA  History of prostate cancer; s/p brachytherapy  Hyperlipidemia, LDL 60, at goal < 100, continue statin  COPD  Risk factor modification Patient showed significant resolution of his left hemiparesis after admission. MRI brain did not show any acute infarct.. Physical therapy, occupational therapy and speech therapy evaluated patient. They recommend outpatient therapies.  Discharge Exam Blood pressure 126/81, pulse 66, temperature 97.8 F (36.6 C), temperature source Oral, resp. rate 23, height 6' 1" (1.854 m), weight 102.1 kg (225 lb 1.4 oz), SpO2 95.00%.  Physical Exam  Pleasant elderly caucasian male not in distress.Awake alert. Afebrile. Head is nontraumatic. Neck is supple without bruit. Hearing is normal. Cardiac exam no murmur or gallop. Lungs are clear to auscultation. Distal pulses are well felt.  Neurological  Exam ; Awake alert oriented x 3 normal speech and language. Mild left lower face asymmetry. Tongue midline. No drift. Mild diminished fine finger movements on left. Orbits right over left upper extremity. Mild left grip weak.. Normal sensation . Normal coordination  Discharge Diet Cardiac thin liquids  Discharge Plan  Disposition: home  aspirin 325 mg orally every day for secondary stroke prevention.  Ongoing risk factor control by Primary Care Physician. Risk factor recommendations: Hypertension target range 130-140/70-80  Lipid range - LDL < 100 and checked every 6 months, fasting  Diabetes - HgB A1C <7  Follow-up QUINN,TODD S, MD in 1 month.  Follow-up with Dr. Nicolaos Mitrano, Stroke Clinic in 2 months.  I have asked the patient to follow up with cardiology this week since his medications have been adjusted. 50 minutes were spent preparing discharge.  Signed  Lynn D. Brown, PAC, MBA, MHA  [facility]  Pager: [phone]  04/21/2013 3:07 PM  I have personally examined this patient, reviewed pertinent data and developed the plan of care. I agree with above.   Namine Beahm, MD  

## 2013-04-21 NOTE — Evaluation (Signed)
Physical Therapy Evaluation Patient Details Name: Javier [name]. MRN: [id number] DOB: [birth year] Today's Date: 04/21/2013 Time: 3086-5784 PT Time Calculation (min): 25 min  PT Assessment / Plan / Recommendation History of Present Illness  Mr. Javier Mogle. is a 77 y.o. male presenting with acute delirium, left hemiparesis, left facial droop and slurred speech. NIHSS at [facility]  15 and IV tPA was started and patient transferred here.   Clinical Impression  Pt admitted with above. Pt currently with functional limitations due to the deficits listed below (see PT Problem List).  Pt will benefit from skilled PT to increase their independence and safety with mobility to allow discharge to the venue listed below. Pt has had several recent medical issues including hospitalization for PNA and ST-SNF stay.  Endurance and activity tolerance are primary issues.      PT Assessment  Patient needs continued PT services    Follow Up Recommendations  Home health PT (Pt was already being followed by [facility].)    Does the patient have the potential to tolerate intense rehabilitation      Barriers to Discharge        Equipment Recommendations  None recommended by PT    Recommendations for Other Services     Frequency Min 3X/week    Precautions / Restrictions Precautions Precautions: None   Pertinent Vitals/Pain VSS      Mobility  Bed Mobility Bed Mobility: Supine to Sit;Sitting - Scoot to Edge of Bed Supine to Sit: 6: Modified independent (Device/Increase time);HOB elevated Sitting - Scoot to Edge of Bed: 6: Modified independent (Device/Increase time) Transfers Transfers: Sit to Stand;Stand to Sit Sit to Stand: 6: Modified independent (Device/Increase time);With upper extremity assist;With armrests;From bed;From chair/3-in-1;From toilet Stand to Sit: 6: Modified independent (Device/Increase time);With upper extremity assist;With armrests;To chair/3-in-1;To  toilet Ambulation/Gait Ambulation/Gait Assistance: 5: Supervision Ambulation Distance (Feet): 90 Feet Assistive device: Rolling walker Ambulation/Gait Assistance Details: Pt fatigues quickly and required 1 standing rest break. Gait Pattern: Step-through pattern;Decreased stride length Gait velocity: decr    Exercises     PT Diagnosis: Generalized weakness;Difficulty walking  PT Problem List: Decreased strength;Decreased activity tolerance;Decreased mobility PT Treatment Interventions: DME instruction;Gait training;Stair training;Functional mobility training;Therapeutic activities;Therapeutic exercise;Patient/family education;Balance training     PT Goals(Current goals can be found in the care plan section) Acute Rehab PT Goals Patient Stated Goal: Return home PT Goal Formulation: With patient Time For Goal Achievement: 04/28/13 Potential to Achieve Goals: Good  Visit Information  Last PT Received On: 04/21/13 Assistance Needed: +1 History of Present Illness: Mr. Javier Bogard. is a 77 y.o. male presenting with acute delirium, left hemiparesis, left facial droop and slurred speech. NIHSS at [facility]  15 and IV tPA was started and patient transferred here.        Prior Functioning  Home Living Family/patient expects to be discharged to:: Private residence Living Arrangements: Spouse/significant other;Children Available Help at Discharge: Family;Available 24 hours/day Type of Home: House Home Access: Stairs to enter [employer] of Steps: 5 Entrance Stairs-Rails: Right Home Layout: One level Home Equipment: Walker - 2 wheels Prior Function Level of Independence: Independent with assistive device(s) Comments: Amb with rolling walker.  Communication Communication: No difficulties Dominant Hand: Left    Cognition  Cognition Arousal/Alertness: Awake/alert Behavior During Therapy: WFL for tasks assessed/performed Overall Cognitive Status: Within Functional  Limits for tasks assessed    Extremity/Trunk Assessment Upper Extremity Assessment Upper Extremity Assessment: Defer to OT evaluation Lower Extremity Assessment Lower Extremity Assessment:  Generalized weakness   Balance Balance Balance Assessed: Yes Static Standing Balance Static Standing - Balance Support: No upper extremity supported;During functional activity Static Standing - Level of Assistance: 6: Modified independent (Device/Increase time)  End of Session PT - End of Session Equipment Utilized During Treatment: Gait belt Activity Tolerance: Patient limited by fatigue Patient left: in chair;with call bell/phone within reach Nurse Communication: Mobility status  GP     Kathrine Rieves 04/21/2013, 10:12 AM  [employer] PT [phone]

## 2013-04-26 NOTE — Discharge Summary (Signed)
Stroke Discharge Summary   Patient ID: Javier [name]  MRN: [id number]  DOB: 05-07-1936  Date of Admission: 04/19/2013  Date of Discharge: 04/21/2013  Attending Physician: Javier Cheshire, MD, Stroke MD  Consulting Physician(s): None  Patient's PCP: Javier Pike, MD  Discharge Diagnoses: Right brain stroke not visualized on MRI treated with iv TPA with excellent clinical recovery etiology likely cardioembolic given h/o atrial flutter/fibrillation but not on anticoagulation due h/o recent hematuria  Active Problems:  HYPERLIPIDEMIA-MIXED  CAD, ARTERY BYPASS GRAFT  CAROTID ARTERY STENOSIS, WITHOUT INFARCTION  COPD  Hypertension  Atrial fibrillation  Hematuria  H/O prostate cancer  Encounter for long-term (current) use of high-risk medication  BMI: Body mass index is 29.7 kg/(m^2).  Past Medical History   Diagnosis  Date   .  CAD (coronary artery disease)    .  Hypertension    .  Atypical angina    .  PVD (peripheral vascular disease)    .  Prostate cancer    .  Hyperlipidemia    .  COPD (chronic obstructive pulmonary disease)    .  TIA (transient ischemic attack)  07/2010   .  Myocardial infarction  11/2010   .  Anemia     Past Surgical History   Procedure  Laterality  Date   .  Coronary artery bypass graft       6 years ago   .  Carotid endarterectomy       bilateral   .  Cardiac catheterization   2009, 2012     stents placed      Medication List     STOP taking these medications       amiodarone 200 MG tablet    Commonly known as: PACERONE    CENTRUM SILVER ULTRA MENS PO    diltiazem 120 MG 24 hr capsule    Commonly known as: DILACOR XR    furosemide 20 MG tablet    Commonly known as: LASIX    guaiFENesin 600 MG 12 hr tablet    Commonly known as: MUCINEX    potassium chloride 10 MEQ tablet    Commonly known as: K-DUR,KLOR-CON    spironolactone 25 MG tablet    Commonly known as: ALDACTONE     TAKE these medications       ALPRAZolam 0.25 MG tablet    Commonly known as: XANAX    Take 0.25 mg by mouth every 6 (six) hours as needed for anxiety.    aspirin 325 MG EC tablet    Take 1 tablet (325 mg total) by mouth daily.    budesonide-formoterol 160-4.5 MCG/ACT inhaler    Commonly known as: SYMBICORT    Inhale 2 puffs into the lungs 2 (two) times daily.    calcium-vitamin D 500-200 MG-UNIT per tablet    Commonly known as: OSCAL WITH D    Take 1 tablet by mouth 2 (two) times daily.    docusate sodium 100 MG capsule    Commonly known as: COLACE    Take 100 mg by mouth 2 (two) times daily.    fludrocortisone 0.1 MG tablet    Commonly known as: FLORINEF    Take 1 tablet (0.1 mg total) by mouth daily.    ipratropium-albuterol 0.5-2.5 (3) MG/3ML Soln    Commonly known as: DUONEB    Take 3 mLs by nebulization every 6 (six) hours as needed (for shortness of breath).    Iron 325 (65 FE) MG Tabs  Take 325 mg by mouth 2 (two) times daily.    leuprolide (6 Month) 45 MG injection    Commonly known as: ELIGARD    Inject 45 mg into the skin every 6 (six) months.    montelukast 10 MG tablet    Commonly known as: SINGULAIR    Take 10 mg by mouth at bedtime.    nitroGLYCERIN 0.4 MG SL tablet    Commonly known as: NITROSTAT    Place 0.4 mg under the tongue every 5 (five) minutes as needed for chest pain.    omega-3 acid ethyl esters 1 G capsule    Commonly known as: LOVAZA    Take 2 g by mouth 2 (two) times daily.    omeprazole 40 MG capsule    Commonly known as: PRILOSEC    Take 40 mg by mouth 2 (two) times daily.    rosuvastatin 20 MG tablet    Commonly known as: CRESTOR    Take 20 mg by mouth every evening.    senna 8.6 MG tablet    Commonly known as: SENOKOT    Take 1 tablet by mouth daily.    sertraline 50 MG tablet    Commonly known as: ZOLOFT    Take 50 mg by mouth daily.    tiotropium 18 MCG inhalation capsule    Commonly known as: SPIRIVA    Place 18 mcg into inhaler and inhale daily.    topiramate 25 MG capsule    Commonly  known as: TOPAMAX    Take 25 mg by mouth 2 (two) times daily.    VENTOLIN HFA 108 (90 BASE) MCG/ACT inhaler    Generic drug: albuterol    Inhale 2 puffs into the lungs every 6 (six) hours as needed for wheezing.    vitamin C 1000 MG tablet    Take 1,000 mg by mouth daily.      LABORATORY STUDIES  CBC    Component  Value  Date/Time    WBC  10.8*  04/21/2013 0600    WBC  14.6*  12/30/2011 1642    RBC  4.39  04/21/2013 0600    RBC  4.60  12/30/2011 1642    HGB  12.9*  04/21/2013 0600    HCT  37.6*  04/21/2013 0600    PLT  166  04/21/2013 0600    MCV  85.6  04/21/2013 0600    MCH  29.4  04/21/2013 0600    MCH  29.8  12/30/2011 1642    MCHC  34.3  04/21/2013 0600    MCHC  33.3  12/30/2011 1642    RDW  13.8  04/21/2013 0600    RDW  14.1  12/30/2011 1642    LYMPHSABS  1.4  12/30/2011 1642    EOSABS  0.1  12/30/2011 1642    BASOSABS  0.0  12/30/2011 1642    CMP    Component  Value  Date/Time    NA  141  04/21/2013 0600    NA  139  12/30/2011 1642    K  3.8  04/21/2013 0600    CL  106  04/21/2013 0600    CO2  23  04/21/2013 0600    GLUCOSE  88  04/21/2013 0600    GLUCOSE  115*  12/30/2011 1642    BUN  20  04/21/2013 0600    BUN  25  12/30/2011 1642    CREATININE  1.43*  04/21/2013 0600    CALCIUM  9.4  04/21/2013 0600    PROT  7.3  04/21/2013 0600    ALBUMIN  3.2*  04/21/2013 0600    AST  18  04/21/2013 0600    ALT  21  04/21/2013 0600    ALKPHOS  45  04/21/2013 0600    BILITOT  0.3  04/21/2013 0600    GFRNONAA  46*  04/21/2013 0600    GFRAA  54*  04/21/2013 0600    COAGS  No results found for this basename: INR, PROTIME    Lipid Panel    Component  Value  Date/Time    CHOL  136  04/20/2013 0500    TRIG  163*  04/20/2013 0500    HDL  43  04/20/2013 0500    CHOLHDL  3.2  04/20/2013 0500    VLDL  33  04/20/2013 0500    LDLCALC  60  04/20/2013 0500    HgbA1C  Lab Results   Component  Value  Date    HGBA1C  6.2*  04/20/2013    Cardiac Panel (last 3 results) No results found for this basename: CKTOTAL, CKMB, TROPONINI, RELINDX, in  the last 72 hours  Urinalysis  No results found for this basename: colorurine, appearanceur, labspec, phurine, glucoseu, hgbur, bilirubinur, ketonesur, proteinur, urobilinogen, nitrite, leukocytesur    Urine Drug Screen  No results found for this basename: labopia, cocainscrnur, labbenz, amphetmu, thcu, labbarb    Alcohol Level  No results found for this basename: eth    SIGNIFICANT DIAGNOSTIC STUDIES  Dg Chest Port 1 View  04/20/2013 Hazy opacity at left base most likely due to a combination of volume loss and pleural fluid. Consolidation is not excluded.  CT of the brain  04/21/2013 No acute abnormality.  MRI of the brain No acute infarction. Atrophy and moderate chronic small vessel disease throughout the cerebral hemispheric white matter  MRA of the brain Normal intracranial MR angiography of the large and medium-sized vessels  2D Echocardiogram EF 60%, wall motion normal, left atrium normal  Carotid Doppler Bilateral carotid artery duplex completed: 1-39% ICA stenosis. Vertebral artery flow is antegrade  EKG Atrial flutter from 04/07/2013.  History of Present Illness  Javier [name]. is an 77 y.o. male with a past medical history significant for HTN, CAD s/p CABG and stenting, MI, with history of atrial fibrillation/flutter but had to be taken off anticoagulants due to hematuria on Xarelto 6 months ago. In addition, s/p bilateral CEA, PVD, TIA, PVD, prostate CA s/p brachytherapy.  He was transferred to [facility] NICU after receiving IV tpa treatment at [facility] hospital earlier today.  Patient tells me that the only thing he recalls is waking up in the CT scan machine at that hospital. However, our nursing staff got the verbal report that he was apparently last known well at 1900 on 04/19/2013, when he started having weakness of the left hand and leg, left face droopiness, and slurred speech. He was taken to the ED whre he had NIHSS 16 and after discussion with another neurologist a decision to  treat with IV tpa was made  At [facility], neurology evaluation documented at 0004am on 04/20/2013, the patient said that he is doing much better but still thinks that the left hand was weaker than before. Denied headache, vertigo, double vision, difficulty swallowing, slurred speech, language or vision impairment.  Hospital Course  Mr. Arliss Frisina. is a 77 y.o. male presenting with acute delirium, left hemiparesis, left facial droop and slurred speech.  NIHSS at [facility] 15 and IV tPA was started and patient transferred here. Follow up tPA imaging shows no hemorrhagic transformation. . Infarct felt to be embolic secondary to known atrial fib now off anticoagulants due to previous GIB and hematuria. On no antithrombotics prior to admission. Now on Aspirin 325mg  daily for secondary stroke prevention.If patient does fail aspirin therapy, recommendations are to swtich to eliqius as he cannot take xarelto (as above). Patient with resultant left hemiparesis.  Coronary artery disease (CABG with stenting)  Hypertension  Atrial fib/flutter  Hematuria on xarelto  Vascular disease: s/p bilateral CEA  History of prostate cancer; s/p brachytherapy  Hyperlipidemia, LDL 60, at goal < 100, continue statin  COPD  Risk factor modification Patient showed significant resolution of his left hemiparesis after admission. MRI brain did not show any acute infarct.. Physical therapy, occupational therapy and speech therapy evaluated patient. They recommend outpatient therapies.  Discharge Exam Blood pressure 126/81, pulse 66, temperature 97.8 F (36.6 C), temperature source Oral, resp. rate 23, height 6\' 1"  (1.854 m), weight 102.1 kg (225 lb 1.4 oz), SpO2 95.00%.  Physical Exam  Pleasant elderly caucasian male not in distress.Awake alert. Afebrile. Head is nontraumatic. Neck is supple without bruit. Hearing is normal. Cardiac exam no murmur or gallop. Lungs are clear to auscultation. Distal pulses are well felt.  Neurological  Exam ; Awake alert oriented x 3 normal speech and language. Mild left lower face asymmetry. Tongue midline. No drift. Mild diminished fine finger movements on left. Orbits right over left upper extremity. Mild left grip weak.. Normal sensation . Normal coordination  Discharge Diet Cardiac thin liquids  Discharge Plan  Disposition: home  aspirin 325 mg orally every day for secondary stroke prevention.  Ongoing risk factor control by Primary Care Physician. Risk factor recommendations: Hypertension target range 130-140/70-80  Lipid range - LDL < 100 and checked every 6 months, fasting  Diabetes - HgB A1C <7  Follow-up Javier Pike, MD in 1 month.  Follow-up with Dr. Delia Heady, Stroke Clinic in 2 months.  I have asked the patient to follow up with cardiology this week since his medications have been adjusted. 50 minutes were spent preparing discharge.  Signed  Gwendolyn Lima. Manson Passey, [facility], MBA, MHA  [facility]  Pager: [phone]  04/21/2013 3:07 PM  I have personally examined this patient, reviewed pertinent data and developed the plan of care. I agree with above.   Delia Heady, MD

## 2013-04-27 ENCOUNTER — Encounter: Payer: Self-pay | Admitting: Cardiovascular Disease

## 2013-04-27 ENCOUNTER — Ambulatory Visit (INDEPENDENT_AMBULATORY_CARE_PROVIDER_SITE_OTHER): Payer: Medicare Other | Admitting: Cardiovascular Disease

## 2013-04-27 VITALS — BP 120/78 | HR 68 | Ht 73.0 in | Wt 228.8 lb

## 2013-04-27 DIAGNOSIS — I6350 Cerebral infarction due to unspecified occlusion or stenosis of unspecified cerebral artery: Secondary | ICD-10-CM

## 2013-04-27 DIAGNOSIS — I5032 Chronic diastolic (congestive) heart failure: Secondary | ICD-10-CM

## 2013-04-27 DIAGNOSIS — J449 Chronic obstructive pulmonary disease, unspecified: Secondary | ICD-10-CM

## 2013-04-27 DIAGNOSIS — I509 Heart failure, unspecified: Secondary | ICD-10-CM

## 2013-04-27 DIAGNOSIS — I4891 Unspecified atrial fibrillation: Secondary | ICD-10-CM

## 2013-04-27 DIAGNOSIS — I639 Cerebral infarction, unspecified: Secondary | ICD-10-CM | POA: Insufficient documentation

## 2013-04-27 DIAGNOSIS — I25810 Atherosclerosis of coronary artery bypass graft(s) without angina pectoris: Secondary | ICD-10-CM

## 2013-04-27 MED ORDER — WARFARIN SODIUM 3 MG PO TABS: 3.0000 mg | ORAL_TABLET | Freq: Every day | ORAL | Status: DC

## 2013-04-27 MED ORDER — FUROSEMIDE 20 MG PO TABS: 20.0000 mg | ORAL_TABLET | Freq: Every day | ORAL | Status: DC | PRN

## 2013-04-27 MED ORDER — POTASSIUM CHLORIDE ER 10 MEQ PO TBCR: 10.0000 meq | EXTENDED_RELEASE_TABLET | Freq: Every day | ORAL | Status: DC | PRN

## 2013-04-27 MED ORDER — FUROSEMIDE 20 MG PO TABS: 20.0000 mg | ORAL_TABLET | Freq: Every day | ORAL | Status: DC

## 2013-04-27 NOTE — Assessment & Plan Note (Signed)
Long discussion about atrial fibrillation. Family is willing to take the risk of GI bleeding or hematuria and start warfarin. He has never been on warfarin before. We will start 3 mg of warfarin, continue his aspirin for now. Once INR 1.8 or greater, we'll hold his aspirin. Ideally will run INR 1.8 up to 2.2 to minimize risk of bleeding.

## 2013-04-27 NOTE — Assessment & Plan Note (Addendum)
Acute onset of symptoms last week, given TPA, transferred to [facility], resolution of his symptoms

## 2013-04-27 NOTE — Assessment & Plan Note (Signed)
Previous admissions for diastolic CHF. Suggested he take Lasix when necessary only for leg edema or shortness of breath.

## 2013-04-27 NOTE — Progress Notes (Signed)
Patient ID: Javier [name]., male    DOB: 25-Nov-1935, 77 y.o.   MRN: [id number]  HPI Comments: 77 year old male with history of smoking 40 years, severe asymmetric LVH with moderate LVOT gradient,  COPD, coronary artery disease with bypass surgery in 2007, occlusion of 2 vein grafts with PCI x4, peripheral vascular disease with bilateral carotid endarterectomies in 2002, history of TIA in 2002 who haspresented several times to [facility] with COPD exacerbation,  TIA, episode of COPD exacerbation/bronchitis also with episode of shortness of breath and chest pain that took him to [facility] where he had a cardiac catheterization showing patent vessels with no significant progression of his disease, who presents for routine followup . He was previously found to be resistant to Plavix by P2Y12 assay  at [facility]   asymmetric LVH that is severe, diastolic relaxation abnormality, normal systolic function, mild MR significant chordal SAM with LVOT gradient of 3 m/s and gradient of 35 mm of mercury and increases to gradient of 49 with Valsalva (interestingly on followup echocardiogram at [facility] earlier in 2014, no significant gradient was recorded)   Frequent COPD exacerbations, often requiring hospitalization Often needing long courses of antibiotics and steroids   hospitalization in May 2013 chest pain, COPD exacerbation. Stress test showed no ischemia. Echocardiogram was essentially normal with mild MR. received significant IV fluids through his hospital course. Edema got worse through the next week with significant pain in his legs. This improved on outpatient diuretics. Lasix held periodically for low blood pressure Found to be very iron deficient, very anemic. Labs show iron level XVI, hematocrit 24 BNP 400  admission to the hospital 08/31/2012 for shortness of breath. Treated for COPD exacerbation, started on Levaquin IV steroids. CT scan of the chest showing stable pulmonary nodule.   Several recent  hospital admissions for orthostatic hypotension, syncope. Previous admission 02/06/2013 Last hospitalized end of July with discharge July 31 with syncope, ortho cysts static hypotension. Creatinine was 1.54 with BUN 30 on arrival Lasix has been held. He presents today and reports having continued inappropriate urination in the afternoon, dizziness with standing, blood pressure at home 102 systolic. Legs are weak. Denies any lower extremity edema or shortness of breath  seen by hematology, Dr. Neale Burly, suggested that he have additional testing for platelet aggregation.  He was previously not on warfarin secondary to recent GI bleeds, as well as hematuria. In followup today, family presents with him and reports that he had a recent stroke. He had acute onset mental status changes with garbled speech. He was given TPA on route to [facility], additional workup included carotid ultrasound, head CT scan and MRA. He was discharged on aspirin. He reports near complete resolution of his symptoms. Family is very worried about him not being on better anticoagulation. Daughter in particular. We have a long discussion about hematuria, GI bleeding in the past. They're all willing to accept any bleeding risk in an effort to reduce the risk of stroke from his atrial fibrillation.   Prior Carotid ultrasound done in the hospital 01/12/2012 shows no significant carotid disease , repeat study August 2014 EKG shows atrial fibrillation with rate of 68 beats per minute   Outpatient Encounter Prescriptions as of 04/27/2013  Medication Sig Dispense Refill  . albuterol (VENTOLIN HFA) 108 (90 BASE) MCG/ACT inhaler Inhale 2 puffs into the lungs every 6 (six) hours as needed for wheezing.      Marland Kitchen ALPRAZolam (XANAX) 0.25 MG tablet Take 0.25 mg by mouth every  6 (six) hours as needed for anxiety.       . Ascorbic Acid (VITAMIN C) 1000 MG tablet Take 1,000 mg by mouth daily.      Marland Kitchen aspirin EC 325 MG EC tablet Take 1 tablet (325 mg  total) by mouth daily.  30 tablet  0  . budesonide-formoterol (SYMBICORT) 160-4.5 MCG/ACT inhaler Inhale 2 puffs into the lungs 2 (two) times daily.       Marland Kitchen docusate sodium (COLACE) 100 MG capsule Take 100 mg by mouth 2 (two) times daily.      . Ferrous Sulfate (IRON) 325 (65 FE) MG TABS Take 325 mg by mouth 2 (two) times daily.       . fludrocortisone (FLORINEF) 0.1 MG tablet Take 1 tablet (0.1 mg total) by mouth daily.  30 tablet  3  . guaiFENesin-codeine (ROBITUSSIN AC) 100-10 MG/5ML syrup Take 5 mLs by mouth as needed for cough.      Marland Kitchen HYDROcodone-acetaminophen (NORCO/VICODIN) 5-325 MG per tablet Take 1 tablet by mouth as needed for pain.      Marland Kitchen ipratropium-albuterol (DUONEB) 0.5-2.5 (3) MG/3ML SOLN Take 3 mLs by nebulization every 6 (six) hours as needed (for shortness of breath).       Marland Kitchen leuprolide, 6 Month, (ELIGARD) 45 MG injection Inject 45 mg into the skin every 6 (six) months.      . montelukast (SINGULAIR) 10 MG tablet Take 10 mg by mouth at bedtime.       . nitroGLYCERIN (NITROSTAT) 0.4 MG SL tablet Place 0.4 mg under the tongue every 5 (five) minutes as needed for chest pain.      . NON FORMULARY Oxygen 2 liters daily.      Marland Kitchen omega-3 acid ethyl esters (LOVAZA) 1 G capsule Take 2 g by mouth 2 (two) times daily.      Marland Kitchen omeprazole (PRILOSEC) 20 MG capsule Take 20 mg by mouth 2 (two) times daily.      . rosuvastatin (CRESTOR) 20 MG tablet Take 20 mg by mouth every evening.       . senna (SENOKOT) 8.6 MG tablet Take 1 tablet by mouth daily.      . sertraline (ZOLOFT) 50 MG tablet Take 50 mg by mouth daily.      Marland Kitchen tiotropium (SPIRIVA) 18 MCG inhalation capsule Place 18 mcg into inhaler and inhale daily.       Marland Kitchen topiramate (TOPAMAX) 25 MG capsule Take 25 mg by mouth 2 (two) times daily.      . furosemide (LASIX) 20 MG tablet Take 1 tablet (20 mg total) by mouth daily as needed.  30 tablet  3  . potassium chloride (K-DUR) 10 MEQ tablet Take 1 tablet (10 mEq total) by mouth daily as needed.   30 tablet  3  . warfarin (COUMADIN) 3 MG tablet Take 1 tablet (3 mg total) by mouth daily.  30 tablet  1    Review of Systems  Constitutional: Negative.   HENT: Negative.   Eyes: Negative.   Respiratory: Negative.   Cardiovascular: Negative.   Gastrointestinal: Negative.   Musculoskeletal: Negative.   Skin: Negative.   Neurological: Negative.   Psychiatric/Behavioral: Negative.   All other systems reviewed and are negative.    BP 120/78  Pulse 68  Ht 6\' 1"  (1.854 m)  Wt 228 lb 12 oz (103.76 kg)  BMI 30.19 kg/m2  Physical Exam  Nursing note and vitals reviewed. Constitutional: He is oriented to person, place, and time. He appears well-developed  and well-nourished.  HENT:  Head: Normocephalic.  Nose: Nose normal.  Mouth/Throat: Oropharynx is clear and moist.  Eyes: Conjunctivae are normal. Pupils are equal, round, and reactive to light.  Neck: Normal range of motion. Neck supple. No JVD present.  Cardiovascular: Normal rate, S1 normal, S2 normal and intact distal pulses.  An irregularly irregular rhythm present. Exam reveals no gallop and no friction rub.   Murmur heard.  Crescendo systolic murmur is present with a grade of 2/6  Trace pitting above the sock line bilaterally  Pulmonary/Chest: Effort normal. No respiratory distress. He has decreased breath sounds. He has no wheezes. He exhibits no tenderness.  Abdominal: Soft. Bowel sounds are normal. He exhibits no distension. There is no tenderness.  Musculoskeletal: Normal range of motion. He exhibits no edema and no tenderness.  Lymphadenopathy:    He has no cervical adenopathy.  Neurological: He is alert and oriented to person, place, and time. Coordination normal.  Skin: Skin is warm and dry. No rash noted. No erythema.  Psychiatric: He has a normal mood and affect. His behavior is normal. Judgment and thought content normal.      Assessment and Plan

## 2013-04-27 NOTE — Assessment & Plan Note (Signed)
Currently with no symptoms of angina. No further workup at this time. Continue current medication regimen. 

## 2013-04-27 NOTE — Patient Instructions (Addendum)
Please start warfarin 3 mg daily Come in for INR check in one week Continue aspirin 325 mg daily for now  When the INR level is around 1.8 or higher, we will hold the aspirin  Please continue to take lasix as needed for weight gain or leg swelling  Please call us if you have new issues that need to be addressed before your next appt.  Your physician wants you to follow-up in: 1 month.

## 2013-04-27 NOTE — Assessment & Plan Note (Signed)
In the past he has had numerous admissions for COPD exacerbation. None recently

## 2013-05-04 ENCOUNTER — Ambulatory Visit: Payer: Self-pay | Admitting: Internal Medicine

## 2013-05-04 LAB — CANCER CENTER HEMOGLOBIN: HGB: 13.3 g/dL (ref 13.0–18.0)

## 2013-05-05 ENCOUNTER — Ambulatory Visit (INDEPENDENT_AMBULATORY_CARE_PROVIDER_SITE_OTHER): Payer: Medicare Other

## 2013-05-05 DIAGNOSIS — I6350 Cerebral infarction due to unspecified occlusion or stenosis of unspecified cerebral artery: Secondary | ICD-10-CM

## 2013-05-05 DIAGNOSIS — I4891 Unspecified atrial fibrillation: Secondary | ICD-10-CM

## 2013-05-05 DIAGNOSIS — I639 Cerebral infarction, unspecified: Secondary | ICD-10-CM

## 2013-05-05 DIAGNOSIS — Z7901 Long term (current) use of anticoagulants: Secondary | ICD-10-CM

## 2013-05-05 DIAGNOSIS — G459 Transient cerebral ischemic attack, unspecified: Secondary | ICD-10-CM

## 2013-05-05 NOTE — Patient Instructions (Signed)

## 2013-05-06 ENCOUNTER — Observation Stay: Payer: Self-pay | Admitting: Internal Medicine

## 2013-05-06 DIAGNOSIS — R079 Chest pain, unspecified: Secondary | ICD-10-CM

## 2013-05-06 LAB — CBC WITH DIFFERENTIAL/PLATELET
Eosinophil #: 0.1 10*3/uL (ref 0.0–0.7)
Eosinophil %: 1.3 %
HCT: 33.9 % — ABNORMAL LOW (ref 40.0–52.0)
MCH: 29.4 pg (ref 26.0–34.0)
Monocyte #: 0.7 x10 3/mm (ref 0.2–1.0)
Monocyte %: 6.7 %
Neutrophil %: 65.2 %
Platelet: 192 10*3/uL (ref 150–440)
RBC: 3.98 10*6/uL — ABNORMAL LOW (ref 4.40–5.90)
RDW: 14.3 % (ref 11.5–14.5)

## 2013-05-06 LAB — URINALYSIS, COMPLETE
Bacteria: NONE SEEN
Glucose,UR: NEGATIVE mg/dL (ref 0–75)
Hyaline Cast: 5
Leukocyte Esterase: NEGATIVE
Nitrite: NEGATIVE
Ph: 6 (ref 4.5–8.0)
RBC,UR: 2 /HPF (ref 0–5)
Specific Gravity: 1.02 (ref 1.003–1.030)
Squamous Epithelial: <1

## 2013-05-06 LAB — PROTIME-INR
INR: 2.1
Prothrombin Time: 23.5 secs — ABNORMAL HIGH (ref 11.5–14.7)

## 2013-05-06 LAB — CK TOTAL AND CKMB (NOT AT ARMC)
CK, Total: 24 U/L — ABNORMAL LOW (ref 35–232)
CK, Total: 23 U/L — ABNORMAL LOW (ref 35–232)
CK-MB: 0.7 ng/mL (ref 0.5–3.6)

## 2013-05-06 LAB — COMPREHENSIVE METABOLIC PANEL
Albumin: 3 g/dL — ABNORMAL LOW (ref 3.4–5.0)
Alkaline Phosphatase: 56 U/L (ref 50–136)
Anion Gap: 7 (ref 7–16)
BUN: 20 mg/dL — ABNORMAL HIGH (ref 7–18)
Chloride: 110 mmol/L — ABNORMAL HIGH (ref 98–107)
Creatinine: 1.44 mg/dL — ABNORMAL HIGH (ref 0.60–1.30)
EGFR (African American): 55 — ABNORMAL LOW
EGFR (Non-African Amer.): 47 — ABNORMAL LOW
Glucose: 87 mg/dL (ref 65–99)
Osmolality: 280 (ref 275–301)
SGOT(AST): 27 U/L (ref 15–37)

## 2013-05-06 LAB — TROPONIN I: Troponin-I: <0.02 ng/mL

## 2013-05-07 LAB — CBC WITH DIFFERENTIAL/PLATELET
Eosinophil %: 1.6 %
HCT: 34.9 % — ABNORMAL LOW (ref 40.0–52.0)
MCH: 29.2 pg (ref 26.0–34.0)
MCHC: 34.3 g/dL (ref 32.0–36.0)
MCV: 85 fL (ref 80–100)
Monocyte %: 6.6 %
Neutrophil #: 7.7 10*3/uL — ABNORMAL HIGH (ref 1.4–6.5)
Neutrophil %: 73.7 %
Platelet: 193 10*3/uL (ref 150–440)
RDW: 14.4 % (ref 11.5–14.5)
WBC: 10.4 10*3/uL (ref 3.8–10.6)

## 2013-05-07 LAB — BASIC METABOLIC PANEL
BUN: 20 mg/dL — ABNORMAL HIGH (ref 7–18)
Calcium, Total: 8.8 mg/dL (ref 8.5–10.1)
Creatinine: 1.46 mg/dL — ABNORMAL HIGH (ref 0.60–1.30)
EGFR (African American): 54 — ABNORMAL LOW
Osmolality: 280 (ref 275–301)
Sodium: 139 mmol/L (ref 136–145)

## 2013-05-12 ENCOUNTER — Ambulatory Visit (INDEPENDENT_AMBULATORY_CARE_PROVIDER_SITE_OTHER): Payer: Medicare Other

## 2013-05-12 DIAGNOSIS — Z7901 Long term (current) use of anticoagulants: Secondary | ICD-10-CM

## 2013-05-12 DIAGNOSIS — G459 Transient cerebral ischemic attack, unspecified: Secondary | ICD-10-CM

## 2013-05-12 DIAGNOSIS — I6350 Cerebral infarction due to unspecified occlusion or stenosis of unspecified cerebral artery: Secondary | ICD-10-CM

## 2013-05-12 DIAGNOSIS — I639 Cerebral infarction, unspecified: Secondary | ICD-10-CM

## 2013-05-12 DIAGNOSIS — I4891 Unspecified atrial fibrillation: Secondary | ICD-10-CM

## 2013-05-14 ENCOUNTER — Ambulatory Visit: Payer: Medicare Other | Admitting: Cardiovascular Disease

## 2013-05-19 ENCOUNTER — Ambulatory Visit (INDEPENDENT_AMBULATORY_CARE_PROVIDER_SITE_OTHER): Payer: Medicare Other | Admitting: General Practice

## 2013-05-19 ENCOUNTER — Ambulatory Visit: Payer: Self-pay | Admitting: Internal Medicine

## 2013-05-19 DIAGNOSIS — Z7901 Long term (current) use of anticoagulants: Secondary | ICD-10-CM

## 2013-05-19 DIAGNOSIS — I4891 Unspecified atrial fibrillation: Secondary | ICD-10-CM

## 2013-05-19 DIAGNOSIS — G459 Transient cerebral ischemic attack, unspecified: Secondary | ICD-10-CM

## 2013-05-19 DIAGNOSIS — I6350 Cerebral infarction due to unspecified occlusion or stenosis of unspecified cerebral artery: Secondary | ICD-10-CM

## 2013-05-19 DIAGNOSIS — I639 Cerebral infarction, unspecified: Secondary | ICD-10-CM

## 2013-05-23 ENCOUNTER — Emergency Department: Payer: Self-pay | Admitting: Emergency Medicine

## 2013-05-23 ENCOUNTER — Inpatient Hospital Stay (HOSPITAL_COMMUNITY): Payer: Medicare Other

## 2013-05-23 ENCOUNTER — Inpatient Hospital Stay (HOSPITAL_COMMUNITY)
Admission: AD | Admit: 2013-05-23 | Discharge: 2013-05-25 | DRG: 065 | Disposition: A | Discharge status: Home Health | Payer: Medicare Other | Source: Other Acute Inpatient Hospital | Attending: Neurology | Admitting: Neurology

## 2013-05-23 DIAGNOSIS — I5032 Chronic diastolic (congestive) heart failure: Secondary | ICD-10-CM | POA: Diagnosis present

## 2013-05-23 DIAGNOSIS — I10 Essential (primary) hypertension: Secondary | ICD-10-CM

## 2013-05-23 DIAGNOSIS — G459 Transient cerebral ischemic attack, unspecified: Secondary | ICD-10-CM

## 2013-05-23 DIAGNOSIS — Z951 Presence of aortocoronary bypass graft: Secondary | ICD-10-CM

## 2013-05-23 DIAGNOSIS — I509 Heart failure, unspecified: Secondary | ICD-10-CM | POA: Diagnosis present

## 2013-05-23 DIAGNOSIS — Z8546 Personal history of malignant neoplasm of prostate: Secondary | ICD-10-CM

## 2013-05-23 DIAGNOSIS — Z9861 Coronary angioplasty status: Secondary | ICD-10-CM

## 2013-05-23 DIAGNOSIS — Z7982 Long term (current) use of aspirin: Secondary | ICD-10-CM

## 2013-05-23 DIAGNOSIS — I739 Peripheral vascular disease, unspecified: Secondary | ICD-10-CM | POA: Diagnosis present

## 2013-05-23 DIAGNOSIS — Z7901 Long term (current) use of anticoagulants: Secondary | ICD-10-CM

## 2013-05-23 DIAGNOSIS — I6350 Cerebral infarction due to unspecified occlusion or stenosis of unspecified cerebral artery: Principal | ICD-10-CM

## 2013-05-23 DIAGNOSIS — I25810 Atherosclerosis of coronary artery bypass graft(s) without angina pectoris: Secondary | ICD-10-CM

## 2013-05-23 DIAGNOSIS — I4891 Unspecified atrial fibrillation: Secondary | ICD-10-CM

## 2013-05-23 DIAGNOSIS — J4489 Other specified chronic obstructive pulmonary disease: Secondary | ICD-10-CM

## 2013-05-23 DIAGNOSIS — E785 Hyperlipidemia, unspecified: Secondary | ICD-10-CM | POA: Diagnosis present

## 2013-05-23 DIAGNOSIS — I639 Cerebral infarction, unspecified: Secondary | ICD-10-CM

## 2013-05-23 DIAGNOSIS — Z87891 Personal history of nicotine dependence: Secondary | ICD-10-CM

## 2013-05-23 DIAGNOSIS — J449 Chronic obstructive pulmonary disease, unspecified: Secondary | ICD-10-CM

## 2013-05-23 DIAGNOSIS — Z8673 Personal history of transient ischemic attack (TIA), and cerebral infarction without residual deficits: Secondary | ICD-10-CM | POA: Diagnosis present

## 2013-05-23 DIAGNOSIS — I2510 Atherosclerotic heart disease of native coronary artery without angina pectoris: Secondary | ICD-10-CM | POA: Diagnosis present

## 2013-05-23 DIAGNOSIS — Z79899 Other long term (current) drug therapy: Secondary | ICD-10-CM

## 2013-05-23 DIAGNOSIS — I252 Old myocardial infarction: Secondary | ICD-10-CM

## 2013-05-23 HISTORY — DX: Anxiety disorder, unspecified: F41.9

## 2013-05-23 LAB — CBC
Hemoglobin: 12.5 g/dL — ABNORMAL LOW (ref 13.0–17.0)
MCH: 29 pg (ref 26.0–34.0)
MCH: 29.7 pg (ref 26.0–34.0)
Platelets: 163 10*3/uL (ref 150–400)
RBC: 4.63 10*6/uL (ref 4.40–5.90)
RBC: 4.21 MIL/uL — ABNORMAL LOW (ref 4.22–5.81)
RDW: 15.3 % — ABNORMAL HIGH (ref 11.5–14.5)
RDW: 14.6 % (ref 11.5–15.5)
WBC: 9.1 10*3/uL (ref 4.0–10.5)

## 2013-05-23 LAB — COMPREHENSIVE METABOLIC PANEL
Albumin: 3.7 g/dL (ref 3.4–5.0)
Bilirubin,Total: 0.5 mg/dL (ref 0.2–1.0)
Calcium, Total: 9.1 mg/dL (ref 8.5–10.1)
Chloride: 107 mmol/L (ref 98–107)
Co2: 26 mmol/L (ref 21–32)
Creatinine: 1.54 mg/dL — ABNORMAL HIGH (ref 0.60–1.30)
Potassium: 3.4 mmol/L — ABNORMAL LOW (ref 3.5–5.1)
SGOT(AST): 33 U/L (ref 15–37)
Sodium: 139 mmol/L (ref 136–145)
Total Protein: 8.1 g/dL (ref 6.4–8.2)

## 2013-05-23 LAB — BASIC METABOLIC PANEL
Chloride: 104 mEq/L (ref 96–112)
Creatinine, Ser: 1.4 mg/dL — ABNORMAL HIGH (ref 0.50–1.35)
GFR calc Af Amer: 55 mL/min — ABNORMAL LOW (ref >90)
Potassium: 3.5 mEq/L (ref 3.5–5.1)
Sodium: 140 mEq/L (ref 135–145)

## 2013-05-23 LAB — APTT: Activated PTT: 40.6 secs — ABNORMAL HIGH (ref 23.6–35.9)

## 2013-05-23 MED ORDER — WARFARIN - PHARMACIST DOSING INPATIENT: Freq: Every day | Status: DC

## 2013-05-23 MED ORDER — WARFARIN SODIUM 1 MG PO TABS
1.5000 mg | ORAL_TABLET | Freq: Once | ORAL | Status: AC
  Administered 2013-05-23: 18:00:00 1.5 mg via ORAL
  Filled 2013-05-23: qty 1

## 2013-05-23 MED ORDER — INFLUENZA VAC SPLIT QUAD 0.5 ML IM SUSP
0.5000 mL | INTRAMUSCULAR | Status: AC
  Filled 2013-05-23: qty 0.5

## 2013-05-23 MED ORDER — SODIUM CHLORIDE 0.9 % IV SOLN
INTRAVENOUS | Status: DC
  Administered 2013-05-23: 11:00:00 via INTRAVENOUS

## 2013-05-23 MED ORDER — ALPRAZOLAM 0.25 MG PO TABS
0.2500 mg | ORAL_TABLET | Freq: Three times a day (TID) | ORAL | Status: DC | PRN
  Administered 2013-05-23: 0.25 mg via ORAL
  Filled 2013-05-23: qty 1

## 2013-05-23 MED ORDER — ASPIRIN EC 81 MG PO TBEC
81.0000 mg | DELAYED_RELEASE_TABLET | Freq: Every day | ORAL | Status: DC
  Administered 2013-05-23 – 2013-05-25 (×3): 81 mg via ORAL
  Filled 2013-05-23 (×3): qty 1

## 2013-05-23 MED ORDER — SENNOSIDES-DOCUSATE SODIUM 8.6-50 MG PO TABS
1.0000 | ORAL_TABLET | Freq: Every evening | ORAL | Status: DC | PRN
  Filled 2013-05-23: qty 1

## 2013-05-23 NOTE — H&P (Signed)
Admission H&P    Chief Complaint: Acute recurrent left-sided weakness.  HPI: Javier [name]. is an 77 y.o. male with a history of atrial fibrillation on Coumadin, hypertension, peripheral vascular disease, COPD, hyperlipidemia, myocardial infarction, and right CVA 1 month ago, presenting with acute recurrent weakness on the left side as well as transient confusion. Patient was unable to recognize his wife and daughter. He reportedly had similar transient confusion with his previous stroke one month ago. Patient was last known well at 3 AM today. INR was 1.9. CT scan of his head showed no acute intracranial abnormality. Patient was seen at [facility] and subsequently transferred here for further management. He received TPA with his previous stroke one month ago prior to being transferred here for further treatment. Is not a candidate for TPA because of the recent thrombolytic management with TPA, as well as INR greater than 1.8. As well, patient's deficits have started to improve in the emergency room. NIH stroke score was 3 on arrival here.  LSN: 3 AM on 05/23/2013 tPA Given: No: TPA one month ago, INR 1.9, and improving deficits. MRankin: 1  Past Medical History  Diagnosis Date  . CAD (coronary artery disease)   . Hypertension   . Atypical angina   . PVD (peripheral vascular disease)   . Prostate cancer   . Hyperlipidemia   . COPD (chronic obstructive pulmonary disease)   . TIA (transient ischemic attack) 07/2010  . Myocardial infarction 11/2010  . Anemia   . Stroke     Past Surgical History  Procedure Laterality Date  . Coronary artery bypass graft      6 years ago  . Carotid endarterectomy      bilateral  . Cardiac catheterization  2009, 2012    stents placed    Family History  Problem Relation Age of Onset  . Heart disease Mother   . Stomach cancer Father   . Throat cancer Father   . Heart attack Brother    Social History:  reports that he quit  smoking about 5 years ago. He has never used smokeless tobacco. He reports that he does not drink alcohol or use illicit drugs.  Allergies:  Allergies  Allergen Reactions  . Prednisone     Medications Prior to Admission  Medication Sig Dispense Refill  . albuterol (VENTOLIN HFA) 108 (90 BASE) MCG/ACT inhaler Inhale 2 puffs into the lungs every 6 (six) hours as needed for wheezing.      Marland Kitchen ALPRAZolam (XANAX) 0.25 MG tablet Take 0.25 mg by mouth every 6 (six) hours as needed for anxiety.       . Ascorbic Acid (VITAMIN C) 1000 MG tablet Take 1,000 mg by mouth daily.      Marland Kitchen aspirin 81 MG tablet Take 81 mg by mouth daily.      . budesonide-formoterol (SYMBICORT) 160-4.5 MCG/ACT inhaler Inhale 2 puffs into the lungs 2 (two) times daily.       Marland Kitchen docusate sodium (COLACE) 100 MG capsule Take 100 mg by mouth 2 (two) times daily.      . Ferrous Sulfate (IRON) 325 (65 FE) MG TABS Take 325 mg by mouth 2 (two) times daily.       . fludrocortisone (FLORINEF) 0.1 MG tablet Take 1 tablet (0.1 mg total) by mouth daily.  30 tablet  3  . furosemide (LASIX) 20 MG tablet Take 1 tablet (20 mg total) by mouth daily as needed.  30 tablet  3  .  guaiFENesin-codeine (ROBITUSSIN AC) 100-10 MG/5ML syrup Take 5 mLs by mouth as needed for cough.      Marland Kitchen HYDROcodone-acetaminophen (NORCO/VICODIN) 5-325 MG per tablet Take 1 tablet by mouth as needed for pain.      Marland Kitchen ipratropium-albuterol (DUONEB) 0.5-2.5 (3) MG/3ML SOLN Take 3 mLs by nebulization every 6 (six) hours as needed (for shortness of breath).       Marland Kitchen leuprolide, 6 Month, (ELIGARD) 45 MG injection Inject 45 mg into the skin every 6 (six) months.      . montelukast (SINGULAIR) 10 MG tablet Take 10 mg by mouth at bedtime.       . nitroGLYCERIN (NITROSTAT) 0.4 MG SL tablet Place 0.4 mg under the tongue every 5 (five) minutes as needed for chest pain.      . NON FORMULARY Oxygen 2 liters daily.      Marland Kitchen omega-3 acid ethyl esters (LOVAZA) 1 G capsule Take 2 g by mouth 2  (two) times daily.      Marland Kitchen omeprazole (PRILOSEC) 20 MG capsule Take 20 mg by mouth 2 (two) times daily.      . potassium chloride (K-DUR) 10 MEQ tablet Take 1 tablet (10 mEq total) by mouth daily as needed.  30 tablet  3  . rosuvastatin (CRESTOR) 20 MG tablet Take 20 mg by mouth every evening.       . senna (SENOKOT) 8.6 MG tablet Take 1 tablet by mouth daily.      . sertraline (ZOLOFT) 50 MG tablet Take 50 mg by mouth daily.      Marland Kitchen tiotropium (SPIRIVA) 18 MCG inhalation capsule Place 18 mcg into inhaler and inhale daily.       Marland Kitchen topiramate (TOPAMAX) 25 MG capsule Take 25 mg by mouth 2 (two) times daily.      Marland Kitchen warfarin (COUMADIN) 3 MG tablet Take 1 tablet (3 mg total) by mouth daily.  30 tablet  1    ROS: History obtained from the patient  General ROS: negative for - chills, fatigue, fever, night sweats, weight gain or weight loss Psychological ROS: negative for - behavioral disorder, hallucinations, memory difficulties, mood swings or suicidal ideation Ophthalmic ROS: negative for - blurry vision, double vision, eye pain or loss of vision ENT ROS: negative for - epistaxis, nasal discharge, oral lesions, sore throat, tinnitus or vertigo Allergy and Immunology ROS: negative for - hives or itchy/watery eyes Hematological and Lymphatic ROS: negative for - bleeding problems, bruising or swollen lymph nodes Endocrine ROS: negative for - galactorrhea, hair pattern changes, polydipsia/polyuria or temperature intolerance Respiratory ROS: negative for - cough, hemoptysis, shortness of breath or wheezing Cardiovascular ROS: negative for - chest pain, dyspnea on exertion, edema or irregular heartbeat Gastrointestinal ROS: negative for - abdominal pain, diarrhea, hematemesis, nausea/vomiting or stool incontinence Genito-Urinary ROS: negative for - dysuria, hematuria, incontinence or urinary frequency/urgency Musculoskeletal ROS: negative for - joint swelling or muscular weakness Neurological ROS: as  noted in HPI Dermatological ROS: negative for rash and skin lesion changes  Physical Examination: Blood pressure 137/95, pulse 84, temperature 97.7 F (36.5 C), resp. rate 20, height 6\' 1"  (1.854 m), weight 102.604 kg (226 lb 3.2 oz), SpO2 99.00%.  HEENT-  Normocephalic, no lesions, without obvious abnormality.  Normal external eye and conjunctiva.  Normal TM's bilaterally.  Normal auditory canals and external ears. Normal external nose, mucus membranes and septum.  Normal pharynx. Neck supple with no masses, nodes, nodules or enlargement. Cardiovascular - regular rate and rhythm, S1, S2 normal,  no murmur, click, rub or gallop Lungs - chest clear, no wheezing, rales, normal symmetric air entry, Heart exam - S1, S2 normal, no murmur, no gallop, rate regular Abdomen - soft, non-tender; bowel sounds normal; no masses,  no organomegaly Extremities - no joint deformities, effusion, or inflammation and no edema  Neurologic Examination: Mental Status: Alert, oriented, thought content appropriate.  Speech fluent without evidence of aphasia. Able to follow commands without difficulty. Cranial Nerves: II-Visual fields were normal. III/IV/VI-Pupils were equal and reacted. Extraocular movements were full and conjugate.    V/VII-no facial numbness and no facial weakness. VIII-normal. X-normal speech and symmetrical palatal movement. Motor: Mild drift of left upper and lower extremities against gravity; poor effort with voluntary movement of left upper extremity, but able to keep his arm extended with minimal drift for 10 seconds once manually placed in extension; motor findings were otherwise unremarkable Sensory: Reduced perception of tactile stimulation over left extremities compared to right extremities. Deep Tendon Reflexes: Slightly asymmetric with slightly greater responses elicited from left extremities compared to right extremities. Plantars: Flexor bilaterally Cerebellar: Normal finger-to-nose  testing on the right; unable to assess on the left because of poor cooperation. Carotid auscultation: Normal  No results found for this or any previous visit (from the past 48 hour(s)). No results found.  Assessment: 77 y.o. male with atrial fibrillation on Coumadin and aspirin as well as hypertension hyperlipidemia and stroke one month ago, presenting with possible recurrent acute right ischemic cerebral infarction. Confusion at the onset is of unclear etiology.  Stroke Risk Factors - atrial fibrillation, family history, hyperlipidemia and hypertension  Plan: 1. MRI, MRA  of the brain without contrast 2. PT consult, OT consult 3. Prophylactic therapy - Coumadin and aspirin 4. Risk factor modification 5. Telemetry monitoring 6. EEG, routine adult  C.R. Roseanne Reno, MD [facility] [phone]  05/23/2013, 7:35 AM

## 2013-05-23 NOTE — Progress Notes (Signed)
ANTICOAGULATION CONSULT NOTE - Initial Consult  Pharmacy Consult for coumadin  Indication: atrial fibrillation/stroke  Allergies  Allergen Reactions  . Prednisone     Patient Measurements: Height: 6\' 1"  (185.4 cm) Weight: 226 lb 3.2 oz (102.604 kg) IBW/kg (Calculated) : 79.9  Vital Signs: Temp: 97.7 F (36.5 C) (10/05 0716) BP: 137/95 mmHg (10/05 0716) Pulse Rate: 84 (10/05 0716)  Labs: No results found for this basename: HGB, HCT, PLT, APTT, LABPROT, INR, HEPARINUNFRC, CREATININE, CKTOTAL, CKMB, TROPONINI,  in the last 72 hours  Estimated Creatinine Clearance: 56.2 ml/min (by C-G formula based on Cr of 1.43).  Medical History: Past Medical History  Diagnosis Date  . CAD (coronary artery disease)   . Hypertension   . Atypical angina   . PVD (peripheral vascular disease)   . Prostate cancer   . Hyperlipidemia   . COPD (chronic obstructive pulmonary disease)   . TIA (transient ischemic attack) 07/2010  . Myocardial infarction 11/2010  . Anemia   . Stroke     Medications:  Scheduled:  . aspirin EC  81 mg Oral Daily    Assessment: 77 yo M admitted on 10/5 with AMS. Patient is on coumadin at home for atrial fibrillation. PMH includes HTN, myocardial infarction, and right CVA 1 month ago. INR noted to be 1.9 at admission per neurology. Upon discussion with patient, he states he takes 3mg  coumadin on M,W,F and 1.5mg  all other days. He last took a dose (1.5 mg) last night. No bleeding issues noted.  Goal of Therapy:  Patient states his goal INR is 1.8-2.2.   Plan:  -Resume home schedule: coumadin 1.5 mg x1 dose tonight -Daily PT/INR -Monitor bleeding complications  Caylan Chenard C. Honi Name, PharmD Clinical Pharmacist-Resident Pager: [phone] Pharmacy: [phone] 05/23/2013 8:14 AM

## 2013-05-23 NOTE — Progress Notes (Signed)
VASCULAR LAB PRELIMINARY  PRELIMINARY  PRELIMINARY  PRELIMINARY  Patient had normal carotid Dopplers and Echocardiogram 04/20/13 as part of a stroke workup.  Please advise if you would like these studies repeated.  Text paged physician with no response.  Results from previous tests may be found in EPIC under results review.    Derren Suydam, RVT 05/23/2013, 2:39 PM

## 2013-05-23 NOTE — Progress Notes (Addendum)
Stroke Team Progress Note  HISTORY 77 y.o. male with a history of atrial fibrillation on Coumadin, hypertension, peripheral vascular disease, COPD, hyperlipidemia, myocardial infarction, and right CVA 1 month ago, presenting with acute recurrent weakness on the left side as well as transient confusion. Patient was unable to recognize his wife and daughter. He reportedly had similar transient confusion with his previous stroke one month ago. Patient was last known well at 3 AM today. INR was 1.9. CT scan of his head showed no acute intracranial abnormality. Patient was seen at [facility] and subsequently transferred here for further management. He received TPA with his previous stroke one month ago prior to being transferred here for further treatment. Is not a candidate for TPA because of the recent thrombolytic management with TPA, as well as INR greater than 1.8. As well, patient's deficits have started to improve in the emergency room. NIH stroke score was 3 on arrival here.  SUBJECTIVE Pt's symptoms improved. Still has LUE weakness but LLE is close to baseline.   OBJECTIVE Most recent Vital Signs: Filed Vitals:   05/23/13 0716  BP: 137/95  Pulse: 84  Temp: 97.7 F (36.5 C)  Resp: 20  Height: 6\' 1"  (1.854 m)  Weight: 102.604 kg (226 lb 3.2 oz)  SpO2: 99%   CBG (last 3)  No results found for this basename: GLUCAP,  in the last 72 hours  IV Fluid Intake:   . sodium chloride      MEDICATIONS  . aspirin EC  81 mg Oral Daily  . warfarin  1.5 mg Oral ONCE-1800  . Warfarin - Pharmacist Dosing Inpatient   Does not apply q1800   PRN:  senna-docusate  Diet:  Reg diet Activity:   Up with assistance DVT Prophylaxis:  coumadin  CLINICALLY SIGNIFICANT STUDIES Basic Metabolic Panel: No results found for this basename: NA, K, CL, CO2, GLUCOSE, BUN, CREATININE, CALCIUM, MG, PHOS,  in the last 168 hours Liver Function Tests: No results found for this basename: AST, ALT,  ALKPHOS, BILITOT, PROT, ALBUMIN,  in the last 168 hours CBC: No results found for this basename: WBC, NEUTROABS, HGB, HCT, MCV, PLT,  in the last 168 hours Coagulation:  Recent Labs Lab 05/19/13 1345  INR 2.3   Cardiac Enzymes: No results found for this basename: CKTOTAL, CKMB, CKMBINDEX, TROPONINI,  in the last 168 hours Urinalysis: No results found for this basename: COLORURINE, APPERANCEUR, LABSPEC, PHURINE, GLUCOSEU, HGBUR, BILIRUBINUR, KETONESUR, PROTEINUR, UROBILINOGEN, NITRITE, LEUKOCYTESUR,  in the last 168 hours Lipid Panel    Component Value Date/Time   CHOL 136 04/20/2013 0500   TRIG 163* 04/20/2013 0500   HDL 43 04/20/2013 0500   CHOLHDL 3.2 04/20/2013 0500   VLDL 33 04/20/2013 0500   LDLCALC 60 04/20/2013 0500   HgbA1C  Lab Results  Component Value Date   HGBA1C 6.2* 04/20/2013    Urine Drug Screen:   No results found for this basename: labopia, cocainscrnur, labbenz, amphetmu, thcu, labbarb    Alcohol Level: No results found for this basename: ETH,  in the last 168 hours  No results found.  CT of the brain  CT scan of his head showed no acute intracranial abnormality   MRI of the brain    MRA of the brain    2D Echocardiogram    Carotid Doppler    CXR    EKG A-fib  Therapy Recommendations pending  Physical Exam  Neurologic Examination:  Mental Status:  Alert, oriented, thought content appropriate.  Speech fluent without evidence of aphasia. Able to follow commands without difficulty.  Cranial Nerves:  II-Visual fields were normal.  III/IV/VI-Pupils were equal and reacted. Extraocular movements were full and conjugate.  V/VII-no facial numbness and no facial weakness.  VIII-normal.  X-normal speech and symmetrical palatal movement.  Motor: Mild drift of left upper ; poor effort with voluntary movement of left upper extremity, but able to keep his arm extended with minimal drift for 10 seconds once manually placed in extension; motor findings were otherwise  unremarkable  Sensory: Reduced perception of tactile stimulation over left extremities compared to right extremities.  Deep Tendon Reflexes: Slightly asymmetric with slightly greater responses elicited from left extremities compared to right extremities.  Plantars: Flexor bilaterally  Cerebellar: Normal finger-to-nose testing on the right; unable to assess on the left because of poor cooperation.  Carotid auscultation: Normal   ASSESSMENT [name] is a 77 y.o. male y.o. male with atrial fibrillation on Coumadin and aspirin as well as hypertension hyperlipidemia and stroke one month ago, presenting with possible recurrent acute right ischemic cerebral infarction. Confusion at the onset is of unclear etiology. Pt's strength improved specifically LLE, still weak LUE. Appears to have giveaway weakness LUE.    CTH prelim no acute intracranial pathology but do not see it loaded on the system.   Hospital day # 0  TREATMENT/PLAN  Con't coumadin and ASA 81 for stroke prevention  Pt/OT  MRI/MRA pending  Tele  Pt has periods of confusion and hx of old stroke EEG Monday.   CBC/BMP INR now and AM  I have personally obtained a history, examined the patient, evaluated imaging results, and formulated the assessment and plan of care. I agree with the above.

## 2013-05-24 ENCOUNTER — Encounter (HOSPITAL_COMMUNITY): Payer: Self-pay | Admitting: General Practice

## 2013-05-24 ENCOUNTER — Inpatient Hospital Stay (HOSPITAL_COMMUNITY): Payer: Medicare Other

## 2013-05-24 DIAGNOSIS — R4182 Altered mental status, unspecified: Secondary | ICD-10-CM

## 2013-05-24 DIAGNOSIS — R5381 Other malaise: Secondary | ICD-10-CM

## 2013-05-24 LAB — CBC
HCT: 35.1 % — ABNORMAL LOW (ref 39.0–52.0)
Hemoglobin: 11.9 g/dL — ABNORMAL LOW (ref 13.0–17.0)
MCH: 29.3 pg (ref 26.0–34.0)
Platelets: 155 10*3/uL (ref 150–400)
RBC: 4.06 MIL/uL — ABNORMAL LOW (ref 4.22–5.81)
RDW: 14.6 % (ref 11.5–15.5)

## 2013-05-24 LAB — BASIC METABOLIC PANEL
BUN: 16 mg/dL (ref 6–23)
CO2: 24 mEq/L (ref 19–32)
Calcium: 8.8 mg/dL (ref 8.4–10.5)
Chloride: 106 mEq/L (ref 96–112)
Creatinine, Ser: 1.28 mg/dL (ref 0.50–1.35)
GFR calc Af Amer: 61 mL/min — ABNORMAL LOW (ref >90)
GFR calc non Af Amer: 53 mL/min — ABNORMAL LOW (ref >90)
Glucose, Bld: 91 mg/dL (ref 70–99)
Potassium: 3.7 mEq/L (ref 3.5–5.1)
Sodium: 140 mEq/L (ref 135–145)

## 2013-05-24 LAB — PROTIME-INR
INR: 2.21 — ABNORMAL HIGH (ref 0.00–1.49)
Prothrombin Time: 23.8 seconds — ABNORMAL HIGH (ref 11.6–15.2)

## 2013-05-24 LAB — MAGNESIUM: Magnesium: 1.9 mg/dL (ref 1.5–2.5)

## 2013-05-24 MED ORDER — WARFARIN SODIUM 3 MG PO TABS
3.0000 mg | ORAL_TABLET | ORAL | Status: DC
  Administered 2013-05-24: 3 mg via ORAL
  Filled 2013-05-24: qty 1

## 2013-05-24 MED ORDER — WARFARIN SODIUM 1 MG PO TABS
1.5000 mg | ORAL_TABLET | ORAL | Status: DC
  Filled 2013-05-24: qty 1

## 2013-05-24 NOTE — Procedures (Signed)
History: 77 year old male with transient worsening of left-sided weakness  Background: The background consists of intermixed alpha and beta activities. There is a well defined posterior dominant rhythm of 9 Hz that attenuates with eye opening. There is increased delta associated with drowsiness and sleep structures are observed.  Photic stimulation: Physiologic driving is not performed  EEG Abnormalities: None  Clinical Interpretation: This normal EEG is recorded in the waking and sleep state. There was no seizure or seizure predisposition recorded on this study.   Ritta Slot, MD [facility] [phone]  If 7pm- 7am, please page neurology on call at [phone].

## 2013-05-24 NOTE — Progress Notes (Signed)
ANTICOAGULATION CONSULT NOTE - Follow-up Consult  Pharmacy Consult for coumadin  Indication: atrial fibrillation/stroke  Allergies  Allergen Reactions  . Prednisone Other (See Comments)    Pt gets oral thrush if he doesn't swish and swallow while taking it    Patient Measurements: Height: 6\' 1"  (185.4 cm) Weight: 226 lb 3.2 oz (102.604 kg) IBW/kg (Calculated) : 79.9  Vital Signs: Temp: 97.9 F (36.6 C) (10/06 0500) BP: 154/85 mmHg (10/06 0500) Pulse Rate: 69 (10/06 0500)  Labs:  Recent Labs  05/23/13 1132 05/24/13 0530  HGB 12.5* 11.9*  HCT 36.4* 35.1*  PLT 163 155  LABPROT  --  23.8*  INR  --  2.21*  CREATININE 1.40* 1.28    Estimated Creatinine Clearance: 62.8 ml/min (by C-G formula based on Cr of 1.28).  Assessment: 77 yo M on coumadin PTA for afib/CVA. INR 2.21 - therapeutic. Home regimen resumed: 3 mg M, W, F, 1.5 mg AOD. States MD goal for him is currently 1.8-2. No bleeding issues noted.  Goal of Therapy:  Patient states his goal INR is 1.8-2.2.   Plan:  - Resume home warfarin schedule: 3 mg M,W,F and 1.5mg  all other days - Daily INRs  Christoper Fabian, PharmD, BCPS Clinical pharmacist, pager [phone]  05/24/2013 10:29 AM

## 2013-05-24 NOTE — Progress Notes (Signed)
Utilization review completed.  

## 2013-05-24 NOTE — Progress Notes (Signed)
Stroke Team Progress Note  HISTORY 77 y.o. male with a history of atrial fibrillation on Coumadin, hypertension, peripheral vascular disease, COPD, hyperlipidemia, myocardial infarction, and right CVA 1 month ago, presenting with acute recurrent weakness on the left side as well as transient confusion. Patient was unable to recognize his wife and daughter. He reportedly had similar transient confusion with his previous stroke one month ago. Patient was last known well at 3 AM on day of admission. INR was 1.9. CT scan of his head showed no acute intracranial abnormality. Patient was seen at [facility] and subsequently transferred here for further management. He received TPA with his previous stroke one month ago prior to being transferred here for further treatment. Is not a candidate for TPA because of the recent thrombolytic management with TPA, as well as INR greater than 1.8. As well, patient's deficits have started to improve in the emergency room. NIH stroke score was 3 on arrival here.  SUBJECTIVE Pt's symptoms improved.  Minimal LUE weakness and LLE remains close to baseline.   OBJECTIVE Most recent Vital Signs: Filed Vitals:   05/23/13 1800 05/23/13 2100 05/24/13 0000 05/24/13 0500  BP: 145/77 148/98 155/100 154/85  Pulse: 85 78 84 69  Temp: 98 F (36.7 C) 97.6 F (36.4 C) 97.7 F (36.5 C) 97.9 F (36.6 C)  TempSrc:      Resp: 18 18 18 18   Height:      Weight:      SpO2: 97% 98% 100% 99%   CBG (last 3)  No results found for this basename: GLUCAP,  in the last 72 hours  IV Fluid Intake:   . sodium chloride 75 mL/hr at 05/23/13 1108    MEDICATIONS  . aspirin EC  81 mg Oral Daily  . influenza vac split quadrivalent PF  0.5 mL Intramuscular Tomorrow-1000  . [START ON 05/25/2013] warfarin  1.5 mg Oral Q T,Th,S,Su-1800  . warfarin  3 mg Oral Q M,W,F-1800  . Warfarin - Pharmacist Dosing Inpatient   Does not apply q1800   PRN:  ALPRAZolam,  senna-docusate  Diet:  Heart Activity:   Up with assistance DVT Prophylaxis:  coumadin  CLINICALLY SIGNIFICANT STUDIES Basic Metabolic Panel:   Recent Labs Lab 05/23/13 1132 05/24/13 0530  NA 140 140  K 3.5 3.7  CL 104 106  CO2 26 24  GLUCOSE 102* 91  BUN 18 16  CREATININE 1.40* 1.28  CALCIUM 9.2 8.8  MG  --  1.9   Liver Function Tests: No results found for this basename: AST, ALT, ALKPHOS, BILITOT, PROT, ALBUMIN,  in the last 168 hours CBC:   Recent Labs Lab 05/23/13 1132 05/24/13 0530  WBC 9.1 8.3  HGB 12.5* 11.9*  HCT 36.4* 35.1*  MCV 86.5 86.5  PLT 163 155   Coagulation:   Recent Labs Lab 05/19/13 1345 05/24/13 0530  LABPROT  --  23.8*  INR 2.3 2.21*   Cardiac Enzymes: No results found for this basename: CKTOTAL, CKMB, CKMBINDEX, TROPONINI,  in the last 168 hours Urinalysis: No results found for this basename: COLORURINE, APPERANCEUR, LABSPEC, PHURINE, GLUCOSEU, HGBUR, BILIRUBINUR, KETONESUR, PROTEINUR, UROBILINOGEN, NITRITE, LEUKOCYTESUR,  in the last 168 hours Lipid Panel    Component Value Date/Time   CHOL 136 04/20/2013 0500   TRIG 163* 04/20/2013 0500   HDL 43 04/20/2013 0500   CHOLHDL 3.2 04/20/2013 0500   VLDL 33 04/20/2013 0500   LDLCALC 60 04/20/2013 0500   HgbA1C  Lab Results  Component Value  Date   HGBA1C 6.2* 04/20/2013    Urine Drug Screen:   No results found for this basename: labopia,  cocainscrnur,  labbenz,  amphetmu,  thcu,  labbarb    Alcohol Level: No results found for this basename: ETH,  in the last 168 hours  Mr Brain Wo Contrast  05/23/2013   CLINICAL DATA:  Left-sided weakness, now resolved. Stroke risk factors include hypertension, hyperlipidemia, and atrial fibrillation.  Recent admission for right brain stroke not seen on MRI, treated with tPA and resolved. Now being treated with warfarin.  EXAM: MRI HEAD WITHOUT CONTRAST  MRA HEAD WITHOUT CONTRAST  TECHNIQUE: Multiplanar, multiecho pulse sequences of the brain and surrounding  structures were obtained without intravenous contrast. Angiographic images of the head were obtained using MRA technique without contrast.  COMPARISON:  MRI/MRA at [facility], 04/21/2013. Previous CT head 04/20/2013.  FINDINGS: MRI HEAD FINDINGS  Moderate atrophy. Chronic microvascular ischemic change. No large vessel infarct or chronic hemorrhage. Flow voids are maintained. No midline abnormality. Unremarkable osseous structures. Negative orbits, sinuses, mastoids, and extracranial soft tissues. Similar appearance to priors.  MRA HEAD FINDINGS  The internal carotid arteries are widely patent. The basilar artery is widely patent with vertebrals co-dominant. No intracranial stenosis or aneurysm. No change from priors.  IMPRESSION: MRI HEAD IMPRESSION  Chronic changes as described. No acute intracranial findings. No acute stroke is evident. No acute or chronic hemorrhage.  MRA HEAD IMPRESSION  Unremarkable MRA intracranial circulation.   Electronically Signed   By: Davonna Belling M.D.   On: 05/23/2013 11:29   Mr Maxine Glenn Head/brain Wo Cm  05/23/2013   CLINICAL DATA:  Left-sided weakness, now resolved. Stroke risk factors include hypertension, hyperlipidemia, and atrial fibrillation.  Recent admission for right brain stroke not seen on MRI, treated with tPA and resolved. Now being treated with warfarin.  EXAM: MRI HEAD WITHOUT CONTRAST  MRA HEAD WITHOUT CONTRAST  TECHNIQUE: Multiplanar, multiecho pulse sequences of the brain and surrounding structures were obtained without intravenous contrast. Angiographic images of the head were obtained using MRA technique without contrast.  COMPARISON:  MRI/MRA at [facility], 04/21/2013. Previous CT head 04/20/2013.  FINDINGS: MRI HEAD FINDINGS  Moderate atrophy. Chronic microvascular ischemic change. No large vessel infarct or chronic hemorrhage. Flow voids are maintained. No midline abnormality. Unremarkable osseous structures. Negative orbits, sinuses, mastoids, and extracranial soft tissues.  Similar appearance to priors.  MRA HEAD FINDINGS  The internal carotid arteries are widely patent. The basilar artery is widely patent with vertebrals co-dominant. No intracranial stenosis or aneurysm. No change from priors.  IMPRESSION: MRI HEAD IMPRESSION  Chronic changes as described. No acute intracranial findings. No acute stroke is evident. No acute or chronic hemorrhage.  MRA HEAD IMPRESSION  Unremarkable MRA intracranial circulation.   Electronically Signed   By: Davonna Belling M.D.   On: 05/23/2013 11:29    CT of the brain  CT scan of his head showed no acute intracranial abnormality   MRI of the brain  - no acute infarct, chronic microvascular ischemic change  MRA of the brain - b/l ICA patent, basilar artery widely patent, vertebrals co-dom  2D Echocardiogram  - 04/20/13 - EF 60-65%, LA & RA mildly dilated, normal aortic valve with transvalvular velocity wnl  Carotid Doppler - 04/20/13 - b/l 1-39% ICA stenosis, vertebral flow antegrade  CXR  - none this admission   EKG A-fib  Therapy Recommendations PT pending, OT rec outpatient OT 2x/week  Physical Exam  Neurologic Examination:  Mental Status:  Alert, oriented, thought content appropriate. Speech fluent without evidence of aphasia. Able to follow commands without difficulty.  Cranial Nerves:  II-Visual fields were normal.  III/IV/VI-Pupils were equal and reacted. Extraocular movements were full and conjugate.  V/VII-no facial numbness and no facial weakness.  VIII-normal.  X-normal speech and symmetrical palatal movement.  Motor: Mild drift of left upper ; able to keep his arm extended with minimal drift for 10 seconds once manually placed in extension; gross motor findings were otherwise unremarkable, fine motor skills mildly diminished on the left with rapid mvmts Sensory: Reduced perception of tactile stimulation over left extremities compared to right extremities.  Cerebellar: Normal finger-to-nose   ASSESSMENT Mr. Antolin Belsito. is a 77 y.o. male y.o. male with atrial fibrillation on Coumadin and aspirin as well as hypertension hyperlipidemia and stroke one month ago, presenting with TIA. Confusion at the onset is of unclear etiology. Pt's strength improved of both LLE & LUE. MRI without acute infarct.   TIA in setting of h/o CVA  CAD  HTN  PVD  HLD  Hospital day # 1  TREATMENT/PLAN  Con't coumadin and ASA 81 for stroke prevention (MRI does not reveal acute infarct, patient hesitant to try xarelto, has bled in past, has only been on coumadin for 2 weeks and would like to continue)  Follow PT recs  Confusion resolved, pt does not appear in status, no role for EEG at this point  --Attending note to follow  Stacy Gardner, MD (PGY3) I have personally examined this patient, taken history, reviewed pertinent data, developed plan of care and agree with the above Delia Heady, MD

## 2013-05-24 NOTE — Progress Notes (Signed)
PT Cancellation Note  Patient Details Name: Javier [name]. MRN: [id number] DOB: 12-15-1935   Cancelled Treatment:    Reason Eval/Treat Not Completed: Patient at procedure or test/unavailable (pt not currently in room).  PT to check back later as time allows.     Rollene Rotunda Shanquita Ronning, PT, DPT [phone]   05/24/2013, 11:16 AM

## 2013-05-24 NOTE — Evaluation (Signed)
Physical Therapy Evaluation Patient Details Name: Javier [name]. MRN: [id number] DOB: November 28, 1935 Today's Date: 05/24/2013 Time: 1310-1330 PT Time Calculation (min): 20 min  PT Assessment / Plan / Recommendation History of Present Illness  [name] Javier. is an 77 y.o. male with a history of atrial fibrillation on Coumadin, hypertension, peripheral vascular disease, COPD, hyperlipidemia, myocardial infarction, and right CVA 1 month ago, presenting with acute recurrent weakness on the left side as well as transient confusion. Patient was unable to recognize his wife and daughter. He reportedly had similar transient confusion with his previous stroke one month ago. Patient was last known well at 3 AM today. INR was 1.9. CT scan of his head showed no acute intracranial abnormality. Patient was seen at [facility] and subsequently transferred here for further management. He received TPA with his previous stroke one month ago prior to being transferred here for further treatment. Is not a candidate for TPA because of the recent thrombolytic management with TPA, as well as INR greater than 1.8. As well, patient's deficits have started to improve in the emergency room. NIH stroke score was 3 on arrival here.  Clinical Impression  Pt is progressing well with mobility.  He is walking with very little assist and able to demonstrate enough strength to do stairs to enter home with family's assist.  He would benefit from resuming HHPT that was in place PTA.   PT to follow acutely for deficits listed below.      PT Assessment  Patient needs continued PT services    Follow Up Recommendations  Home health PT;Supervision - Intermittent (resume HHPT that was in place PTA with [facility])    Does the patient have the potential to tolerate intense rehabilitation     Yes  Barriers to Discharge   None      Equipment Recommendations  None recommended by PT    Recommendations for Other  Services   None  Frequency Min 4X/week    Precautions / Restrictions   None  Pertinent Vitals/Pain See vitals flow sheet.       Mobility  Bed Mobility Bed Mobility: Not assessed (pt seated in recliner chair ) Transfers Sit to Stand: 6: Modified independent (Device/Increase time);Without upper extremity assist;With armrests;From chair/3-in-1 Stand to Sit: 6: Modified independent (Device/Increase time);Without upper extremity assist;With armrests;To chair/3-in-1 Details for Transfer Assistance: relied on upper extremities to go from sit<-> stand.   Ambulation/Gait Ambulation/Gait Assistance: 5: Supervision Ambulation Distance (Feet): 250 Feet (with two seated rest breaks) Assistive device: Rolling walker Ambulation/Gait Assistance Details: supervision for safety due to pt reports of general fatigue.  Per pt report he has to take multiple rest breaks to get things done at home.   Gait Pattern: Step-through pattern;Shuffle Gait velocity: decreased Stairs: Yes Stairs Assistance: 4: Min assist Stairs Assistance Details (indicate cue type and reason): min assist to stabilize pt for balance and safety on stairs Stair Management Technique: One rail Left;Step to pattern;Forwards;Other (comment) (one rail, one hand held assist) Number of Stairs: 9 Modified Rankin (Stroke Patients Only) Pre-Morbid Rankin Score: Moderate disability Modified Rankin: Moderately severe disability    Exercises Other Exercises Other Exercises: Pt reported exercises that he does daily at home 10 reps when he is tired, 20 reps when he feels good.     PT Diagnosis: Difficulty walking;Abnormality of gait;Generalized weakness;Hemiplegia dominant side  PT Problem List: Decreased strength;Decreased activity tolerance;Decreased balance;Decreased mobility;Obesity PT Treatment Interventions: DME instruction;Gait training;Stair training;Functional mobility training;Therapeutic activities;Therapeutic exercise;Balance  training;Neuromuscular re-education;Patient/family education     PT Goals(Current goals can be found in the care plan section) Acute Rehab PT Goals Patient Stated Goal: to avoid having another stroke PT Goal Formulation: With patient Time For Goal Achievement: 06/07/13 Potential to Achieve Goals: Good  Visit Information  Last PT Received On: 05/24/13 Assistance Needed: +1 Reason Eval/Treat Not Completed: Patient at procedure or test/unavailable (pt not currently in room) History of Present Illness: Javier [name]. is an 77 y.o. male with a history of atrial fibrillation on Coumadin, hypertension, peripheral vascular disease, COPD, hyperlipidemia, myocardial infarction, and right CVA 1 month ago, presenting with acute recurrent weakness on the left side as well as transient confusion. Patient was unable to recognize his wife and daughter. He reportedly had similar transient confusion with his previous stroke one month ago. Patient was last known well at 3 AM today. INR was 1.9. CT scan of his head showed no acute intracranial abnormality. Patient was seen at [facility] and subsequently transferred here for further management. He received TPA with his previous stroke one month ago prior to being transferred here for further treatment. Is not a candidate for TPA because of the recent thrombolytic management with TPA, as well as INR greater than 1.8. As well, patient's deficits have started to improve in the emergency room. NIH stroke score was 3 on arrival here.       Prior Functioning  Home Living Family/patient expects to be discharged to:: Private residence Living Arrangements: Spouse/significant other;Children Available Help at Discharge: Family;Available 24 hours/day Type of Home: House Home Access: Stairs to enter [employer] of Steps: 5 Entrance Stairs-Rails: Right Home Layout: One level Home Equipment: Grab bars - toilet;Walker - 2 wheels;Cane -  single point;Shower seat;Hand held shower head;Grab bars - tub/shower Additional Comments: per pt report he had one more HHPT session with "Trey Paula" from [facility].  He would like to resume HHPT before trying OPPT/OT Prior Function Level of Independence: Independent with assistive device(s) Comments: Ambulates without AD in house and [facility] outside Communication Communication: No difficulties Dominant Hand: Left    Cognition  Cognition Arousal/Alertness: Awake/alert Behavior During Therapy: WFL for tasks assessed/performed Overall Cognitive Status: Within Functional Limits for tasks assessed    Extremity/Trunk Assessment Upper Extremity Assessment Upper Extremity Assessment: Defer to OT evaluation Lower Extremity Assessment Lower Extremity Assessment: Generalized weakness Cervical / Trunk Assessment Cervical / Trunk Assessment: Normal      End of Session PT - End of Session Activity Tolerance: Patient limited by fatigue Patient left: in chair;with call bell/phone within reach    Centrahoma B. Tynesia Harral, PT, DPT [phone]   05/24/2013, 2:30 PM

## 2013-05-24 NOTE — Evaluation (Signed)
Occupational Therapy Evaluation Patient Details Name: Taha Dimond. MRN: [id number] DOB: 04-21-1936 Today's Date: 05/24/2013 Time: 7829-5621 OT Time Calculation (min): 23 min  OT Assessment / Plan / Recommendation History of present illness Dolores Mcgovern. is an 77 y.o. male with a history of atrial fibrillation on Coumadin, hypertension, peripheral vascular disease, COPD, hyperlipidemia, myocardial infarction, and right CVA 1 month ago, presenting with acute recurrent weakness on the left side as well as transient confusion. Patient was unable to recognize his wife and daughter. He reportedly had similar transient confusion with his previous stroke one month ago. Patient was last known well at 3 AM today. INR was 1.9. CT scan of his head showed no acute intracranial abnormality. Patient was seen at [facility] and subsequently transferred here for further management. He received TPA with his previous stroke one month ago prior to being transferred here for further treatment. Is not a candidate for TPA because of the recent thrombolytic management with TPA, as well as INR greater than 1.8. As well, patient's deficits have started to improve in the emergency room. NIH stroke score was 3 on arrival here.   Clinical Impression   This 77 yo male admitted with above presents to acute OT with problems below. Will benefit from acute OT with follow up OPOT. Spoke to neuro PA before getting pt up and letting her know that pt has already been getting up even though he has bedrest orders. She is OK with therapy getting pt up.    OT Assessment  Patient needs continued OT Services    Follow Up Recommendations  Outpatient OT       Equipment Recommendations  None recommended by OT       Frequency  Min 2X/week    Precautions / Restrictions Precautions Precautions: None Restrictions Weight Bearing Restrictions: No       ADL  Transfers/Ambulation Related to ADLs:  Independent in room without AD and not holding onto furniture or feeling that he needs to. ADL Comments: Mod I for all BADLs    OT Diagnosis: Hemiplegia dominant side  OT Problem List: Decreased range of motion;Impaired UE functional use OT Treatment Interventions: Therapeutic exercise;Patient/family education   OT Goals(Current goals can be found in the care plan section) Acute Rehab OT Goals Patient Stated Goal: To find out why this keeps happening OT Goal Formulation: With patient Time For Goal Achievement: 05/31/13 Potential to Achieve Goals: Good  Visit Information  Last OT Received On: 05/24/13 Assistance Needed: +1 History of Present Illness: Juwan Vences. is an 77 y.o. male with a history of atrial fibrillation on Coumadin, hypertension, peripheral vascular disease, COPD, hyperlipidemia, myocardial infarction, and right CVA 1 month ago, presenting with acute recurrent weakness on the left side as well as transient confusion. Patient was unable to recognize his wife and daughter. He reportedly had similar transient confusion with his previous stroke one month ago. Patient was last known well at 3 AM today. INR was 1.9. CT scan of his head showed no acute intracranial abnormality. Patient was seen at [facility] and subsequently transferred here for further management. He received TPA with his previous stroke one month ago prior to being transferred here for further treatment. Is not a candidate for TPA because of the recent thrombolytic management with TPA, as well as INR greater than 1.8. As well, patient's deficits have started to improve in the emergency room. NIH stroke score was 3 on arrival here.  Prior Functioning     Home Living Family/patient expects to be discharged to:: Private residence Living Arrangements: Spouse/significant other;Children Available Help at Discharge: Family;Available 24 hours/day Type of Home: House Home Access: Stairs  to enter [employer] of Steps: 5 Entrance Stairs-Rails: Right Home Layout: One level Home Equipment: Grab bars - toilet;Walker - 2 wheels;Cane - single point;Shower seat;Hand held shower head;Grab bars - tub/shower Prior Function Level of Independence: Independent with assistive device(s) Comments: Ambulates without AD in house and [facility] outside Communication Communication: No difficulties Dominant Hand: Left         Vision/Perception Vision - History Baseline Vision: Wears glasses only for reading Patient Visual Report: No change from baseline Vision - Assessment Vision Assessment: Vision tested Ocular Range of Motion: Within Functional Limits Alignment/Gaze Preference: Within Defined Limits Tracking/Visual Pursuits:  (Pt has trouble maintaining to the left, self corrects) Convergence: Within functional limits Visual Fields: No apparent deficits Additional Comments: Pt does not drive   Cognition  Cognition Arousal/Alertness: Awake/alert Behavior During Therapy: WFL for tasks assessed/performed Overall Cognitive Status: Within Functional Limits for tasks assessed    Extremity/Trunk Assessment Upper Extremity Assessment Upper Extremity Assessment: LUE deficits/detail LUE Deficits / Details: Brunnstrum 4; all movements require increased effort and are tremulous LUE Coordination: decreased fine motor;decreased gross motor     Mobility Bed Mobility Details for Bed Mobility Assistance: Pt up in bathroom upon my arrival Transfers Transfers: Sit to Stand;Stand to Sit Sit to Stand: 7: Independent;Without upper extremity assist;From chair/3-in-1 Stand to Sit: 7: Independent;Without upper extremity assist;To chair/3-in-1     Exercise Other Exercises Other Exercises: Pt reports that he has squeeze ball at home and yellow theraputty (squeeze, flatten, pull, find and pull objects out). Educated pt on using his RUE to A his LUE with AAROM for shoulder flexion in sitting  or supine      End of Session OT - End of Session Activity Tolerance: Patient tolerated treatment well Patient left: in chair;with call bell/phone within reach       [name] [phone] 05/24/2013, 10:00 AM

## 2013-05-24 NOTE — Progress Notes (Signed)
[facility]  Patient Status: Active (receiving services up to time of hospitalization)  [facility] is providing the following services: RN and PT  If patient discharges after hours, please call [phone].   Wynelle Bourgeois 05/24/2013, 5:39 PM

## 2013-05-24 NOTE — Progress Notes (Signed)
Routine EEG completed.  

## 2013-05-24 NOTE — Progress Notes (Signed)
Occupational Therapy Treatment Patient Details Name: Javier [name]. MRN: [id number] DOB: 1935/11/27 Today's Date: 05/24/2013 Time: 1520-1550 OT Time Calculation (min): 30 min  OT Assessment / Plan / Recommendation  History of present illness Javier [name]. is an 77 y.o. male with a history of atrial fibrillation on Coumadin, hypertension, peripheral vascular disease, COPD, hyperlipidemia, myocardial infarction, and right CVA 1 month ago, presenting with acute recurrent weakness on the left side as well as transient confusion. Patient was unable to recognize his wife and daughter. He reportedly had similar transient confusion with his previous stroke one month ago. Patient was last known well at 3 AM today. INR was 1.9. CT scan of his head showed no acute intracranial abnormality. Patient was seen at [facility] and subsequently transferred here for further management. He received TPA with his previous stroke one month ago prior to being transferred here for further treatment. Is not a candidate for TPA because of the recent thrombolytic management with TPA, as well as INR greater than 1.8. As well, patient's deficits have started to improve in the emergency room. NIH stroke score was 3 on arrival here.  MRI was (-) for acute stroke as well.     OT comments  This patient has met all acute OT goals. Will benefit from continued OT at OP. Acute OT will sign off.  Follow Up Recommendations  Outpatient OT    Barriers to Discharge       Equipment Recommendations  None recommended by OT       Frequency Min 2X/week   Progress towards OT Goals Progress towards OT goals: Goals met/education completed, patient discharged from OT  Plan Discharge plan remains appropriate    Precautions / Restrictions Precautions Precautions: None Restrictions Weight Bearing Restrictions: No         OT Goals(current goals can now be found in the care plan section) Acute Rehab OT  Goals Patient Stated Goal: to avoid having another stroke  Visit Information  Last OT Received On: 05/24/13 Assistance Needed: +1 History of Present Illness: Javier [name]. is an 77 y.o. male with a history of atrial fibrillation on Coumadin, hypertension, peripheral vascular disease, COPD, hyperlipidemia, myocardial infarction, and right CVA 1 month ago, presenting with acute recurrent weakness on the left side as well as transient confusion. Patient was unable to recognize his wife and daughter. He reportedly had similar transient confusion with his previous stroke one month ago. Patient was last known well at 3 AM today. INR was 1.9. CT scan of his head showed no acute intracranial abnormality. Patient was seen at [facility] and subsequently transferred here for further management. He received TPA with his previous stroke one month ago prior to being transferred here for further treatment. Is not a candidate for TPA because of the recent thrombolytic management with TPA, as well as INR greater than 1.8. As well, patient's deficits have started to improve in the emergency room. NIH stroke score was 3 on arrival here.  MRI was (-) for acute stroke as well.         Prior Functioning  Home Living Family/patient expects to be discharged to:: Private residence Living Arrangements: Spouse/significant other;Children Available Help at Discharge: Family;Available 24 hours/day Type of Home: House Home Access: Stairs to enter [employer] of Steps: 5 Entrance Stairs-Rails: Right Home Layout: One level Home Equipment: Grab bars - toilet;Walker - 2 wheels;Cane - single point;Shower seat;Hand held shower head;Grab bars -  tub/shower Additional Comments: per pt report he had one more HHPT session with "Trey Paula" from [facility].  He would like to resume HHPT before trying OPPT/OT Prior Function Level of Independence: Independent with assistive device(s) Comments: Ambulates without  AD in house and [facility] outside Communication Communication: No difficulties Dominant Hand: Left    Cognition  Cognition Arousal/Alertness: Awake/alert Behavior During Therapy: WFL for tasks assessed/performed Overall Cognitive Status: Within Functional Limits for tasks assessed       Exercises  Other Exercises Other Exercises: Pt given a handout for LUE FM activities, LUE FM theraputty exercises, and LUE arm exercises.Went over all of these with him and had him return demonstrate the ones he could do here.      End of Session OT - End of Session Activity Tolerance: Patient tolerated treatment well Patient left: in chair;with call bell/phone within reach       [name] [phone] 05/24/2013, 4:10 PM

## 2013-05-25 DIAGNOSIS — G459 Transient cerebral ischemic attack, unspecified: Secondary | ICD-10-CM

## 2013-05-25 DIAGNOSIS — J449 Chronic obstructive pulmonary disease, unspecified: Secondary | ICD-10-CM

## 2013-05-25 DIAGNOSIS — I25810 Atherosclerosis of coronary artery bypass graft(s) without angina pectoris: Secondary | ICD-10-CM

## 2013-05-25 LAB — PROTIME-INR
INR: 1.79 — ABNORMAL HIGH (ref 0.00–1.49)
Prothrombin Time: 20.3 seconds — ABNORMAL HIGH (ref 11.6–15.2)

## 2013-05-25 MED ORDER — INFLUENZA VAC SPLIT QUAD 0.5 ML IM SUSP: 0.5000 mL | INTRAMUSCULAR | Status: DC

## 2013-05-25 MED ORDER — INFLUENZA VAC SPLIT QUAD 0.5 ML IM SUSP
0.5000 mL | INTRAMUSCULAR | Status: AC
  Administered 2013-05-25: 0.5 mL via INTRAMUSCULAR

## 2013-05-25 NOTE — Progress Notes (Signed)
ANTICOAGULATION CONSULT NOTE - Follow-up Consult  Pharmacy Consult for coumadin  Indication: atrial fibrillation/stroke  Allergies  Allergen Reactions  . Prednisone Other (See Comments)    Pt gets oral thrush if he doesn't swish and swallow while taking it    Patient Measurements: Height: 6\' 1"  (185.4 cm) Weight: 226 lb 3.2 oz (102.604 kg) IBW/kg (Calculated) : 79.9  Vital Signs: Temp: 97.7 F (36.5 C) (10/07 0427) Temp src: Oral (10/06 2356) BP: 163/91 mmHg (10/07 0427) Pulse Rate: 76 (10/07 0427)  Labs:  Recent Labs  05/23/13 1132 05/24/13 0530 05/25/13 0437  HGB 12.5* 11.9*  --   HCT 36.4* 35.1*  --   PLT 163 155  --   LABPROT  --  23.8* 20.3*  INR  --  2.21* 1.79*  CREATININE 1.40* 1.28  --     Estimated Creatinine Clearance: 62.8 ml/min (by C-G formula based on Cr of 1.28).  Assessment: 77 yo M on coumadin PTA for afib/CVA. INR 1.79 - slightly therapeutic. Home regimen resumed: 3 mg M, W, F, 1.5 mg other days. States MD goal for him is currently 1.8-2.2 No bleeding issues noted.  Goal of Therapy:  Patient states his goal INR is 1.8-2.2.   Plan:  - Continue home warfarin schedule: 3 mg M,W,F and 1.5mg  all other days - Daily INRs  Christoper Fabian, PharmD, BCPS Clinical pharmacist, pager [phone]  05/25/2013 9:46 AM

## 2013-05-25 NOTE — Progress Notes (Signed)
Discharge instructions given.  No questions asked.  Left via wheelchair to family waiting. Javier [name]

## 2013-05-25 NOTE — Progress Notes (Signed)
Stroke Team Progress Note  HISTORY 77 y.o. male with a history of atrial fibrillation on Coumadin, hypertension, peripheral vascular disease, COPD, hyperlipidemia, myocardial infarction, and right CVA 1 month ago, presenting with acute recurrent weakness on the left side as well as transient confusion. Patient was unable to recognize his wife and daughter. He reportedly had similar transient confusion with his previous stroke one month ago. Patient was last known well at 3 AM on day of admission. INR was 1.9. CT scan of his head showed no acute intracranial abnormality. Patient was seen at [facility] and subsequently transferred here for further management. He received TPA with his previous stroke one month ago prior to being transferred here for further treatment. Is not a candidate for TPA because of the recent thrombolytic management with TPA, as well as INR greater than 1.8. As well, patient's deficits have started to improve in the emergency room. NIH stroke score was 3 on arrival here.  SUBJECTIVE No acute events overnight. Patient feels well, ready to go home.  OBJECTIVE Most recent Vital Signs: Filed Vitals:   05/24/13 1400 05/24/13 1959 05/24/13 2356 05/25/13 0427  BP: 163/80 142/87 156/79 163/91  Pulse: 81 82 70 76  Temp: 98.5 F (36.9 C) 98 F (36.7 C) 98.1 F (36.7 C) 97.7 F (36.5 C)  TempSrc: Oral Oral Oral   Resp: 20 18  20   Height:      Weight:      SpO2: 99% 98% 99% 100%   CBG (last 3)  No results found for this basename: GLUCAP,  in the last 72 hours  IV Fluid Intake:   . sodium chloride 75 mL/hr at 05/23/13 1108    MEDICATIONS  . aspirin EC  81 mg Oral Daily  . influenza vac split quadrivalent PF  0.5 mL Intramuscular Tomorrow-1000  . warfarin  1.5 mg Oral Q T,Th,S,Su-1800  . warfarin  3 mg Oral Q M,W,F-1800  . Warfarin - Pharmacist Dosing Inpatient   Does not apply q1800   PRN:  ALPRAZolam, senna-docusate  Diet:  Heart Activity:   Up  with assistance DVT Prophylaxis:  coumadin  CLINICALLY SIGNIFICANT STUDIES Basic Metabolic Panel:   Recent Labs Lab 05/23/13 1132 05/24/13 0530  NA 140 140  K 3.5 3.7  CL 104 106  CO2 26 24  GLUCOSE 102* 91  BUN 18 16  CREATININE 1.40* 1.28  CALCIUM 9.2 8.8  MG  --  1.9   Liver Function Tests: No results found for this basename: AST, ALT, ALKPHOS, BILITOT, PROT, ALBUMIN,  in the last 168 hours CBC:   Recent Labs Lab 05/23/13 1132 05/24/13 0530  WBC 9.1 8.3  HGB 12.5* 11.9*  HCT 36.4* 35.1*  MCV 86.5 86.5  PLT 163 155   Coagulation:   Recent Labs Lab 05/24/13 0530 05/25/13 0437  LABPROT 23.8* 20.3*  INR 2.21* 1.79*   Cardiac Enzymes: No results found for this basename: CKTOTAL, CKMB, CKMBINDEX, TROPONINI,  in the last 168 hours Urinalysis: No results found for this basename: COLORURINE, APPERANCEUR, LABSPEC, PHURINE, GLUCOSEU, HGBUR, BILIRUBINUR, KETONESUR, PROTEINUR, UROBILINOGEN, NITRITE, LEUKOCYTESUR,  in the last 168 hours Lipid Panel    Component Value Date/Time   CHOL 136 04/20/2013 0500   TRIG 163* 04/20/2013 0500   HDL 43 04/20/2013 0500   CHOLHDL 3.2 04/20/2013 0500   VLDL 33 04/20/2013 0500   LDLCALC 60 04/20/2013 0500   HgbA1C  Lab Results  Component Value Date   HGBA1C 6.2* 04/20/2013  Urine Drug Screen:   No results found for this basename: labopia,  cocainscrnur,  labbenz,  amphetmu,  thcu,  labbarb    Alcohol Level: No results found for this basename: ETH,  in the last 168 hours  Mr Brain Wo Contrast  05/23/2013   CLINICAL DATA:  Left-sided weakness, now resolved. Stroke risk factors include hypertension, hyperlipidemia, and atrial fibrillation.  Recent admission for right brain stroke not seen on MRI, treated with tPA and resolved. Now being treated with warfarin.  EXAM: MRI HEAD WITHOUT CONTRAST  MRA HEAD WITHOUT CONTRAST  TECHNIQUE: Multiplanar, multiecho pulse sequences of the brain and surrounding structures were obtained without intravenous  contrast. Angiographic images of the head were obtained using MRA technique without contrast.  COMPARISON:  MRI/MRA at [facility], 04/21/2013. Previous CT head 04/20/2013.  FINDINGS: MRI HEAD FINDINGS  Moderate atrophy. Chronic microvascular ischemic change. No large vessel infarct or chronic hemorrhage. Flow voids are maintained. No midline abnormality. Unremarkable osseous structures. Negative orbits, sinuses, mastoids, and extracranial soft tissues. Similar appearance to priors.  MRA HEAD FINDINGS  The internal carotid arteries are widely patent. The basilar artery is widely patent with vertebrals co-dominant. No intracranial stenosis or aneurysm. No change from priors.  IMPRESSION: MRI HEAD IMPRESSION  Chronic changes as described. No acute intracranial findings. No acute stroke is evident. No acute or chronic hemorrhage.  MRA HEAD IMPRESSION  Unremarkable MRA intracranial circulation.   Electronically Signed   By: Davonna Belling M.D.   On: 05/23/2013 11:29   Mr Maxine Glenn Head/brain Wo Cm  05/23/2013   CLINICAL DATA:  Left-sided weakness, now resolved. Stroke risk factors include hypertension, hyperlipidemia, and atrial fibrillation.  Recent admission for right brain stroke not seen on MRI, treated with tPA and resolved. Now being treated with warfarin.  EXAM: MRI HEAD WITHOUT CONTRAST  MRA HEAD WITHOUT CONTRAST  TECHNIQUE: Multiplanar, multiecho pulse sequences of the brain and surrounding structures were obtained without intravenous contrast. Angiographic images of the head were obtained using MRA technique without contrast.  COMPARISON:  MRI/MRA at [facility], 04/21/2013. Previous CT head 04/20/2013.  FINDINGS: MRI HEAD FINDINGS  Moderate atrophy. Chronic microvascular ischemic change. No large vessel infarct or chronic hemorrhage. Flow voids are maintained. No midline abnormality. Unremarkable osseous structures. Negative orbits, sinuses, mastoids, and extracranial soft tissues. Similar appearance to priors.  MRA HEAD FINDINGS   The internal carotid arteries are widely patent. The basilar artery is widely patent with vertebrals co-dominant. No intracranial stenosis or aneurysm. No change from priors.  IMPRESSION: MRI HEAD IMPRESSION  Chronic changes as described. No acute intracranial findings. No acute stroke is evident. No acute or chronic hemorrhage.  MRA HEAD IMPRESSION  Unremarkable MRA intracranial circulation.   Electronically Signed   By: Davonna Belling M.D.   On: 05/23/2013 11:29    CT of the brain  CT scan of his head showed no acute intracranial abnormality   MRI of the brain  - no acute infarct, chronic microvascular ischemic change  MRA of the brain - b/l ICA patent, basilar artery widely patent, vertebrals co-dom  2D Echocardiogram  - 04/20/13 - EF 60-65%, LA & RA mildly dilated, normal aortic valve with transvalvular velocity wnl  Carotid Doppler - 04/20/13 - b/l 1-39% ICA stenosis, vertebral flow antegrade  CXR  - none this admission   EKG A-fib  EEG: Normal EEG in waking & sleep state, no seizure or seizure predisposition recorded  Therapy Recommendations PT pending, OT rec outpatient OT 2x/week  Physical Exam  Neurologic Examination:  Mental Status:  Alert, oriented, thought content appropriate. Speech fluent without evidence of aphasia. Able to follow commands without difficulty.  Cranial Nerves:  II-Visual fields were normal.  III/IV/VI-Pupils were equal and reacted. Extraocular movements were full and conjugate.  V/VII-no facial numbness and no facial weakness.  VIII-normal.  X-normal speech and symmetrical palatal movement.  Motor: Motor strength 5/5 bl UE & LE, fine motor skills mildly diminished on the left with rapid mvmts Sensory: grossly intact Cerebellar: Normal finger-to-nose   ASSESSMENT Mr. Creed Kail. is a 77 y.o. male y.o. male with atrial fibrillation on Coumadin and aspirin as well as hypertension hyperlipidemia and stroke one month ago, presenting with TIA.  Confusion at the onset is of unclear etiology. Pt's strength improved of both LLE & LUE. MRI without acute infarct.  Yesterday, patient underwent EEG, which was wnl, and PT eval - rec continuing HHPT already in place with [facility].    TIA in setting of h/o CVA  CAD  HTN  PVD  HLD  Hospital day # 2  TREATMENT/PLAN  Continue coumadin (will cont home regimen of 3mg  MWF and 1.5mg  on remaining days - discussed with pharmacy) and ASA 81 for stroke prevention (MRI does not reveal acute infarct, patient hesitant to try xarelto, has bled in past, has only been on coumadin for 2 weeks and would like to continue)  Consult CM to resume [facility] services   Discharge home today - pt to f/u with cardiology on 05/27/13   Stacy Gardner, MD (PGY3)  I have personally examined this patient, taken history, reviewed pertinent data, developed plan of care and agree with the above. Delia Heady, MD

## 2013-05-25 NOTE — Discharge Summary (Signed)
DISCHARGE SUMMARY  Javier [name].  MR#: [id number]  DOB:1936-06-20  Date of Admission: 05/23/2013 Date of Discharge: 05/25/2013  Attending Physician: Dr. Delia Heady Patient's WUJ:WJXBJ,YNWG S, MD  Consults: Neurology  Discharge Diagnoses: Right brain TIA  Present on Admission:  . HYPERLIPIDEMIA-MIXED . CAD, ARTERY BYPASS GRAFT . TIA . Hypertension . Atrial fibrillation . Chronic diastolic CHF (congestive heart failure) . CVA (cerebral vascular accident)   Chief Complaint: Acute recurrent left-sided weakness.  HPI: Javier [name]. is an 77 y.o. male with a history of atrial fibrillation on Coumadin, hypertension, peripheral vascular disease, COPD, hyperlipidemia, myocardial infarction, and right CVA 1 month ago, presenting with acute recurrent weakness on the left side as well as transient confusion. Patient was unable to recognize his wife and daughter. He reportedly had similar transient confusion with his previous stroke one month ago. Patient was last known well at 3 AM today. INR was 1.9. CT scan of his head showed no acute intracranial abnormality. Patient was seen at [facility] and subsequently transferred here for further management. He received TPA with his previous stroke one month ago prior to being transferred here for further treatment. Is not a candidate for TPA because of the recent thrombolytic management with TPA, as well as INR greater than 1.8. As well, patient's deficits have started to improve in the emergency room. NIH stroke score was 3 on arrival here.  LSN: 3 AM on 05/23/2013  tPA Given: No: TPA one month ago, INR 1.9, and improving deficits.  MRankin: 1   Hospital Course: Patient Active Problem List   Diagnosis Date Noted  . Long term (current) use of anticoagulants 05/05/2013  . CVA (cerebral vascular accident) 04/27/2013  . Hematuria 04/21/2013  . H/O prostate cancer 04/21/2013  . Encounter for long-term (current) use of  high-risk medication 04/21/2013  . Voiding dysfunction 04/07/2013  . Orthostatic hypotension 03/24/2013  . Chronic diastolic CHF (congestive heart failure) 03/24/2013  . Atrial fibrillation 01/07/2013  . Premature ventricular contractions 12/22/2012  . Preop cardiovascular exam 11/06/2012  . Anemia 09/28/2012  . Hypertension 02/14/2012  . Edema 01/23/2012  . Weakness 01/23/2012  . Sweating 12/30/2011  . TIA 08/09/2010  . HYPERLIPIDEMIA-MIXED 05/28/2010  . CAD, ARTERY BYPASS GRAFT 05/28/2010  . CAROTID ARTERY STENOSIS, WITHOUT INFARCTION 05/28/2010  . COPD 05/28/2010   Javier [name]. is a 77 y.o. male y.o. male with atrial fibrillation on Coumadin and aspirin as well as hypertension hyperlipidemia and stroke one month ago, presenting with TIA (details noted above). Confusion at the onset is of unclear etiology, but EEG was normal, and mental status had cleared by day prior to discharge. Patient's strength was significantly improved by day prior to discharge, and back to baseline on day of discharge (both LLE & LUE). MRI without acute infarct. Carotid dopplers & echo were not repeated as patient underwent recent workup on 04/20/13 after acute CVA.  Patient was continued on coumadin (home regimen not changed, discussed with pharmacy, INR at discharge 1.76) and ASA for stroke prevention (MRI does not reveal acute infarct, patient hesitant to try xarelto, has bled in past, has only been on coumadin for 2 weeks and would like to continue).  PT rec re-initiation of home health PT already in place by [facility] (CM consult placed for this).      Medication List         ALPRAZolam 0.25 MG tablet  Commonly known as:  XANAX  Take 0.25 mg by  mouth every 6 (six) hours as needed for anxiety.     aspirin EC 81 MG tablet  Take 81 mg by mouth daily.     budesonide-formoterol 160-4.5 MCG/ACT inhaler  Commonly known as:  SYMBICORT  Inhale 2 puffs into the lungs 2 (two) times daily.     docusate  sodium 100 MG capsule  Commonly known as:  COLACE  Take 100 mg by mouth 2 (two) times daily.     fludrocortisone 0.1 MG tablet  Commonly known as:  FLORINEF  Take 1 tablet (0.1 mg total) by mouth daily.     furosemide 20 MG tablet  Commonly known as:  LASIX  Take 20 mg by mouth daily as needed for fluid or edema.     ipratropium-albuterol 0.5-2.5 (3) MG/3ML Soln  Commonly known as:  DUONEB  Take 3 mLs by nebulization every 6 (six) hours as needed (for shortness of breath).     Iron 325 (65 FE) MG Tabs  Take 325 mg by mouth 2 (two) times daily.     leuprolide (6 Month) 45 MG injection  Commonly known as:  ELIGARD  Inject 45 mg into the skin every 6 (six) months.     montelukast 10 MG tablet  Commonly known as:  SINGULAIR  Take 10 mg by mouth at bedtime.     nitroGLYCERIN 0.4 MG SL tablet  Commonly known as:  NITROSTAT  Place 0.4 mg under the tongue every 5 (five) minutes as needed for chest pain.     NON FORMULARY  Oxygen 2 liters daily.     omega-3 acid ethyl esters 1 G capsule  Commonly known as:  LOVAZA  Take 2 g by mouth 2 (two) times daily.     omeprazole 20 MG capsule  Commonly known as:  PRILOSEC  Take 20 mg by mouth 2 (two) times daily.     potassium chloride 10 MEQ tablet  Commonly known as:  K-DUR  Take 10 mEq by mouth daily as needed (takes with lasix).     rosuvastatin 20 MG tablet  Commonly known as:  CRESTOR  Take 20 mg by mouth every evening.     senna 8.6 MG tablet  Commonly known as:  SENOKOT  Take 1 tablet by mouth daily.     sertraline 50 MG tablet  Commonly known as:  ZOLOFT  Take 50 mg by mouth daily.     tiotropium 18 MCG inhalation capsule  Commonly known as:  SPIRIVA  Place 18 mcg into inhaler and inhale daily.     topiramate 25 MG capsule  Commonly known as:  TOPAMAX  Take 25 mg by mouth 2 (two) times daily.     VENTOLIN HFA 108 (90 BASE) MCG/ACT inhaler  Generic drug:  albuterol  Inhale 2 puffs into the lungs every 6 (six)  hours as needed for wheezing.     vitamin C 1000 MG tablet  Take 1,000 mg by mouth daily.     warfarin 3 MG tablet  Commonly known as:  COUMADIN  Take 1.5-3 mg by mouth daily. 3mg  Monday, Wednesday, Friday; 1.5mg  the rest of the week         Day of Discharge BP 165/74  Pulse 92  Temp(Src) 97.6 F (36.4 C) (Oral)  Resp 20  Ht 6\' 1"  (1.854 m)  Wt 226 lb 3.2 oz (102.604 kg)  BMI 29.85 kg/m2  SpO2 98%  Physical Exam: Neurologic Examination:  Mental Status:  Alert, oriented, thought content appropriate. Speech  fluent without evidence of aphasia. Able to follow commands without difficulty.  Cranial Nerves:  II-Visual fields were normal.  III/IV/VI-Pupils were equal and reacted. Extraocular movements were full and conjugate.  V/VII-no facial numbness and no facial weakness.  VIII-normal.  X-normal speech and symmetrical palatal movement.  Motor: Motor strength 5/5 bl UE & LE, fine motor skills mildly diminished on the left with rapid mvmts  Sensory: grossly intact  Cerebellar: Normal finger-to-nose   Results for orders placed during the hospital encounter of 05/23/13 (from the past 24 hour(s))  PROTIME-INR     Status: Abnormal   Collection Time    05/25/13  4:37 AM      Result Value Range   Prothrombin Time 20.3 (*) 11.6 - 15.2 seconds   INR 1.79 (*) 0.00 - 1.49    Disposition: Stable   Follow-up Appts:     Discharge Orders   Future Appointments Provider Department Dept Phone   05/27/2013 2:00 PM Antonieta Iba, MD [facility] [place] [phone]   05/27/2013 2:15 PM [facility] [place] [phone]   Future Orders Complete By Expires   Call MD for:  difficulty breathing, headache or visual disturbances  As directed    Call MD for:  persistant dizziness or light-headedness  As directed    Diet - low sodium heart healthy  As directed    Discharge instructions  As directed    Comments:     Please follow up with your cardiologist  as scheduled on Thursday 05/27/13 for your INR (coumadin) check   Increase activity slowly  As directed       Follow-up Information   Follow up with Julien Nordmann, MD On 05/27/2013.   Specialty:  Cardiology   Contact information:   [facility] [address] [place] [address] [phone]       Tests Needing Follow-up: INR  Time spent in discharge (includes decision making & examination of pt): 35 minutes  Signed: Stacy Gardner 05/25/2013, 1:17 PM   I have personally examined this patient, taken history, reviewed pertinent data, developed plan of care and agree with the above. Delia Heady, MD

## 2013-05-27 ENCOUNTER — Ambulatory Visit: Payer: Medicare Other | Admitting: Cardiovascular Disease

## 2013-05-28 ENCOUNTER — Ambulatory Visit (INDEPENDENT_AMBULATORY_CARE_PROVIDER_SITE_OTHER): Payer: Medicare Other | Admitting: Cardiovascular Disease

## 2013-05-28 ENCOUNTER — Encounter: Payer: Self-pay | Admitting: Cardiovascular Disease

## 2013-05-28 ENCOUNTER — Ambulatory Visit (INDEPENDENT_AMBULATORY_CARE_PROVIDER_SITE_OTHER): Payer: Medicare Other | Admitting: General Practice

## 2013-05-28 VITALS — BP 138/84 | HR 79 | Ht 73.0 in | Wt 231.5 lb

## 2013-05-28 DIAGNOSIS — J4489 Other specified chronic obstructive pulmonary disease: Secondary | ICD-10-CM

## 2013-05-28 DIAGNOSIS — G459 Transient cerebral ischemic attack, unspecified: Secondary | ICD-10-CM

## 2013-05-28 DIAGNOSIS — E785 Hyperlipidemia, unspecified: Secondary | ICD-10-CM

## 2013-05-28 DIAGNOSIS — I25810 Atherosclerosis of coronary artery bypass graft(s) without angina pectoris: Secondary | ICD-10-CM

## 2013-05-28 DIAGNOSIS — I4949 Other premature depolarization: Secondary | ICD-10-CM

## 2013-05-28 DIAGNOSIS — I951 Orthostatic hypotension: Secondary | ICD-10-CM

## 2013-05-28 DIAGNOSIS — Z7901 Long term (current) use of anticoagulants: Secondary | ICD-10-CM

## 2013-05-28 DIAGNOSIS — I639 Cerebral infarction, unspecified: Secondary | ICD-10-CM

## 2013-05-28 DIAGNOSIS — I493 Ventricular premature depolarization: Secondary | ICD-10-CM

## 2013-05-28 DIAGNOSIS — I4891 Unspecified atrial fibrillation: Secondary | ICD-10-CM

## 2013-05-28 DIAGNOSIS — J449 Chronic obstructive pulmonary disease, unspecified: Secondary | ICD-10-CM

## 2013-05-28 DIAGNOSIS — I6350 Cerebral infarction due to unspecified occlusion or stenosis of unspecified cerebral artery: Secondary | ICD-10-CM

## 2013-05-28 LAB — POCT INR: INR: 2.3

## 2013-05-28 NOTE — Assessment & Plan Note (Signed)
Recent hospital records reviewed. Discussed with the family in detail. They're concerned as this is his second stroke/TIA in 2 months. Encouraged him to stay on warfarin. We did discuss pradaxa or eliquis. He previously had a bleed on xarelto. He is reluctant to start one of the other 2. He is uncomfortable with having no reversal agent given his history of bleeding. We'll continue to push his INR goal from 2.2 up to 3 which she has been doing for the most part over the past month, though low recently which perhaps contributed to his recent TIA. He is comfortable with this though very concerned. He'll also take aspirin 81 mg daily.

## 2013-05-28 NOTE — Assessment & Plan Note (Signed)
Heart rate relatively well controlled. We'll keep on warfarin for now. No recent bleeding

## 2013-05-28 NOTE — Assessment & Plan Note (Signed)
Currently with no symptoms of angina. No further workup at this time. Continue current medication regimen. 

## 2013-05-28 NOTE — Assessment & Plan Note (Signed)
Orthostatic type symptoms have resolved on Florinef daily. No recent near syncope or syncope

## 2013-05-28 NOTE — Assessment & Plan Note (Signed)
History of severe COPD and frequent exacerbations. Currently appears to be stable

## 2013-05-28 NOTE — Patient Instructions (Signed)
You are doing well. No medication changes were made.  Goal INR is 2.2 to 3 Stay on the same warfarin dosing  Please call us if you have new issues that need to be addressed before your next appt.  Your physician wants you to follow-up in: 6 months.  You will receive a reminder letter in the mail two months in advance. If you don't receive a letter, please call our office to schedule the follow-up appointment.

## 2013-05-28 NOTE — Progress Notes (Signed)
Patient ID: Javier [name]., male    DOB: 01-Feb-1936, 77 y.o.   MRN: [id number]  HPI Comments: 77 year old male with history of smoking 40 years, severe asymmetric LVH with moderate LVOT gradient,  COPD, coronary artery disease with bypass surgery in 2007, occlusion of 2 vein grafts with PCI x4, peripheral vascular disease with bilateral carotid endarterectomies in 2002, history of TIA who has presented several times to [facility] with COPD exacerbation,  TIA, frequent episodse of COPD exacerbation/bronchitis often needing long courses of antibiotics and steroids , most recent cardiac catheterization at [facility] showing patent vessels with no significant progression of his disease, who presents for routine followup . He was previously found to be resistant to Plavix by P2Y12 assay  at [facility]  He presents today for followup after recent TIA. On October 5, he woke in the middle of the night with left arm and left leg weakness. He called 911 and was initially evaluated at [facility], and transferred to [place]. CT scan of the head, MRI did not show an acute stroke. EEG by his report was negative. He was discharged home on warfarin at the same dose. It was noted that INR was 1.79 which was below his typical baseline in the 2 range. He was discharged on 05/25/2013. Since then he has felt well and has had a full recovery.   hospitalization in May 2013 chest pain, COPD exacerbation. Stress test showed no ischemia.  Echocardiogram was essentially normal with mild MR. received significant IV fluids through his hospital course.  Edema got worse through the next week with significant pain in his legs. This improved on outpatient diuretics. Lasix held periodically for low blood pressure Found to be very iron deficient, very anemic. Labs show iron level XVI, hematocrit 24 BNP 400  admission to the hospital 08/31/2012 for shortness of breath. Treated for COPD exacerbation, started on Levaquin IV steroids. CT scan of the chest  showing stable pulmonary nodule.   Several recent hospital admissions for orthostatic hypotension, syncope. Previous admission 02/06/2013 Last hospitalized end of July with discharge July 31 with syncope, orthostatic hypotension. Creatinine was 1.54 with BUN 30 on arrival Lasix has been held.   seen by hematology, Dr. Neale Burly, suggested that he have additional testing for platelet aggregation.  He was previously not on warfarin secondary to recent GI bleeds, as well as hematuria. Stroke August 2014 while not on warfarin. He had acute onset mental status changes with garbled speech. He was given TPA on route to [facility], additional workup included carotid ultrasound, head CT scan and MRA. He was discharged on aspirin. He reports near complete resolution of his symptoms.  Family was very worried about him not being on better anticoagulation. We have a long discussion about hematuria, GI bleeding in the past. They're all willing to accept any bleeding risk in an effort to reduce the risk of stroke from his atrial fibrillation.   He presents today in followup after recent hospital admission for TIA and left-sided weakness. INR 1.79. INR greater than 2.2 on today's visit Creatinine on 05/23/2013 was 1.54, BUN 19, potassium 3.4  Prior Carotid ultrasound done in the hospital 01/12/2012 shows no significant carotid disease , repeat study August 2014 EKG shows atrial fibrillation with rate of 79 beats per minute   Outpatient Encounter Prescriptions as of 05/28/2013  Medication Sig Dispense Refill  . albuterol (VENTOLIN HFA) 108 (90 BASE) MCG/ACT inhaler Inhale 2 puffs into the lungs every 6 (six) hours as needed for  wheezing.      Marland Kitchen ALPRAZolam (XANAX) 0.25 MG tablet Take 0.25 mg by mouth every 6 (six) hours as needed for anxiety.       . Ascorbic Acid (VITAMIN C) 1000 MG tablet Take 1,000 mg by mouth daily.      Marland Kitchen aspirin EC 81 MG tablet Take 81 mg by mouth daily.      . budesonide-formoterol  (SYMBICORT) 160-4.5 MCG/ACT inhaler Inhale 2 puffs into the lungs 2 (two) times daily.       Marland Kitchen docusate sodium (COLACE) 100 MG capsule Take 100 mg by mouth 2 (two) times daily.      . Ferrous Sulfate (IRON) 325 (65 FE) MG TABS Take 325 mg by mouth 2 (two) times daily.       . fludrocortisone (FLORINEF) 0.1 MG tablet Take 1 tablet (0.1 mg total) by mouth daily.  30 tablet  3  . furosemide (LASIX) 20 MG tablet Take 20 mg by mouth daily as needed for fluid or edema.      Marland Kitchen ipratropium-albuterol (DUONEB) 0.5-2.5 (3) MG/3ML SOLN Take 3 mLs by nebulization every 6 (six) hours as needed (for shortness of breath).       Marland Kitchen leuprolide, 6 Month, (ELIGARD) 45 MG injection Inject 45 mg into the skin every 6 (six) months.      . montelukast (SINGULAIR) 10 MG tablet Take 10 mg by mouth at bedtime.       . nitroGLYCERIN (NITROSTAT) 0.4 MG SL tablet Place 0.4 mg under the tongue every 5 (five) minutes as needed for chest pain.      . NON FORMULARY Oxygen 2 liters daily.      Marland Kitchen omega-3 acid ethyl esters (LOVAZA) 1 G capsule Take 1 g by mouth 2 (two) times daily.       Marland Kitchen omeprazole (PRILOSEC) 20 MG capsule Take 20 mg by mouth daily.       . potassium chloride (K-DUR) 10 MEQ tablet Take 10 mEq by mouth daily as needed (takes with lasix).      . rosuvastatin (CRESTOR) 20 MG tablet Take 20 mg by mouth every evening.       . senna (SENOKOT) 8.6 MG tablet Take 1 tablet by mouth daily.      . sertraline (ZOLOFT) 50 MG tablet Take 50 mg by mouth daily.      Marland Kitchen tiotropium (SPIRIVA) 18 MCG inhalation capsule Place 18 mcg into inhaler and inhale daily.       Marland Kitchen topiramate (TOPAMAX) 25 MG capsule Take 25 mg by mouth 2 (two) times daily.      Marland Kitchen warfarin (COUMADIN) 3 MG tablet Take 1.5-3 mg by mouth daily. 3mg  Monday, Wednesday, Friday; 1.5mg  the rest of the week       No facility-administered encounter medications on file as of 05/28/2013.     Review of Systems  Constitutional: Negative.   HENT: Negative.   Eyes:  Negative.   Respiratory: Negative.   Cardiovascular: Negative.   Gastrointestinal: Negative.   Endocrine: Negative.   Musculoskeletal: Negative.   Skin: Negative.   Allergic/Immunologic: Negative.   Neurological: Negative.   Hematological: Negative.   Psychiatric/Behavioral: Negative.   All other systems reviewed and are negative.    BP 138/84  Pulse 79  Ht 6\' 1"  (1.854 m)  Wt 231 lb 8 oz (105.008 kg)  BMI 30.55 kg/m2  Physical Exam  Nursing note and vitals reviewed. Constitutional: He is oriented to person, place, and time. He appears well-developed and  well-nourished.  HENT:  Head: Normocephalic.  Nose: Nose normal.  Mouth/Throat: Oropharynx is clear and moist.  Eyes: Conjunctivae are normal. Pupils are equal, round, and reactive to light.  Neck: Normal range of motion. Neck supple. No JVD present.  Cardiovascular: Normal rate, S1 normal, S2 normal and intact distal pulses.  An irregularly irregular rhythm present. Exam reveals no gallop and no friction rub.   Murmur heard.  Crescendo systolic murmur is present with a grade of 2/6  Trace pitting above the sock line bilaterally  Pulmonary/Chest: Effort normal. No respiratory distress. He has decreased breath sounds. He has no wheezes. He has no rales. He exhibits no tenderness.  Abdominal: Soft. Bowel sounds are normal. He exhibits no distension. There is no tenderness.  Musculoskeletal: Normal range of motion. He exhibits no edema and no tenderness.  Lymphadenopathy:    He has no cervical adenopathy.  Neurological: He is alert and oriented to person, place, and time. Coordination normal.  Skin: Skin is warm and dry. No rash noted. No erythema.  Psychiatric: He has a normal mood and affect. His behavior is normal. Judgment and thought content normal.      Assessment and Plan

## 2013-05-28 NOTE — Assessment & Plan Note (Signed)
Cholesterol is at goal on the current lipid regimen. No changes to the medications were made.  

## 2013-06-02 ENCOUNTER — Emergency Department (HOSPITAL_COMMUNITY): Payer: Medicare Other

## 2013-06-02 ENCOUNTER — Ambulatory Visit (INDEPENDENT_AMBULATORY_CARE_PROVIDER_SITE_OTHER): Payer: Medicare Other | Admitting: General Practice

## 2013-06-02 ENCOUNTER — Encounter (HOSPITAL_COMMUNITY): Payer: Self-pay

## 2013-06-02 ENCOUNTER — Observation Stay (HOSPITAL_COMMUNITY)
Admission: EM | Admit: 2013-06-02 | Discharge: 2013-06-03 | Disposition: A | Discharge status: Home / Self Care | Payer: Medicare Other | Attending: Internal Medicine | Admitting: Internal Medicine

## 2013-06-02 DIAGNOSIS — I252 Old myocardial infarction: Secondary | ICD-10-CM | POA: Insufficient documentation

## 2013-06-02 DIAGNOSIS — I739 Peripheral vascular disease, unspecified: Secondary | ICD-10-CM | POA: Insufficient documentation

## 2013-06-02 DIAGNOSIS — Z7901 Long term (current) use of anticoagulants: Secondary | ICD-10-CM | POA: Insufficient documentation

## 2013-06-02 DIAGNOSIS — J4489 Other specified chronic obstructive pulmonary disease: Secondary | ICD-10-CM | POA: Insufficient documentation

## 2013-06-02 DIAGNOSIS — Z8673 Personal history of transient ischemic attack (TIA), and cerebral infarction without residual deficits: Secondary | ICD-10-CM | POA: Insufficient documentation

## 2013-06-02 DIAGNOSIS — G459 Transient cerebral ischemic attack, unspecified: Secondary | ICD-10-CM

## 2013-06-02 DIAGNOSIS — I951 Orthostatic hypotension: Secondary | ICD-10-CM | POA: Diagnosis present

## 2013-06-02 DIAGNOSIS — Z9861 Coronary angioplasty status: Secondary | ICD-10-CM | POA: Insufficient documentation

## 2013-06-02 DIAGNOSIS — J449 Chronic obstructive pulmonary disease, unspecified: Secondary | ICD-10-CM | POA: Insufficient documentation

## 2013-06-02 DIAGNOSIS — I4891 Unspecified atrial fibrillation: Secondary | ICD-10-CM

## 2013-06-02 DIAGNOSIS — I25810 Atherosclerosis of coronary artery bypass graft(s) without angina pectoris: Secondary | ICD-10-CM | POA: Diagnosis present

## 2013-06-02 DIAGNOSIS — Z79899 Other long term (current) drug therapy: Secondary | ICD-10-CM | POA: Insufficient documentation

## 2013-06-02 DIAGNOSIS — I639 Cerebral infarction, unspecified: Secondary | ICD-10-CM

## 2013-06-02 DIAGNOSIS — E785 Hyperlipidemia, unspecified: Secondary | ICD-10-CM | POA: Diagnosis present

## 2013-06-02 DIAGNOSIS — I6350 Cerebral infarction due to unspecified occlusion or stenosis of unspecified cerebral artery: Secondary | ICD-10-CM

## 2013-06-02 DIAGNOSIS — I10 Essential (primary) hypertension: Secondary | ICD-10-CM

## 2013-06-02 DIAGNOSIS — I5032 Chronic diastolic (congestive) heart failure: Secondary | ICD-10-CM

## 2013-06-02 DIAGNOSIS — Z951 Presence of aortocoronary bypass graft: Secondary | ICD-10-CM | POA: Insufficient documentation

## 2013-06-02 LAB — DIFFERENTIAL
Basophils Relative: 0 % (ref 0–1)
Eosinophils Absolute: 0.1 10*3/uL (ref 0.0–0.7)
Lymphs Abs: 1.8 10*3/uL (ref 0.7–4.0)
Monocytes Absolute: 0.7 10*3/uL (ref 0.1–1.0)
Monocytes Relative: 7 % (ref 3–12)
Neutrophils Relative %: 72 % (ref 43–77)

## 2013-06-02 LAB — POCT I-STAT, CHEM 8
BUN: 17 mg/dL (ref 6–23)
Calcium, Ion: 1.15 mmol/L (ref 1.13–1.30)
HCT: 38 % — ABNORMAL LOW (ref 39.0–52.0)
Hemoglobin: 12.9 g/dL — ABNORMAL LOW (ref 13.0–17.0)
Sodium: 141 mEq/L (ref 135–145)
TCO2: 20 mmol/L (ref 0–100)

## 2013-06-02 LAB — COMPREHENSIVE METABOLIC PANEL
AST: 32 U/L (ref 0–37)
Albumin: 3.4 g/dL — ABNORMAL LOW (ref 3.5–5.2)
Alkaline Phosphatase: 56 U/L (ref 39–117)
BUN: 16 mg/dL (ref 6–23)
Chloride: 105 mEq/L (ref 96–112)
Creatinine, Ser: 1.22 mg/dL (ref 0.50–1.35)
GFR calc Af Amer: 65 mL/min — ABNORMAL LOW (ref >90)
Glucose, Bld: 82 mg/dL (ref 70–99)
Potassium: 3.9 mEq/L (ref 3.5–5.1)
Total Bilirubin: 0.5 mg/dL (ref 0.3–1.2)
Total Protein: 7.2 g/dL (ref 6.0–8.3)

## 2013-06-02 LAB — POCT I-STAT TROPONIN I: Troponin i, poc: 0.02 ng/mL (ref 0.00–0.08)

## 2013-06-02 LAB — RAPID URINE DRUG SCREEN, HOSP PERFORMED
Benzodiazepines: NOT DETECTED
Cocaine: NOT DETECTED
Opiates: POSITIVE — AB
Tetrahydrocannabinol: NOT DETECTED

## 2013-06-02 LAB — CBC
HCT: 37.3 % — ABNORMAL LOW (ref 39.0–52.0)
Hemoglobin: 12.7 g/dL — ABNORMAL LOW (ref 13.0–17.0)
MCH: 29.7 pg (ref 26.0–34.0)
MCHC: 34 g/dL (ref 30.0–36.0)
Platelets: 154 10*3/uL (ref 150–400)
RBC: 4.28 MIL/uL (ref 4.22–5.81)

## 2013-06-02 LAB — PROTIME-INR: INR: 2.43 — ABNORMAL HIGH (ref 0.00–1.49)

## 2013-06-02 LAB — TROPONIN I: Troponin I: <0.3 ng/mL (ref <0.30)

## 2013-06-02 MED ORDER — WARFARIN - PHARMACIST DOSING INPATIENT: Freq: Every day | Status: DC

## 2013-06-02 MED ORDER — FLUDROCORTISONE ACETATE 0.1 MG PO TABS
0.1000 mg | ORAL_TABLET | Freq: Every day | ORAL | Status: DC
  Administered 2013-06-03: 0.1 mg via ORAL
  Filled 2013-06-02: qty 1

## 2013-06-02 MED ORDER — FUROSEMIDE 20 MG PO TABS
20.0000 mg | ORAL_TABLET | Freq: Every day | ORAL | Status: DC | PRN
  Filled 2013-06-02: qty 1

## 2013-06-02 MED ORDER — SENNA 8.6 MG PO TABS
1.0000 | ORAL_TABLET | Freq: Every day | ORAL | Status: DC
  Administered 2013-06-03: 8.6 mg via ORAL
  Filled 2013-06-02: qty 1

## 2013-06-02 MED ORDER — ASPIRIN 300 MG RE SUPP
300.0000 mg | Freq: Once | RECTAL | Status: DC
  Filled 2013-06-02: qty 1

## 2013-06-02 MED ORDER — MONTELUKAST SODIUM 10 MG PO TABS
10.0000 mg | ORAL_TABLET | Freq: Every day | ORAL | Status: DC
  Administered 2013-06-02: 10 mg via ORAL
  Filled 2013-06-02 (×2): qty 1

## 2013-06-02 MED ORDER — PANTOPRAZOLE SODIUM 40 MG PO TBEC
40.0000 mg | DELAYED_RELEASE_TABLET | Freq: Every day | ORAL | Status: DC
  Administered 2013-06-03: 40 mg via ORAL
  Filled 2013-06-02: qty 1

## 2013-06-02 MED ORDER — ACETAMINOPHEN 325 MG PO TABS
650.0000 mg | ORAL_TABLET | Freq: Once | ORAL | Status: AC
  Administered 2013-06-02: 650 mg via ORAL
  Filled 2013-06-02: qty 2

## 2013-06-02 MED ORDER — WARFARIN 0.5 MG HALF TABLET
1.5000 mg | ORAL_TABLET | ORAL | Status: DC
  Filled 2013-06-02: qty 1

## 2013-06-02 MED ORDER — ASPIRIN EC 81 MG PO TBEC
81.0000 mg | DELAYED_RELEASE_TABLET | Freq: Every day | ORAL | Status: DC
  Administered 2013-06-03: 81 mg via ORAL
  Filled 2013-06-02: qty 1

## 2013-06-02 MED ORDER — SERTRALINE HCL 50 MG PO TABS
50.0000 mg | ORAL_TABLET | Freq: Every day | ORAL | Status: DC
  Administered 2013-06-03: 50 mg via ORAL
  Filled 2013-06-02 (×2): qty 1

## 2013-06-02 MED ORDER — SENNOSIDES 8.6 MG PO TABS: 1.0000 | ORAL_TABLET | Freq: Every day | ORAL | Status: DC

## 2013-06-02 MED ORDER — ALPRAZOLAM 0.25 MG PO TABS
0.2500 mg | ORAL_TABLET | Freq: Four times a day (QID) | ORAL | Status: DC | PRN
  Administered 2013-06-03: 0.25 mg via ORAL
  Filled 2013-06-02 (×2): qty 1

## 2013-06-02 MED ORDER — TOPIRAMATE 25 MG PO CPSP: 25.0000 mg | ORAL_CAPSULE | Freq: Every day | ORAL | Status: DC

## 2013-06-02 MED ORDER — WARFARIN SODIUM 3 MG PO TABS
3.0000 mg | ORAL_TABLET | ORAL | Status: AC
  Administered 2013-06-02: 3 mg via ORAL
  Filled 2013-06-02 (×2): qty 1

## 2013-06-02 MED ORDER — TOPIRAMATE 25 MG PO TABS
25.0000 mg | ORAL_TABLET | Freq: Every day | ORAL | Status: DC
  Administered 2013-06-03: 25 mg via ORAL
  Filled 2013-06-02: qty 1

## 2013-06-02 MED ORDER — FERROUS SULFATE 325 (65 FE) MG PO TABS
325.0000 mg | ORAL_TABLET | Freq: Two times a day (BID) | ORAL | Status: DC
  Administered 2013-06-02 – 2013-06-03 (×2): 325 mg via ORAL
  Filled 2013-06-02 (×3): qty 1

## 2013-06-02 MED ORDER — POTASSIUM CHLORIDE ER 10 MEQ PO TBCR
10.0000 meq | EXTENDED_RELEASE_TABLET | Freq: Every day | ORAL | Status: DC | PRN
  Filled 2013-06-02: qty 1

## 2013-06-02 MED ORDER — IRON 325 (65 FE) MG PO TABS: 325.0000 mg | ORAL_TABLET | Freq: Two times a day (BID) | ORAL | Status: DC

## 2013-06-02 MED ORDER — ATORVASTATIN CALCIUM 40 MG PO TABS
40.0000 mg | ORAL_TABLET | Freq: Every day | ORAL | Status: DC
  Administered 2013-06-02: 40 mg via ORAL
  Filled 2013-06-02 (×2): qty 1

## 2013-06-02 MED ORDER — OMEGA-3-ACID ETHYL ESTERS 1 G PO CAPS
1.0000 g | ORAL_CAPSULE | Freq: Two times a day (BID) | ORAL | Status: DC
  Administered 2013-06-02 – 2013-06-03 (×2): 1 g via ORAL
  Filled 2013-06-02 (×3): qty 1

## 2013-06-02 MED ORDER — WARFARIN SODIUM 3 MG PO TABS: 3.0000 mg | ORAL_TABLET | ORAL | Status: DC

## 2013-06-02 MED ORDER — ASPIRIN 81 MG PO CHEW
324.0000 mg | CHEWABLE_TABLET | Freq: Once | ORAL | Status: AC
  Administered 2013-06-02: 324 mg via ORAL
  Filled 2013-06-02: qty 4

## 2013-06-02 MED ORDER — DOCUSATE SODIUM 100 MG PO CAPS
100.0000 mg | ORAL_CAPSULE | Freq: Two times a day (BID) | ORAL | Status: DC
  Administered 2013-06-02 – 2013-06-03 (×2): 100 mg via ORAL
  Filled 2013-06-02 (×2): qty 1

## 2013-06-02 MED ORDER — BUDESONIDE-FORMOTEROL FUMARATE 160-4.5 MCG/ACT IN AERO
2.0000 | INHALATION_SPRAY | Freq: Two times a day (BID) | RESPIRATORY_TRACT | Status: DC
  Administered 2013-06-02 – 2013-06-03 (×2): 2 via RESPIRATORY_TRACT
  Filled 2013-06-02: qty 6

## 2013-06-02 MED ORDER — ALBUTEROL SULFATE HFA 108 (90 BASE) MCG/ACT IN AERS: 2.0000 | INHALATION_SPRAY | Freq: Four times a day (QID) | RESPIRATORY_TRACT | Status: DC | PRN

## 2013-06-02 MED ORDER — TIOTROPIUM BROMIDE MONOHYDRATE 18 MCG IN CAPS
18.0000 ug | ORAL_CAPSULE | Freq: Every day | RESPIRATORY_TRACT | Status: DC
  Administered 2013-06-03: 18 ug via RESPIRATORY_TRACT
  Filled 2013-06-02: qty 5

## 2013-06-02 NOTE — ED Provider Notes (Signed)
CSN: [id number]     Arrival date & time 06/02/13  1445 History   First MD Initiated Contact with Patient 06/02/13 1505     Chief Complaint  Patient presents with  . Code Stroke   (Consider location/radiation/quality/duration/timing/severity/associated sxs/prior Treatment) HPI Comments: Javier [name] a 77 y.o. male who states that he had sudden onset of weakness in left arm and leg while in his doctor's office today. The weakness has since improved. He presented to the ED as a code stroke. He was seen initially by the hospitalist, who asked me to evaluate him. His head CT was negative. He reports he has had this same symptom 3 different times. He has had a recent stroke evaluation, at this facility. He presented here by EMS. He had his INR checked today at his cardiologist's office, and it was normal. He denies recent fever, chills, nausea, vomiting, chest pain or shortness of breath. There are no other known modifying factors.   The history [name] provided by the patient.    Past Medical History  Diagnosis Date  . CAD (coronary artery disease)   . Hypertension   . Atypical angina   . PVD (peripheral vascular disease)   . Prostate cancer   . Hyperlipidemia   . COPD (chronic obstructive pulmonary disease)   . TIA (transient ischemic attack) 07/2010  . Myocardial infarction 11/2010  . Anemia   . Stroke   . Anxiety    Past Surgical History  Procedure Laterality Date  . Coronary artery bypass graft      6 years ago  . Carotid endarterectomy      bilateral  . Cardiac catheterization  2009, 2012    stents placed   Family History  Problem Relation Age of Onset  . Heart disease Mother   . Stomach cancer Father   . Throat cancer Father   . Heart attack Brother    History  Substance Use Topics  . Smoking status: Former Smoker -- 1.50 packs/day for 40 years    Quit date: 08/20/2007  . Smokeless tobacco: Never Used  . Alcohol Use: No    Review of Systems  All other  systems reviewed and are negative.    Allergies  Prednisone  Home Medications   Current Outpatient Rx  Name  Route  Sig  Dispense  Refill  . albuterol (VENTOLIN HFA) 108 (90 BASE) MCG/ACT inhaler   Inhalation   Inhale 2 puffs into the lungs every 6 (six) hours as needed for wheezing.         Marland Kitchen ALPRAZolam (XANAX) 0.25 MG tablet   Oral   Take 0.25 mg by mouth every 6 (six) hours as needed for anxiety.          . Ascorbic Acid (VITAMIN C) 1000 MG tablet   Oral   Take 1,000 mg by mouth daily.         Marland Kitchen aspirin EC 81 MG tablet   Oral   Take 81 mg by mouth daily.         . budesonide-formoterol (SYMBICORT) 160-4.5 MCG/ACT inhaler   Inhalation   Inhale 2 puffs into the lungs 2 (two) times daily.          Marland Kitchen docusate sodium (COLACE) 100 MG capsule   Oral   Take 100 mg by mouth 2 (two) times daily.         . Ferrous Sulfate (IRON) 325 (65 FE) MG TABS   Oral   Take 325  mg by mouth 2 (two) times daily.          . fludrocortisone (FLORINEF) 0.1 MG tablet   Oral   Take 1 tablet (0.1 mg total) by mouth daily.   30 tablet   3   . furosemide (LASIX) 20 MG tablet   Oral   Take 20 mg by mouth daily as needed for fluid or edema.         Marland Kitchen ipratropium-albuterol (DUONEB) 0.5-2.5 (3) MG/3ML SOLN   Nebulization   Take 3 mLs by nebulization every 6 (six) hours as needed (for shortness of breath).          Marland Kitchen leuprolide, 6 Month, (ELIGARD) 45 MG injection   Subcutaneous   Inject 45 mg into the skin every 6 (six) months.         . montelukast (SINGULAIR) 10 MG tablet   Oral   Take 10 mg by mouth at bedtime.          . nitroGLYCERIN (NITROSTAT) 0.4 MG SL tablet   Sublingual   Place 0.4 mg under the tongue every 5 (five) minutes as needed for chest pain.         Marland Kitchen omega-3 acid ethyl esters (LOVAZA) 1 G capsule   Oral   Take 1 g by mouth 2 (two) times daily.          Marland Kitchen omeprazole (PRILOSEC) 20 MG capsule   Oral   Take 20 mg by mouth daily.           . potassium chloride (K-DUR) 10 MEQ tablet   Oral   Take 10 mEq by mouth daily as needed (takes with lasix).         . rosuvastatin (CRESTOR) 20 MG tablet   Oral   Take 20 mg by mouth every evening.          . senna (SENOKOT) 8.6 MG tablet   Oral   Take 1 tablet by mouth daily.         . sertraline (ZOLOFT) 50 MG tablet   Oral   Take 50 mg by mouth daily.         Marland Kitchen tiotropium (SPIRIVA) 18 MCG inhalation capsule   Inhalation   Place 18 mcg into inhaler and inhale daily.          Marland Kitchen topiramate (TOPAMAX) 25 MG capsule   Oral   Take 25 mg by mouth daily.          Marland Kitchen warfarin (COUMADIN) 3 MG tablet   Oral   Take 1.5-3 mg by mouth daily. 3mg  Monday, Wednesday, Friday; 1.5mg  the rest of the week          BP 153/95  Pulse 68  Temp(Src) 98.6 F (37 C)  Resp 21  SpO2 95% Physical Exam  Nursing note and vitals reviewed. Constitutional: He [name] oriented to person, place, and time. He appears well-developed.  Elderly, frail  HENT:  Head: Normocephalic and atraumatic.  Right Ear: External ear normal.  Left Ear: External ear normal.  Eyes: Conjunctivae and EOM are normal. Pupils are equal, round, and reactive to light.  Neck: Normal range of motion and phonation normal. Neck supple.  Cardiovascular: Normal rate, regular rhythm, normal heart sounds and intact distal pulses.   Pulmonary/Chest: Effort normal and breath sounds normal. He exhibits no bony tenderness.  Abdominal: Soft. Normal appearance. There [name] no tenderness.  Musculoskeletal: Normal range of motion.  Neurological: He [name] alert and oriented to person, place,  and time. No cranial nerve deficit or sensory deficit. He exhibits normal muscle tone. Coordination normal.  No dysarthria or aphasia. Mildly left arm and leg. He [name] able to elevate both the left arm and leg.  Skin: Skin [name] warm, dry and intact.  Psychiatric: He has a normal mood and affect. His behavior [name] normal. Judgment and thought content normal.     ED Course  Procedures (including critical care time) Medications  acetaminophen (TYLENOL) tablet 650 mg (not administered)    I discussed the case with Dr. Roseanne Reno, stroke, neurologist; after he saw the patient. We arranged a plan to evaluate the patient with an MRI, and if it was normal, plus he had resolved symptoms, that he could be discharged to home.  Patient Vitals for the past 24 hrs:  BP Temp Pulse Resp SpO2  06/02/13 1715 153/95 mmHg - 68 21 95 %  06/02/13 1650 168/111 mmHg - 88 - 100 %  06/02/13 1545 146/93 mmHg - 79 21 94 %  06/02/13 1537 - 98.6 F (37 C) - - -  06/02/13 1530 146/76 mmHg - 79 20 95 %  06/02/13 1515 152/90 mmHg - 80 18 96 %    5:55 PM Reevaluation with update and discussion. After initial assessment and treatment, an updated evaluation reveals he continues to have weakness, left arm and leg. There [name] no dysarthria. He complains of a diffuse headache at this time. He was offered Tylenol and thought that that would help. All questions were answered. Martie Muhlbauer L    6:01 PM-Consult complete with IM Resident. Patient case explained and discussed. She agrees to admit patient for further evaluation and treatment. Call ended at 1812  Labs Review Labs Reviewed  PROTIME-INR - Abnormal; Notable for the following:    Prothrombin Time 25.6 (*)    INR 2.43 (*)    All other components within normal limits  APTT - Abnormal; Notable for the following:    aPTT 40 (*)    All other components within normal limits  CBC - Abnormal; Notable for the following:    Hemoglobin 12.7 (*)    HCT 37.3 (*)    All other components within normal limits  COMPREHENSIVE METABOLIC PANEL - Abnormal; Notable for the following:    Albumin 3.4 (*)    GFR calc non Af Amer 56 (*)    GFR calc Af Amer 65 (*)    All other components within normal limits  POCT I-STAT, CHEM 8 - Abnormal; Notable for the following:    Creatinine, Ser 1.50 (*)    Hemoglobin 12.9 (*)    HCT 38.0 (*)     All other components within normal limits  DIFFERENTIAL  TROPONIN I  GLUCOSE, CAPILLARY  POCT I-STAT TROPONIN I   Imaging Review Ct Head (brain) Wo Contrast  06/02/2013   CLINICAL DATA:  Code stroke. Slurred speech  EXAM: CT HEAD WITHOUT CONTRAST  TECHNIQUE: Contiguous axial images were obtained from the base of the skull through the vertex without intravenous contrast.  COMPARISON:  MRI 05/23/2013. CT 04/20/2013  FINDINGS: Generalized atrophy. Chronic microvascular ischemic changes in the white matter. Negative for acute infarct. Negative for hemorrhage or mass. No midline shift. No focal skull abnormality.  IMPRESSION: Atrophy and chronic microvascular ischemia. No acute abnormality.  Critical Value/emergent results were called by telephone at the time of interpretation on 06/02/2013 at 3:11 PM to Dr.Stewart , who verbally acknowledged these results.   Electronically Signed   By: Marlan Palau M.D.  On: 06/02/2013 15:12   Mr Brain Wo Contrast  06/02/2013   CLINICAL DATA:  77 year old male with acute onset left side weakness. Code stroke.  EXAM: MRI HEAD WITHOUT CONTRAST  TECHNIQUE: Multiplanar, multisequence MR imaging was performed. No intravenous contrast was administered.  COMPARISON:  Brain MRI and MRA 05/23/2013 and earlier.  FINDINGS: Diffusion imaging today appears stable and negative. No restricted diffusion or evidence of acute infarction.  Stable cerebral volume. No midline shift, mass effect, or evidence of intracranial mass lesion. No ventriculomegaly. Negative pituitary, cervicomedullary junction and visualized cervical spine. No acute intracranial hemorrhage identified. Major intracranial vascular flow voids are stable. Stable patchy cerebral white matter T2 and FLAIR hyperintensity in a nonspecific configuration. No cortical encephalomalacia. Signal remains within normal limits in the deep gray matter nuclei, brainstem, and cerebellum (except for a tiny chronic micro hemorrhage  suspected in the left cerebellum on series 6, image 7, unchanged).  Normal bone marrow signal. Stable orbits soft tissues. Stable paranasal sinuses. Trace left mastoid fluid [name] new. Negative visualized nasopharynx. Visualized scalp soft tissues are within normal limits.  IMPRESSION: No acute infarct identified. Stable non contrast MRI appearance of the brain.   Electronically Signed   By: Augusto Gamble M.D.   On: 06/02/2013 17:27    EKG Interpretation   None         Date: 06/02/13  Rate: 87  Rhythm: normal sinus rhythm  QRS Axis: left  PR and QT Intervals: PR shortened  ST/T Wave abnormalities: nonspecific ST changes  PR and QRS Conduction Disutrbances:none  Narrative Interpretation:   Old EKG Reviewed: changes noted- on 05/28/13, rhythm atrial fibrillation     MDM   1. TIA (transient ischemic attack)   2. Atrial fibrillation   3. Hypertension    Nonspecific left-sided weakness, possibly TIA. His symptoms are somewhat prolonged with negative MRI, brain. There [name] no clear evidence for acute vascular process. He will be evaluated expectantly with the current treatment paradigm. He does not appear to need additional evaluation of his brain or vascular system.  Nursing Notes Reviewed/ Care Coordinated, and agree without changes. Applicable Imaging Reviewed.  Interpretation of Laboratory Data incorporated into ED treatment  Plan: Admit as a medical patient to a Hospitalist service, with neurology consultation.    Flint Melter, MD 06/02/13 [phone]

## 2013-06-02 NOTE — Consult Note (Signed)
Referring Physician: Dr. Effie Shy    Chief Complaint: Recurrent weakness of left extremities as well as numbness.  HPI: Javier [name]. is an 77 y.o. male with a history of atrial fibrillation, hypertension, peripheral vascular disease and hyperlipidemia, as well as history of recent stroke and TIA, presenting with recurrent acute weakness and numbness of left extremities. Patient reportedly had slurred speech in route to the hospital, which has cleared. He was given IV TPA about 6 weeks ago for acute right cerebral infarction. He was admitted 10 days ago for TIA. Workup was unremarkable. He was on Coumadin and aspirin at the time which were continued. He was last known well at 2:20 PM today. His INR here today was 2.43. CT scan of his head showed no acute intracranial abnormality. NIH stroke score was 7. Poor effort with manual testing of extremities on the left is again noted.  LSN: 2:20 PM on 06/02/2013 tPA Given: No: TPA 6 weeks ago; INR 2.43 mRankin: 2  Past Medical History  Diagnosis Date  . CAD (coronary artery disease)   . Hypertension   . Atypical angina   . PVD (peripheral vascular disease)   . Prostate cancer   . Hyperlipidemia   . COPD (chronic obstructive pulmonary disease)   . TIA (transient ischemic attack) 07/2010  . Myocardial infarction 11/2010  . Anemia   . Stroke   . Anxiety     Family History  Problem Relation Age of Onset  . Heart disease Mother   . Stomach cancer Father   . Throat cancer Father   . Heart attack Brother      Medications: I have reviewed the patient's current medications.  ROS: History obtained from the patient  General ROS: negative for - chills, fatigue, fever, night sweats, weight gain or weight loss Psychological ROS: negative for - behavioral disorder, hallucinations, memory difficulties, mood swings or suicidal ideation Ophthalmic ROS: negative for - blurry vision, double vision, eye pain or loss of vision ENT ROS: negative for -  epistaxis, nasal discharge, oral lesions, sore throat, tinnitus or vertigo Allergy and Immunology ROS: negative for - hives or itchy/watery eyes Hematological and Lymphatic ROS: negative for - bleeding problems, bruising or swollen lymph nodes Endocrine ROS: negative for - galactorrhea, hair pattern changes, polydipsia/polyuria or temperature intolerance Respiratory ROS: negative for - cough, hemoptysis, shortness of breath or wheezing Cardiovascular ROS: negative for - chest pain, dyspnea on exertion, edema or irregular heartbeat Gastrointestinal ROS: negative for - abdominal pain, diarrhea, hematemesis, nausea/vomiting or stool incontinence Genito-Urinary ROS: negative for - dysuria, hematuria, incontinence or urinary frequency/urgency Musculoskeletal ROS: negative for - joint swelling or muscular weakness Neurological ROS: as noted in HPI Dermatological ROS: negative for rash and skin lesion changes  Physical Examination: There were no vitals taken for this visit.  Neurologic Examination: Mental Status: Alert, oriented, thought content appropriate.  Speech fluent without evidence of aphasia. Able to follow commands without difficulty. Cranial Nerves: II-Visual fields were normal. III/IV/VI-Pupils were equal and reacted. Extraocular movements were full and conjugate.    V/VII-mild left facial numbness to tactile stimulation; no facial weakness. VIII-normal. X-normal speech. Motor: Poor effort with testing of left upper and lower extremity strength. Patient was able to hold his left extremities against gravity for at least a few seconds before letting and dropped to the bed. Normal strength of right extremities. Normal muscle tone throughout. Sensory: Normal throughout. Deep Tendon Reflexes: Slightly asymmetric with slightly greater responses elicited from left extremities compared to right  extremities. Plantars: Flexor bilaterally Cerebellar: Normal finger-to-nose testing with use of  his right upper extremity. Carotid auscultation: Normal  Ct Head (brain) Wo Contrast  06/02/2013   CLINICAL DATA:  Code stroke. Slurred speech  EXAM: CT HEAD WITHOUT CONTRAST  TECHNIQUE: Contiguous axial images were obtained from the base of the skull through the vertex without intravenous contrast.  COMPARISON:  MRI 05/23/2013. CT 04/20/2013  FINDINGS: Generalized atrophy. Chronic microvascular ischemic changes in the white matter. Negative for acute infarct. Negative for hemorrhage or mass. No midline shift. No focal skull abnormality.  IMPRESSION: Atrophy and chronic microvascular ischemia. No acute abnormality.  Critical Value/emergent results were called by telephone at the time of interpretation on 06/02/2013 at 3:11 PM to Dr.Riva Sesma , who verbally acknowledged these results.   Electronically Signed   By: Marlan Palau M.D.   On: 06/02/2013 15:12    Assessment: 77 y.o. male presenting with possible recurrent right subcortical MCA territory transient ischemic attack. Recurrent right subcortical ischemic infarction cannot be ruled out. Patient has significant risk factors for stroke including hyperlipidemia and hypertension, as well as atrial fibrillation. INR is therapeutic.  Stroke Risk Factors - atrial fibrillation, hyperlipidemia and hypertension  Plan: 1. MRI of the brain without contrast 2. Admission the patient's deficits persist and/or MRI study shows recurrent acute stroke 3. Continue Coumadin and aspirin daily with no changes in current doses 4. No indication for admission the patient's deficits cleared and MRI is negative.   C.R. Roseanne Reno, MD [facility]  06/02/2013, 3:33 PM

## 2013-06-02 NOTE — ED Notes (Addendum)
Per EMS: Pt at PCP when he started experiencing left sided weakness with numbness/tingling at 1420. Pt initially had slurred speech that resolved with EMS. Pt also reports confusion during onset of syx, but it now AO x4. VSS. Hx: A Fib, currently taking coumadin

## 2013-06-02 NOTE — ED Notes (Signed)
Attempted to give reportx1 

## 2013-06-02 NOTE — ED Notes (Signed)
Pt has increased effort against gravity on left side as well as increase in sensation. Speech remains clear.

## 2013-06-02 NOTE — ED Notes (Signed)
Pt not a candidate for TPA dt INR level. Pt transported to MR. Will reassess vitals on return.

## 2013-06-02 NOTE — Code Documentation (Signed)
77yo male arriving to [facility] at 23 via [facility].  Patient reports that he was at his primary care physician's office having his INR checked when he developed left sided weakness and numbness.  EMS assessed slurred speech that resolved en route. Patient was seen at [facility] over a month ago and received tPA.  Patient has a h/o TIA and was seen in the ED two weeks ago for the similar symptoms.  LKW 1420, EDP exam/cleared for CT at 1446, stroke team arrival 103, neurologist arrived at 1458, patient arrived in CT at 1452, and phlebotomist arrived at 58.  NIHSS 7 on arrival for left sided weakness and left sensory deficit.  Patient reports that he has been taking Coumadin and ASA, and that his INR was 2.4 in the office today which is confirmed in the EMR.  No acute stroke treatment at this time.  Bedside handoff completed with ED RN Brittney.

## 2013-06-02 NOTE — ED Notes (Signed)
Pt returned from MR  

## 2013-06-02 NOTE — H&P (Signed)
Date: 06/02/2013               Patient Name:  Javier [name]. MRN: [id number]  DOB: 12/08/1935 Age / Sex: 77 y.o., male   PCP: Benay Pike, MD         Medical Service: Internal Medicine Teaching Service         Attending Physician: Dr. Jonah Blue, DO    First Contact: Dr. Yetta Barre  Pager: [phone]  Second Contact: Dr. Virgina Organ Pager: [phone]       After Hours (After 5p/  First Contact Pager: [phone]  weekends / holidays): Second Contact Pager: [phone]   Chief Complaint: left sided weakness  History of Present Illness:  Mr. Boschert is a 77 year old man with history of vascular disease with right CVA in 04/2013 s/p tPA as well as TIA on 05/23/13, MI s/p CABG x3 and PCI x4, PVD s/p bilateral CEA in 2000, atrial fibrillation on Coumadin, HTN, HL, COPD, tobacco abuse (quit in 2013) presenting with acute recurrent weakness on left side as well as transient slurred speech.   Patient states he was at Coumadin clinic this afternoon, was unable to get out of chair due to leg weakness; last normal around 2:15pm.  He reportedly also had slurred speech en route to the hospital, which has since resolved.  He has residual left leg weakness which is improving as well as left hand weakness which he thinks is improving but more slowly.  He had been feeling like himself since his last hospitalization on 10/5 for TIA.  Workup at that hospitalization was unremarkable (see below).  He was on Coumadin and ASA at the time which were continued.  Patient reports good compliance with all of his medications.  He was also given IV tPA about 6 weeks ago for acute right cerebral infarction.  INR today was 2.43. CT scan head and MRI brain showed no acute intracranial abnormality. NIH stroke score was 7.    Meds: Current Facility-Administered Medications  Medication Dose Route Frequency Provider Last Rate Last Dose  . albuterol (PROVENTIL HFA;VENTOLIN HFA) 108 (90 BASE) MCG/ACT inhaler 2 puff  2 puff Inhalation  Q6H PRN Judie Bonus, MD      . ALPRAZolam Prudy Feeler) tablet 0.25 mg  0.25 mg Oral Q6H PRN Judie Bonus, MD      . aspirin chewable tablet 324 mg  324 mg Oral Once Judie Bonus, MD      . Melene Muller ON 06/03/2013] aspirin EC tablet 81 mg  81 mg Oral Daily Judie Bonus, MD      . atorvastatin (LIPITOR) tablet 40 mg  40 mg Oral q1800 Judie Bonus, MD      . budesonide-formoterol [facility]) 160-4.5 MCG/ACT inhaler 2 puff  2 puff Inhalation BID Judie Bonus, MD      . docusate sodium (COLACE) capsule 100 mg  100 mg Oral BID Judie Bonus, MD      . ferrous sulfate tablet 325 mg  325 mg Oral BID Judie Bonus, MD      . Melene Muller ON 06/03/2013] fludrocortisone (FLORINEF) tablet 0.1 mg  0.1 mg Oral Daily Judie Bonus, MD      . furosemide (LASIX) tablet 20 mg  20 mg Oral Daily PRN Judie Bonus, MD      . montelukast (SINGULAIR) tablet 10 mg  10 mg Oral QHS Judie Bonus, MD      . omega-3 acid ethyl esters (  LOVAZA) capsule 1 g  1 g Oral BID Judie Bonus, MD      . pantoprazole (PROTONIX) EC tablet 40 mg  40 mg Oral Daily Judie Bonus, MD      . potassium chloride (K-DUR) CR tablet 10 mEq  10 mEq Oral Daily PRN Judie Bonus, MD      . Gwyndolyn Kaufman [facility]) tablet 8.6 mg  1 tablet Oral Daily Judie Bonus, MD      . sertraline (ZOLOFT) tablet 50 mg  50 mg Oral Daily Judie Bonus, MD      . tiotropium [facility]) inhalation capsule 18 mcg  18 mcg Inhalation Daily Judie Bonus, MD      . Melene Muller ON 06/03/2013] topiramate (TOPAMAX) tablet 25 mg  25 mg Oral Daily Judie Bonus, MD        Allergies: Allergies as of 06/02/2013 - Review Complete 06/02/2013  Allergen Reaction Noted  . Prednisone Other (See Comments) 11/08/2010   Past Medical History  Diagnosis Date  . CAD (coronary artery disease)   . Hypertension   . Atypical angina   . PVD (peripheral vascular disease)   . Prostate cancer   . Hyperlipidemia   .  COPD (chronic obstructive pulmonary disease)   . TIA (transient ischemic attack) 07/2010  . Myocardial infarction 11/2010  . Anemia   . Stroke   . Anxiety    Past Surgical History  Procedure Laterality Date  . Coronary artery bypass graft      6 years ago  . Carotid endarterectomy      bilateral  . Cardiac catheterization  2009, 2012    stents placed   Family History  Problem Relation Age of Onset  . Heart disease Mother   . Stomach cancer Father   . Throat cancer Father   . Heart attack Brother    History   Social History  . Marital Status: Married    Spouse Name: N/A    Number of Children: N/A  . Years of Education: N/A   Occupational History  . Not on file.   Social History Main Topics  . Smoking status: Former Smoker -- 1.50 packs/day for 40 years    Quit date: 08/20/2007  . Smokeless tobacco: Never Used  . Alcohol Use: No  . Drug Use: No  . Sexual Activity: Not on file   Other Topics Concern  . Not on file   Social History Narrative  . No narrative on file    Review of Systems: Review of Systems  Constitutional: Negative for fever.  Eyes: Negative for blurred vision.  Respiratory: Negative for cough and shortness of breath.   Cardiovascular: Negative for chest pain, palpitations and leg swelling.  Gastrointestinal: Negative for nausea, vomiting, abdominal pain, diarrhea, constipation and blood in stool.  Genitourinary: Negative for dysuria.  Musculoskeletal: Negative for falls and myalgias.  Neurological: Positive for weakness and headaches. Negative for dizziness, tingling and loss of consciousness.    Physical Exam: Blood pressure 112/96, pulse 68, temperature 98.6 F (37 C), resp. rate 17, SpO2 94.00%. General: alert, cooperative, and in no apparent distress HEENT: NCAT, vision grossly intact, oropharynx clear and non-erythematous  Neck: supple, no lymphadenopathy, or carotid bruits Lungs: clear to ascultation bilaterally, normal work of  respiration, no wheezes, rales, ronchi Heart: regular rate and rhythm, no murmurs, gallops, or rubs Abdomen: soft, non-tender, non-distended, normal bowel sounds Extremities: 2+ DP/PT pulses bilaterally, no cyanosis, clubbing, or edema Neurologic: alert & oriented X3,  cranial nerves II-XII intact, sensation mildly decreased to touch in LLE Strength 3+/5 in LLE, 4/5 in left hand (possibly with sub-optimal effort); otherwise 5/5 throughout   Lab results: Basic Metabolic Panel:  Recent Labs  16/10/96 1507 06/02/13 1508  NA 141 138  K 3.6 3.9  CL 109 105  CO2  --  20  GLUCOSE 85 82  BUN 17 16  CREATININE 1.50* 1.22  CALCIUM  --  9.0   Liver Function Tests:  Recent Labs  06/02/13 1508  AST 32  ALT 35  ALKPHOS 56  BILITOT 0.5  PROT 7.2  ALBUMIN 3.4*   CBC:  Recent Labs  06/02/13 1507 06/02/13 1508  WBC  --  9.2  NEUTROABS  --  6.6  HGB 12.9* 12.7*  HCT 38.0* 37.3*  MCV  --  87.1  PLT  --  154   Cardiac Enzymes:  Recent Labs  06/02/13 1509  TROPONINI <0.30   CBG:  Recent Labs  06/02/13 1514  GLUCAP 84   Coagulation:  Recent Labs  06/02/13 1352 06/02/13 1508  LABPROT  --  25.6*  INR 2.4 2.43*    Imaging results:  Ct Head (brain) Wo Contrast  06/02/2013   CLINICAL DATA:  Code stroke. Slurred speech  EXAM: CT HEAD WITHOUT CONTRAST  TECHNIQUE: Contiguous axial images were obtained from the base of the skull through the vertex without intravenous contrast.  COMPARISON:  MRI 05/23/2013. CT 04/20/2013  FINDINGS: Generalized atrophy. Chronic microvascular ischemic changes in the white matter. Negative for acute infarct. Negative for hemorrhage or mass. No midline shift. No focal skull abnormality.  IMPRESSION: Atrophy and chronic microvascular ischemia. No acute abnormality.  Critical Value/emergent results were called by telephone at the time of interpretation on 06/02/2013 at 3:11 PM to Dr.Stewart , who verbally acknowledged these results.    Electronically Signed   By: Marlan Palau M.D.   On: 06/02/2013 15:12   Mr Brain Wo Contrast  06/02/2013   CLINICAL DATA:  77 year old male with acute onset left side weakness. Code stroke.  EXAM: MRI HEAD WITHOUT CONTRAST  TECHNIQUE: Multiplanar, multisequence MR imaging was performed. No intravenous contrast was administered.  COMPARISON:  Brain MRI and MRA 05/23/2013 and earlier.  FINDINGS: Diffusion imaging today appears stable and negative. No restricted diffusion or evidence of acute infarction.  Stable cerebral volume. No midline shift, mass effect, or evidence of intracranial mass lesion. No ventriculomegaly. Negative pituitary, cervicomedullary junction and visualized cervical spine. No acute intracranial hemorrhage identified. Major intracranial vascular flow voids are stable. Stable patchy cerebral white matter T2 and FLAIR hyperintensity in a nonspecific configuration. No cortical encephalomalacia. Signal remains within normal limits in the deep gray matter nuclei, brainstem, and cerebellum (except for a tiny chronic micro hemorrhage suspected in the left cerebellum on series 6, image 7, unchanged).  Normal bone marrow signal. Stable orbits soft tissues. Stable paranasal sinuses. Trace left mastoid fluid is new. Negative visualized nasopharynx. Visualized scalp soft tissues are within normal limits.  IMPRESSION: No acute infarct identified. Stable non contrast MRI appearance of the brain.   Electronically Signed   By: Augusto Gamble M.D.   On: 06/02/2013 17:27    Other results: EKG: atrial fibrillation with rate of 87, no ST or T wave changes, similar to prior  Assessment & Plan by Problem: #TIA- Recurrent right subcortical MCA territory TIA (affecting left lower extremity and left hand strength though gradually improving).  Pt has history of extensive vascular disease with right sided CVA  in 04/2013 treated with tPA and near complete resolution of symptoms.  Workup at that time included the  following: CT head and MRI/MRA post-tPA which were negative, carotid dopplers which showed no significant carotid disease.  He was discharged home on ASA.  On 05/23/13, pt again had left arm and left leg weakness.  CT head and MRI did not show acute stroke, EEG negative. He was discharged home on warfarin. This admission, CT head with no acute intracranial abnormality; MRI brain with no acute infarct, no change from prior. Troponin x 1 negative.  INR therapeutic at 2.43. Pt does have significant risk factors for stroke including HTN, HL, atrial fibrillation.  Of note, he is not a candidate for tPA because of recent tPA administration on 04/20/13.  -admit to IMTS telemetry, observation (per neuro since sx not resolved) -neurology consult, appreciate recs -continue home Coumadin and ASA -neuro checks q2h -PT consult -heart healthy diet  #Atrial fibrillation- HR well controlled (70s-80s). On Coumadin with no recent bleeding.   #CAD- Currently with no symptoms of angina. Continue home medication regimen.  #HTN- stable BP 140s-150s/70s-90s.   #COPD- History of severe COPD and frequent exacerbations. Currently stable with no shortness of breath, cough, or sputum production. Continue albuterol, Symbicort, Spiriva inhalers, montelukast.   #DVT PPX- on Coumadin with therapeutic INR; therefore will not order heparin Haugen TID.   Dispo: Disposition is deferred at this time, awaiting improvement of current medical problems. Anticipated discharge in approximately 1 day(s).     The patient does have a current PCP Benay Pike, MD) and does need an [facility] hospital follow-up appointment after discharge.   Signed: Rocco Serene, MD 06/02/2013, 8:13 PM

## 2013-06-02 NOTE — Progress Notes (Signed)
ANTICOAGULATION CONSULT NOTE - Initial Consult  Pharmacy Consult for Coumadin Indication: history of atrial fibrillation  Allergies  Allergen Reactions  . Prednisone Other (See Comments)    Pt gets oral thrush if he doesn't swish and swallow while taking it    Patient Measurements: Height: 6\' 1"  (185.4 cm) (from office visit 05/28/13) Weight: 231 lb 8 oz (105.008 kg) (from office visit 05/28/13) IBW/kg (Calculated) : 79.9   Vital Signs: Temp: 98.6 F (37 C) (10/15 1537) BP: 112/96 mmHg (10/15 1815) Pulse Rate: 68 (10/15 1815)  Labs:  Recent Labs  06/02/13 1352 06/02/13 1507 06/02/13 1508 06/02/13 1509  HGB  --  12.9* 12.7*  --   HCT  --  38.0* 37.3*  --   PLT  --   --  154  --   APTT  --   --  40*  --   LABPROT  --   --  25.6*  --   INR 2.4  --  2.43*  --   CREATININE  --  1.50* 1.22  --   TROPONINI  --   --   --  <0.30    Estimated Creatinine Clearance: 66.5 ml/min (by C-G formula based on Cr of 1.22).   Medical History: Past Medical History  Diagnosis Date  . CAD (coronary artery disease)   . Hypertension   . Atypical angina   . PVD (peripheral vascular disease)   . Prostate cancer   . Hyperlipidemia   . COPD (chronic obstructive pulmonary disease)   . TIA (transient ischemic attack) 07/2010  . Myocardial infarction 11/2010  . Anemia   . Stroke   . Anxiety     Medications:  Prescriptions prior to admission  Medication Sig Dispense Refill  . albuterol (VENTOLIN HFA) 108 (90 BASE) MCG/ACT inhaler Inhale 2 puffs into the lungs every 6 (six) hours as needed for wheezing.      Marland Kitchen ALPRAZolam (XANAX) 0.25 MG tablet Take 0.25 mg by mouth every 6 (six) hours as needed for anxiety.       . Ascorbic Acid (VITAMIN C) 1000 MG tablet Take 1,000 mg by mouth daily.      Marland Kitchen aspirin EC 81 MG tablet Take 81 mg by mouth daily.      . budesonide-formoterol (SYMBICORT) 160-4.5 MCG/ACT inhaler Inhale 2 puffs into the lungs 2 (two) times daily.       Marland Kitchen docusate sodium  (COLACE) 100 MG capsule Take 100 mg by mouth 2 (two) times daily.      . Ferrous Sulfate (IRON) 325 (65 FE) MG TABS Take 325 mg by mouth 2 (two) times daily.       . fludrocortisone (FLORINEF) 0.1 MG tablet Take 1 tablet (0.1 mg total) by mouth daily.  30 tablet  3  . furosemide (LASIX) 20 MG tablet Take 20 mg by mouth daily as needed for fluid or edema.      Marland Kitchen ipratropium-albuterol (DUONEB) 0.5-2.5 (3) MG/3ML SOLN Take 3 mLs by nebulization every 6 (six) hours as needed (for shortness of breath).       Marland Kitchen leuprolide, 6 Month, (ELIGARD) 45 MG injection Inject 45 mg into the skin every 6 (six) months.      . montelukast (SINGULAIR) 10 MG tablet Take 10 mg by mouth at bedtime.       . nitroGLYCERIN (NITROSTAT) 0.4 MG SL tablet Place 0.4 mg under the tongue every 5 (five) minutes as needed for chest pain.      Marland Kitchen omega-3  acid ethyl esters (LOVAZA) 1 G capsule Take 1 g by mouth 2 (two) times daily.       Marland Kitchen omeprazole (PRILOSEC) 20 MG capsule Take 20 mg by mouth daily.       . potassium chloride (K-DUR) 10 MEQ tablet Take 10 mEq by mouth daily as needed (takes with lasix).      . rosuvastatin (CRESTOR) 20 MG tablet Take 20 mg by mouth every evening.       . senna (SENOKOT) 8.6 MG tablet Take 1 tablet by mouth daily.      . sertraline (ZOLOFT) 50 MG tablet Take 50 mg by mouth daily.      Marland Kitchen tiotropium (SPIRIVA) 18 MCG inhalation capsule Place 18 mcg into inhaler and inhale daily.       Marland Kitchen topiramate (TOPAMAX) 25 MG capsule Take 25 mg by mouth daily.       Marland Kitchen warfarin (COUMADIN) 3 MG tablet Take 1.5-3 mg by mouth daily. 3mg  Monday, Wednesday, Friday; 1.5mg  the rest of the week       Scheduled:  . [START ON 06/03/2013] aspirin EC  81 mg Oral Daily  . atorvastatin  40 mg Oral q1800  . budesonide-formoterol  2 puff Inhalation BID  . docusate sodium  100 mg Oral BID  . ferrous sulfate  325 mg Oral BID  . [START ON 06/03/2013] fludrocortisone  0.1 mg Oral Daily  . montelukast  10 mg Oral QHS  . omega-3 acid  ethyl esters  1 g Oral BID  . pantoprazole  40 mg Oral Daily  . senna  1 tablet Oral Daily  . sertraline  50 mg Oral Daily  . tiotropium  18 mcg Inhalation Daily  . [START ON 06/03/2013] topiramate  25 mg Oral Daily  . [START ON 06/03/2013] warfarin  1.5 mg Oral Q T,Th,S,Su-1800  . warfarin  3 mg Oral Q M,W,F-1800  . Warfarin - Pharmacist Dosing Inpatient   Does not apply q1800    Assessment: This is a 77 year old male with history of atrial fibrillation on Coumadin and other PMH of HTN, PVD, COPD, HLD, MI and right CVA 1 month ago, presenting today 06/02/13 with acute recurrent weakness on the left side as well as transient slurred speech. INR 2.43 on PTA dose of 3mg  qMWF and 1.5 mg qTTSS-last taken yesterday, 06/01/13.   TPA given 6 weeks ago with his previous stroke prior to being transferred here for further treatment. Is not a candidate for TPA because of the recent thrombolytic management with TPA, as well as INR greater than 1.8.   MD notes patient previously found to be resistant to Plavix by P2Y12 assay at [facility].  Dr. Aundria Rud discussed pradaxa or eliquis with patient/family and MD notes patient previously had a bleed on xarelto. Patient is reluctant to start one of the other 2. He is uncomfortable with having no reversal agent given his history of bleeding. discuss pradaxa or eliquis. No recent bleeding noted.  INR currently therapeutic.    Goal of Therapy:  INR 2-3 Monitor platelets by anticoagulation protocol: Yes   Plan:  Continue home dose of coumadin 3 mg every M-W-F and 1.5 mg every T-Th-Sat-Sun.   Daily PT/INR Monitor for sign/symptoms of bleeding and for drug interactions with coumadin.   Noah Delaine, RPh Clinical Pharmacist Pager: [phone] 06/02/2013,8:25 PM

## 2013-06-02 NOTE — ED Notes (Signed)
MD Wentz at bedside.  

## 2013-06-03 DIAGNOSIS — G459 Transient cerebral ischemic attack, unspecified: Secondary | ICD-10-CM

## 2013-06-03 DIAGNOSIS — I4891 Unspecified atrial fibrillation: Secondary | ICD-10-CM

## 2013-06-03 NOTE — Progress Notes (Signed)
ANTICOAGULATION CONSULT NOTE - follow up  Pharmacy Consult for Coumadin Indication: history of atrial fibrillation  Allergies  Allergen Reactions  . Prednisone Other (See Comments)    Pt gets oral thrush if he doesn't swish and swallow while taking it    Patient Measurements: Height: 6\' 1"  (185.4 cm) Weight: 234 lb (106.142 kg) IBW/kg (Calculated) : 79.9   Vital Signs: Temp: 98.2 F (36.8 C) (10/16 0515) Temp src: Oral (10/16 0316) BP: 148/67 mmHg (10/16 0515) Pulse Rate: 72 (10/16 0515)  Labs:  Recent Labs  06/02/13 1352 06/02/13 1507 06/02/13 1508 06/02/13 1509  HGB  --  12.9* 12.7*  --   HCT  --  38.0* 37.3*  --   PLT  --   --  154  --   APTT  --   --  40*  --   LABPROT  --   --  25.6*  --   INR 2.4  --  2.43*  --   CREATININE  --  1.50* 1.22  --   TROPONINI  --   --   --  <0.30    Estimated Creatinine Clearance: 66.9 ml/min (by C-G formula based on Cr of 1.22).   Assessment: This is a 77 year old male with history of atrial fibrillation on Coumadin.  PMH of HTN, PVD, COPD, HLD, MI and right CVA 1 month ago, presenting 06/02/13 with acute recurrent weakness on the left side as well as transient slurred speech. INR 2.43 on PTA dose of 3mg  qMWF and 1.5 mg qTTSS-last taken yesterday, 06/01/13.   TPA given 6 weeks ago with his previous stroke prior to being transferred here for further treatment. Is not a candidate for TPA because of the recent thrombolytic management with TPA, as well as INR greater than 1.8.   MD notes patient previously found to be resistant to Plavix by P2Y12 assay at [facility].  Dr. Aundria Rud discussed pradaxa or eliquis with patient/family and MD notes patient previously had a bleed on xarelto. Patient is reluctant to start one of the other 2. He is uncomfortable with having no reversal agent given his history of bleeding. discuss pradaxa or eliquis. No recent bleeding noted.  INR not drawn today but drawn x 2 yesterday  = 2.4 and 2.43 - both  therapeutic on home dose.   Goal of Therapy:  INR 2-3 Monitor platelets by anticoagulation protocol: Yes   Plan:  Continue home dose of coumadin 3 mg every M-W-F and 1.5 mg every T-Th-Sat-Sun.   Daily PT/INR Monitor for sign/symptoms of bleeding and for drug interactions with coumadin.  Herby Abraham, Pharm.D. [phone] 06/03/2013 10:18 AM

## 2013-06-03 NOTE — Evaluation (Signed)
Occupational Therapy Evaluation Patient Details Name: Javier [name]. MRN: [id number] DOB: 1936/01/29 Today's Date: 06/03/2013 Time: 4696-2952 OT Time Calculation (min): 24 min  OT Assessment / Plan / Recommendation History of present illness  [name]. is a 77 y.o. male who states that he had sudden onset of weakness in left arm and leg while in his doctor's office today. The weakness has since improved. He presented to the ED as a code stroke. He was seen initially by the hospitalist, who asked me to evaluate him. His head CT was negative. He reports he has had this same symptom 3 different times. He has had a recent stroke evaluation, at this facility.   CT scan head and MRI brain showed no acute intracranial abnormality. NIH stroke score was 7     Clinical Impression   Pt admitted with above.  Continues to demonstrate LUE weakness and decreased functional use.  Per pt report, this is his third hospital admission since September for left sided weakness. Pt states fear about experiencing another "episode" and is worried he will have worse symptoms if it happens again.  Will continue to follow pt for at least one more session to address LUE deficits and provide pt with HEP.  Recommend OPOT to futher progress rehab.    OT Assessment  Patient needs continued OT Services    Follow Up Recommendations  Outpatient OT    Barriers to Discharge      Equipment Recommendations  None recommended by OT    Recommendations for Other Services    Frequency  Min 2X/week    Precautions / Restrictions     Pertinent Vitals/Pain See vitals    ADL  Grooming: Performed;Wash/dry hands;Supervision/safety Where Assessed - Grooming: Unsupported standing Upper Body Bathing: Simulated;Supervision/safety Where Assessed - Upper Body Bathing: Unsupported sitting Lower Body Bathing: Simulated;Min guard Where Assessed - Lower Body Bathing: Unsupported sit to stand Upper Body Dressing:  Performed;Supervision/safety Where Assessed - Upper Body Dressing: Unsupported sitting Lower Body Dressing: Performed;Min guard (donned socks) Where Assessed - Lower Body Dressing: Unsupported sit to stand Toilet Transfer: Simulated;Min guard Toilet Transfer Method: Sit to Barista:  (bed) Equipment Used: Gait belt;Rolling walker Transfers/Ambulation Related to ADLs: Min guard with RW ADL Comments: Incr time and effort with ADL tasks due to decreased functional use of LUE.    OT Diagnosis: Hemiplegia dominant side  OT Problem List: Decreased range of motion;Decreased strength;Impaired UE functional use OT Treatment Interventions: Therapeutic exercise;Patient/family education   OT Goals(Current goals can be found in the care plan section) Acute Rehab OT Goals Patient Stated Goal: to find out why this keeps happening to him OT Goal Formulation: With patient Time For Goal Achievement: 06/10/13 Potential to Achieve Goals: Good  Visit Information  Last OT Received On: 06/03/13 Assistance Needed: +1 History of Present Illness:  Javier [name]. is a 77 y.o. male who states that he had sudden onset of weakness in left arm and leg while in his doctor's office today. The weakness has since improved. He presented to the ED as a code stroke. He was seen initially by the hospitalist, who asked me to evaluate him. His head CT was negative. He reports he has had this same symptom 3 different times. He has had a recent stroke evaluation, at this facility.        Prior Functioning     Home Living Family/patient expects to be discharged to:: Private residence Living Arrangements: Spouse/significant other;Children Available  Help at Discharge: Family;Available 24 hours/day Type of Home: House Home Access: Stairs to enter [employer] of Steps: 5 Entrance Stairs-Rails: Right Home Layout: One level Home Equipment: Bedside commode;Grab bars - toilet;Hand held  shower head;Walker - 2 wheels;Shower seat Additional Comments: Per pt report, has been receiving HHPT services and has just recently finished with HHOT services. Prior Function Level of Independence: Independent with assistive device(s) Communication Communication: No difficulties Dominant Hand: Left         Vision/Perception Vision - History Baseline Vision: Wears glasses only for reading Patient Visual Report: No change from baseline   Cognition  Cognition Arousal/Alertness: Awake/alert Behavior During Therapy: WFL for tasks assessed/performed Overall Cognitive Status: Within Functional Limits for tasks assessed    Extremity/Trunk Assessment Upper Extremity Assessment Upper Extremity Assessment: LUE deficits/detail LUE Deficits / Details: [place] 4. Very effortful and tremulous in hand.  LUE Coordination: decreased fine motor;decreased gross motor     Mobility Bed Mobility Bed Mobility: Supine to Sit;Sitting - Scoot to Edge of Bed Supine to Sit: 6: Modified independent (Device/Increase time) Sitting - Scoot to Edge of Bed: 6: Modified independent (Device/Increase time) Transfers Transfers: Sit to Stand;Stand to Sit Sit to Stand: 5: Supervision;From bed Stand to Sit: 5: Supervision;To chair/3-in-1     Exercise     Balance     End of Session OT - End of Session Equipment Utilized During Treatment: Rolling walker Activity Tolerance: Patient tolerated treatment well Patient left: in chair;with call bell/phone within reach Nurse Communication: Mobility status  GO Functional Assessment Tool Used: clinical judgement Functional Limitation: Self care Self Care Current Status (W1191): At least 1 percent but less than 20 percent impaired, limited or restricted Self Care Goal Status [phone]): 0 percent impaired, limited or restricted   06/03/2013 Cipriano Mile OTR/L Pager [phone] Office [phone]  Cipriano Mile 06/03/2013, 10:23 AM

## 2013-06-03 NOTE — Progress Notes (Signed)
Stroke Team Progress Note  HISTORY Javier [name]. is an 77 y.o. male with a history of atrial fibrillation, hypertension, peripheral vascular disease and hyperlipidemia, as well as history of recent stroke and TIA, presenting with recurrent acute weakness and numbness of left extremities. Patient reportedly had slurred speech in route to the hospital, which has cleared. He was given IV TPA about 6 weeks ago for acute right cerebral infarction. He was admitted 10 days ago for TIA. Workup was unremarkable. He was on Coumadin and aspirin at the time which were continued. He was last known well at 2:20 PM 06/02/2013. His INR here today was 2.43. CT scan of his head showed no acute intracranial abnormality. NIH stroke score was 7. Poor effort with manual testing of extremities on the left is again noted. Patient was not a TPA candidate secondary to TPA 6 weeks ago; INR 2.43 . He was admitted for further evaluation and treatment.  SUBJECTIVE No family is at the bedside.  Overall he feels his condition is stable.   OBJECTIVE Most recent Vital Signs: Filed Vitals:   06/02/13 2315 06/03/13 0115 06/03/13 0316 06/03/13 0515  BP: 116/73 135/70 153/90 148/67  Pulse: 75 80 80 72  Temp: 97.5 F (36.4 C) 98.3 F (36.8 C) 98.3 F (36.8 C) 98.2 F (36.8 C)  TempSrc:   Oral   Resp: 20 20 19 18   Height:      Weight:      SpO2: 97% 99% 97% 98%   CBG (last 3)   Recent Labs  06/02/13 1514  GLUCAP 84    IV Fluid Intake:     MEDICATIONS  . aspirin EC  81 mg Oral Daily  . atorvastatin  40 mg Oral q1800  . budesonide-formoterol  2 puff Inhalation BID  . docusate sodium  100 mg Oral BID  . ferrous sulfate  325 mg Oral BID  . fludrocortisone  0.1 mg Oral Daily  . montelukast  10 mg Oral QHS  . omega-3 acid ethyl esters  1 g Oral BID  . pantoprazole  40 mg Oral Daily  . senna  1 tablet Oral Daily  . sertraline  50 mg Oral Daily  . tiotropium  18 mcg Inhalation Daily  . topiramate  25 mg Oral  Daily  . warfarin  1.5 mg Oral Q T,Th,S,Su-1800  . Warfarin - Pharmacist Dosing Inpatient   Does not apply q1800   PRN:  albuterol, ALPRAZolam, furosemide, potassium chloride  Diet:  Cardiac thin liquids Activity:  Bathroom privileges with assistance DVT Prophylaxis:  Therapeutic warfarin  CLINICALLY SIGNIFICANT STUDIES Basic Metabolic Panel:   Recent Labs Lab 06/02/13 1507 06/02/13 1508  NA 141 138  K 3.6 3.9  CL 109 105  CO2  --  20  GLUCOSE 85 82  BUN 17 16  CREATININE 1.50* 1.22  CALCIUM  --  9.0   Liver Function Tests:   Recent Labs Lab 06/02/13 1508  AST 32  ALT 35  ALKPHOS 56  BILITOT 0.5  PROT 7.2  ALBUMIN 3.4*   CBC:   Recent Labs Lab 06/02/13 1507 06/02/13 1508  WBC  --  9.2  NEUTROABS  --  6.6  HGB 12.9* 12.7*  HCT 38.0* 37.3*  MCV  --  87.1  PLT  --  154   Coagulation:   Recent Labs Lab 05/28/13 1436 06/02/13 1352 06/02/13 1508  LABPROT  --   --  25.6*  INR 2.3 2.4 2.43*   Cardiac Enzymes:  Recent Labs Lab 06/02/13 1509  TROPONINI <0.30   Urinalysis: No results found for this basename: COLORURINE, APPERANCEUR, LABSPEC, PHURINE, GLUCOSEU, HGBUR, BILIRUBINUR, KETONESUR, PROTEINUR, UROBILINOGEN, NITRITE, LEUKOCYTESUR,  in the last 168 hours Lipid Panel    Component Value Date/Time   CHOL 136 04/20/2013 0500   TRIG 163* 04/20/2013 0500   HDL 43 04/20/2013 0500   CHOLHDL 3.2 04/20/2013 0500   VLDL 33 04/20/2013 0500   LDLCALC 60 04/20/2013 0500   HgbA1C  Lab Results  Component Value Date   HGBA1C 6.2* 04/20/2013    Urine Drug Screen:     Component Value Date/Time   LABOPIA POSITIVE* 06/02/2013 2200   COCAINSCRNUR NONE DETECTED 06/02/2013 2200   LABBENZ NONE DETECTED 06/02/2013 2200   AMPHETMU NONE DETECTED 06/02/2013 2200   THCU NONE DETECTED 06/02/2013 2200   LABBARB NONE DETECTED 06/02/2013 2200    Alcohol Level: No results found for this basename: ETH,  in the last 168 hours   CT of the brain  06/02/2013   Atrophy and  chronic microvascular ischemia. No acute abnormality.   MRI of the brain  06/02/2013   No acute infarct identified. Stable non contrast MRI appearance of the brain.   MRA of the brain    2D Echocardiogram  05/08/2013 EF 60-65% with no source of embolus.   Carotid Doppler  05/08/2013  No evidence of hemodynamically significant internal carotid artery stenosis. Vertebral artery flow is antegrade.   CXR    EKG  Final. normal sinus rhythm.   Therapy Recommendations OP OT  Physical Exam   Elderly Caucasian male not in distress.Awake alert. Afebrile. Head is nontraumatic. Neck is supple without bruit. Hearing is normal. Cardiac exam no murmur or gallop. Lungs are clear to auscultation. Distal pulses are well felt. Neurological Exam : Awake alert oriented x3 normal speech and language function. Vision acuity Is normal. Fundi were not visualized. Is mild left lower facial asymmetry. Tongue midline. 4 persistent exam some mild left-sided weakness but poor effort and variable effort. When testing for upper extremity drift his arm does not pronate but it moves down and he can correct it and it repeatedly goes down and up. Similar mild subjective lower leg weakness. ASSESSMENT [name] is a 77 y.o. male presenting with recurrent left sided weakness - in setting of right brain stroke 6 wks ago and right TIA 10 days ago. He has poor recollection of the event. Imaging confirms  No acute infarct. Dx:  transient weakness, nonorganic vs right brain TIA; no acute stroke.  On aspirin 81 mg orally every day and warfarin prior to admission. Now on aspirin 81 mg orally every day and warfarin for secondary stroke prevention. Patient with resultant left hemiparesis not felt to be organic (there is a drift, but it does not pronate).   TIA admission 10/5-10/7/14 R brain embolic stroke due to afib not seen on MRI s/p IV tPA 9/1-04/26/13 atrial fibrillation, on chronic coumadin Hx hematuria on  xarelto Hyperlipidemia, LDL 60, on statin PTA, at goal LDL < 100 (< 70 for diabetics)  CAD - CABG Vascular disease: s/p bilateral CEA  Hypertension COPD  Hospital day # 1  TREATMENT/PLAN  Continue aspirin 81 mg orally every day and warfarin for secondary stroke prevention.  No further stroke workup indicated.  Should symptoms continue to recur, can consider change in anticoagulant. Patient not interested in change at this time Ongoing risk factor control by Primary Care Physician Stroke Service will sign off.  Please call should any needs arise. Follow up with Dr. Pearlean Brownie, Stroke Clinic, in 2 months - likely scheduled from previous admission   Annie Main, MSN, RN, ANVP-BC, ANP-BC, [facility] Pager: [phone] 06/03/2013 10:42 AM  I have personally obtained a history, examined the patient, evaluated imaging results, and formulated the assessment and plan of care. I agree with the above. Delia Heady, MD

## 2013-06-03 NOTE — Discharge Summary (Signed)
Name: Javier [name]. MRN: [id number] DOB: [birth year] 77 y.o. PCP: Benay Pike, MD  Date of Admission: 06/02/2013  2:47 PM Date of Discharge: 06/03/2013 Attending Physician: Jonah Blue, DO  Discharge Diagnosis: 1. TIA- Recently hospitalized for TIA on 05/23/13 and more importantly CVA in 04/2013, received tPA at that time. Patient presented on the admission for left sided weakness and slurring of speech (since resolved). On Coumadin (h/o A-fib), admission INR was 2.43. NIH Stroke score of 7. 2. MI- s/p CABG and PCI 3. PVD- s/p Carotid Endarterectomy 4. Atrial Fibrillation- On Coumadin 5. HTN 6. COPD  Discharge Medications:   Medication List         ALPRAZolam 0.25 MG tablet  Commonly known as:  XANAX  Take 0.25 mg by mouth every 6 (six) hours as needed for anxiety.     aspirin EC 81 MG tablet  Take 81 mg by mouth daily.     budesonide-formoterol 160-4.5 MCG/ACT inhaler  Commonly known as:  SYMBICORT  Inhale 2 puffs into the lungs 2 (two) times daily.     docusate sodium 100 MG capsule  Commonly known as:  COLACE  Take 100 mg by mouth 2 (two) times daily.     fludrocortisone 0.1 MG tablet  Commonly known as:  FLORINEF  Take 1 tablet (0.1 mg total) by mouth daily.     furosemide 20 MG tablet  Commonly known as:  LASIX  Take 20 mg by mouth daily as needed for fluid or edema.     ipratropium-albuterol 0.5-2.5 (3) MG/3ML Soln  Commonly known as:  DUONEB  Take 3 mLs by nebulization every 6 (six) hours as needed (for shortness of breath).     Iron 325 (65 FE) MG Tabs  Take 325 mg by mouth 2 (two) times daily.     leuprolide (6 Month) 45 MG injection  Commonly known as:  ELIGARD  Inject 45 mg into the skin every 6 (six) months.     montelukast 10 MG tablet  Commonly known as:  SINGULAIR  Take 10 mg by mouth at bedtime.     nitroGLYCERIN 0.4 MG SL tablet  Commonly known as:  NITROSTAT  Place 0.4 mg under the tongue every 5 (five) minutes as needed for  chest pain.     omega-3 acid ethyl esters 1 G capsule  Commonly known as:  LOVAZA  Take 1 g by mouth 2 (two) times daily.     omeprazole 20 MG capsule  Commonly known as:  PRILOSEC  Take 20 mg by mouth daily.     potassium chloride 10 MEQ tablet  Commonly known as:  K-DUR  Take 10 mEq by mouth daily as needed (takes with lasix).     rosuvastatin 20 MG tablet  Commonly known as:  CRESTOR  Take 20 mg by mouth every evening.     senna 8.6 MG tablet  Commonly known as:  SENOKOT  Take 1 tablet by mouth daily.     sertraline 50 MG tablet  Commonly known as:  ZOLOFT  Take 50 mg by mouth daily.     tiotropium 18 MCG inhalation capsule  Commonly known as:  SPIRIVA  Place 18 mcg into inhaler and inhale daily.     topiramate 25 MG capsule  Commonly known as:  TOPAMAX  Take 25 mg by mouth daily.     VENTOLIN HFA 108 (90 BASE) MCG/ACT inhaler  Generic drug:  albuterol  Inhale 2 puffs into the lungs every  6 (six) hours as needed for wheezing.     vitamin C 1000 MG tablet  Take 1,000 mg by mouth daily.     warfarin 3 MG tablet  Commonly known as:  COUMADIN  Take 1.5-3 mg by mouth daily. 3mg  Monday, Wednesday, Friday; 1.5mg  the rest of the week        Disposition and follow-up:   Javier [name]. was discharged from [facility] in stable condition.  At the hospital follow up visit please address:  1. Please address presence of residual weakness. Patient with a h/o bleeding on Xarelto and now a recurrent TIA on Coumadin. Discuss possible other alternatives for anti-coagulation.  2.  Labs / imaging needed at time of follow-up: PT/PTT/INR  3.  Pending labs/ test needing follow-up: none  Follow-up Appointments:     Follow-up Information   Follow up with Gates Rigg, MD On 08/03/2013. (1:45 PM in stroke clinic)    Specialties:  Neurology, Radiology   Contact information:   [address] [place] [address] [phone]        Discharge Instructions: Discharge Orders   Future Appointments Provider Department Dept Phone   08/03/2013 2:00 PM Micki Riley, MD [facility] [phone]   Future Orders Complete By Expires   Call MD for:  difficulty breathing, headache or visual disturbances  As directed    Call MD for:  persistant dizziness or light-headedness  As directed    Diet - low sodium heart healthy  As directed    Increase activity slowly  As directed       Consultations:  Neurology  Procedures Performed:  Ct Head (brain) Wo Contrast  06/02/2013   CLINICAL DATA:  Code stroke. Slurred speech  EXAM: CT HEAD WITHOUT CONTRAST  TECHNIQUE: Contiguous axial images were obtained from the base of the skull through the vertex without intravenous contrast.  COMPARISON:  MRI 05/23/2013. CT 04/20/2013  FINDINGS: Generalized atrophy. Chronic microvascular ischemic changes in the white matter. Negative for acute infarct. Negative for hemorrhage or mass. No midline shift. No focal skull abnormality.  IMPRESSION: Atrophy and chronic microvascular ischemia. No acute abnormality.  Critical Value/emergent results were called by telephone at the time of interpretation on 06/02/2013 at 3:11 PM to Dr.Stewart , who verbally acknowledged these results.   Electronically Signed   By: Marlan Palau M.D.   On: 06/02/2013 15:12   Mr Brain Wo Contrast  06/02/2013   CLINICAL DATA:  77 year old male with acute onset left side weakness. Code stroke.  EXAM: MRI HEAD WITHOUT CONTRAST  TECHNIQUE: Multiplanar, multisequence MR imaging was performed. No intravenous contrast was administered.  COMPARISON:  Brain MRI and MRA 05/23/2013 and earlier.  FINDINGS: Diffusion imaging today appears stable and negative. No restricted diffusion or evidence of acute infarction.  Stable cerebral volume. No midline shift, mass effect, or evidence of intracranial mass lesion. No ventriculomegaly. Negative pituitary, cervicomedullary junction  and visualized cervical spine. No acute intracranial hemorrhage identified. Major intracranial vascular flow voids are stable. Stable patchy cerebral white matter T2 and FLAIR hyperintensity in a nonspecific configuration. No cortical encephalomalacia. Signal remains within normal limits in the deep gray matter nuclei, brainstem, and cerebellum (except for a tiny chronic micro hemorrhage suspected in the left cerebellum on series 6, image 7, unchanged).  Normal bone marrow signal. Stable orbits soft tissues. Stable paranasal sinuses. Trace left mastoid fluid is new. Negative visualized nasopharynx. Visualized scalp soft tissues are within normal limits.  IMPRESSION:  No acute infarct identified. Stable non contrast MRI appearance of the brain.   Electronically Signed   By: Augusto Gamble M.D.   On: 06/02/2013 17:27   Mr Brain Wo Contrast  05/23/2013   CLINICAL DATA:  Left-sided weakness, now resolved. Stroke risk factors include hypertension, hyperlipidemia, and atrial fibrillation.  Recent admission for right brain stroke not seen on MRI, treated with tPA and resolved. Now being treated with warfarin.  EXAM: MRI HEAD WITHOUT CONTRAST  MRA HEAD WITHOUT CONTRAST  TECHNIQUE: Multiplanar, multiecho pulse sequences of the brain and surrounding structures were obtained without intravenous contrast. Angiographic images of the head were obtained using MRA technique without contrast.  COMPARISON:  MRI/MRA at [facility], 04/21/2013. Previous CT head 04/20/2013.  FINDINGS: MRI HEAD FINDINGS  Moderate atrophy. Chronic microvascular ischemic change. No large vessel infarct or chronic hemorrhage. Flow voids are maintained. No midline abnormality. Unremarkable osseous structures. Negative orbits, sinuses, mastoids, and extracranial soft tissues. Similar appearance to priors.  MRA HEAD FINDINGS  The internal carotid arteries are widely patent. The basilar artery is widely patent with vertebrals co-dominant. No intracranial stenosis or  aneurysm. No change from priors.  IMPRESSION: MRI HEAD IMPRESSION  Chronic changes as described. No acute intracranial findings. No acute stroke is evident. No acute or chronic hemorrhage.  MRA HEAD IMPRESSION  Unremarkable MRA intracranial circulation.   Electronically Signed   By: Davonna Belling M.D.   On: 05/23/2013 11:29   Mr Maxine Glenn Head/brain Wo Cm  05/23/2013   CLINICAL DATA:  Left-sided weakness, now resolved. Stroke risk factors include hypertension, hyperlipidemia, and atrial fibrillation.  Recent admission for right brain stroke not seen on MRI, treated with tPA and resolved. Now being treated with warfarin.  EXAM: MRI HEAD WITHOUT CONTRAST  MRA HEAD WITHOUT CONTRAST  TECHNIQUE: Multiplanar, multiecho pulse sequences of the brain and surrounding structures were obtained without intravenous contrast. Angiographic images of the head were obtained using MRA technique without contrast.  COMPARISON:  MRI/MRA at [facility], 04/21/2013. Previous CT head 04/20/2013.  FINDINGS: MRI HEAD FINDINGS  Moderate atrophy. Chronic microvascular ischemic change. No large vessel infarct or chronic hemorrhage. Flow voids are maintained. No midline abnormality. Unremarkable osseous structures. Negative orbits, sinuses, mastoids, and extracranial soft tissues. Similar appearance to priors.  MRA HEAD FINDINGS  The internal carotid arteries are widely patent. The basilar artery is widely patent with vertebrals co-dominant. No intracranial stenosis or aneurysm. No change from priors.  IMPRESSION: MRI HEAD IMPRESSION  Chronic changes as described. No acute intracranial findings. No acute stroke is evident. No acute or chronic hemorrhage.  MRA HEAD IMPRESSION  Unremarkable MRA intracranial circulation.   Electronically Signed   By: Davonna Belling M.D.   On: 05/23/2013 11:29    2D Echo: 04/20/13 Study Conclusions: - Left ventricle: The cavity size was normal. Wall thickness was normal. Systolic function was normal. The estimated ejection  fraction was in the range of 60% to 65%. Wall motion was normal; there were no regional wall motion abnormalities. - Left atrium: The atrium was mildly dilated. - Right atrium: The atrium was mildly dilated.  Admission HPI:  Mr. Mccarney is a 77 year old man with history of vascular disease with right CVA in 04/2013 s/p tPA as well as TIA on 05/23/13, MI s/p CABG x3 and PCI x4, PVD s/p bilateral CEA in 2000, atrial fibrillation on Coumadin, HTN, HL, COPD, tobacco abuse (quit in 2013) presenting with acute recurrent weakness on left side as well as transient slurred speech.  Patient states he was at Coumadin clinic this afternoon, was unable to get out of chair due to leg weakness; last normal around 2:15pm. He reportedly also had slurred speech en route to the hospital, which has since resolved. He has residual left leg weakness which is improving as well as left hand weakness which he thinks is improving but more slowly. He had been feeling like himself since his last hospitalization on 10/5 for TIA. Workup at that hospitalization was unremarkable (see below). He was on Coumadin and ASA at the time which were continued. Patient reports good compliance with all of his medications. He was also given IV tPA about 6 weeks ago for acute right cerebral infarction.  INR today was 2.43. CT scan head and MRI brain showed no acute intracranial abnormality. NIH stroke score was 7.   Hospital Course by problem list: Principal Problem:   TIA (transient ischemic attack) Active Problems:   HYPERLIPIDEMIA-MIXED   CAD, ARTERY BYPASS GRAFT   COPD   Hypertension   Atrial fibrillation   Orthostatic hypotension   1. TIA- Patient presented to the ED on 06/02/13 after being in the Coumadin clinic earlier and was unable to rise from the chair 2/2 LLE weakness and numbness with accompanying UE weakness and slurring of speech. The speech difficulties resolved prior to arrival on the floor. CT and MRI were performed, both  which showed no acute abnormalities or infarct. Patient was seen by neurology and the recommendations were for MRI (results discussed above) and no changes in anticoagulation regimen. INR was 2.43 and patient had been compliant with ASA and Coumadin. Mr. Roskos was admitted because his weakness continued. No significant changes in management were performed. Neurology discussed with the patient that if further TIA symptoms recur, anticoagulation regimen should be changed. The patient was also recently hospitalized for TIA on 05/23/13 as well as CVA in 04/2013 (R. Embolic stroke d/t A-fib) where he received tPA at that time. Workup during his most recent hospitalization was unremarkable.  2. CAD- s/p CABG and PCI. CAD stable during this admission. No symptoms of angina. He was continued on his home medication regimen (ASA, Lipitor, NTG)   3. PVD- s/p Carotid Endarterectomy. No issues during this admission. Previous Carotid Doppler in 04/2013 showed 1-39% stenosis bilaterally.    4. Atrial Fibrillation- Currently on Coumadin for anticoagulation. Had previous CVA in 04/2013 (described above) most likely d/t a-fib/cardiac emboli. Rate well controlled (70s-80s). No issues during this admission.  5. HTN- Stable and well controlled, 140s-150s/70s-90s.   6. COPD- Mr. Myrie has a history of severe COPD with frequent exacerbations. He is currently on Albuterol, Symbicort, Spiriva, and Montelukast. No issues during this admission.  Discharge Vitals:   BP 145/99  Pulse 78  Temp(Src) 97.9 F (36.6 C) (Oral)  Resp 18  Ht 6\' 1"  (1.854 m)  Wt 234 lb (106.142 kg)  BMI 30.88 kg/m2  SpO2 97%  Discharge Labs:  Results for orders placed during the hospital encounter of 06/02/13 (from the past 24 hour(s))  POCT I-STAT TROPONIN I     Status: None   Collection Time    06/02/13  3:05 PM      Result Value Range   Troponin i, poc 0.02  0.00 - 0.08 ng/mL   Comment 3           POCT I-STAT, CHEM 8     Status:  Abnormal   Collection Time    06/02/13  3:07 PM  Result Value Range   Sodium 141  135 - 145 mEq/L   Potassium 3.6  3.5 - 5.1 mEq/L   Chloride 109  96 - 112 mEq/L   BUN 17  6 - 23 mg/dL   Creatinine, Ser 0.98 (*) 0.50 - 1.35 mg/dL   Glucose, Bld 85  70 - 99 mg/dL   Calcium, Ion 1.19  1.47 - 1.30 mmol/L   TCO2 20  0 - 100 mmol/L   Hemoglobin 12.9 (*) 13.0 - 17.0 g/dL   HCT 82.9 (*) 56.2 - 13.0 %  PROTIME-INR     Status: Abnormal   Collection Time    06/02/13  3:08 PM      Result Value Range   Prothrombin Time 25.6 (*) 11.6 - 15.2 seconds   INR 2.43 (*) 0.00 - 1.49  APTT     Status: Abnormal   Collection Time    06/02/13  3:08 PM      Result Value Range   aPTT 40 (*) 24 - 37 seconds  CBC     Status: Abnormal   Collection Time    06/02/13  3:08 PM      Result Value Range   WBC 9.2  4.0 - 10.5 K/uL   RBC 4.28  4.22 - 5.81 MIL/uL   Hemoglobin 12.7 (*) 13.0 - 17.0 g/dL   HCT 86.5 (*) 78.4 - 69.6 %   MCV 87.1  78.0 - 100.0 fL   MCH 29.7  26.0 - 34.0 pg   MCHC 34.0  30.0 - 36.0 g/dL   RDW 29.5  28.4 - 13.2 %   Platelets 154  150 - 400 K/uL  DIFFERENTIAL     Status: None   Collection Time    06/02/13  3:08 PM      Result Value Range   Neutrophils Relative % 72  43 - 77 %   Neutro Abs 6.6  1.7 - 7.7 K/uL   Lymphocytes Relative 20  12 - 46 %   Lymphs Abs 1.8  0.7 - 4.0 K/uL   Monocytes Relative 7  3 - 12 %   Monocytes Absolute 0.7  0.1 - 1.0 K/uL   Eosinophils Relative 1  0 - 5 %   Eosinophils Absolute 0.1  0.0 - 0.7 K/uL   Basophils Relative 0  0 - 1 %   Basophils Absolute 0.0  0.0 - 0.1 K/uL  COMPREHENSIVE METABOLIC PANEL     Status: Abnormal   Collection Time    06/02/13  3:08 PM      Result Value Range   Sodium 138  135 - 145 mEq/L   Potassium 3.9  3.5 - 5.1 mEq/L   Chloride 105  96 - 112 mEq/L   CO2 20  19 - 32 mEq/L   Glucose, Bld 82  70 - 99 mg/dL   BUN 16  6 - 23 mg/dL   Creatinine, Ser 4.40  0.50 - 1.35 mg/dL   Calcium 9.0  8.4 - 10.2 mg/dL   Total  Protein 7.2  6.0 - 8.3 g/dL   Albumin 3.4 (*) 3.5 - 5.2 g/dL   AST 32  0 - 37 U/L   ALT 35  0 - 53 U/L   Alkaline Phosphatase 56  39 - 117 U/L   Total Bilirubin 0.5  0.3 - 1.2 mg/dL   GFR calc non Af Amer 56 (*) >90 mL/min   GFR calc Af Amer 65 (*) >90 mL/min  TROPONIN I  Status: None   Collection Time    06/02/13  3:09 PM      Result Value Range   Troponin I <0.30  <0.30 ng/mL  GLUCOSE, CAPILLARY     Status: None   Collection Time    06/02/13  3:14 PM      Result Value Range   Glucose-Capillary 84  70 - 99 mg/dL  URINE RAPID DRUG SCREEN (HOSP PERFORMED)     Status: Abnormal   Collection Time    06/02/13 10:00 PM      Result Value Range   Opiates POSITIVE (*) NONE DETECTED   Cocaine NONE DETECTED  NONE DETECTED   Benzodiazepines NONE DETECTED  NONE DETECTED   Amphetamines NONE DETECTED  NONE DETECTED   Tetrahydrocannabinol NONE DETECTED  NONE DETECTED   Barbiturates NONE DETECTED  NONE DETECTED  GLUCOSE, CAPILLARY     Status: Abnormal   Collection Time    06/03/13 11:40 AM      Result Value Range   Glucose-Capillary 102 (*) 70 - 99 mg/dL    Signed: Courtney Paris, MD 06/03/2013, 2:46 PM   Time Spent on Discharge: 35 minutes Services Ordered on Discharge: none Equipment Ordered on Discharge: none

## 2013-06-03 NOTE — H&P (Signed)
INTERNAL MEDICINE TEACHING SERVICE Attending Admission Note  Date: 06/03/2013  Patient name: Javier [name].  Medical record number: [id number]  Date of birth: 30-Apr-1936    I have seen and evaluated Prudencio Pair. and discussed their care with the Residency Team.    77 yr old male with hx Afib, PVD, HL, HTN, with hx of TIA and CVA s/p IV tPA 6 weeks ago. He is on chronic anticoagulation with INR 2.43. CT of the brain without acute abnormality.  MRI brain without evidence of acute infarct.  Neuro has been consulted.  Would discuss with neurology and cardiology whether he needs an INR between 2.5-3.5 given hx of CVA. At this time, he has residual weakness from prior infarct, but no new infarct.  Discussed risks vs benefits of anticoagulation. PT/OT consult today.    Jonah Blue, DO, FACP Faculty [facility] Internal Medicine Residency Program 06/03/2013, 10:53 AM

## 2013-06-03 NOTE — Progress Notes (Signed)
Subjective: [name] is a 77 y.o. male w/ PMHx of vascular disease with right CVA in 04/2013 s/p tPA as well as TIA on 05/23/13, MI s/p CABG x3 and PCI x4, PVD s/p bilateral CEA in 2000, atrial fibrillation on Coumadin, HTN, HL, COPD, tobacco abuse (quit in 2013) presenting with acute recurrent weakness on left side as well as transient slurred speech.   Feeling better today, still with left-sided weakness on exam but improved from before, according to the patient. No further complaints, no slurred speech.  Objective: Vital signs in last 24 hours: Filed Vitals:   06/03/13 0115 06/03/13 0316 06/03/13 0515 06/03/13 1000  BP: 135/70 153/90 148/67 121/64  Pulse: 80 80 72 74  Temp: 98.3 F (36.8 C) 98.3 F (36.8 C) 98.2 F (36.8 C) 97.3 F (36.3 C)  TempSrc:  Oral  Oral  Resp: 20 19 18 18   Height:      Weight:      SpO2: 99% 97% 98% 97%   Weight change:   Intake/Output Summary (Last 24 hours) at 06/03/13 1131 Last data filed at 06/03/13 0700  Gross per 24 hour  Intake    240 ml  Output    750 ml  Net   -510 ml   Physical Exam: General: Alert, cooperative, and in no apparent distress HEENT: Vision grossly intact, oropharynx clear and non-erythematous  Neck: Full range of motion without pain, supple, no lymphadenopathy or carotid bruits Lungs: Clear to ascultation bilaterally, normal work of respiration, no wheezes, rales, ronchi Heart: Regular rate and rhythm, no murmurs, gallops, or rubs Abdomen: Soft, non-tender, non-distended, normal bowel sounds Extremities: No cyanosis, clubbing, or edema Neurologic: Alert & oriented X3, cranial nerves II-XII intact, strength 3-4/5 in LUE and LLE, 5/5 in RUE and RLE. Sensation intact to light touch  Lab Results: Basic Metabolic Panel:  Recent Labs Lab 06/02/13 1507 06/02/13 1508  NA 141 138  K 3.6 3.9  CL 109 105  CO2  --  20  GLUCOSE 85 82  BUN 17 16  CREATININE 1.50* 1.22  CALCIUM  --  9.0   Liver Function  Tests:  Recent Labs Lab 06/02/13 1508  AST 32  ALT 35  ALKPHOS 56  BILITOT 0.5  PROT 7.2  ALBUMIN 3.4*   CBC:  Recent Labs Lab 06/02/13 1507 06/02/13 1508  WBC  --  9.2  NEUTROABS  --  6.6  HGB 12.9* 12.7*  HCT 38.0* 37.3*  MCV  --  87.1  PLT  --  154   Cardiac Enzymes:  Recent Labs Lab 06/02/13 1509  TROPONINI <0.30   CBG:  Recent Labs Lab 06/02/13 1514  GLUCAP 84   Coagulation:  Recent Labs Lab 05/28/13 1436 06/02/13 1352 06/02/13 1508  LABPROT  --   --  25.6*  INR 2.3 2.4 2.43*   Anemia Panel: No results found for this basename: VITAMINB12, FOLATE, FERRITIN, TIBC, IRON, RETICCTPCT,  in the last 168 hours Urine Drug Screen: Drugs of Abuse     Component Value Date/Time   LABOPIA POSITIVE* 06/02/2013 2200   COCAINSCRNUR NONE DETECTED 06/02/2013 2200   LABBENZ NONE DETECTED 06/02/2013 2200   AMPHETMU NONE DETECTED 06/02/2013 2200   THCU NONE DETECTED 06/02/2013 2200   LABBARB NONE DETECTED 06/02/2013 2200   Micro Results: No results found for this or any previous visit (from the past 240 hour(s)). Studies/Results: Ct Head (brain) Wo Contrast  06/02/2013   CLINICAL DATA:  Code stroke. Slurred speech  EXAM: CT HEAD WITHOUT CONTRAST  TECHNIQUE: Contiguous axial images were obtained from the base of the skull through the vertex without intravenous contrast.  COMPARISON:  MRI 05/23/2013. CT 04/20/2013  FINDINGS: Generalized atrophy. Chronic microvascular ischemic changes in the white matter. Negative for acute infarct. Negative for hemorrhage or mass. No midline shift. No focal skull abnormality.  IMPRESSION: Atrophy and chronic microvascular ischemia. No acute abnormality.  Critical Value/emergent results were called by telephone at the time of interpretation on 06/02/2013 at 3:11 PM to Dr.Stewart , who verbally acknowledged these results.   Electronically Signed   By: Marlan Palau M.D.   On: 06/02/2013 15:12   Mr Brain Wo Contrast  06/02/2013    CLINICAL DATA:  77 year old male with acute onset left side weakness. Code stroke.  EXAM: MRI HEAD WITHOUT CONTRAST  TECHNIQUE: Multiplanar, multisequence MR imaging was performed. No intravenous contrast was administered.  COMPARISON:  Brain MRI and MRA 05/23/2013 and earlier.  FINDINGS: Diffusion imaging today appears stable and negative. No restricted diffusion or evidence of acute infarction.  Stable cerebral volume. No midline shift, mass effect, or evidence of intracranial mass lesion. No ventriculomegaly. Negative pituitary, cervicomedullary junction and visualized cervical spine. No acute intracranial hemorrhage identified. Major intracranial vascular flow voids are stable. Stable patchy cerebral white matter T2 and FLAIR hyperintensity in a nonspecific configuration. No cortical encephalomalacia. Signal remains within normal limits in the deep gray matter nuclei, brainstem, and cerebellum (except for a tiny chronic micro hemorrhage suspected in the left cerebellum on series 6, image 7, unchanged).  Normal bone marrow signal. Stable orbits soft tissues. Stable paranasal sinuses. Trace left mastoid fluid is new. Negative visualized nasopharynx. Visualized scalp soft tissues are within normal limits.  IMPRESSION: No acute infarct identified. Stable non contrast MRI appearance of the brain.   Electronically Signed   By: Augusto Gamble M.D.   On: 06/02/2013 17:27   Medications: I have reviewed the patient's current medications. Scheduled Meds: . aspirin EC  81 mg Oral Daily  . atorvastatin  40 mg Oral q1800  . budesonide-formoterol  2 puff Inhalation BID  . docusate sodium  100 mg Oral BID  . ferrous sulfate  325 mg Oral BID  . fludrocortisone  0.1 mg Oral Daily  . montelukast  10 mg Oral QHS  . omega-3 acid ethyl esters  1 g Oral BID  . pantoprazole  40 mg Oral Daily  . senna  1 tablet Oral Daily  . sertraline  50 mg Oral Daily  . tiotropium  18 mcg Inhalation Daily  . topiramate  25 mg Oral Daily    . warfarin  1.5 mg Oral Q T,Th,S,Su-1800  . Warfarin - Pharmacist Dosing Inpatient   Does not apply q1800   Continuous Infusions:  PRN Meds:.albuterol, ALPRAZolam, furosemide, potassium chloride  Assessment/Plan: [name] is a 77 y.o. male w/ PMHx of vascular disease with right CVA in 04/2013 s/p tPA as well as TIA on 05/23/13, MI s/p CABG x3 and PCI x4, PVD s/p bilateral CEA in 2000, atrial fibrillation on Coumadin, HTN, HL, COPD, tobacco abuse (quit in 2013) presenting with acute recurrent weakness on left side as well as transient slurred speech.   #TIA- Recurrent right subcortical MCA territory TIA (affecting left lower extremity and left hand strength though gradually improving). Pt has history of extensive vascular disease with right sided CVA in 04/2013 treated with tPA and near complete resolution of symptoms. Workup at that time included the following: CT  head and MRI/MRA post-tPA which were negative, carotid dopplers which showed no significant carotid disease. He was discharged home on ASA. On 05/23/13, pt again had left arm and left leg weakness. CT head and MRI did not show acute stroke, EEG negative. He was discharged home on warfarin.  This admission, CT head with no acute intracranial abnormality; MRI brain with no acute infarct, no change from prior. Troponin x 1 negative. INR therapeutic at 2.43. Pt does have significant risk factors for stroke including HTN, HL, atrial fibrillation.  -neurology consult, appreciate recs  -continue home Coumadin and ASA  -neuro checks q2h  -PT consult suggests home health PT -heart healthy diet   #Atrial fibrillation- HR well controlled (70s-80s). On Coumadin with no recent bleeding. Patient claims he has tried Xarelto in the past and he had issues with significant bleeding (bowel, bladder).  #CAD- Currently with no symptoms of angina. Continue home medication regimen.   #HTN- stable BP 140s-150s/70s-90s.   #COPD- History of  severe COPD and frequent exacerbations. Currently stable with no shortness of breath, cough, or sputum production. Continue albuterol, Symbicort, Spiriva inhalers, montelukast.   #DVT PPX- on Coumadin with therapeutic INR; therefore will not order heparin [place] [facility].  Dispo: Disposition is deferred at this time, awaiting improvement of current medical problems.  Anticipated discharge today.  The patient does have a current PCP Benay Pike, MD) and does not need an [facility] hospital follow-up appointment after discharge.  The patient does not have transportation limitations that hinder transportation to clinic appointments.  .Services Needed at time of discharge: Y = Yes, Blank = No PT:   OT:   RN:   Equipment:   Other:     LOS: 1 day   Courtney Paris, MD 06/03/2013, 11:31 AM Pager: [phone]

## 2013-06-03 NOTE — Evaluation (Addendum)
Physical Therapy Evaluation Patient Details Name: Javier [name]. MRN: [id number] DOB: 11/08/1935 Today's Date: 06/03/2013 Time: 4098-1191 PT Time Calculation (min): 16 min  PT Assessment / Plan / Recommendation History of Present Illness   Javier [name]. is a 77 y.o. male who states that he had sudden onset of weakness in left arm and leg while in his doctor's office today. The weakness has since improved. He presented to the ED as a code stroke. He was seen initially by the hospitalist, who asked me to evaluate him. His head CT was negative. He reports he has had this same symptom 3 different times. He has had a recent stroke evaluation, at this facility.   Clinical Impression  Patient demonstrates deficits in functional mobility as indicated below. Pt will benefit from continued skilled PT to address deficits and maximize function. Will continue to see as indicated and progress activity as tolerated. Rec resumption of HHPT upon discharge.     PT Assessment  Patient needs continued PT services    Follow Up Recommendations  Home health PT;Supervision - Intermittent (resume HHPT that was in place [facility] with [facility])    Does the patient have the potential to tolerate intense rehabilitation      Barriers to Discharge        Equipment Recommendations  None recommended by PT    Recommendations for Other Services     Frequency Min 4X/week    Precautions / Restrictions Precautions Precautions: None Restrictions Weight Bearing Restrictions: No   Pertinent Vitals/Pain Patient reports no pain      Mobility  Bed Mobility Bed Mobility: Supine to Sit;Sitting - Scoot to Edge of Bed Supine to Sit: 6: Modified independent (Device/Increase time) Sitting - Scoot to Edge of Bed: 6: Modified independent (Device/Increase time) Transfers Transfers: Sit to Stand;Stand to Sit Sit to Stand: 5: Supervision;From bed Stand to Sit: 5: Supervision;To  chair/3-in-1 Ambulation/Gait Ambulation/Gait Assistance: 4: Min guard Ambulation Distance (Feet): 110 Feet Assistive device: Rolling walker Ambulation/Gait Assistance Details: Increased fatigue, 2 standing rest breaks, min guard for stability intially with progress to supervision Gait Pattern: Step-through pattern;Shuffle Gait velocity: decreased Modified Rankin (Stroke Patients Only) Pre-Morbid Rankin Score: Moderately severe disability Modified Rankin: Moderately severe disability        PT Diagnosis: Difficulty walking;Abnormality of gait;Generalized weakness;Hemiplegia dominant side  PT Problem List: Decreased strength;Decreased activity tolerance;Decreased balance;Decreased mobility;Obesity PT Treatment Interventions: DME instruction;Gait training;Stair training;Functional mobility training;Therapeutic activities;Therapeutic exercise;Balance training;Patient/family education     PT Goals(Current goals can be found in the care plan section) Acute Rehab PT Goals Patient Stated Goal: to find out why this keeps happening to him PT Goal Formulation: With patient Time For Goal Achievement: 06/17/13 Potential to Achieve Goals: Good  Visit Information  Last PT Received On: 06/03/13 Assistance Needed: +1 History of Present Illness:  Javier [name]. is a 77 y.o. male who states that he had sudden onset of weakness in left arm and leg while in his doctor's office today. The weakness has since improved. He presented to the ED as a code stroke. He was seen initially by the hospitalist, who asked me to evaluate him. His head CT was negative. He reports he has had this same symptom 3 different times. He has had a recent stroke evaluation, at this facility.        Prior Functioning  Home Living Family/patient expects to be discharged to:: Private residence Living Arrangements: Spouse/significant other;Children Available Help at Discharge: Family;Available 24 hours/day Type  of Home:  House Home Access: Stairs to enter [employer] of Steps: 5 Entrance Stairs-Rails: Right Home Layout: One level Home Equipment: Bedside commode;Grab bars - toilet;Hand held shower head;Walker - 2 wheels;Shower seat Additional Comments: Per pt report, has been receiving HHPT services and has just recently finished with HHOT services. Prior Function Level of Independence: Independent with assistive device(s) Communication Communication: No difficulties Dominant Hand: Left    Cognition  Cognition Arousal/Alertness: Awake/alert Behavior During Therapy: WFL for tasks assessed/performed Overall Cognitive Status: Within Functional Limits for tasks assessed    Extremity/Trunk Assessment Upper Extremity Assessment Upper Extremity Assessment: LUE deficits/detail LUE Deficits / Details: [place] 4. Very effortful and tremulous in hand.  LUE Coordination: decreased fine motor;decreased gross motor Lower Extremity Assessment Lower Extremity Assessment: LLE deficits/detail LLE Deficits / Details: weakness all actions 3/5 able to range full but unable to maintain against resistance   Balance Balance Balance Assessed: Yes Static Sitting Balance Static Sitting - Balance Support: Feet unsupported Static Sitting - Level of Assistance: 7: Independent  End of Session PT - End of Session Equipment Utilized During Treatment: Gait belt Activity Tolerance: Patient limited by fatigue Patient left: in chair;with call bell/phone within reach Nurse Communication: Mobility status  GP Functional Assessment Tool Used: clinical judgement Functional Limitation: Mobility: Walking and moving around Mobility: Walking and Moving Around Current Status ([id number]): At least 20 percent but less than 40 percent impaired, limited or restricted Mobility: Walking and Moving Around Goal Status [phone]): At least 1 percent but less than 20 percent impaired, limited or restricted   Javier [name] 06/03/2013,  10:57 AM [name], PT DPT  [phone]

## 2013-06-03 NOTE — Progress Notes (Signed)
Discharge instructions given. Pt verbalized understanding and all questions were answered.  

## 2013-06-06 NOTE — Discharge Summary (Signed)
  Date: 06/06/2013  Patient name: Javier [name].  Medical record number: [id number]  Date of birth: 01-27-1936   This patient has been discussed with the house staff. Please see their note for complete details. I concur with their findings and plan.  Jonah Blue, DO, FACP Faculty [facility] Residency [facility] 06/06/2013, 6:29 PM

## 2013-06-07 ENCOUNTER — Ambulatory Visit (INDEPENDENT_AMBULATORY_CARE_PROVIDER_SITE_OTHER): Payer: Medicare Other | Admitting: Cardiovascular Disease

## 2013-06-07 ENCOUNTER — Encounter: Payer: Self-pay | Admitting: Cardiovascular Disease

## 2013-06-07 VITALS — BP 160/82 | HR 88 | Ht 73.0 in | Wt 229.8 lb

## 2013-06-07 DIAGNOSIS — I4891 Unspecified atrial fibrillation: Secondary | ICD-10-CM

## 2013-06-07 DIAGNOSIS — I25810 Atherosclerosis of coronary artery bypass graft(s) without angina pectoris: Secondary | ICD-10-CM

## 2013-06-07 DIAGNOSIS — G459 Transient cerebral ischemic attack, unspecified: Secondary | ICD-10-CM

## 2013-06-07 DIAGNOSIS — I951 Orthostatic hypotension: Secondary | ICD-10-CM

## 2013-06-07 DIAGNOSIS — J4489 Other specified chronic obstructive pulmonary disease: Secondary | ICD-10-CM

## 2013-06-07 DIAGNOSIS — J449 Chronic obstructive pulmonary disease, unspecified: Secondary | ICD-10-CM

## 2013-06-07 DIAGNOSIS — E785 Hyperlipidemia, unspecified: Secondary | ICD-10-CM

## 2013-06-07 IMAGING — CR DG CHEST 1V PORT
1 series · 1 of 1 positions shown · non-contrast
Comparison: none

REASON FOR EXAM: Shortness of Breath
COMMENTS:

[ap]
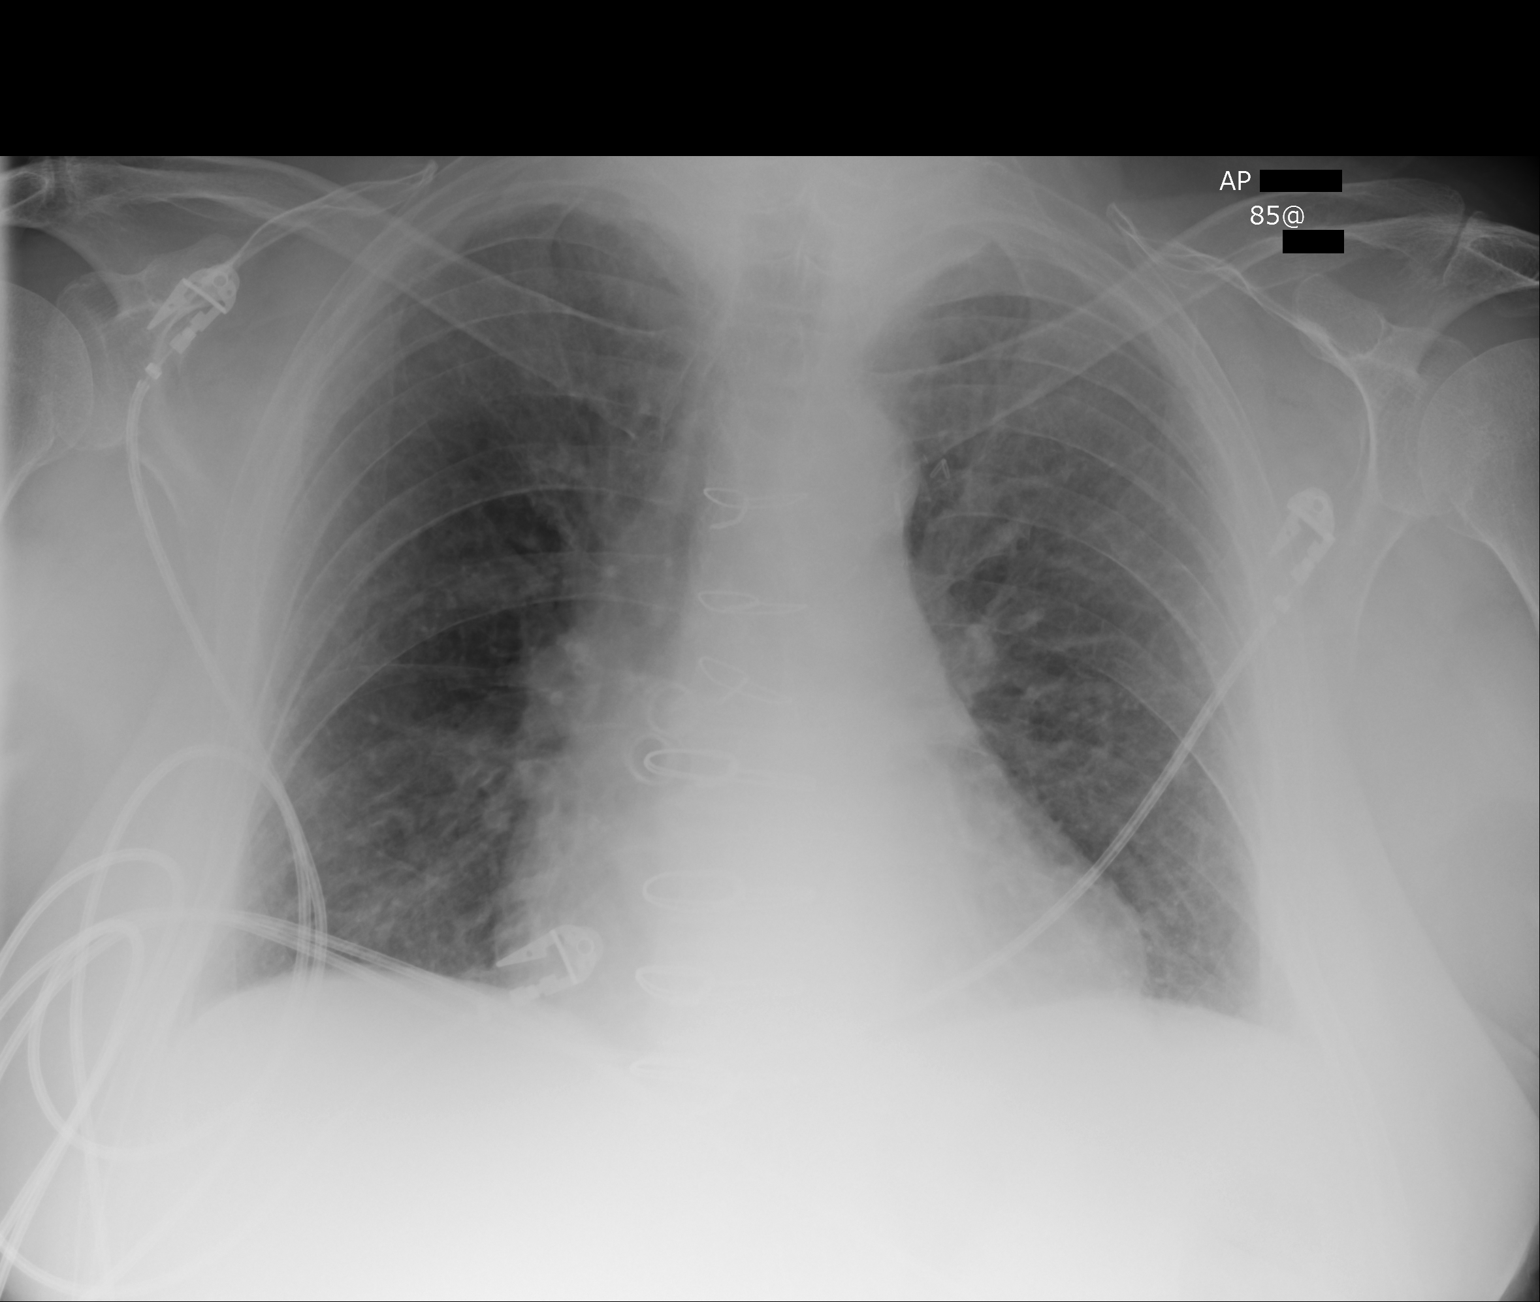

[1 of 1 positions shown; findings below may reference images not displayed]

PROCEDURE:     DXR - DXR PORTABLE CHEST SINGLE VIEW  - November 17, 2012 [DATE]

RESULT:     Comparison is made to the study 08 October, 2012.

CABG changes are present. Cardiac monitoring electrodes are present. The
lungs are clear. The heart and pulmonary vessels are normal. The bony and
mediastinal structures are unremarkable. There is no effusion. There is no
pneumothorax or evidence of congestive failure.
IMPRESSION: No acute cardiopulmonary disease.

[REDACTED]

## 2013-06-07 NOTE — Assessment & Plan Note (Signed)
Long hx of chronic bronchitis. Stable at this time.

## 2013-06-07 NOTE — Progress Notes (Signed)
Patient ID: Javier [name]., male    DOB: [birth year], 77 y.o.   MRN: [id number]  HPI Comments: 77 year old male with history of smoking 40 years, severe asymmetric LVH with moderate LVOT gradient,  COPD, coronary artery disease with bypass surgery in 2007, occlusion of 2 vein grafts with PCI x4, peripheral vascular disease with bilateral carotid endarterectomies in 2002, history of TIA who has presented several times to [facility] with COPD exacerbation,  TIA, frequent episodse of COPD exacerbation/bronchitis often needing long courses of antibiotics and steroids , most recent cardiac catheterization at [facility] showing patent vessels with no significant progression of his disease, who presents for routine followup . He was previously found to be resistant to Plavix by P2Y12 assay  at [facility]   recent TIA. On October 5, he woke in the middle of the night with left arm and left leg weakness. He called 911 and was initially evaluated at [facility], and transferred to [place]. CT scan of the head, MRI did not show an acute stroke. EEG by his report was negative. He was discharged home on warfarin at the same dose. It was noted that INR was 1.79 which was below his typical baseline in the 2 range. He was discharged on 05/25/2013.  he had a full recovery.  Repeat TIA while in our office on 06/02/2013 during his INR check. He was sent to [facility] with Code [place] protocol, Released after CT and head MRI.  In follow up today, he reports he is almost back to his baseline. His left arm/hand still has mild residual weakness.    hospitalization in May 2013 chest pain, COPD exacerbation. Stress test showed no ischemia.  Echocardiogram was essentially normal with mild MR. received significant IV fluids through his hospital course.  Edema got worse through the next week with significant pain in his legs. This improved on outpatient diuretics. Lasix held periodically for low blood pressure Found to be very iron deficient, very  anemic. Labs show iron level XVI, hematocrit 24 BNP 400  admission to the hospital 08/31/2012 for shortness of breath. Treated for COPD exacerbation, started on Levaquin IV steroids. CT scan of the chest showing stable pulmonary nodule.   Several recent hospital admissions for orthostatic hypotension, syncope. Previous admission 02/06/2013 Last hospitalized end of July 2014 with discharge July 31 with syncope, orthostatic hypotension. Creatinine was 1.54 with BUN 30 on arrival.  Lasix has been held.   seen by hematology, Dr. Neale Burly, suggested that he have additional testing for platelet aggregation.  He was previously not on warfarin secondary to recent GI bleeds, as well as hematuria. Stroke August 2014 while not on warfarin. He had acute onset mental status changes with garbled speech. He was given TPA on route to [facility], additional workup included carotid ultrasound, head CT scan and MRA. He was discharged on aspirin. He reports near complete resolution of his symptoms.  Prior Carotid ultrasound done in the hospital 01/12/2012 shows no significant carotid disease , repeat study August 2014  EKG shows atrial fibrillation    Outpatient Encounter Prescriptions as of 06/07/2013  Medication Sig Dispense Refill  . albuterol (VENTOLIN HFA) 108 (90 BASE) MCG/ACT inhaler Inhale 2 puffs into the lungs every 6 (six) hours as needed for wheezing.      Marland Kitchen ALPRAZolam (XANAX) 0.25 MG tablet Take 0.25 mg by mouth every 6 (six) hours as needed for anxiety.       . Ascorbic Acid (VITAMIN C) 1000 MG tablet Take 1,000 mg  by mouth daily.      Marland Kitchen aspirin EC 81 MG tablet Take 81 mg by mouth daily.      . budesonide-formoterol (SYMBICORT) 160-4.5 MCG/ACT inhaler Inhale 2 puffs into the lungs 2 (two) times daily.       Marland Kitchen docusate sodium (COLACE) 100 MG capsule Take 100 mg by mouth 2 (two) times daily.      . Ferrous Sulfate (IRON) 325 (65 FE) MG TABS Take 325 mg by mouth 2 (two) times daily.       .  fludrocortisone (FLORINEF) 0.1 MG tablet Take 1 tablet (0.1 mg total) by mouth daily.  30 tablet  3  . furosemide (LASIX) 20 MG tablet Take 20 mg by mouth daily as needed for fluid or edema.      Marland Kitchen ipratropium-albuterol (DUONEB) 0.5-2.5 (3) MG/3ML SOLN Take 3 mLs by nebulization every 6 (six) hours as needed (for shortness of breath).       Marland Kitchen leuprolide, 6 Month, (ELIGARD) 45 MG injection Inject 45 mg into the skin every 6 (six) months.      . montelukast (SINGULAIR) 10 MG tablet Take 10 mg by mouth at bedtime.       . nitroGLYCERIN (NITROSTAT) 0.4 MG SL tablet Place 0.4 mg under the tongue every 5 (five) minutes as needed for chest pain.      Marland Kitchen omega-3 acid ethyl esters (LOVAZA) 1 G capsule Take 1 g by mouth 2 (two) times daily.       Marland Kitchen omeprazole (PRILOSEC) 20 MG capsule Take 20 mg by mouth daily.       . potassium chloride (K-DUR) 10 MEQ tablet Take 10 mEq by mouth daily as needed (takes with lasix).      . rosuvastatin (CRESTOR) 20 MG tablet Take 20 mg by mouth every evening.       . senna (SENOKOT) 8.6 MG tablet Take 1 tablet by mouth daily.      . sertraline (ZOLOFT) 50 MG tablet Take 50 mg by mouth daily.      Marland Kitchen tiotropium (SPIRIVA) 18 MCG inhalation capsule Place 18 mcg into inhaler and inhale daily.       Marland Kitchen warfarin (COUMADIN) 3 MG tablet Take 1.5-3 mg by mouth daily. 3mg  Monday, Wednesday, Friday; 1.5mg  the rest of the week      . [DISCONTINUED] topiramate (TOPAMAX) 25 MG capsule Take 25 mg by mouth daily.        No facility-administered encounter medications on file as of 06/07/2013.     Review of Systems  Constitutional: Negative.   HENT: Negative.   Eyes: Negative.   Respiratory: Negative.   Cardiovascular: Negative.   Gastrointestinal: Negative.   Endocrine: Negative.   Musculoskeletal: Negative.   Skin: Negative.   Allergic/Immunologic: Negative.   Neurological: Negative.        Mild left arm and hand weakness  Hematological: Negative.   Psychiatric/Behavioral:  Negative.   All other systems reviewed and are negative.    BP 160/82  Pulse 88  Ht 6\' 1"  (1.854 m)  Wt 229 lb 12 oz (104.214 kg)  BMI 30.32 kg/m2  Physical Exam  Nursing note and vitals reviewed. Constitutional: He is oriented to person, place, and time. He appears well-developed and well-nourished.  HENT:  Head: Normocephalic.  Nose: Nose normal.  Mouth/Throat: Oropharynx is clear and moist.  Eyes: Conjunctivae are normal. Pupils are equal, round, and reactive to light.  Neck: Normal range of motion. Neck supple. No JVD present.  Cardiovascular:  Normal rate, S1 normal, S2 normal and intact distal pulses.  An irregularly irregular rhythm present. Exam reveals no gallop and no friction rub.   Murmur heard.  Crescendo systolic murmur is present with a grade of 2/6  Trace pitting above the sock line bilaterally  Pulmonary/Chest: Effort normal. No respiratory distress. He has decreased breath sounds. He has no wheezes. He has no rales. He exhibits no tenderness.  Abdominal: Soft. Bowel sounds are normal. He exhibits no distension. There is no tenderness.  Musculoskeletal: Normal range of motion. He exhibits no edema and no tenderness.  Lymphadenopathy:    He has no cervical adenopathy.  Neurological: He is alert and oriented to person, place, and time. Coordination normal.  Skin: Skin is warm and dry. No rash noted. No erythema.  Psychiatric: He has a normal mood and affect. His behavior is normal. Judgment and thought content normal.      Assessment and Plan

## 2013-06-07 NOTE — Assessment & Plan Note (Signed)
Recent CVA (off warfarin) and several recent TIAs with INR 1.79 and 2.4. We have suggested he increase his warfarin dose with goal INR  range  2.4 to 3 He will take warfarin 3mg  four times a week, 1.5 mg on three days a week, recheck on 06/16/13

## 2013-06-07 NOTE — Assessment & Plan Note (Signed)
Pressures elevated today. Not not change meds in setting of recent TIA.  Previous hx of orthostatic hypotension

## 2013-06-07 NOTE — Patient Instructions (Signed)
You are doing well.  Take warfarin 3 mg tomorrow, Tuesday  Then Please take warfarin 3 mg on Monday, Wednesday, Friday and Sunday,  1.5 mg the other three days (Tuesday, Thursday and Saturday ) Check INR on Wednesday 10/29  Please call us if you have new issues that need to be addressed before your next appt.  Your physician wants you to follow-up in: 3 months.  You will receive a reminder letter in the mail two months in advance. If you don't receive a letter, please call our office to schedule the follow-up appointment.

## 2013-06-07 NOTE — Assessment & Plan Note (Signed)
Currently with no symptoms of angina. No further workup at this time. Continue current medication regimen. 

## 2013-06-07 NOTE — Assessment & Plan Note (Signed)
Cholesterol is at goal on the current lipid regimen. No changes to the medications were made.  

## 2013-06-09 ENCOUNTER — Encounter: Payer: Self-pay | Admitting: *Deleted

## 2013-06-14 ENCOUNTER — Encounter: Payer: Self-pay | Admitting: *Deleted

## 2013-06-16 ENCOUNTER — Ambulatory Visit (INDEPENDENT_AMBULATORY_CARE_PROVIDER_SITE_OTHER): Payer: Medicare Other | Admitting: General Practice

## 2013-06-16 DIAGNOSIS — I4891 Unspecified atrial fibrillation: Secondary | ICD-10-CM

## 2013-06-16 DIAGNOSIS — I639 Cerebral infarction, unspecified: Secondary | ICD-10-CM

## 2013-06-16 DIAGNOSIS — G459 Transient cerebral ischemic attack, unspecified: Secondary | ICD-10-CM

## 2013-06-16 DIAGNOSIS — I6350 Cerebral infarction due to unspecified occlusion or stenosis of unspecified cerebral artery: Secondary | ICD-10-CM

## 2013-06-16 DIAGNOSIS — Z7901 Long term (current) use of anticoagulants: Secondary | ICD-10-CM

## 2013-06-16 LAB — POCT INR: INR: 2.8

## 2013-06-24 LAB — HEPATIC FUNCTION PANEL A (ARMC)
Albumin: 3.3 g/dL — ABNORMAL LOW (ref 3.4–5.0)
Alkaline Phosphatase: 62 U/L (ref 50–136)
Bilirubin,Total: 0.8 mg/dL (ref 0.2–1.0)
Total Protein: 7.1 g/dL (ref 6.4–8.2)

## 2013-06-24 LAB — CBC
HCT: 33.6 % — ABNORMAL LOW (ref 40.0–52.0)
HGB: 11.5 g/dL — ABNORMAL LOW (ref 13.0–18.0)
MCH: 30.5 pg (ref 26.0–34.0)
MCHC: 34.2 g/dL (ref 32.0–36.0)
MCV: 89 fL (ref 80–100)
RDW: 15.9 % — ABNORMAL HIGH (ref 11.5–14.5)
WBC: 13.3 10*3/uL — ABNORMAL HIGH (ref 3.8–10.6)

## 2013-06-24 LAB — BASIC METABOLIC PANEL
Calcium, Total: 8.5 mg/dL (ref 8.5–10.1)
Chloride: 108 mmol/L — ABNORMAL HIGH (ref 98–107)
Co2: 27 mmol/L (ref 21–32)
Creatinine: 1.13 mg/dL (ref 0.60–1.30)
Glucose: 90 mg/dL (ref 65–99)
Osmolality: 278 (ref 275–301)
Potassium: 3.8 mmol/L (ref 3.5–5.1)
Sodium: 139 mmol/L (ref 136–145)

## 2013-06-24 LAB — PRO B NATRIURETIC PEPTIDE: B-Type Natriuretic Peptide: 2976 pg/mL — ABNORMAL HIGH (ref 0–450)

## 2013-06-24 LAB — TROPONIN I
Troponin-I: <0.02 ng/mL
Troponin-I: <0.02 ng/mL

## 2013-06-24 LAB — APTT: Activated PTT: 47.9 secs — ABNORMAL HIGH (ref 23.6–35.9)

## 2013-06-25 ENCOUNTER — Ambulatory Visit: Payer: Medicare Other | Admitting: Cardiovascular Disease

## 2013-06-25 DIAGNOSIS — R071 Chest pain on breathing: Secondary | ICD-10-CM

## 2013-06-25 DIAGNOSIS — I4891 Unspecified atrial fibrillation: Secondary | ICD-10-CM

## 2013-06-25 DIAGNOSIS — I5032 Chronic diastolic (congestive) heart failure: Secondary | ICD-10-CM

## 2013-06-25 LAB — CBC WITH DIFFERENTIAL/PLATELET
Basophil %: 0.1 %
Eosinophil #: 0 10*3/uL (ref 0.0–0.7)
Eosinophil %: 0 %
HCT: 35.3 % — ABNORMAL LOW (ref 40.0–52.0)
Lymphocyte #: 0.6 10*3/uL — ABNORMAL LOW (ref 1.0–3.6)
Lymphocyte %: 7.6 %
MCHC: 34.1 g/dL (ref 32.0–36.0)
Monocyte %: 1.4 %
Neutrophil %: 90.9 %
Platelet: 173 10*3/uL (ref 150–440)
RBC: 3.92 10*6/uL — ABNORMAL LOW (ref 4.40–5.90)
RDW: 15.5 % — ABNORMAL HIGH (ref 11.5–14.5)
WBC: 8.1 10*3/uL (ref 3.8–10.6)

## 2013-06-25 LAB — TROPONIN I: Troponin-I: <0.02 ng/mL

## 2013-06-25 LAB — BASIC METABOLIC PANEL
Anion Gap: 7 (ref 7–16)
Chloride: 104 mmol/L (ref 98–107)
Creatinine: 1.3 mg/dL (ref 0.60–1.30)
Glucose: 153 mg/dL — ABNORMAL HIGH (ref 65–99)
Potassium: 4.3 mmol/L (ref 3.5–5.1)
Sodium: 138 mmol/L (ref 136–145)

## 2013-06-26 ENCOUNTER — Inpatient Hospital Stay: Payer: Self-pay | Admitting: Internal Medicine

## 2013-06-26 DIAGNOSIS — I2510 Atherosclerotic heart disease of native coronary artery without angina pectoris: Secondary | ICD-10-CM

## 2013-06-26 LAB — PROTIME-INR: INR: 2.9

## 2013-06-27 LAB — BASIC METABOLIC PANEL
Anion Gap: 4 — ABNORMAL LOW (ref 7–16)
BUN: 25 mg/dL — ABNORMAL HIGH (ref 7–18)
Calcium, Total: 8.6 mg/dL (ref 8.5–10.1)
Chloride: 104 mmol/L (ref 98–107)
Co2: 30 mmol/L (ref 21–32)
Creatinine: 1.34 mg/dL — ABNORMAL HIGH (ref 0.60–1.30)
EGFR (African American): 60 — ABNORMAL LOW
Glucose: 94 mg/dL (ref 65–99)
Osmolality: 280 (ref 275–301)
Potassium: 3.6 mmol/L (ref 3.5–5.1)

## 2013-06-27 LAB — CBC WITH DIFFERENTIAL/PLATELET
Basophil #: 0.1 10*3/uL (ref 0.0–0.1)
Basophil %: 0.7 %
Eosinophil %: 1.2 %
HGB: 11.1 g/dL — ABNORMAL LOW (ref 13.0–18.0)
MCH: 30.6 pg (ref 26.0–34.0)
MCHC: 34.1 g/dL (ref 32.0–36.0)
MCV: 90 fL (ref 80–100)
Monocyte %: 6.7 %
Neutrophil #: 9.8 10*3/uL — ABNORMAL HIGH (ref 1.4–6.5)
Neutrophil %: 73.5 %
Platelet: 188 10*3/uL (ref 150–440)
RBC: 3.62 10*6/uL — ABNORMAL LOW (ref 4.40–5.90)
RDW: 16 % — ABNORMAL HIGH (ref 11.5–14.5)

## 2013-06-27 LAB — APTT
Activated PTT: 32.5 secs (ref 23.6–35.9)
Activated PTT: 79.6 secs — ABNORMAL HIGH (ref 23.6–35.9)

## 2013-06-27 LAB — PROTIME-INR: INR: 1.7

## 2013-06-28 ENCOUNTER — Encounter: Payer: Self-pay | Admitting: Cardiovascular Disease

## 2013-06-28 DIAGNOSIS — I2510 Atherosclerotic heart disease of native coronary artery without angina pectoris: Secondary | ICD-10-CM

## 2013-06-28 LAB — BASIC METABOLIC PANEL
BUN: 23 mg/dL — ABNORMAL HIGH (ref 7–18)
Calcium, Total: 8.7 mg/dL (ref 8.5–10.1)
Chloride: 105 mmol/L (ref 98–107)
Co2: 31 mmol/L (ref 21–32)
EGFR (African American): >60
Glucose: 98 mg/dL (ref 65–99)
Osmolality: 281 (ref 275–301)
Sodium: 139 mmol/L (ref 136–145)

## 2013-06-28 LAB — CBC WITH DIFFERENTIAL/PLATELET
Basophil #: 0 10*3/uL (ref 0.0–0.1)
Basophil %: 0.3 %
Eosinophil #: 0.2 10*3/uL (ref 0.0–0.7)
HCT: 33.8 % — ABNORMAL LOW (ref 40.0–52.0)
HGB: 11.3 g/dL — ABNORMAL LOW (ref 13.0–18.0)
Lymphocyte #: 1.7 10*3/uL (ref 1.0–3.6)
Lymphocyte %: 15.1 %
MCH: 30.4 pg (ref 26.0–34.0)
MCHC: 33.4 g/dL (ref 32.0–36.0)
MCV: 91 fL (ref 80–100)
Monocyte #: 0.8 x10 3/mm (ref 0.2–1.0)
Monocyte %: 7.6 %
Platelet: 196 10*3/uL (ref 150–440)
RBC: 3.72 10*6/uL — ABNORMAL LOW (ref 4.40–5.90)
RDW: 15.7 % — ABNORMAL HIGH (ref 11.5–14.5)

## 2013-06-29 DIAGNOSIS — R5383 Other fatigue: Secondary | ICD-10-CM

## 2013-06-29 DIAGNOSIS — I5032 Chronic diastolic (congestive) heart failure: Secondary | ICD-10-CM

## 2013-06-29 DIAGNOSIS — R5381 Other malaise: Secondary | ICD-10-CM

## 2013-06-29 DIAGNOSIS — I2510 Atherosclerotic heart disease of native coronary artery without angina pectoris: Secondary | ICD-10-CM

## 2013-06-29 DIAGNOSIS — I4891 Unspecified atrial fibrillation: Secondary | ICD-10-CM

## 2013-06-29 DIAGNOSIS — J449 Chronic obstructive pulmonary disease, unspecified: Secondary | ICD-10-CM

## 2013-06-29 LAB — PROTIME-INR: INR: 1.1

## 2013-06-29 LAB — APTT: Activated PTT: 66 secs — ABNORMAL HIGH (ref 23.6–35.9)

## 2013-06-30 ENCOUNTER — Other Ambulatory Visit: Payer: Self-pay | Admitting: Cardiovascular Disease

## 2013-06-30 NOTE — Telephone Encounter (Signed)
Please review for refill.  

## 2013-07-02 ENCOUNTER — Telehealth: Payer: Self-pay

## 2013-07-02 ENCOUNTER — Ambulatory Visit (INDEPENDENT_AMBULATORY_CARE_PROVIDER_SITE_OTHER): Payer: Medicare Other | Admitting: Cardiovascular Disease

## 2013-07-02 DIAGNOSIS — I639 Cerebral infarction, unspecified: Secondary | ICD-10-CM

## 2013-07-02 DIAGNOSIS — I6350 Cerebral infarction due to unspecified occlusion or stenosis of unspecified cerebral artery: Secondary | ICD-10-CM

## 2013-07-02 DIAGNOSIS — Z7901 Long term (current) use of anticoagulants: Secondary | ICD-10-CM

## 2013-07-02 DIAGNOSIS — I4891 Unspecified atrial fibrillation: Secondary | ICD-10-CM

## 2013-07-02 NOTE — Telephone Encounter (Signed)
Reporting INR 1.9 PT 23.2 

## 2013-07-02 NOTE — Telephone Encounter (Signed)
Spoke with pt and nurse, instructions given to pt and orders given nurse. See Anti-coagulation encounter.

## 2013-07-02 NOTE — Telephone Encounter (Signed)
Reporting INR 1.9 PT 23.2

## 2013-07-03 DIAGNOSIS — I25810 Atherosclerosis of coronary artery bypass graft(s) without angina pectoris: Secondary | ICD-10-CM

## 2013-07-03 DIAGNOSIS — I951 Orthostatic hypotension: Secondary | ICD-10-CM

## 2013-07-03 DIAGNOSIS — G459 Transient cerebral ischemic attack, unspecified: Secondary | ICD-10-CM

## 2013-07-03 DIAGNOSIS — E785 Hyperlipidemia, unspecified: Secondary | ICD-10-CM

## 2013-07-03 DIAGNOSIS — J449 Chronic obstructive pulmonary disease, unspecified: Secondary | ICD-10-CM

## 2013-07-06 ENCOUNTER — Telehealth: Payer: Self-pay

## 2013-07-06 NOTE — Telephone Encounter (Signed)
Patient contacted regarding discharge from [facility] on 06/25/13.  Woman who answered the phone states I have the wrong #.

## 2013-07-07 ENCOUNTER — Ambulatory Visit (INDEPENDENT_AMBULATORY_CARE_PROVIDER_SITE_OTHER): Payer: Medicare Other | Admitting: Cardiovascular Disease

## 2013-07-07 ENCOUNTER — Telehealth: Payer: Self-pay | Admitting: *Deleted

## 2013-07-07 DIAGNOSIS — Z7901 Long term (current) use of anticoagulants: Secondary | ICD-10-CM

## 2013-07-07 DIAGNOSIS — I6350 Cerebral infarction due to unspecified occlusion or stenosis of unspecified cerebral artery: Secondary | ICD-10-CM

## 2013-07-07 DIAGNOSIS — I639 Cerebral infarction, unspecified: Secondary | ICD-10-CM

## 2013-07-07 DIAGNOSIS — I4891 Unspecified atrial fibrillation: Secondary | ICD-10-CM

## 2013-07-07 NOTE — Telephone Encounter (Signed)
Results noted see anticoagulation note in [facility] 07/07/13.

## 2013-07-07 NOTE — Telephone Encounter (Signed)
[facility] nurse called in his  INR 2.6

## 2013-07-08 ENCOUNTER — Encounter: Payer: Self-pay | Admitting: Cardiovascular Disease

## 2013-07-08 ENCOUNTER — Ambulatory Visit (INDEPENDENT_AMBULATORY_CARE_PROVIDER_SITE_OTHER): Payer: Medicare Other | Admitting: Cardiovascular Disease

## 2013-07-08 ENCOUNTER — Encounter (INDEPENDENT_AMBULATORY_CARE_PROVIDER_SITE_OTHER): Payer: Medicare Other

## 2013-07-08 VITALS — BP 140/82 | HR 82 | Ht 73.0 in | Wt 235.5 lb

## 2013-07-08 DIAGNOSIS — E785 Hyperlipidemia, unspecified: Secondary | ICD-10-CM

## 2013-07-08 DIAGNOSIS — G459 Transient cerebral ischemic attack, unspecified: Secondary | ICD-10-CM

## 2013-07-08 DIAGNOSIS — I639 Cerebral infarction, unspecified: Secondary | ICD-10-CM

## 2013-07-08 DIAGNOSIS — I4891 Unspecified atrial fibrillation: Secondary | ICD-10-CM

## 2013-07-08 DIAGNOSIS — I25810 Atherosclerosis of coronary artery bypass graft(s) without angina pectoris: Secondary | ICD-10-CM

## 2013-07-08 DIAGNOSIS — Z7901 Long term (current) use of anticoagulants: Secondary | ICD-10-CM

## 2013-07-08 DIAGNOSIS — I6350 Cerebral infarction due to unspecified occlusion or stenosis of unspecified cerebral artery: Secondary | ICD-10-CM

## 2013-07-08 DIAGNOSIS — I5032 Chronic diastolic (congestive) heart failure: Secondary | ICD-10-CM

## 2013-07-08 DIAGNOSIS — I10 Essential (primary) hypertension: Secondary | ICD-10-CM

## 2013-07-08 DIAGNOSIS — I509 Heart failure, unspecified: Secondary | ICD-10-CM

## 2013-07-08 NOTE — Assessment & Plan Note (Signed)
Chronic atrial fibrillation, history of TIAs. On warfarin. Rate well controlled

## 2013-07-08 NOTE — Assessment & Plan Note (Signed)
INR 2.5 today. Goal is 2.5-3

## 2013-07-08 NOTE — Progress Notes (Signed)
Patient ID: Javier [name]., male    DOB: 06-20-1936, 77 y.o.   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 He was previously found to be resistant to Plavix by P2Y12 assay  at [facility]   recent TIA. On October 5, he woke in the middle of the night with left arm and left leg weakness. He called 911 and was initially evaluated at [facility], and transferred to [place]. CT scan of the head, MRI did not show an acute stroke. EEG by his report was negative. He was discharged home on warfarin at the same dose. It was noted that INR was 1.79 which was below his typical baseline in the 2 range. He was discharged on 05/25/2013.  he had a full recovery.  Repeat TIA while in our office on 06/02/2013 during his INR check. He was sent to [facility] with [facility] [place] protocol, Released after CT and head MRI.  In follow up today, he reports he is almost back to his baseline. His left arm/hand still has mild residual weakness.    hospitalization in May 2013 chest pain, COPD exacerbation. Stress test showed no ischemia.  Echocardiogram was essentially normal with mild MR. received significant IV fluids through his hospital course.  Edema got worse through the next week with significant pain in his legs. This improved on outpatient diuretics. Lasix held periodically for low blood pressure Found to be very iron deficient, very  anemic. Labs show iron level XVI, hematocrit 24 BNP 400  admission to the hospital 08/31/2012 for shortness of breath. Treated for COPD exacerbation, started on Levaquin IV steroids. CT scan of the chest showing stable pulmonary nodule.   Several recent hospital admissions for orthostatic hypotension, syncope. Previous admission 02/06/2013 Last hospitalized end of July 2014 with discharge July 31 with syncope, orthostatic hypotension. Creatinine was 1.54 with BUN 30 on arrival.  Lasix has been held.   seen by hematology, Dr. Neale Burly, suggested that he have additional testing for platelet aggregation.  He was previously not on warfarin secondary to recent GI bleeds, as well as hematuria. Stroke August 2014 while not on warfarin. He had acute onset mental status changes with garbled speech. He was given TPA on route to [facility], additional workup included carotid ultrasound, head CT scan and MRA. He was discharged on aspirin. He reports near complete resolution of his symptoms.  Recent admission to the hospital for atypical chest pain. Admitted on 06/24/2013. He has stress test on 06/25/2013 that showed moderate ischemia in the lateral and inferolateral wall. He underwent cardiac catheterization 06/28/2013 showing occluded saphenous vein graft to the diagonal. Abnormal stress test likely from ischemia in the OM1 distribution which was not bypassed. No intervention was performed. In followup today, he feels well with no complaints. Breathing is good, weight has been increasing over the past several weeks, likely exacerbated from IV fluids in the hospital. Recent INR 2.5 today. No further TIA or stroke-type symptoms  Prior Carotid ultrasound  done in the hospital 01/12/2012 shows no significant carotid disease , repeat study August 2014  EKG shows atrial fibrillation with rate 82 beats per minute, nonspecific ST and T wave abnormality   Outpatient Encounter Prescriptions as of 07/08/2013  Medication  Sig  . albuterol (VENTOLIN HFA) 108 (90 BASE) MCG/ACT inhaler Inhale 2 puffs into the lungs every 6 (six) hours as needed for wheezing.  Marland Kitchen ALPRAZolam (XANAX) 0.25 MG tablet Take 0.25 mg by mouth every 6 (six) hours as needed for anxiety.   . Ascorbic Acid (VITAMIN C) 1000 MG tablet Take 1,000 mg by mouth daily.  Marland Kitchen aspirin EC 81 MG tablet Take 81 mg by mouth daily.  . budesonide-formoterol (SYMBICORT) 160-4.5 MCG/ACT inhaler Inhale 2 puffs into the lungs 2 (two) times daily.   Marland Kitchen docusate sodium (COLACE) 100 MG capsule Take 100 mg by mouth 2 (two) times daily.  . Ferrous Sulfate (IRON) 325 (65 FE) MG TABS Take 325 mg by mouth 2 (two) times daily.   . fludrocortisone (FLORINEF) 0.1 MG tablet Take 1 tablet (0.1 mg total) by mouth daily.  . furosemide (LASIX) 20 MG tablet Take 20 mg by mouth daily as needed for fluid or edema.  Marland Kitchen HYDROcodone-acetaminophen (NORCO/VICODIN) 5-325 MG per tablet Take 1 tablet by mouth every 6 (six) hours as needed for moderate pain.  Marland Kitchen ipratropium-albuterol (DUONEB) 0.5-2.5 (3) MG/3ML SOLN Take 3 mLs by nebulization every 6 (six) hours as needed (for shortness of breath).   Marland Kitchen leuprolide, 6 Month, (ELIGARD) 45 MG injection Inject 45 mg into the skin every 6 (six) months.  . metoprolol (LOPRESSOR) 50 MG tablet Take 50 mg by mouth 2 (two) times daily.  . montelukast (SINGULAIR) 10 MG tablet Take 10 mg by mouth at bedtime.   . nitroGLYCERIN (NITROSTAT) 0.4 MG SL tablet Place 0.4 mg under the tongue every 5 (five) minutes as needed for chest pain.  Marland Kitchen omega-3 acid ethyl esters (LOVAZA) 1 G capsule Take 1 g by mouth 2 (two) times daily.   Marland Kitchen omeprazole (PRILOSEC) 20 MG capsule Take 20 mg by mouth daily.   . potassium chloride (K-DUR) 10 MEQ tablet Take 10 mEq by mouth daily as needed (takes with lasix).  . rosuvastatin (CRESTOR) 20 MG tablet Take 20 mg by mouth every evening.   . senna (SENOKOT) 8.6 MG tablet Take 1 tablet by mouth daily.  . sertraline (ZOLOFT) 50 MG tablet Take  50 mg by mouth daily.  Marland Kitchen tiotropium (SPIRIVA) 18 MCG inhalation capsule Place 18 mcg into inhaler and inhale daily.   . traMADol-acetaminophen (ULTRACET) 37.5-325 MG per tablet Take 1 tablet by mouth every 4 (four) hours as needed.   . warfarin (COUMADIN) 3 MG tablet TAKE 1 TABLET BY MOUTH DAILY     Review of Systems  Constitutional: Negative.   HENT: Negative.   Eyes: Negative.   Respiratory: Negative.   Cardiovascular: Negative.   Gastrointestinal: Negative.   Endocrine: Negative.   Musculoskeletal: Negative.   Skin: Negative.   Allergic/Immunologic: Negative.   Neurological: Negative.        Mild left arm and hand weakness  Hematological: Negative.   Psychiatric/Behavioral: Negative.   All other systems reviewed and are negative.   BP 140/82  Pulse 82  Ht 6\' 1"  (1.854 m)  Wt 235 lb 8 oz (106.822 kg)  BMI 31.08 kg/m2  Physical Exam  Nursing note and vitals reviewed. Constitutional: He is oriented to person, place, and time. He appears well-developed and well-nourished.  HENT:  Head: Normocephalic.  Nose: Nose normal.  Mouth/Throat: Oropharynx is clear and moist.  Eyes: Conjunctivae are normal. Pupils are equal, round, and reactive to light.  Neck: Normal range of motion. Neck supple. No JVD present.  Cardiovascular: Normal rate, S1 normal, S2 normal and intact distal pulses.  An irregularly irregular rhythm present. Exam reveals no gallop and no friction rub.   Murmur heard.  Crescendo systolic murmur is present with a grade of 2/6  Trace pitting above the sock line bilaterally  Pulmonary/Chest: Effort normal. No respiratory distress. He has decreased breath sounds. He has no wheezes. He has no rales. He exhibits no tenderness.  Abdominal: Soft. Bowel sounds are normal. He exhibits no distension. There is no tenderness.  Musculoskeletal: Normal range of motion. He exhibits no edema and no tenderness.  Lymphadenopathy:    He has no cervical adenopathy.  Neurological:  He is alert and oriented to person, place, and time. Coordination normal.  Skin: Skin is warm and dry. No rash noted. No erythema.  Psychiatric: He has a normal mood and affect. His behavior is normal. Judgment and thought content normal.      Assessment and Plan

## 2013-07-08 NOTE — Assessment & Plan Note (Signed)
Cardiac catheterization report as detailed. No further workup at this time

## 2013-07-08 NOTE — Assessment & Plan Note (Signed)
Blood pressure is well controlled on today's visit. No changes made to the medications. 

## 2013-07-08 NOTE — Patient Instructions (Signed)
You are doing well. No medication changes were made.  Please call us if you have new issues that need to be addressed before your next appt.  Your physician wants you to follow-up in: 6 months.  You will receive a reminder letter in the mail two months in advance. If you don't receive a letter, please call our office to schedule the follow-up appointment.   

## 2013-07-08 NOTE — Assessment & Plan Note (Signed)
Cholesterol is at goal on the current lipid regimen. No changes to the medications were made.  

## 2013-07-08 NOTE — Assessment & Plan Note (Signed)
6 pound weight loss over the past 2 months, likely exacerbated from IV fluids in the hospital. Suggested he take several days of Lasix, then take Lasix as needed for leg edema or shortness of breath

## 2013-07-08 NOTE — Assessment & Plan Note (Signed)
History of frequent TIAs even with INR of 2.  Goal INR is 2.5-3. He does not want other agents

## 2013-07-12 ENCOUNTER — Encounter: Payer: Self-pay | Admitting: *Deleted

## 2013-07-14 ENCOUNTER — Ambulatory Visit (INDEPENDENT_AMBULATORY_CARE_PROVIDER_SITE_OTHER): Payer: Medicare Other | Admitting: Cardiovascular Disease

## 2013-07-14 ENCOUNTER — Telehealth: Payer: Self-pay | Admitting: *Deleted

## 2013-07-14 DIAGNOSIS — I639 Cerebral infarction, unspecified: Secondary | ICD-10-CM

## 2013-07-14 DIAGNOSIS — I6350 Cerebral infarction due to unspecified occlusion or stenosis of unspecified cerebral artery: Secondary | ICD-10-CM

## 2013-07-14 DIAGNOSIS — I4891 Unspecified atrial fibrillation: Secondary | ICD-10-CM

## 2013-07-14 DIAGNOSIS — Z7901 Long term (current) use of anticoagulants: Secondary | ICD-10-CM

## 2013-07-14 LAB — POCT INR: INR: 2.5

## 2013-07-14 NOTE — Telephone Encounter (Signed)
Result noted see anticoagulation note in Epic.  

## 2013-07-14 NOTE — Telephone Encounter (Signed)
Larita Fife with [facility] called in INR 2.5 and pt 30.0

## 2013-07-18 ENCOUNTER — Ambulatory Visit: Payer: Self-pay | Admitting: Physician Assistant

## 2013-07-21 ENCOUNTER — Telehealth: Payer: Self-pay

## 2013-07-21 ENCOUNTER — Ambulatory Visit (INDEPENDENT_AMBULATORY_CARE_PROVIDER_SITE_OTHER): Payer: Medicare Other | Admitting: Cardiovascular Disease

## 2013-07-21 DIAGNOSIS — I4891 Unspecified atrial fibrillation: Secondary | ICD-10-CM

## 2013-07-21 DIAGNOSIS — I639 Cerebral infarction, unspecified: Secondary | ICD-10-CM

## 2013-07-21 DIAGNOSIS — I6350 Cerebral infarction due to unspecified occlusion or stenosis of unspecified cerebral artery: Secondary | ICD-10-CM

## 2013-07-21 DIAGNOSIS — Z7901 Long term (current) use of anticoagulants: Secondary | ICD-10-CM

## 2013-07-21 LAB — POCT INR: INR: 3.5

## 2013-07-21 NOTE — Telephone Encounter (Signed)
PT-42.0/ INR 3.5

## 2013-07-21 NOTE — Telephone Encounter (Signed)
Results noted, see anticoagulation note in Epic.  

## 2013-07-26 ENCOUNTER — Telehealth: Payer: Self-pay

## 2013-07-26 NOTE — Telephone Encounter (Signed)
Order faxed to [facility] at [phone]:  "PT/INR from 12/314: 42.0, 3.5 Pt to take 1/2 of regular dose and eat leafy greens on 07/21/13 New coumadin dosing schedule:  1.5mg  on Sun, Tues, Thurs, Sat 3 mg on M, W, F  Pt has 3 mg tabs in home.  SN to recheck PT/INR on 07/28/13."

## 2013-07-28 ENCOUNTER — Ambulatory Visit (INDEPENDENT_AMBULATORY_CARE_PROVIDER_SITE_OTHER): Payer: Medicare Other | Admitting: Cardiovascular Disease

## 2013-07-28 DIAGNOSIS — I639 Cerebral infarction, unspecified: Secondary | ICD-10-CM

## 2013-07-28 DIAGNOSIS — I4891 Unspecified atrial fibrillation: Secondary | ICD-10-CM

## 2013-07-28 DIAGNOSIS — I6350 Cerebral infarction due to unspecified occlusion or stenosis of unspecified cerebral artery: Secondary | ICD-10-CM

## 2013-07-28 DIAGNOSIS — Z7901 Long term (current) use of anticoagulants: Secondary | ICD-10-CM

## 2013-07-28 LAB — POCT INR: INR: 3

## 2013-08-01 ENCOUNTER — Emergency Department: Payer: Self-pay | Admitting: Emergency Medicine

## 2013-08-01 LAB — URINALYSIS, COMPLETE
Bacteria: NONE SEEN
Bilirubin,UR: NEGATIVE
Glucose,UR: NEGATIVE mg/dL (ref 0–75)
Ketone: NEGATIVE
Leukocyte Esterase: NEGATIVE
Nitrite: NEGATIVE
Protein: NEGATIVE
Squamous Epithelial: <1
WBC UR: 1 /HPF (ref 0–5)

## 2013-08-02 ENCOUNTER — Telehealth: Payer: Self-pay

## 2013-08-02 NOTE — Telephone Encounter (Signed)
Sent signed order for "PT/INR from 07/14/13: 30.0, 2.5 No change in coumadin dosing SN to recheck PT/INR on 12/3 and 12/10 w/ results to [facility]". Faxed to [phone]

## 2013-08-03 ENCOUNTER — Ambulatory Visit: Payer: Self-pay | Admitting: Neurology

## 2013-08-04 ENCOUNTER — Encounter: Payer: Self-pay | Admitting: *Deleted

## 2013-08-14 LAB — COMPREHENSIVE METABOLIC PANEL
Albumin: 3.5 g/dL (ref 3.4–5.0)
Alkaline Phosphatase: 86 U/L
Anion Gap: 7 (ref 7–16)
BUN: 21 mg/dL — ABNORMAL HIGH (ref 7–18)
Bilirubin,Total: 0.4 mg/dL (ref 0.2–1.0)
Chloride: 110 mmol/L — ABNORMAL HIGH (ref 98–107)
Co2: 26 mmol/L (ref 21–32)
EGFR (African American): 53 — ABNORMAL LOW
EGFR (Non-African Amer.): 46 — ABNORMAL LOW
Potassium: 3.9 mmol/L (ref 3.5–5.1)
SGOT(AST): 27 U/L (ref 15–37)
SGPT (ALT): 20 U/L (ref 12–78)
Sodium: 143 mmol/L (ref 136–145)
Total Protein: 8 g/dL (ref 6.4–8.2)

## 2013-08-14 LAB — MAGNESIUM: Magnesium: 1.7 mg/dL — ABNORMAL LOW

## 2013-08-14 LAB — CBC WITH DIFFERENTIAL/PLATELET
Basophil %: 0.8 %
Eosinophil %: 1.9 %
HCT: 38.1 % — ABNORMAL LOW (ref 40.0–52.0)
HGB: 12.4 g/dL — ABNORMAL LOW (ref 13.0–18.0)
Lymphocyte %: 12 %
MCHC: 32.5 g/dL (ref 32.0–36.0)
MCV: 91 fL (ref 80–100)
Monocyte #: 0.7 x10 3/mm (ref 0.2–1.0)
Monocyte %: 5.4 %
Neutrophil #: 9.8 10*3/uL — ABNORMAL HIGH (ref 1.4–6.5)
Neutrophil %: 79.9 %
Platelet: 191 10*3/uL (ref 150–440)

## 2013-08-14 LAB — URINALYSIS, COMPLETE
Blood: NEGATIVE
Ph: 5 (ref 4.5–8.0)
Protein: NEGATIVE
RBC,UR: 2 /HPF (ref 0–5)
Specific Gravity: 1.015 (ref 1.003–1.030)

## 2013-08-14 LAB — TROPONIN I: Troponin-I: <0.02 ng/mL

## 2013-08-14 LAB — PROTIME-INR: Prothrombin Time: 28.4 secs — ABNORMAL HIGH (ref 11.5–14.7)

## 2013-08-15 ENCOUNTER — Observation Stay: Payer: Self-pay | Admitting: Internal Medicine

## 2013-08-15 LAB — MAGNESIUM: Magnesium: 2 mg/dL

## 2013-08-17 ENCOUNTER — Telehealth: Payer: Self-pay | Admitting: *Deleted

## 2013-08-17 ENCOUNTER — Ambulatory Visit (INDEPENDENT_AMBULATORY_CARE_PROVIDER_SITE_OTHER): Payer: Medicare Other | Admitting: Cardiology

## 2013-08-17 DIAGNOSIS — I4891 Unspecified atrial fibrillation: Secondary | ICD-10-CM

## 2013-08-17 DIAGNOSIS — I6350 Cerebral infarction due to unspecified occlusion or stenosis of unspecified cerebral artery: Secondary | ICD-10-CM

## 2013-08-17 DIAGNOSIS — Z7901 Long term (current) use of anticoagulants: Secondary | ICD-10-CM

## 2013-08-17 DIAGNOSIS — I639 Cerebral infarction, unspecified: Secondary | ICD-10-CM

## 2013-08-17 LAB — POCT INR: INR: 3.3

## 2013-08-17 NOTE — Telephone Encounter (Signed)
INR result noted see anticoagulation note for details.

## 2013-08-17 NOTE — Telephone Encounter (Signed)
Home health nurse called in INR 3.3

## 2013-08-18 ENCOUNTER — Telehealth: Payer: Self-pay

## 2013-08-18 NOTE — Telephone Encounter (Signed)
Nurse called requesting status on order faxed over on 07/22/13. Please call.

## 2013-08-18 NOTE — Telephone Encounter (Signed)
Spoke w/ Tiffany.  She reports that she faxed over second request to have signed.

## 2013-08-31 ENCOUNTER — Telehealth: Payer: Self-pay

## 2013-08-31 ENCOUNTER — Ambulatory Visit (INDEPENDENT_AMBULATORY_CARE_PROVIDER_SITE_OTHER): Payer: Medicare Other | Admitting: Cardiology

## 2013-08-31 DIAGNOSIS — I4891 Unspecified atrial fibrillation: Secondary | ICD-10-CM

## 2013-08-31 DIAGNOSIS — Z7901 Long term (current) use of anticoagulants: Secondary | ICD-10-CM

## 2013-08-31 DIAGNOSIS — I639 Cerebral infarction, unspecified: Secondary | ICD-10-CM

## 2013-08-31 DIAGNOSIS — I6350 Cerebral infarction due to unspecified occlusion or stenosis of unspecified cerebral artery: Secondary | ICD-10-CM

## 2013-08-31 LAB — PROTIME-INR: INR: 8.4 — AB (ref <=1.1)

## 2013-08-31 LAB — LAB REPORT - SCANNED: INR: 8.4

## 2013-08-31 LAB — POCT INR: INR: 5.8

## 2013-08-31 NOTE — Telephone Encounter (Signed)
Javier [name] w/ [facility] called w/ pt's INR: 5.8. Per her protocol, she will perform venipuncture and send stat. She requests that she be called w/ further instructions.

## 2013-08-31 NOTE — Telephone Encounter (Signed)
Javier [name] reports that pt denies unusual bleeding or bruising, denies change in diet. Reports that pt is currently taking abx Ceftin 500mg  bid.

## 2013-08-31 NOTE — Telephone Encounter (Signed)
Juanita called w/ results of stat venipuncture: INR 8.4 Results faxed to our office.

## 2013-08-31 NOTE — Telephone Encounter (Signed)
Results addressed see anticoagulation note in Epic. 

## 2013-08-31 NOTE — Telephone Encounter (Signed)
Result addressed see anticoagulation note in Epic. 

## 2013-09-02 ENCOUNTER — Other Ambulatory Visit: Payer: Self-pay | Admitting: *Deleted

## 2013-09-03 ENCOUNTER — Telehealth: Payer: Self-pay

## 2013-09-03 ENCOUNTER — Ambulatory Visit (INDEPENDENT_AMBULATORY_CARE_PROVIDER_SITE_OTHER): Payer: Medicare Other | Admitting: Cardiology

## 2013-09-03 DIAGNOSIS — Z7901 Long term (current) use of anticoagulants: Secondary | ICD-10-CM

## 2013-09-03 DIAGNOSIS — I639 Cerebral infarction, unspecified: Secondary | ICD-10-CM

## 2013-09-03 DIAGNOSIS — I6350 Cerebral infarction due to unspecified occlusion or stenosis of unspecified cerebral artery: Secondary | ICD-10-CM

## 2013-09-03 DIAGNOSIS — I4891 Unspecified atrial fibrillation: Secondary | ICD-10-CM

## 2013-09-03 LAB — POCT INR: INR: 1.6

## 2013-09-03 MED ORDER — FLUDROCORTISONE ACETATE 0.1 MG PO TABS: 0.1000 mg | ORAL_TABLET | Freq: Every day | ORAL | Status: DC

## 2013-09-03 NOTE — Telephone Encounter (Signed)
Marita Kansas, RN from home health reports pt's INR: 1.6. Reports that pt has been holding coumadin all week, eating lots of greens and is scheduled to take 1/2 tab today, but has not taken yet. Please call Marita Kansas at [phone] w/ instructions.  Thank you!

## 2013-09-03 NOTE — Telephone Encounter (Signed)
Result noted, see anticoagulation note in Epic. 

## 2013-09-07 ENCOUNTER — Ambulatory Visit: Payer: Medicare Other | Admitting: Cardiovascular Disease

## 2013-09-08 ENCOUNTER — Telehealth: Payer: Self-pay | Admitting: *Deleted

## 2013-09-08 ENCOUNTER — Ambulatory Visit (INDEPENDENT_AMBULATORY_CARE_PROVIDER_SITE_OTHER): Payer: Medicare Other | Admitting: Cardiology

## 2013-09-08 DIAGNOSIS — I4891 Unspecified atrial fibrillation: Secondary | ICD-10-CM

## 2013-09-08 DIAGNOSIS — Z7901 Long term (current) use of anticoagulants: Secondary | ICD-10-CM

## 2013-09-08 DIAGNOSIS — I639 Cerebral infarction, unspecified: Secondary | ICD-10-CM

## 2013-09-08 DIAGNOSIS — I6350 Cerebral infarction due to unspecified occlusion or stenosis of unspecified cerebral artery: Secondary | ICD-10-CM

## 2013-09-08 LAB — POCT INR: INR: 2.4

## 2013-09-08 NOTE — Telephone Encounter (Signed)
Results noted, see anticoagulation note in Epic.  

## 2013-09-08 NOTE — Telephone Encounter (Signed)
Home health nurse calling with INR results 2.4

## 2013-09-15 ENCOUNTER — Telehealth: Payer: Self-pay | Admitting: *Deleted

## 2013-09-15 NOTE — Telephone Encounter (Signed)
Patient's wife called and he is back prednisone starting today w/ 6 tablets and tapers down.

## 2013-09-15 NOTE — Telephone Encounter (Signed)
Returned call to pt's wife, pt started on prednisone taper today.  Tooks 6 tablets, 5 tablets tomorrow then taper down by 1 tablet daily until off.  [facility] RN was at home today, but did not check INR.  Advised will call [place] and have INR checked on Friday while on Prednisone and adjust dosage at that time if needed.  Pt's INR has previously gone up on Prednisone, but given hx of TIA's will await result on Friday to dosage adjust.

## 2013-09-16 NOTE — Telephone Encounter (Signed)
Spoke with Jeani Hawking, [facility] RN she has made an appt to check pt's INR tomorrow 09/17/13 with results called to coumadin clinic.

## 2013-09-17 ENCOUNTER — Ambulatory Visit (INDEPENDENT_AMBULATORY_CARE_PROVIDER_SITE_OTHER): Payer: Medicare Other | Admitting: Cardiovascular Disease

## 2013-09-17 ENCOUNTER — Telehealth: Payer: Self-pay

## 2013-09-17 DIAGNOSIS — Z7901 Long term (current) use of anticoagulants: Secondary | ICD-10-CM

## 2013-09-17 DIAGNOSIS — I6350 Cerebral infarction due to unspecified occlusion or stenosis of unspecified cerebral artery: Secondary | ICD-10-CM

## 2013-09-17 DIAGNOSIS — I4891 Unspecified atrial fibrillation: Secondary | ICD-10-CM

## 2013-09-17 DIAGNOSIS — I639 Cerebral infarction, unspecified: Secondary | ICD-10-CM

## 2013-09-17 LAB — POCT INR: INR: 2.6

## 2013-09-17 NOTE — Telephone Encounter (Signed)
Faxed order to [facility] @ [phone]:  09/05/13: "INR 1.6.  Pt to take 3 mg coumadin today and tomorrow and resume 1 and 1/2 tablets daily except on MWF.  Pt to take 3 mg on MWF.  SN to recheck INR on 09/08/13 using protime machine"

## 2013-09-20 ENCOUNTER — Telehealth: Payer: Self-pay

## 2013-09-20 ENCOUNTER — Ambulatory Visit (INDEPENDENT_AMBULATORY_CARE_PROVIDER_SITE_OTHER): Payer: Medicare Other | Admitting: Interventional Cardiology

## 2013-09-20 ENCOUNTER — Inpatient Hospital Stay: Payer: Self-pay | Admitting: Internal Medicine

## 2013-09-20 DIAGNOSIS — I639 Cerebral infarction, unspecified: Secondary | ICD-10-CM

## 2013-09-20 DIAGNOSIS — I4891 Unspecified atrial fibrillation: Secondary | ICD-10-CM

## 2013-09-20 DIAGNOSIS — Z7901 Long term (current) use of anticoagulants: Secondary | ICD-10-CM

## 2013-09-20 DIAGNOSIS — I6350 Cerebral infarction due to unspecified occlusion or stenosis of unspecified cerebral artery: Secondary | ICD-10-CM

## 2013-09-20 LAB — COMPREHENSIVE METABOLIC PANEL
ALT: 57 U/L (ref 12–78)
ANION GAP: 2 — AB (ref 7–16)
Albumin: 3.2 g/dL — ABNORMAL LOW (ref 3.4–5.0)
Alkaline Phosphatase: 62 U/L
BUN: 25 mg/dL — AB (ref 7–18)
Bilirubin,Total: 0.4 mg/dL (ref 0.2–1.0)
CHLORIDE: 104 mmol/L (ref 98–107)
Calcium, Total: 8.6 mg/dL (ref 8.5–10.1)
Co2: 30 mmol/L (ref 21–32)
Creatinine: 1.07 mg/dL (ref 0.60–1.30)
EGFR (African American): >60
EGFR (Non-African Amer.): >60
Glucose: 130 mg/dL — ABNORMAL HIGH (ref 65–99)
OSMOLALITY: 278 (ref 275–301)
Potassium: 3.8 mmol/L (ref 3.5–5.1)
SGOT(AST): 34 U/L (ref 15–37)
SODIUM: 136 mmol/L (ref 136–145)
Total Protein: 7.5 g/dL (ref 6.4–8.2)

## 2013-09-20 LAB — TROPONIN I: Troponin-I: <0.02 ng/mL

## 2013-09-20 LAB — CBC
HCT: 38.9 % — ABNORMAL LOW (ref 40.0–52.0)
HGB: 12.7 g/dL — ABNORMAL LOW (ref 13.0–18.0)
MCH: 29.6 pg (ref 26.0–34.0)
MCHC: 32.7 g/dL (ref 32.0–36.0)
MCV: 91 fL (ref 80–100)
Platelet: 217 10*3/uL (ref 150–440)
RBC: 4.29 10*6/uL — AB (ref 4.40–5.90)
RDW: 14 % (ref 11.5–14.5)
WBC: 11.1 10*3/uL — AB (ref 3.8–10.6)

## 2013-09-20 LAB — PROTIME-INR
INR: 4.1
Prothrombin Time: 38.7 secs — ABNORMAL HIGH (ref 11.5–14.7)

## 2013-09-20 LAB — MAGNESIUM: Magnesium: 1.9 mg/dL

## 2013-09-20 LAB — POCT INR: INR: 5.5

## 2013-09-20 LAB — PRO B NATRIURETIC PEPTIDE: B-TYPE NATIURETIC PEPTID: 3422 pg/mL — AB (ref 0–450)

## 2013-09-20 NOTE — Telephone Encounter (Signed)
Faxed signed orders to [facility] at [phone]:  "PT/INR from 08/31/13:  Initial coagucheck result 6.1 and 5.8 on repeat. Venipuncture performed and labs run STAT w/ results of 89.1, 8.4. Pt instructed to hold coumadin dosing and to consume leafy greens.  SN to repeat PT/INR on 09/02/13. Results to be called to [facility] at [phone] or [phone]".

## 2013-09-21 LAB — BASIC METABOLIC PANEL
Anion Gap: 6 — ABNORMAL LOW (ref 7–16)
BUN: 28 mg/dL — AB (ref 7–18)
CALCIUM: 8.7 mg/dL (ref 8.5–10.1)
CHLORIDE: 101 mmol/L (ref 98–107)
CREATININE: 1.09 mg/dL (ref 0.60–1.30)
Co2: 30 mmol/L (ref 21–32)
EGFR (African American): >60
EGFR (Non-African Amer.): >60
Glucose: 126 mg/dL — ABNORMAL HIGH (ref 65–99)
Osmolality: 281 (ref 275–301)
Potassium: 3.9 mmol/L (ref 3.5–5.1)
Sodium: 137 mmol/L (ref 136–145)

## 2013-09-21 LAB — PROTIME-INR
INR: 3.8
PROTHROMBIN TIME: 36.3 s — AB (ref 11.5–14.7)

## 2013-09-21 LAB — TSH: Thyroid Stimulating Horm: 0.158 u[IU]/mL — ABNORMAL LOW

## 2013-09-21 LAB — TROPONIN I

## 2013-09-21 LAB — MAGNESIUM: Magnesium: 1.9 mg/dL

## 2013-09-22 LAB — PROTIME-INR
INR: 3.2
Prothrombin Time: 31.5 secs — ABNORMAL HIGH (ref 11.5–14.7)

## 2013-09-23 LAB — PROTIME-INR
INR: 3.2
Prothrombin Time: 31.6 secs — ABNORMAL HIGH (ref 11.5–14.7)

## 2013-09-24 LAB — HEMOGLOBIN: HGB: 12.8 g/dL — AB (ref 13.0–18.0)

## 2013-09-24 LAB — PROTIME-INR
INR: 3.1
PROTHROMBIN TIME: 30.7 s — AB (ref 11.5–14.7)

## 2013-09-25 LAB — CULTURE, BLOOD (SINGLE)

## 2013-09-25 LAB — PROTIME-INR
INR: 3.5
Prothrombin Time: 34.4 secs — ABNORMAL HIGH (ref 11.5–14.7)

## 2013-09-26 LAB — PROTIME-INR
INR: 3.8
Prothrombin Time: 36 secs — ABNORMAL HIGH (ref 11.5–14.7)

## 2013-09-30 ENCOUNTER — Ambulatory Visit (INDEPENDENT_AMBULATORY_CARE_PROVIDER_SITE_OTHER): Payer: Medicare Other | Admitting: Pharmacist

## 2013-09-30 DIAGNOSIS — I639 Cerebral infarction, unspecified: Secondary | ICD-10-CM

## 2013-09-30 DIAGNOSIS — I6350 Cerebral infarction due to unspecified occlusion or stenosis of unspecified cerebral artery: Secondary | ICD-10-CM

## 2013-09-30 DIAGNOSIS — I4891 Unspecified atrial fibrillation: Secondary | ICD-10-CM

## 2013-09-30 DIAGNOSIS — Z7901 Long term (current) use of anticoagulants: Secondary | ICD-10-CM

## 2013-09-30 LAB — POCT INR: INR: 8

## 2013-10-01 ENCOUNTER — Other Ambulatory Visit: Payer: Self-pay

## 2013-10-01 ENCOUNTER — Other Ambulatory Visit: Payer: Self-pay | Admitting: Cardiovascular Disease

## 2013-10-01 ENCOUNTER — Encounter (INDEPENDENT_AMBULATORY_CARE_PROVIDER_SITE_OTHER): Payer: Medicare Other

## 2013-10-01 DIAGNOSIS — Z7901 Long term (current) use of anticoagulants: Principal | ICD-10-CM

## 2013-10-01 DIAGNOSIS — I4891 Unspecified atrial fibrillation: Secondary | ICD-10-CM

## 2013-10-01 DIAGNOSIS — I6350 Cerebral infarction due to unspecified occlusion or stenosis of unspecified cerebral artery: Secondary | ICD-10-CM

## 2013-10-01 DIAGNOSIS — G459 Transient cerebral ischemic attack, unspecified: Secondary | ICD-10-CM

## 2013-10-01 DIAGNOSIS — Z5181 Encounter for therapeutic drug level monitoring: Secondary | ICD-10-CM

## 2013-10-01 DIAGNOSIS — I639 Cerebral infarction, unspecified: Secondary | ICD-10-CM

## 2013-10-01 LAB — POCT INR: INR: 5.6

## 2013-10-01 LAB — PROTIME-INR
INR: 5.6 — AB
Prothrombin Time: 48.6 secs — ABNORMAL HIGH (ref 11.5–14.7)

## 2013-10-01 NOTE — Progress Notes (Signed)
This encounter was created in error - please disregard.

## 2013-10-04 ENCOUNTER — Telehealth: Payer: Self-pay | Admitting: Family Medicine

## 2013-10-04 LAB — POCT INR: INR: 1.9

## 2013-10-04 NOTE — Telephone Encounter (Signed)
Jeani Hawking from [facility] called to give pt's INR results because she could not get through to Dr. Donivan Scull office.  His INR today is 1.9.  He has held Coumadin Fri/Sat/Sun.  Cb [phone].

## 2013-10-04 NOTE — Telephone Encounter (Signed)
Consider restarting warfarin 1.5 mg on Monday, Wednesday, Friday, Saturday Would use low dose while he is on  Prednisone Recheck in 2 weeks

## 2013-10-06 ENCOUNTER — Ambulatory Visit (INDEPENDENT_AMBULATORY_CARE_PROVIDER_SITE_OTHER): Payer: Medicare Other | Admitting: Internal Medicine

## 2013-10-06 DIAGNOSIS — I6350 Cerebral infarction due to unspecified occlusion or stenosis of unspecified cerebral artery: Secondary | ICD-10-CM

## 2013-10-06 DIAGNOSIS — Z7901 Long term (current) use of anticoagulants: Secondary | ICD-10-CM

## 2013-10-06 DIAGNOSIS — I4891 Unspecified atrial fibrillation: Secondary | ICD-10-CM

## 2013-10-06 DIAGNOSIS — I639 Cerebral infarction, unspecified: Secondary | ICD-10-CM

## 2013-10-06 NOTE — Telephone Encounter (Signed)
Spoke with pt's wife, pt completed Prednisone rx today 10/06/13.  Pt has been holding Coumadin since 10/01/13.  Advised to have pt take 3mg  today, then resume 1.5mg  daily since off Coumadin.  Recheck on Monday 10/11/13.

## 2013-10-06 NOTE — Telephone Encounter (Signed)
Left message for pt to call back  °

## 2013-10-08 LAB — URINALYSIS, COMPLETE
BILIRUBIN, UR: NEGATIVE
Blood: NEGATIVE
GLUCOSE, UR: NEGATIVE mg/dL (ref 0–75)
KETONE: NEGATIVE
Leukocyte Esterase: NEGATIVE
Nitrite: NEGATIVE
PROTEIN: NEGATIVE
Ph: 7 (ref 4.5–8.0)
SPECIFIC GRAVITY: 1.016 (ref 1.003–1.030)
SQUAMOUS EPITHELIAL: NONE SEEN
WBC UR: <1 /HPF (ref 0–5)

## 2013-10-08 LAB — CBC WITH DIFFERENTIAL/PLATELET
Basophil #: 0 10*3/uL (ref 0.0–0.1)
Basophil %: 0.4 %
Eosinophil #: 0.1 10*3/uL (ref 0.0–0.7)
Eosinophil %: 1.1 %
HCT: 33.2 % — ABNORMAL LOW (ref 40.0–52.0)
HGB: 11 g/dL — ABNORMAL LOW (ref 13.0–18.0)
Lymphocyte #: 0.9 10*3/uL — ABNORMAL LOW (ref 1.0–3.6)
Lymphocyte %: 10.2 %
MCH: 30.2 pg (ref 26.0–34.0)
MCHC: 33.2 g/dL (ref 32.0–36.0)
MCV: 91 fL (ref 80–100)
MONO ABS: 0.4 x10 3/mm (ref 0.2–1.0)
MONOS PCT: 4.1 %
NEUTROS ABS: 7.8 10*3/uL — AB (ref 1.4–6.5)
Neutrophil %: 84.2 %
PLATELETS: 79 10*3/uL — AB (ref 150–440)
RBC: 3.65 10*6/uL — ABNORMAL LOW (ref 4.40–5.90)
RDW: 15.6 % — ABNORMAL HIGH (ref 11.5–14.5)
WBC: 9.3 10*3/uL (ref 3.8–10.6)

## 2013-10-08 LAB — COMPREHENSIVE METABOLIC PANEL
Albumin: 2.7 g/dL — ABNORMAL LOW (ref 3.4–5.0)
Alkaline Phosphatase: 62 U/L
Anion Gap: 1 — ABNORMAL LOW (ref 7–16)
BILIRUBIN TOTAL: 0.6 mg/dL (ref 0.2–1.0)
BUN: 19 mg/dL — ABNORMAL HIGH (ref 7–18)
CO2: 34 mmol/L — AB (ref 21–32)
CREATININE: 1.09 mg/dL (ref 0.60–1.30)
Calcium, Total: 8.2 mg/dL — ABNORMAL LOW (ref 8.5–10.1)
Chloride: 108 mmol/L — ABNORMAL HIGH (ref 98–107)
EGFR (Non-African Amer.): >60
GLUCOSE: 93 mg/dL (ref 65–99)
OSMOLALITY: 287 (ref 275–301)
POTASSIUM: 3.9 mmol/L (ref 3.5–5.1)
SGOT(AST): 17 U/L (ref 15–37)
SGPT (ALT): 19 U/L (ref 12–78)
SODIUM: 143 mmol/L (ref 136–145)
Total Protein: 6.5 g/dL (ref 6.4–8.2)

## 2013-10-08 LAB — PROTIME-INR
INR: 1.3
Prothrombin Time: 16.1 secs — ABNORMAL HIGH (ref 11.5–14.7)

## 2013-10-08 LAB — TROPONIN I: Troponin-I: <0.02 ng/mL

## 2013-10-09 ENCOUNTER — Ambulatory Visit: Payer: Self-pay | Admitting: Neurology

## 2013-10-09 DIAGNOSIS — I059 Rheumatic mitral valve disease, unspecified: Secondary | ICD-10-CM

## 2013-10-09 LAB — BASIC METABOLIC PANEL
Anion Gap: 4 — ABNORMAL LOW (ref 7–16)
BUN: 19 mg/dL — AB (ref 7–18)
CHLORIDE: 107 mmol/L (ref 98–107)
CO2: 29 mmol/L (ref 21–32)
Calcium, Total: 8.8 mg/dL (ref 8.5–10.1)
Creatinine: 1.17 mg/dL (ref 0.60–1.30)
EGFR (African American): >60
GLUCOSE: 96 mg/dL (ref 65–99)
Osmolality: 282 (ref 275–301)
Potassium: 3.6 mmol/L (ref 3.5–5.1)
Sodium: 140 mmol/L (ref 136–145)

## 2013-10-09 LAB — CBC WITH DIFFERENTIAL/PLATELET
Basophil #: 0 10*3/uL (ref 0.0–0.1)
Basophil %: 0.6 %
EOS ABS: 0.1 10*3/uL (ref 0.0–0.7)
Eosinophil %: 1.8 %
HCT: 33 % — ABNORMAL LOW (ref 40.0–52.0)
HGB: 10.8 g/dL — ABNORMAL LOW (ref 13.0–18.0)
LYMPHS PCT: 20.3 %
Lymphocyte #: 1.7 10*3/uL (ref 1.0–3.6)
MCH: 29.9 pg (ref 26.0–34.0)
MCHC: 32.7 g/dL (ref 32.0–36.0)
MCV: 91 fL (ref 80–100)
Monocyte #: 0.4 x10 3/mm (ref 0.2–1.0)
Monocyte %: 5.3 %
NEUTROS PCT: 72 %
Neutrophil #: 6 10*3/uL (ref 1.4–6.5)
Platelet: 78 10*3/uL — ABNORMAL LOW (ref 150–440)
RBC: 3.61 10*6/uL — AB (ref 4.40–5.90)
RDW: 14.7 % — ABNORMAL HIGH (ref 11.5–14.5)
WBC: 8.3 10*3/uL (ref 3.8–10.6)

## 2013-10-09 LAB — PROTIME-INR
INR: 1.4
Prothrombin Time: 16.9 secs — ABNORMAL HIGH (ref 11.5–14.7)

## 2013-10-09 LAB — LIPID PANEL
Cholesterol: 154 mg/dL (ref 0–200)
HDL Cholesterol: 48 mg/dL (ref 40–60)
Ldl Cholesterol, Calc: 65 mg/dL (ref 0–100)
Triglycerides: 207 mg/dL — ABNORMAL HIGH (ref 0–200)
VLDL Cholesterol, Calc: 41 mg/dL — ABNORMAL HIGH (ref 5–40)

## 2013-10-09 LAB — TSH: Thyroid Stimulating Horm: 2.85 u[IU]/mL

## 2013-10-10 ENCOUNTER — Inpatient Hospital Stay: Payer: Self-pay | Admitting: Internal Medicine

## 2013-10-10 DIAGNOSIS — I2510 Atherosclerotic heart disease of native coronary artery without angina pectoris: Secondary | ICD-10-CM

## 2013-10-10 DIAGNOSIS — I5032 Chronic diastolic (congestive) heart failure: Secondary | ICD-10-CM

## 2013-10-10 DIAGNOSIS — I714 Abdominal aortic aneurysm, without rupture, unspecified: Secondary | ICD-10-CM

## 2013-10-10 DIAGNOSIS — I4891 Unspecified atrial fibrillation: Secondary | ICD-10-CM

## 2013-10-11 LAB — BASIC METABOLIC PANEL
ANION GAP: 1 — AB (ref 7–16)
BUN: 16 mg/dL (ref 7–18)
CREATININE: 1.18 mg/dL (ref 0.60–1.30)
Calcium, Total: 9.2 mg/dL (ref 8.5–10.1)
Chloride: 103 mmol/L (ref 98–107)
Co2: 35 mmol/L — ABNORMAL HIGH (ref 21–32)
EGFR (Non-African Amer.): >60
Glucose: 89 mg/dL (ref 65–99)
OSMOLALITY: 278 (ref 275–301)
POTASSIUM: 4.5 mmol/L (ref 3.5–5.1)
Sodium: 139 mmol/L (ref 136–145)

## 2013-10-12 LAB — PLATELET COUNT: Platelet: 140 10*3/uL — ABNORMAL LOW (ref 150–440)

## 2013-10-13 LAB — CBC WITH DIFFERENTIAL/PLATELET
Basophil #: 0 10*3/uL (ref 0.0–0.1)
Basophil %: 0.7 %
EOS PCT: 3.2 %
Eosinophil #: 0.2 10*3/uL (ref 0.0–0.7)
HCT: 32.1 % — ABNORMAL LOW (ref 40.0–52.0)
HGB: 10.4 g/dL — ABNORMAL LOW (ref 13.0–18.0)
LYMPHS PCT: 16.5 %
Lymphocyte #: 0.9 10*3/uL — ABNORMAL LOW (ref 1.0–3.6)
MCH: 29.2 pg (ref 26.0–34.0)
MCHC: 32.5 g/dL (ref 32.0–36.0)
MCV: 90 fL (ref 80–100)
MONOS PCT: 7.8 %
Monocyte #: 0.4 x10 3/mm (ref 0.2–1.0)
NEUTROS ABS: 3.8 10*3/uL (ref 1.4–6.5)
Neutrophil %: 71.8 %
Platelet: 166 10*3/uL (ref 150–440)
RBC: 3.57 10*6/uL — AB (ref 4.40–5.90)
RDW: 15 % — ABNORMAL HIGH (ref 11.5–14.5)
WBC: 5.3 10*3/uL (ref 3.8–10.6)

## 2013-10-13 LAB — PROTIME-INR
INR: 1.1
PROTHROMBIN TIME: 14 s (ref 11.5–14.7)

## 2013-10-14 DIAGNOSIS — I4891 Unspecified atrial fibrillation: Secondary | ICD-10-CM

## 2013-10-14 DIAGNOSIS — I5032 Chronic diastolic (congestive) heart failure: Secondary | ICD-10-CM

## 2013-10-14 LAB — CBC WITH DIFFERENTIAL/PLATELET
BASOS ABS: 0 10*3/uL (ref 0.0–0.1)
BASOS PCT: 0.1 %
Basophil #: 0 10*3/uL (ref 0.0–0.1)
Basophil %: 0.1 %
EOS ABS: 0 10*3/uL (ref 0.0–0.7)
Eosinophil #: 0 10*3/uL (ref 0.0–0.7)
Eosinophil %: 0 %
Eosinophil %: 0 %
HCT: 30.2 % — ABNORMAL LOW (ref 40.0–52.0)
HCT: 31.8 % — ABNORMAL LOW (ref 40.0–52.0)
HGB: 10.1 g/dL — AB (ref 13.0–18.0)
HGB: 10.4 g/dL — AB (ref 13.0–18.0)
Lymphocyte #: 0.5 10*3/uL — ABNORMAL LOW (ref 1.0–3.6)
Lymphocyte #: 0.7 10*3/uL — ABNORMAL LOW (ref 1.0–3.6)
Lymphocyte %: 6.6 %
Lymphocyte %: 6.7 %
MCH: 30.3 pg (ref 26.0–34.0)
MCH: 29.8 pg (ref 26.0–34.0)
MCHC: 33.5 g/dL (ref 32.0–36.0)
MCHC: 32.6 g/dL (ref 32.0–36.0)
MCV: 90 fL (ref 80–100)
MCV: 91 fL (ref 80–100)
Monocyte #: 0.2 x10 3/mm (ref 0.2–1.0)
Monocyte #: 0.4 x10 3/mm (ref 0.2–1.0)
Monocyte %: 2.9 %
Monocyte %: 3.7 %
NEUTROS ABS: 9.4 10*3/uL — AB (ref 1.4–6.5)
NEUTROS PCT: 90.4 %
NEUTROS PCT: 89.5 %
Neutrophil #: 7.1 10*3/uL — ABNORMAL HIGH (ref 1.4–6.5)
PLATELETS: 192 10*3/uL (ref 150–440)
PLATELETS: 240 10*3/uL (ref 150–440)
RBC: 3.34 10*6/uL — ABNORMAL LOW (ref 4.40–5.90)
RBC: 3.49 10*6/uL — ABNORMAL LOW (ref 4.40–5.90)
RDW: 15 % — AB (ref 11.5–14.5)
RDW: 15.2 % — AB (ref 11.5–14.5)
WBC: 7.8 10*3/uL (ref 3.8–10.6)
WBC: 10.5 10*3/uL (ref 3.8–10.6)

## 2013-10-14 LAB — PROTIME-INR
INR: 1.1
Prothrombin Time: 14.1 secs (ref 11.5–14.7)

## 2013-10-14 LAB — APTT
ACTIVATED PTT: 35.3 s (ref 23.6–35.9)
Activated PTT: 35.2 secs (ref 23.6–35.9)
Activated PTT: 53 secs — ABNORMAL HIGH (ref 23.6–35.9)

## 2013-10-14 LAB — COMPREHENSIVE METABOLIC PANEL
ALT: 21 U/L (ref 12–78)
ANION GAP: 5 — AB (ref 7–16)
AST: 20 U/L (ref 15–37)
Albumin: 2.9 g/dL — ABNORMAL LOW (ref 3.4–5.0)
Alkaline Phosphatase: 68 U/L
BUN: 12 mg/dL (ref 7–18)
Bilirubin,Total: 0.4 mg/dL (ref 0.2–1.0)
CHLORIDE: 107 mmol/L (ref 98–107)
Calcium, Total: 8.7 mg/dL (ref 8.5–10.1)
Co2: 28 mmol/L (ref 21–32)
Creatinine: 1.01 mg/dL (ref 0.60–1.30)
Glucose: 120 mg/dL — ABNORMAL HIGH (ref 65–99)
OSMOLALITY: 280 (ref 275–301)
Potassium: 4.4 mmol/L (ref 3.5–5.1)
Sodium: 140 mmol/L (ref 136–145)
Total Protein: 6.6 g/dL (ref 6.4–8.2)

## 2013-10-14 LAB — MAGNESIUM: MAGNESIUM: 1.8 mg/dL

## 2013-10-14 LAB — PHOSPHORUS: PHOSPHORUS: 3.9 mg/dL (ref 2.5–4.9)

## 2013-10-15 ENCOUNTER — Telehealth: Payer: Self-pay

## 2013-10-15 LAB — CBC WITH DIFFERENTIAL/PLATELET
BASOS PCT: 0.2 %
Basophil #: 0 10*3/uL (ref 0.0–0.1)
Eosinophil #: 0 10*3/uL (ref 0.0–0.7)
Eosinophil %: 0.4 %
HCT: 30.7 % — ABNORMAL LOW (ref 40.0–52.0)
HGB: 10.1 g/dL — ABNORMAL LOW (ref 13.0–18.0)
Lymphocyte #: 1.7 10*3/uL (ref 1.0–3.6)
Lymphocyte %: 15.5 %
MCH: 29.9 pg (ref 26.0–34.0)
MCHC: 32.8 g/dL (ref 32.0–36.0)
MCV: 91 fL (ref 80–100)
MONO ABS: 0.8 x10 3/mm (ref 0.2–1.0)
Monocyte %: 7.1 %
NEUTROS ABS: 8.4 10*3/uL — AB (ref 1.4–6.5)
Neutrophil %: 76.8 %
Platelet: 256 10*3/uL (ref 150–440)
RBC: 3.36 10*6/uL — ABNORMAL LOW (ref 4.40–5.90)
RDW: 15.6 % — ABNORMAL HIGH (ref 11.5–14.5)
WBC: 10.9 10*3/uL — AB (ref 3.8–10.6)

## 2013-10-15 LAB — PROTIME-INR
INR: 1.1
Prothrombin Time: 14.1 secs (ref 11.5–14.7)

## 2013-10-15 LAB — APTT: Activated PTT: 48 secs — ABNORMAL HIGH (ref 23.6–35.9)

## 2013-10-15 NOTE — Telephone Encounter (Signed)
Patient contacted regarding discharge from [facility] on 10/15/13.  Patient understands to follow up with Dr. Rockey Situ on 10/21/13 at 9:45 at [facility]. Patient understands discharge instructions? yes Patient understands medications and regiment? yes Patient understands to bring all medications to this visit? yes

## 2013-10-21 ENCOUNTER — Encounter: Payer: Self-pay | Admitting: Cardiovascular Disease

## 2013-10-21 ENCOUNTER — Ambulatory Visit (INDEPENDENT_AMBULATORY_CARE_PROVIDER_SITE_OTHER): Payer: Medicare Other | Admitting: Cardiovascular Disease

## 2013-10-21 VITALS — BP 173/94 | HR 99 | Ht 73.0 in | Wt 224.5 lb

## 2013-10-21 DIAGNOSIS — I5032 Chronic diastolic (congestive) heart failure: Secondary | ICD-10-CM

## 2013-10-21 DIAGNOSIS — Z9889 Other specified postprocedural states: Secondary | ICD-10-CM

## 2013-10-21 DIAGNOSIS — Z8679 Personal history of other diseases of the circulatory system: Secondary | ICD-10-CM | POA: Insufficient documentation

## 2013-10-21 DIAGNOSIS — I25810 Atherosclerosis of coronary artery bypass graft(s) without angina pectoris: Secondary | ICD-10-CM

## 2013-10-21 DIAGNOSIS — I4891 Unspecified atrial fibrillation: Secondary | ICD-10-CM

## 2013-10-21 DIAGNOSIS — I509 Heart failure, unspecified: Secondary | ICD-10-CM

## 2013-10-21 DIAGNOSIS — I10 Essential (primary) hypertension: Secondary | ICD-10-CM

## 2013-10-21 MED ORDER — NITROGLYCERIN 0.4 MG SL SUBL: 0.4000 mg | SUBLINGUAL_TABLET | SUBLINGUAL | Status: AC | PRN

## 2013-10-21 NOTE — Assessment & Plan Note (Signed)
Blood pressure is well controlled on today's visit. No changes made to the medications. 

## 2013-10-21 NOTE — Assessment & Plan Note (Signed)
Chronic atrial fibrillation on eliquis 5 mg twice a day. Rate relatively well-controlled

## 2013-10-21 NOTE — Patient Instructions (Signed)
You are doing well. No medication changes were made.  Take lasix as needed for shortness of breath and keep weight less than 220 pounds  Please call us if you have new issues that need to be addressed before your next appt.  Your physician wants you to follow-up in: 3 months.  You will receive a reminder letter in the mail two months in advance. If you don't receive a letter, please call our office to schedule the follow-up appointment.

## 2013-10-21 NOTE — Assessment & Plan Note (Signed)
Recommended he take Lasix periodically, preemptively before he gets short of breath. Moderate pulmonary hypertension on echo 2 weeks ago

## 2013-10-21 NOTE — Progress Notes (Signed)
Patient ID: Javier [name]., male    DOB: July 02, 1936, 78 y.o.   MRN: [id number]  HPI Comments: 78 year old male with history of smoking 40 years, severe asymmetric LVH with moderate LVOT gradient,  COPD, coronary artery disease with bypass surgery in 2007, occlusion of 2 vein grafts with PCI x4, peripheral vascular disease with bilateral carotid endarterectomies in 2002, history of TIA who has presented several times to [facility] with COPD exacerbation,  TIA, frequent episodse of COPD exacerbation/bronchitis often needing long courses of antibiotics and steroids , most recent cardiac catheterization at [facility] showing patent vessels with no significant progression of his disease, who presents for routine followup . He was previously found to be resistant to Plavix by P2Y12 assay  at [place] hospitalization records reviewed In followup today, he had recent hospitalization for TIA followed by discovery of large abdominal aortic aneurysm. Estimated at more than 5 cm He was kept in the hospital and had endograft placement for history related by Dr. Ronalee Belts. He did well through the procedure. Diuretics were not given secondary to 2 catheterizations with IV contrast . In followup today, no residual TIA symptoms. He is ambulating without any difficulty. He denies any significant shortness of breath on today's visit. BNP 3400, TSH 0.158 Total cholesterol 154, LDL 65, HDL 48 Echocardiogram done the day of his admission showed atrial fibrillation, ejection fraction 50-55%, mild to moderate MR and TR, moderately elevated right ventricular systolic pressures   recent TIA. On October 5, he woke in the middle of the night with left arm and left leg weakness. He called 911 and was initially evaluated at [facility], and transferred to [place]. CT scan of the head, MRI did not show an acute stroke. EEG by his report was negative. He was discharged home on warfarin at the same dose. It was noted that INR was 1.79 which was  below his typical baseline in the 2 range. He was discharged on 05/25/2013.  he had a full recovery.  Repeat TIA while in our office on 06/02/2013 during his INR check. He was sent to [facility] with [facility] [place] protocol, Released after CT and head MRI.  In follow up today, he reports he is almost back to his baseline. His left arm/hand still has mild residual weakness.    hospitalization in May 2013 chest pain, COPD exacerbation. Stress test showed no ischemia.  Echocardiogram was essentially normal with mild MR. received significant IV fluids through his hospital course.  Edema got worse through the next week with significant pain in his legs. This improved on outpatient diuretics. Lasix held periodically for low blood pressure Found to be very iron deficient, very anemic. Labs show iron level XVI, hematocrit 24 BNP 400  admission to the hospital 08/31/2012 for shortness of breath. Treated for COPD exacerbation, started on Levaquin IV steroids. CT scan of the chest showing stable pulmonary nodule.   Several recent hospital admissions for orthostatic hypotension, syncope. Previous admission 02/06/2013 Last hospitalized end of July 2014 with discharge July 31 with syncope, orthostatic hypotension. Creatinine was 1.54 with BUN 30 on arrival.  Lasix has been held.   seen by hematology, Dr. Cynda Acres, suggested that he have additional testing for platelet aggregation.  He was previously not on warfarin secondary to recent GI bleeds, as well as hematuria. Stroke August 2014 while not on warfarin. He had acute onset mental status changes with garbled speech. He was given TPA on route to [facility], additional workup included carotid ultrasound,  head CT scan and MRA. He was discharged on aspirin. He reports near complete resolution of his symptoms.  Recent admission to the hospital for atypical chest pain. Admitted on 06/24/2013. He has stress test on 06/25/2013 that showed moderate ischemia in the lateral and  inferolateral wall. He underwent cardiac catheterization 06/28/2013 showing occluded saphenous vein graft to the diagonal. Abnormal stress test likely from ischemia in the [place] distribution which was not bypassed. No intervention was performed. In followup today, he feels well with no complaints. Breathing is good, weight has been increasing over the past several weeks, likely exacerbated from IV fluids in the hospital. Recent INR 2.5 today. No further TIA or stroke-type symptoms  Prior Carotid ultrasound done in the hospital 01/12/2012 shows no significant carotid disease , repeat study August 2014  EKG shows atrial fibrillation with rate 82 beats per minute, nonspecific ST and T wave abnormality   Outpatient Encounter Prescriptions as of 10/21/2013  Medication Sig  . albuterol (VENTOLIN HFA) 108 (90 BASE) MCG/ACT inhaler Inhale 2 puffs into the lungs every 6 (six) hours as needed for wheezing.  Marland Kitchen ALPRAZolam (XANAX) 0.25 MG tablet Take 0.25 mg by mouth every 6 (six) hours as needed for anxiety.   Marland Kitchen apixaban (ELIQUIS) 5 MG TABS tablet Take 5 mg by mouth 2 (two) times daily.  . Ascorbic Acid (VITAMIN C) 1000 MG tablet Take 1,000 mg by mouth daily.  Marland Kitchen aspirin EC 81 MG tablet Take 81 mg by mouth daily.  . budesonide-formoterol (SYMBICORT) 160-4.5 MCG/ACT inhaler Inhale 2 puffs into the lungs 2 (two) times daily.   Marland Kitchen docusate sodium (COLACE) 100 MG capsule Take 100 mg by mouth 2 (two) times daily.  . Ferrous Sulfate (IRON) 325 (65 FE) MG TABS Take 325 mg by mouth 2 (two) times daily.   . fludrocortisone (FLORINEF) 0.1 MG tablet Take 1 tablet (0.1 mg total) by mouth daily.  . furosemide (LASIX) 20 MG tablet Take 20 mg by mouth daily as needed for fluid or edema.  Marland Kitchen HYDROcodone-acetaminophen (NORCO/VICODIN) 5-325 MG per tablet Take 1 tablet by mouth every 6 (six) hours as needed for moderate pain.  Marland Kitchen ipratropium-albuterol (DUONEB) 0.5-2.5 (3) MG/3ML SOLN Take 3 mLs by nebulization every 6 (six) hours as  needed (for shortness of breath).   Marland Kitchen leuprolide, 6 Month, (ELIGARD) 45 MG injection Inject 45 mg into the skin every 6 (six) months.  . metoprolol (LOPRESSOR) 50 MG tablet Take 50 mg by mouth 2 (two) times daily.  . montelukast (SINGULAIR) 10 MG tablet Take 10 mg by mouth at bedtime.   . nitroGLYCERIN (NITROSTAT) 0.4 MG SL tablet Place 0.4 mg under the tongue every 5 (five) minutes as needed for chest pain.  Marland Kitchen omega-3 acid ethyl esters (LOVAZA) 1 G capsule Take 1 g by mouth 2 (two) times daily.   Marland Kitchen omeprazole (PRILOSEC) 20 MG capsule Take 20 mg by mouth 2 (two) times daily before a meal.   . potassium chloride (K-DUR) 10 MEQ tablet Take 10 mEq by mouth daily as needed (takes with lasix).  . rosuvastatin (CRESTOR) 20 MG tablet Take 20 mg by mouth every evening.   . senna (SENOKOT) 8.6 MG tablet Take 1 tablet by mouth daily.  . sertraline (ZOLOFT) 50 MG tablet Take 50 mg by mouth daily.  Marland Kitchen tiotropium (SPIRIVA) 18 MCG inhalation capsule Place 18 mcg into inhaler and inhale daily.   . traMADol-acetaminophen (ULTRACET) 37.5-325 MG per tablet Take 1 tablet by mouth every 4 (four) hours as  needed.     Review of Systems  Constitutional: Negative.   HENT: Negative.   Eyes: Negative.   Respiratory: Negative.   Cardiovascular: Negative.   Gastrointestinal: Negative.   Endocrine: Negative.   Musculoskeletal: Negative.   Skin: Negative.   Allergic/Immunologic: Negative.   Neurological: Negative.        Mild left arm and hand weakness  Hematological: Negative.   Psychiatric/Behavioral: Negative.   All other systems reviewed and are negative.   BP 173/94  Pulse 99  Ht 6\' 1"  (1.854 m)  Wt 224 lb 8 oz (101.833 kg)  BMI 29.63 kg/m2 Recheck of his blood pressure systolic 381 Physical Exam  Nursing note and vitals reviewed. Constitutional: He is oriented to person, place, and time. He appears well-developed and well-nourished.  HENT:  Head: Normocephalic.  Nose: Nose normal.  Mouth/Throat:  Oropharynx is clear and moist.  Eyes: Conjunctivae are normal. Pupils are equal, round, and reactive to light.  Neck: Normal range of motion. Neck supple. No JVD present.  Cardiovascular: Normal rate, S1 normal, S2 normal and intact distal pulses.  An irregularly irregular rhythm present. Exam reveals no gallop and no friction rub.   Murmur heard.  Crescendo systolic murmur is present with a grade of 2/6  Trace pitting above the sock line bilaterally  Pulmonary/Chest: Effort normal. No respiratory distress. He has decreased breath sounds. He has no wheezes. He has no rales. He exhibits no tenderness.  Abdominal: Soft. Bowel sounds are normal. He exhibits no distension. There is no tenderness.  Musculoskeletal: Normal range of motion. He exhibits no edema and no tenderness.  Lymphadenopathy:    He has no cervical adenopathy.  Neurological: He is alert and oriented to person, place, and time. Coordination normal.  Skin: Skin is warm and dry. No rash noted. No erythema.  Psychiatric: He has a normal mood and affect. His behavior is normal. Judgment and thought content normal.      Assessment and Plan

## 2013-10-21 NOTE — Assessment & Plan Note (Signed)
Currently with no symptoms of angina. No further workup at this time. Continue current medication regimen. 

## 2013-10-21 NOTE — Assessment & Plan Note (Signed)
He is a well following his aneurysm repair. He has followup with Dr. Ronalee Belts.

## 2013-10-25 ENCOUNTER — Emergency Department: Payer: Self-pay | Admitting: Emergency Medicine

## 2013-10-25 LAB — COMPREHENSIVE METABOLIC PANEL
ALT: 17 U/L (ref 12–78)
ANION GAP: 7 (ref 7–16)
Albumin: 3.3 g/dL — ABNORMAL LOW (ref 3.4–5.0)
Alkaline Phosphatase: 82 U/L
BUN: 11 mg/dL (ref 7–18)
Bilirubin,Total: 0.5 mg/dL (ref 0.2–1.0)
Calcium, Total: 8.5 mg/dL (ref 8.5–10.1)
Chloride: 107 mmol/L (ref 98–107)
Co2: 27 mmol/L (ref 21–32)
Creatinine: 1.01 mg/dL (ref 0.60–1.30)
EGFR (African American): >60
EGFR (Non-African Amer.): >60
Glucose: 87 mg/dL (ref 65–99)
Osmolality: 280 (ref 275–301)
Potassium: 3.7 mmol/L (ref 3.5–5.1)
SGOT(AST): 24 U/L (ref 15–37)
SODIUM: 141 mmol/L (ref 136–145)
TOTAL PROTEIN: 7.1 g/dL (ref 6.4–8.2)

## 2013-10-25 LAB — URINALYSIS, COMPLETE
BACTERIA: NONE SEEN
BILIRUBIN, UR: NEGATIVE
Blood: NEGATIVE
Glucose,UR: NEGATIVE mg/dL (ref 0–75)
KETONE: NEGATIVE
LEUKOCYTE ESTERASE: NEGATIVE
NITRITE: NEGATIVE
PH: 5 (ref 4.5–8.0)
Protein: NEGATIVE
RBC,UR: 1 /HPF (ref 0–5)
Specific Gravity: 1.018 (ref 1.003–1.030)
Squamous Epithelial: 1
WBC UR: 1 /HPF (ref 0–5)

## 2013-10-25 LAB — CBC WITH DIFFERENTIAL/PLATELET
BASOS PCT: 0.3 %
Basophil #: 0 10*3/uL (ref 0.0–0.1)
Eosinophil #: 0 10*3/uL (ref 0.0–0.7)
Eosinophil %: 0.2 %
HCT: 33.2 % — ABNORMAL LOW (ref 40.0–52.0)
HGB: 11 g/dL — ABNORMAL LOW (ref 13.0–18.0)
LYMPHS PCT: 8.8 %
Lymphocyte #: 1.2 10*3/uL (ref 1.0–3.6)
MCH: 29.7 pg (ref 26.0–34.0)
MCHC: 33 g/dL (ref 32.0–36.0)
MCV: 90 fL (ref 80–100)
MONOS PCT: 6.3 %
Monocyte #: 0.8 x10 3/mm (ref 0.2–1.0)
NEUTROS ABS: 11.3 10*3/uL — AB (ref 1.4–6.5)
Neutrophil %: 84.4 %
Platelet: 284 10*3/uL (ref 150–440)
RBC: 3.69 10*6/uL — AB (ref 4.40–5.90)
RDW: 15.5 % — AB (ref 11.5–14.5)
WBC: 13.4 10*3/uL — ABNORMAL HIGH (ref 3.8–10.6)

## 2013-10-25 LAB — TROPONIN I: Troponin-I: <0.02 ng/mL

## 2013-10-25 LAB — LIPASE, BLOOD: Lipase: 75 U/L (ref 73–393)

## 2013-10-30 LAB — BASIC METABOLIC PANEL
ANION GAP: 6 — AB (ref 7–16)
BUN: 8 mg/dL (ref 7–18)
CHLORIDE: 104 mmol/L (ref 98–107)
Calcium, Total: 9 mg/dL (ref 8.5–10.1)
Co2: 30 mmol/L (ref 21–32)
Creatinine: 1.09 mg/dL (ref 0.60–1.30)
EGFR (Non-African Amer.): >60
GLUCOSE: 108 mg/dL — AB (ref 65–99)
Osmolality: 278 (ref 275–301)
Potassium: 3.3 mmol/L — ABNORMAL LOW (ref 3.5–5.1)
SODIUM: 140 mmol/L (ref 136–145)

## 2013-10-30 LAB — TROPONIN I
Troponin-I: <0.02 ng/mL
Troponin-I: <0.02 ng/mL

## 2013-10-30 LAB — CBC
HCT: 34.5 % — ABNORMAL LOW (ref 40.0–52.0)
HGB: 11.2 g/dL — AB (ref 13.0–18.0)
MCH: 29.3 pg (ref 26.0–34.0)
MCHC: 32.5 g/dL (ref 32.0–36.0)
MCV: 90 fL (ref 80–100)
PLATELETS: 216 10*3/uL (ref 150–440)
RBC: 3.84 10*6/uL — AB (ref 4.40–5.90)
RDW: 15.2 % — AB (ref 11.5–14.5)
WBC: 11.5 10*3/uL — ABNORMAL HIGH (ref 3.8–10.6)

## 2013-10-30 LAB — MAGNESIUM: MAGNESIUM: 1.6 mg/dL — AB

## 2013-10-30 LAB — PRO B NATRIURETIC PEPTIDE: B-TYPE NATIURETIC PEPTID: 5735 pg/mL — AB (ref 0–450)

## 2013-10-31 DIAGNOSIS — J449 Chronic obstructive pulmonary disease, unspecified: Secondary | ICD-10-CM

## 2013-10-31 DIAGNOSIS — I2510 Atherosclerotic heart disease of native coronary artery without angina pectoris: Secondary | ICD-10-CM

## 2013-10-31 DIAGNOSIS — I4891 Unspecified atrial fibrillation: Secondary | ICD-10-CM

## 2013-10-31 DIAGNOSIS — I5033 Acute on chronic diastolic (congestive) heart failure: Secondary | ICD-10-CM

## 2013-10-31 LAB — TROPONIN I: Troponin-I: <0.02 ng/mL

## 2013-11-01 ENCOUNTER — Inpatient Hospital Stay: Payer: Self-pay | Admitting: Internal Medicine

## 2013-11-01 LAB — BASIC METABOLIC PANEL
Anion Gap: 5 — ABNORMAL LOW (ref 7–16)
BUN: 16 mg/dL (ref 7–18)
CREATININE: 1.06 mg/dL (ref 0.60–1.30)
Calcium, Total: 8.7 mg/dL (ref 8.5–10.1)
Chloride: 101 mmol/L (ref 98–107)
Co2: 32 mmol/L (ref 21–32)
EGFR (Non-African Amer.): >60
Glucose: 95 mg/dL (ref 65–99)
OSMOLALITY: 277 (ref 275–301)
POTASSIUM: 3 mmol/L — AB (ref 3.5–5.1)
SODIUM: 138 mmol/L (ref 136–145)

## 2013-11-02 DIAGNOSIS — I4891 Unspecified atrial fibrillation: Secondary | ICD-10-CM

## 2013-11-02 DIAGNOSIS — I5033 Acute on chronic diastolic (congestive) heart failure: Secondary | ICD-10-CM

## 2013-11-02 LAB — BASIC METABOLIC PANEL
Anion Gap: 2 — ABNORMAL LOW (ref 7–16)
BUN: 16 mg/dL (ref 7–18)
Calcium, Total: 9.2 mg/dL (ref 8.5–10.1)
Chloride: 100 mmol/L (ref 98–107)
Co2: 37 mmol/L — ABNORMAL HIGH (ref 21–32)
Creatinine: 1.37 mg/dL — ABNORMAL HIGH (ref 0.60–1.30)
EGFR (Non-African Amer.): 50 — ABNORMAL LOW
GFR CALC AF AMER: 58 — AB
GLUCOSE: 89 mg/dL (ref 65–99)
Osmolality: 278 (ref 275–301)
POTASSIUM: 4.3 mmol/L (ref 3.5–5.1)
Sodium: 139 mmol/L (ref 136–145)

## 2013-11-03 ENCOUNTER — Telehealth: Payer: Self-pay

## 2013-11-03 ENCOUNTER — Ambulatory Visit: Payer: Self-pay | Admitting: Cardiovascular Disease

## 2013-11-03 DIAGNOSIS — I4891 Unspecified atrial fibrillation: Secondary | ICD-10-CM

## 2013-11-03 DIAGNOSIS — I639 Cerebral infarction, unspecified: Secondary | ICD-10-CM

## 2013-11-03 DIAGNOSIS — Z7901 Long term (current) use of anticoagulants: Secondary | ICD-10-CM

## 2013-11-03 MED ORDER — MIDODRINE HCL 5 MG PO TABS: 5.0000 mg | ORAL_TABLET | Freq: Three times a day (TID) | ORAL | Status: AC | PRN

## 2013-11-03 NOTE — Telephone Encounter (Signed)
Nurse with [facility] has some concerns about pt BP. 92/60 sitting, standing 88/60, sitting 88/60. Please call.

## 2013-11-03 NOTE — Telephone Encounter (Signed)
Spoke w/ Dr. Rockey Situ who advises that pt hold lasix for 1 day, then take 20mg  QOD. When BP runs low, pt is to sit and drink a small amt of fluid. Sent rx for midodrine 5mg  TID prn if systolic < 638 Pt to continue on florinef. If SOB, increase O2 up to 3 L and take lasix. If pt's BP continue to drop on bisoprolol and resume metoprolol.  Javier [name] of Dr. Donivan Scull recommendations. She verbalizes understanding and is agreeable to this.

## 2013-11-03 NOTE — Telephone Encounter (Signed)
Spoke w/ Jeani Hawking, RN w/ [facility] who reports pt's BP is running unusually low for him. 92/60 in left arm while sitting, 88/60 standing, pt denies any dizziness. After sitting and talking for a little while, she rechecked sitting 88/60. Reports that pt's metoprolol was stopped during last hospital admission, changed to bisprolol 5mg  BID and lasix 40 mg daily. Jeani Hawking advised pt to hold today's dose of bisoprolol and await further instructions from Dr. Rockey Situ. Please advise.  Thank you/

## 2013-11-05 ENCOUNTER — Telehealth: Payer: Self-pay | Admitting: *Deleted

## 2013-11-05 NOTE — Telephone Encounter (Signed)
Spoke w/ Javier [name].  She reports that pt's lasix dosing was changed to QOD in the hospital, but K+ was not changed.  Pt was previously taking w/ lasix and she needs clarification if he is to continue to do this.  Please advise.  Thank you!

## 2013-11-05 NOTE — Telephone Encounter (Signed)
Left message for pt to call back  °

## 2013-11-05 NOTE — Telephone Encounter (Signed)
Javier [name] called Dr. Rockey Situ needs instructions to taking potassium. Please advise

## 2013-11-05 NOTE — Telephone Encounter (Signed)
Spoke w/ Jeani Hawking.  Advised her of Dr. Donivan Scull recommendation.  She reports that pt's BP is still low, pt to monitor over the weekend.  Jeani Hawking is scheduled to go see pt on Monday and will call and let us know how his numbers are.

## 2013-11-05 NOTE — Telephone Encounter (Signed)
Would take potassium only with Lasix Can we call him today and on Monday to see how he is doing  Would tell him to call us with any new symptoms Also track his daily weight for [place] as well as blood pressures

## 2013-11-09 ENCOUNTER — Telehealth: Payer: Self-pay

## 2013-11-09 NOTE — Telephone Encounter (Signed)
Will evaluate this Friday Need BP and heart rate measurements May need bloodwork with PMD

## 2013-11-09 NOTE — Telephone Encounter (Signed)
Called pt to check on his status.  Spoke w/ pt's wife. She reports that pt has been sleeping more lately, fell asleep while talking to her yesterday. States that the family went out eat on Sat night to get him out of the house. He ordered a steak, baked potato and salad.  Reports that he hardly ate anything, was quiet throughout the meal, and he was "completely wiped out after". Reports that he went to bed as soon as they got home. Reports that his wt has remained about the same, but that her scales and [facility]'s scales are getting different readings. States that home scales indicate he has lost wt, but [facility] indicate he has gained wt, so they are unclear on this.  Reports that pt does not eat a lot anymore and made the comment that "he doesn't care anymore about anything". Reports that Jeani Hawking, RN w/ [facility] spoke w/ her about this and advised her and family to display positive thoughts and not to leave pt alone. Family's main concern is how much pt is sleeping now and stating that he is tired. States that she thinks some of his meds were changed, but she does not know what, if anything, it was. Pt has appt w/ PCP on Thurs and Dr. Rockey Situ on Fri this week.  Left message for Jeani Hawking, RN to call back to discuss.

## 2013-11-10 NOTE — Telephone Encounter (Signed)
Spoke w/ Jeani Hawking.  She states that she is very concerned about pt, as his lethargy and sleepiness is completely out of character for him. She reports that pt made the comment to his wife the other day that "he is tired and ready to go". She states that pt was recently changed from metoprolol to bisoprolol and she wonders if this may be making him feel bad. She is on her way to pt's house today.  Advised her to have pt write down his BP, HR and wt to bring to appt on Friday. Pt has appt w/ PCP and Dr. Raul Del tomorrow. She is agreeable to this and will call w/ further questions or concerns.

## 2013-11-12 ENCOUNTER — Encounter: Payer: Medicare Other | Admitting: Cardiovascular Disease

## 2013-11-12 ENCOUNTER — Encounter: Payer: Medicare Other | Admitting: Physician Assistant

## 2013-11-24 ENCOUNTER — Encounter: Payer: Self-pay | Admitting: Cardiovascular Disease

## 2013-11-24 ENCOUNTER — Ambulatory Visit (INDEPENDENT_AMBULATORY_CARE_PROVIDER_SITE_OTHER): Payer: Medicare Other | Admitting: Cardiovascular Disease

## 2013-11-24 VITALS — BP 132/90 | HR 91 | Ht 73.0 in | Wt 221.5 lb

## 2013-11-24 DIAGNOSIS — I25810 Atherosclerosis of coronary artery bypass graft(s) without angina pectoris: Secondary | ICD-10-CM

## 2013-11-24 DIAGNOSIS — I509 Heart failure, unspecified: Secondary | ICD-10-CM

## 2013-11-24 DIAGNOSIS — I4891 Unspecified atrial fibrillation: Secondary | ICD-10-CM

## 2013-11-24 DIAGNOSIS — I5032 Chronic diastolic (congestive) heart failure: Secondary | ICD-10-CM

## 2013-11-24 DIAGNOSIS — E785 Hyperlipidemia, unspecified: Secondary | ICD-10-CM

## 2013-11-24 DIAGNOSIS — I951 Orthostatic hypotension: Secondary | ICD-10-CM

## 2013-11-24 MED ORDER — FUROSEMIDE 40 MG PO TABS
40.0000 mg | ORAL_TABLET | Freq: Two times a day (BID) | ORAL | Status: DC | PRN
Start: 2013-11-24 — End: 2013-12-23

## 2013-11-24 NOTE — Assessment & Plan Note (Addendum)
We have recommended he follow the regimen below  For weight >220, take lasix 40 mg twice a day (Am and lunch time) Please take lasix 40 mg a day for weight greater than 216 pounds Take lasix 20 mg a day for weight 213 to 215 pounds Take lasix 20 mg every other day for weight <213 pounds

## 2013-11-24 NOTE — Assessment & Plan Note (Signed)
Cholesterol is at goal on the current lipid regimen. No changes to the medications were made.  

## 2013-11-24 NOTE — Assessment & Plan Note (Signed)
Chronic atrial fibrillation. Doing well on eliquis 5 mg twice a day

## 2013-11-24 NOTE — Assessment & Plan Note (Signed)
Currently with no symptoms of angina. No further workup at this time. Continue current medication regimen. 

## 2013-11-24 NOTE — Patient Instructions (Addendum)
You are doing well. No medication changes were made.  For weight >220, take lasix 40 mg twice a day (Am and lunch time) Please take lasix 40 mg a day for weight greater than 216 pounds Take lasix 20 mg a day for weight 213 to 215 pounds Take lasix 20 mg every other day for weight <213 pounds   Please call us if you have new issues that need to be addressed before your next appt.  Your physician wants you to follow-up in: 1 month.

## 2013-11-24 NOTE — Progress Notes (Signed)
Patient ID: Javier [name]., male    DOB: 1936/01/27, 78 y.o.   MRN: [id number]  HPI Comments: 78 year old male with history of smoking 40 years, severe asymmetric LVH with moderate LVOT gradient,  COPD, coronary artery disease with bypass surgery in 2007, occlusion of 2 vein grafts with PCI x4, peripheral vascular disease with bilateral carotid endarterectomies in 2002, history of TIA who has presented several times to [facility] with COPD exacerbation,  TIA, frequent episodse of COPD exacerbation/bronchitis often needing long courses of antibiotics and steroids , most recent cardiac catheterization at [facility] showing patent vessels with no significant progression of his disease, who presents for routine followup . He was previously found to be resistant to Plavix by P2Y12 assay  at [place] hospitalization records reviewed In followup today, he had recent hospitalization for TIA followed by discovery of large abdominal aortic aneurysm. Estimated at more than 5 cm He was kept in the hospital and had endograft placement for history related by Dr. Ronalee Belts. He did well through the procedure. Diuretics were not given secondary to 2 catheterizations with IV contrast . In followup today, no residual TIA symptoms. He is ambulating without any difficulty. He denies any significant shortness of breath on today's visit. BNP 3400, TSH 0.158 Total cholesterol 154, LDL 65, HDL 48 Echocardiogram done the day of his admission showed atrial fibrillation, ejection fraction 50-55%, mild to moderate MR and TR, moderately elevated right ventricular systolic pressures   recent TIA. On October 5, he woke in the middle of the night with left arm and left leg weakness. He called 911 and was initially evaluated at [facility], and transferred to [place]. CT scan of the head, MRI did not show an acute stroke. EEG by his report was negative. He was discharged home on warfarin at the same dose. It was noted that INR was 1.79 which was  below his typical baseline in the 2 range. He was discharged on 05/25/2013.  he had a full recovery.  Repeat TIA while in our office on 06/02/2013 during his INR check. He was sent to [facility] with [facility] [place] protocol, Released after CT and head MRI.  In follow up today, he reports he is almost back to his baseline. His left arm/hand still has mild residual weakness.    hospitalization in May 2013 chest pain, COPD exacerbation. Stress test showed no ischemia.  Echocardiogram was essentially normal with mild MR. received significant IV fluids through his hospital course.  Edema got worse through the next week with significant pain in his legs. This improved on outpatient diuretics. Lasix held periodically for low blood pressure Found to be very iron deficient, very anemic. Labs show iron level XVI, hematocrit 24 BNP 400  admission to the hospital 08/31/2012 for shortness of breath. Treated for COPD exacerbation, started on Levaquin IV steroids. CT scan of the chest showing stable pulmonary nodule.   Several recent hospital admissions for orthostatic hypotension, syncope. Previous admission 02/06/2013 Last hospitalized end of July 2014 with discharge July 31 with syncope, orthostatic hypotension. Creatinine was 1.54 with BUN 30 on arrival.  Lasix has been held.   seen by hematology, Dr. Cynda Acres, suggested that he have additional testing for platelet aggregation.  He was previously not on warfarin secondary to recent GI bleeds, as well as hematuria. Stroke August 2014 while not on warfarin. He had acute onset mental status changes with garbled speech. He was given TPA on route to [facility], additional workup included carotid ultrasound,  head CT scan and MRA. He was discharged on aspirin. He reports near complete resolution of his symptoms.   hospital for atypical chest pain. Admitted on 06/24/2013. He has stress test on 06/25/2013 that showed moderate ischemia in the lateral and inferolateral wall. He  underwent cardiac catheterization 06/28/2013 showing occluded saphenous vein graft to the diagonal. Abnormal stress test likely from ischemia in the [place] distribution which was not bypassed. No intervention was performed. In followup today, he feels well with no complaints. Breathing is good, weight has been increasing over the past several weeks, likely exacerbated from IV fluids in the hospital. Recent INR 2.5 today. No further TIA or stroke-type symptoms  Recent hospitalization March 16 with discharge 11/02/2013 with acute on chronic diastolic CHF. Treated with IV Lasix with improvement of his symptoms. Since discharge weight has ranged from 212 pounds up to 220 pounds, 218 pounds at home today. Denies having any significant shortness of breath or leg edema. Continues to feel weak, slowly getting his strength back Recent hematocrit 11/12/2011 showing hemoglobin 12, hematocrit 37  Prior Carotid ultrasound done in the hospital 01/12/2012 shows no significant carotid disease , repeat study August 2014 EKG shows atrial fibrillation with ventricular rate 91 beats per minute, rare PVC T wave abnormality in lead 3, aVF   Outpatient Encounter Prescriptions as of 11/24/2013  Medication Sig  . albuterol (VENTOLIN HFA) 108 (90 BASE) MCG/ACT inhaler Inhale 2 puffs into the lungs every 6 (six) hours as needed for wheezing.  Marland Kitchen ALPRAZolam (XANAX) 0.25 MG tablet Take 0.25 mg by mouth every 6 (six) hours as needed for anxiety.   Marland Kitchen apixaban (ELIQUIS) 5 MG TABS tablet Take 5 mg by mouth 2 (two) times daily.  . Ascorbic Acid (VITAMIN C) 1000 MG tablet Take 1,000 mg by mouth daily.  Marland Kitchen aspirin EC 81 MG tablet Take 81 mg by mouth daily.  . bisoprolol (ZEBETA) 5 MG tablet Take 5 mg by mouth 2 (two) times daily as needed.  . budesonide-formoterol (SYMBICORT) 160-4.5 MCG/ACT inhaler Inhale 2 puffs into the lungs 2 (two) times daily.   Marland Kitchen docusate sodium (COLACE) 100 MG capsule Take 100 mg by mouth 2 (two) times daily.  .  Ferrous Sulfate (IRON) 325 (65 FE) MG TABS Take 325 mg by mouth 2 (two) times daily.   . fludrocortisone (FLORINEF) 0.1 MG tablet Take 1 tablet (0.1 mg total) by mouth daily.  . furosemide (LASIX) 40 MG tablet Take 1 tablet (40 mg total) by mouth 2 (two) times daily as needed.  Marland Kitchen HYDROcodone-acetaminophen (NORCO/VICODIN) 5-325 MG per tablet Take 1 tablet by mouth every 6 (six) hours as needed for moderate pain.  Marland Kitchen ipratropium-albuterol (DUONEB) 0.5-2.5 (3) MG/3ML SOLN Take 3 mLs by nebulization every 6 (six) hours as needed (for shortness of breath).   Marland Kitchen leuprolide, 6 Month, (ELIGARD) 45 MG injection Inject 45 mg into the skin every 6 (six) months.  . metoprolol (LOPRESSOR) 50 MG tablet Take 50 mg by mouth 2 (two) times daily.  . midodrine (PROAMATINE) 5 MG tablet Take 1 tablet (5 mg total) by mouth 3 (three) times daily as needed (for low blood pressure).  . montelukast (SINGULAIR) 10 MG tablet Take 10 mg by mouth at bedtime.   . nitroGLYCERIN (NITROSTAT) 0.4 MG SL tablet Place 1 tablet (0.4 mg total) under the tongue every 5 (five) minutes as needed for chest pain.  Marland Kitchen omega-3 acid ethyl esters (LOVAZA) 1 G capsule Take 1 g by mouth 2 (two) times daily.   Marland Kitchen  pantoprazole (PROTONIX) 40 MG tablet Take 40 mg by mouth 2 (two) times daily.   . potassium chloride (K-DUR) 10 MEQ tablet Take 10 mEq by mouth every other day.   . rosuvastatin (CRESTOR) 20 MG tablet Take 20 mg by mouth every evening.   . senna (SENOKOT) 8.6 MG tablet Take 1 tablet by mouth daily.  . sertraline (ZOLOFT) 50 MG tablet Take 50 mg by mouth daily.  Marland Kitchen tiotropium (SPIRIVA) 18 MCG inhalation capsule Place 18 mcg into inhaler and inhale daily.      Review of Systems  Constitutional: Negative.   HENT: Negative.   Eyes: Negative.   Respiratory: Positive for shortness of breath.   Cardiovascular: Positive for leg swelling.  Gastrointestinal: Negative.   Endocrine: Negative.   Musculoskeletal: Negative.   Skin: Negative.    Allergic/Immunologic: Negative.   Neurological: Negative.        Mild left arm and hand weakness  Hematological: Negative.   Psychiatric/Behavioral: Negative.   All other systems reviewed and are negative.  BP 132/90  Pulse 91  Ht 6\' 1"  (1.854 m)  Wt 221 lb 8 oz (100.472 kg)  BMI 29.23 kg/m2  Physical Exam  Nursing note and vitals reviewed. Constitutional: He is oriented to person, place, and time. He appears well-developed and well-nourished.  HENT:  Head: Normocephalic.  Nose: Nose normal.  Mouth/Throat: Oropharynx is clear and moist.  Eyes: Conjunctivae are normal. Pupils are equal, round, and reactive to light.  Neck: Normal range of motion. Neck supple. No JVD present.  Cardiovascular: Normal rate, S1 normal, S2 normal and intact distal pulses.  An irregularly irregular rhythm present. Exam reveals no gallop and no friction rub.   Murmur heard.  Crescendo systolic murmur is present with a grade of 2/6  Trace pitting above the sock line bilaterally  Pulmonary/Chest: Effort normal. No respiratory distress. He has decreased breath sounds. He has no wheezes. He has no rales. He exhibits no tenderness.  Abdominal: Soft. Bowel sounds are normal. He exhibits no distension. There is no tenderness.  Musculoskeletal: Normal range of motion. He exhibits no edema and no tenderness.  Lymphadenopathy:    He has no cervical adenopathy.  Neurological: He is alert and oriented to person, place, and time. Coordination normal.  Skin: Skin is warm and dry. No rash noted. No erythema.  Psychiatric: He has a normal mood and affect. His behavior is normal. Judgment and thought content normal.      Assessment and Plan

## 2013-11-24 NOTE — Assessment & Plan Note (Addendum)
In the past has had significant problems with orthostatic hypotension leading to syncope. Seemed to stabilize on fludrocortisone. This is challenging given his heart failure episodes. We'll continue Florinef for now and didn't followup will wean his foot a cortisone as blood pressure tolerates. Will likely go to every other day to start. If blood pressure drops, midodrine could be used

## 2013-11-30 ENCOUNTER — Telehealth: Payer: Self-pay

## 2013-11-30 NOTE — Telephone Encounter (Signed)
Spoke w/ Jeani Hawking, RN.  Gave her verbal order to continue pt's home health.  She will fax over paperwork for Dr. Donivan Scull signature.

## 2013-11-30 NOTE — Telephone Encounter (Signed)
Nurse from [facility] called states his certification period for his home health is ending and needs auth for another 60 days.

## 2013-12-03 ENCOUNTER — Other Ambulatory Visit: Payer: Self-pay

## 2013-12-03 MED ORDER — BISOPROLOL FUMARATE 5 MG PO TABS: 5.0000 mg | ORAL_TABLET | Freq: Two times a day (BID) | ORAL | Status: DC | PRN

## 2013-12-08 ENCOUNTER — Telehealth: Payer: Self-pay

## 2013-12-08 NOTE — Telephone Encounter (Signed)
Jeani Hawking, RN from [facility] called stating that she is very concerned about pt.  States that she has sent 3 faxes to the office over the past week that need attention.  Advised her to call the office when pt has issues that need attention.     BP  Wt 4/10  147/102 215.4 4/11 140/90  214.8 4/12 154/93  216.6 4/13 162/94  219.2   Pt's  BP have been consistently out of range and his wt has gone from 214.8 on 4/11 to 225.8 today.  Pt has printed instructions on lasix and has been following these.   Reports pt is in no acute distress today, but she would like Dr. Donivan Scull recommendation on how to proceed.  Please advise.  Thank you.

## 2013-12-09 NOTE — Telephone Encounter (Signed)
Spoke w/ Jeani Hawking, RN w/ [facility].  Advised her of Dr. Donivan Scull recommendation to decrease fludrocortisone down to every other day.  Faxed order for this [facility] at [phone].  Jeani Hawking will notify pt of this change. She also reports that she was concerned at yesterday's visit that pt may be having a COPD flare up. Pt sched to have CXR in his home this am, per Dr. Raul Del. She will have them send results of this to our office.

## 2013-12-10 ENCOUNTER — Telehealth: Payer: Self-pay

## 2013-12-10 NOTE — Telephone Encounter (Signed)
Would not start antibiotic or prednisone unless he has coughing, worsening shortness of breath Would go clinically, not by chest x-ray in his case I am encouraged that his weight is several pounds lower

## 2013-12-10 NOTE — Telephone Encounter (Signed)
Spoke w/ Jeani Hawking, RN from [facility].  Pt had chest x-ray performed in home yesterday, due to possible COPD flare up. She is sending results to our office.  States that it reads "early mild pneumonia". She contacted Dr. Gust Brooms office.  He is out of the office until Monday, covering MD advised her that findings were not significant enough to be addressed at this time. Due to pt's h/o of complications and repeated hospitalizations, she would like to bring this to Dr. Donivan Scull attention. Pt's wife reports that pt is feeling good, his wt is down a couple of lbs, but she would like to know how to proceed. Please advise.  Thank you.

## 2013-12-10 NOTE — Telephone Encounter (Signed)
Spoke w/ Jeani Hawking.  Advised her of Dr. Donivan Scull recommendation. She is agreeable.  Advised her that if she feels that SOB is r/t fluid, he may take another lasix. She reports that pt recently completed a round of abx & prednisone. Advised her that if pt's condition worsens or if she feels that he needs immediate attention, to have him go to Urgent Care. Advised her to have pt f/u w/ pulmonary, as sx do not seem to be r/t heart. She is agreeable with this and will call if we can be of further assistance.

## 2013-12-11 IMAGING — CR DG CHEST 1V PORT
1 series · 1 of 1 positions shown · non-contrast
Comparison: none

REASON FOR EXAM: CVA
COMMENTS:

[x chest ap]
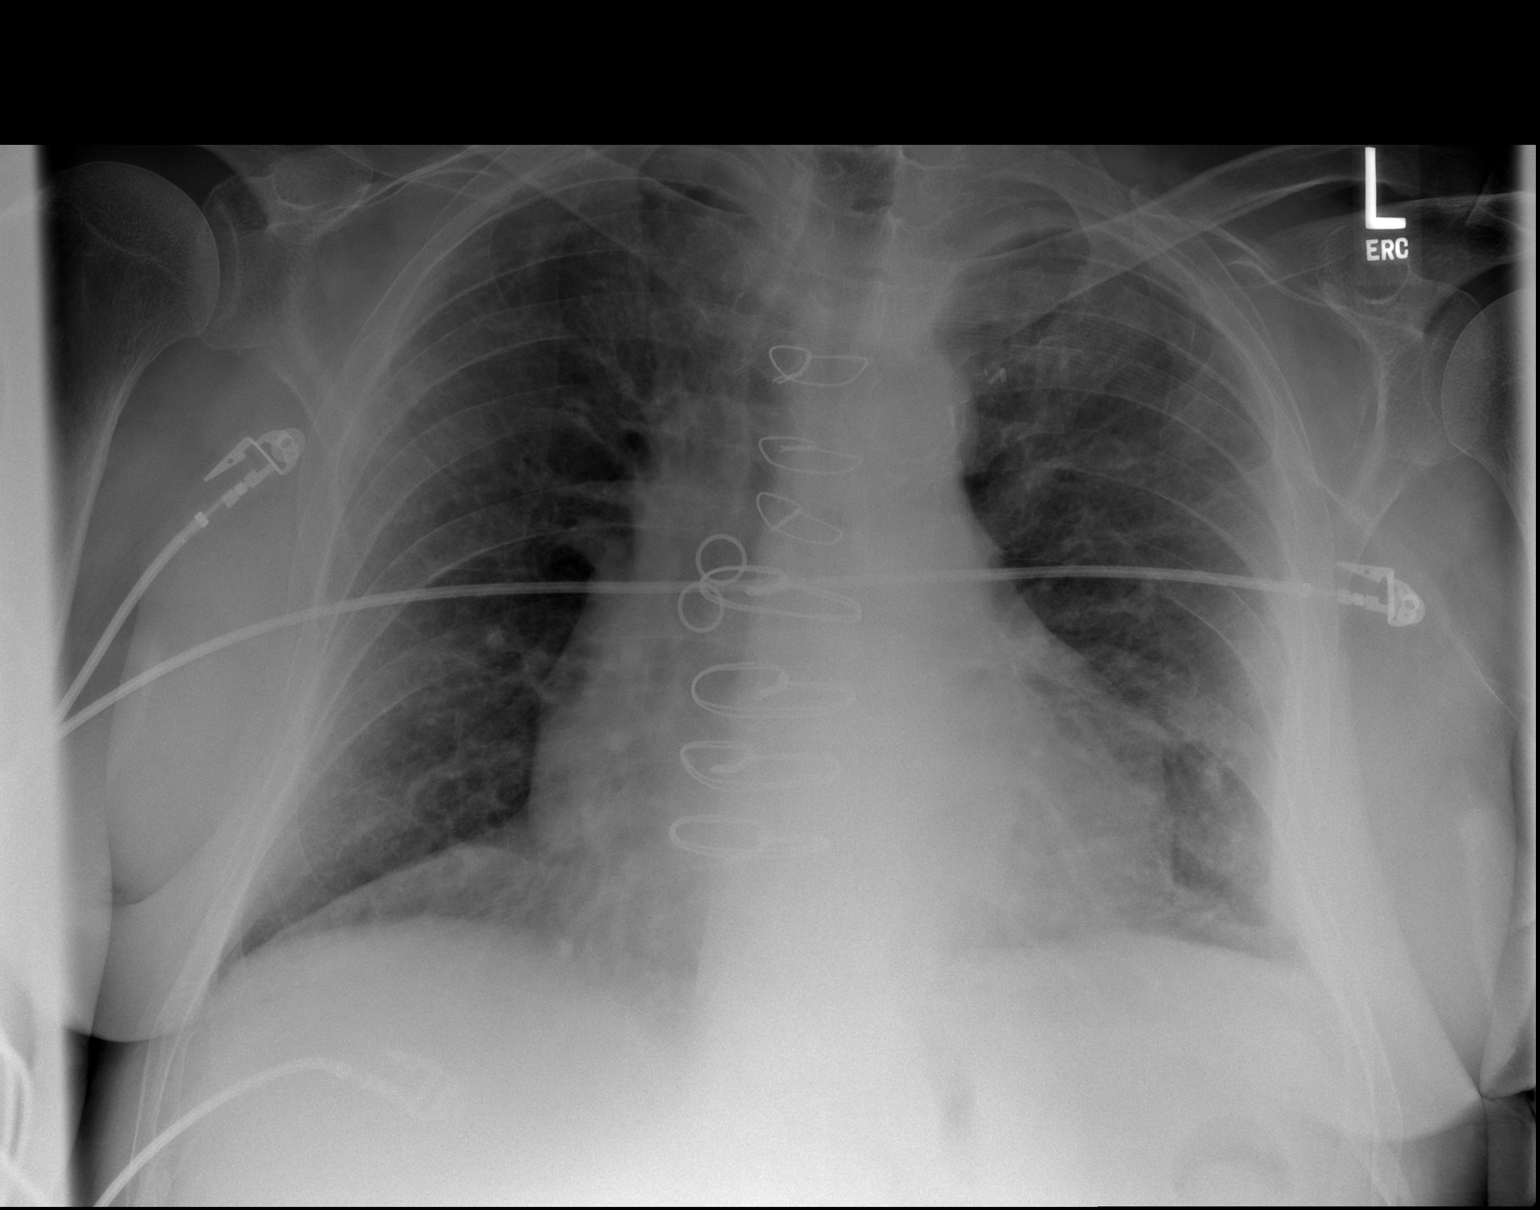

[1 of 1 positions shown; findings below may reference images not displayed]

PROCEDURE:     DXR - DXR PORTABLE CHEST SINGLE VIEW  - May 23, 2013  [DATE]

RESULT:     Comparison is made to the study May 06, 2013.

The lungs are adequately inflated. The interstitial markings remain
increased bilaterally. The cardiac silhouette remains enlarged. The
pulmonary vascularity is less prominent today. There is no pleural effusion.
The patient has undergone previous CABG.
IMPRESSION: The findings may reflect low-grade compensated CHF
superimposed upon COPD. There is no focal pneumonia.

[REDACTED]

## 2013-12-13 ENCOUNTER — Emergency Department: Payer: Self-pay | Admitting: Emergency Medicine

## 2013-12-13 LAB — CBC
HCT: 35.6 % — ABNORMAL LOW (ref 40.0–52.0)
HGB: 11.6 g/dL — AB (ref 13.0–18.0)
MCH: 30 pg (ref 26.0–34.0)
MCHC: 32.6 g/dL (ref 32.0–36.0)
MCV: 92 fL (ref 80–100)
Platelet: 142 10*3/uL — ABNORMAL LOW (ref 150–440)
RBC: 3.87 10*6/uL — ABNORMAL LOW (ref 4.40–5.90)
RDW: 14.7 % — AB (ref 11.5–14.5)
WBC: 9.7 10*3/uL (ref 3.8–10.6)

## 2013-12-13 LAB — COMPREHENSIVE METABOLIC PANEL
ALBUMIN: 3.3 g/dL — AB (ref 3.4–5.0)
ANION GAP: 5 — AB (ref 7–16)
Alkaline Phosphatase: 50 U/L
BUN: 15 mg/dL (ref 7–18)
Bilirubin,Total: 0.8 mg/dL (ref 0.2–1.0)
CHLORIDE: 105 mmol/L (ref 98–107)
Calcium, Total: 9.2 mg/dL (ref 8.5–10.1)
Co2: 31 mmol/L (ref 21–32)
Creatinine: 1.15 mg/dL (ref 0.60–1.30)
EGFR (African American): >60
EGFR (Non-African Amer.): >60
GLUCOSE: 86 mg/dL (ref 65–99)
OSMOLALITY: 281 (ref 275–301)
POTASSIUM: 3.6 mmol/L (ref 3.5–5.1)
SGOT(AST): 13 U/L — ABNORMAL LOW (ref 15–37)
SGPT (ALT): 16 U/L (ref 12–78)
Sodium: 141 mmol/L (ref 136–145)
TOTAL PROTEIN: 7.3 g/dL (ref 6.4–8.2)

## 2013-12-13 LAB — PROTIME-INR
INR: 1.1
Prothrombin Time: 14.4 secs (ref 11.5–14.7)

## 2013-12-13 LAB — CK TOTAL AND CKMB (NOT AT ARMC)
CK, TOTAL: 21 U/L — AB
CK-MB: 0.6 ng/mL (ref 0.5–3.6)

## 2013-12-13 LAB — TROPONIN I

## 2013-12-13 LAB — APTT: ACTIVATED PTT: 35.1 s (ref 23.6–35.9)

## 2013-12-13 LAB — PRO B NATRIURETIC PEPTIDE: B-Type Natriuretic Peptide: 4635 pg/mL — ABNORMAL HIGH (ref 0–450)

## 2013-12-21 ENCOUNTER — Telehealth: Payer: Self-pay

## 2013-12-21 NOTE — Telephone Encounter (Signed)
Received paperwork from [facility] w/ pt's BP readings, "Blood pressure readings continue to be out of range. Please advise.   4/15 156/102, p 89 regular 4/14 152/93,p 10? (paperwork cut off), regular 4/13 134/94, p 90 irregular 4/13 162/94, p 83 regular 4/12 154/93, p 94 regular"  Dr. Rockey Situ changed parameters of new target ranges to: Systolic BP 354/562 Diastolic BP 56-389 Pulse 37-342 Pulse oximeter 91-100  Faxed paperwork to [facility] .

## 2013-12-23 ENCOUNTER — Encounter: Payer: Self-pay | Admitting: Cardiovascular Disease

## 2013-12-23 ENCOUNTER — Ambulatory Visit (INDEPENDENT_AMBULATORY_CARE_PROVIDER_SITE_OTHER): Payer: Medicare Other | Admitting: Cardiovascular Disease

## 2013-12-23 VITALS — BP 130/80 | HR 75 | Ht 73.0 in | Wt 224.5 lb

## 2013-12-23 DIAGNOSIS — R55 Syncope and collapse: Secondary | ICD-10-CM | POA: Insufficient documentation

## 2013-12-23 DIAGNOSIS — I25810 Atherosclerosis of coronary artery bypass graft(s) without angina pectoris: Secondary | ICD-10-CM

## 2013-12-23 DIAGNOSIS — I4891 Unspecified atrial fibrillation: Secondary | ICD-10-CM

## 2013-12-23 DIAGNOSIS — I5032 Chronic diastolic (congestive) heart failure: Secondary | ICD-10-CM

## 2013-12-23 DIAGNOSIS — I509 Heart failure, unspecified: Secondary | ICD-10-CM

## 2013-12-23 DIAGNOSIS — J449 Chronic obstructive pulmonary disease, unspecified: Secondary | ICD-10-CM

## 2013-12-23 NOTE — Assessment & Plan Note (Signed)
Heart rate well-controlled. No problems on his anticoagulation

## 2013-12-23 NOTE — Patient Instructions (Addendum)
You are doing well.  For weight >223, take lasix 40 mg twice a day (Am and lunch time)  For weight 219 to 222, please take lasix 40 mg a day  For weight 215 to 218 pounds, take lasix 20 mg a day  For weight <215 pounds, Take lasix 20 mg every other day   Please call us if you have new issues that need to be addressed before your next appt.  Your physician wants you to follow-up in: 2 months.  You will receive a reminder letter in the mail two months in advance. If you don't receive a letter, please call our office to schedule the follow-up appointment.

## 2013-12-23 NOTE — Progress Notes (Signed)
Patient ID: Javier Encinas., male    DOB: 09-11-1935, 78 y.o.   MRN: [id number]  HPI Comments: 77 year old male with history of smoking 40 years, severe asymmetric LVH with moderate LVOT gradient,  COPD, coronary artery disease with bypass surgery in 2007, occlusion of 2 vein grafts with PCI x4, peripheral vascular disease with bilateral carotid endarterectomies in 2002, history of TIA who has presented several times to [facility] with COPD exacerbation,  TIA, frequent episodse of COPD exacerbation/bronchitis often needing long courses of antibiotics and steroids , most recent cardiac catheterization at [facility] showing patent vessels with no significant progression of his disease, who presents for routine followup . He was previously found to be resistant to Plavix by P2Y12 assay  at [facility]  In followup today, we discussed his recent syncope episode last week while he was seen Dr. Raul Del, pulmonary. He was sitting in the chair, feeling fine, had acute onset of syncope. Blood pressure was 88 systolic.  blood pressure improved up to 409 systolic in a supine position. Prior to that event, Florinef dosing had been decreased down to every other day. He is also taking his Lasix in an aggressive manner to maintain his weight to avoid diastolic CHF episodes. Since then his weight has trended upward now in the low 220 pound range. Previously goal weight was 214 pounds. He denies any significant shortness of breath or leg edema. Overall feels well.  No TIA or bleeding symptoms. Dr. Raul Del did give him 10 days of antibiotics for possible bronchitis.    recent hospitalization for TIA followed by discovery of large abdominal aortic aneurysm. Estimated at more than 5 cm He was kept in the hospital and had endograft placement for history related by Dr. Ronalee Belts. He did well through the procedure. Diuretics were not given secondary to 2 catheterizations with IV contrast . In followup today, no residual TIA symptoms. He is  ambulating without any difficulty. He denies any significant shortness of breath on today's visit. BNP 3400, TSH 0.158 Total cholesterol 154, LDL 65, HDL 48 Echocardiogram done the day of his admission showed atrial fibrillation, ejection fraction 50-55%, mild to moderate MR and TR, moderately elevated right ventricular systolic pressures   recent TIA. On October 5, he woke in the middle of the night with left arm and left leg weakness. He called 911 and was initially evaluated at [facility], and transferred to [place]. CT scan of the head, MRI did not show an acute stroke. EEG by his report was negative. He was discharged home on warfarin at the same dose. It was noted that INR was 1.79 which was below his typical baseline in the 2 range. He was discharged on 05/25/2013.  he had a full recovery.  Repeat TIA while in our office on 06/02/2013 during his INR check. He was sent to [facility] with Code [place] protocol, Released after CT and head MRI.  In follow up today, he reports he is almost back to his baseline. His left arm/hand still has mild residual weakness.    hospitalization in May 2013 chest pain, COPD exacerbation. Stress test showed no ischemia.  Echocardiogram was essentially normal with mild MR. received significant IV fluids through his hospital course.  Edema got worse through the next week with significant pain in his legs. This improved on outpatient diuretics. Lasix held periodically for low blood pressure Found to be very iron deficient, very anemic. Labs show iron level XVI, hematocrit 24 BNP 400  admission to the hospital  08/31/2012 for shortness of breath. Treated for COPD exacerbation, started on Levaquin IV steroids. CT scan of the chest showing stable pulmonary nodule.   Several recent hospital admissions for orthostatic hypotension, syncope. Previous admission 02/06/2013 Last hospitalized end of July 2014 with discharge July 31 with syncope, orthostatic hypotension. Creatinine  was 1.54 with BUN 30 on arrival.  Lasix has been held.   seen by hematology, Dr. Cynda Acres, suggested that he have additional testing for platelet aggregation.  He was previously not on warfarin secondary to recent GI bleeds, as well as hematuria. Stroke August 2014 while not on warfarin. He had acute onset mental status changes with garbled speech. He was given TPA on route to [facility], additional workup included carotid ultrasound, head CT scan and MRA. He was discharged on aspirin. He reports near complete resolution of his symptoms.   hospital for atypical chest pain. Admitted on 06/24/2013. He has stress test on 06/25/2013 that showed moderate ischemia in the lateral and inferolateral wall. He underwent cardiac catheterization 06/28/2013 showing occluded saphenous vein graft to the diagonal. Abnormal stress test likely from ischemia in the [place] distribution which was not bypassed. No intervention was performed. In followup today, he feels well with no complaints. Breathing is good, weight has been increasing over the past several weeks, likely exacerbated from IV fluids in the hospital. Recent INR 2.5 today. No further TIA or stroke-type symptoms  Recent hospitalization March 16 with discharge 11/02/2013 with acute on chronic diastolic CHF. Treated with IV Lasix with improvement of his symptoms. Since discharge weight has ranged from 212 pounds up to 220 pounds, 218 pounds at home today. Denies having any significant shortness of breath or leg edema. Continues to feel weak, slowly getting his strength back Recent hematocrit 11/12/2011 showing hemoglobin 12, hematocrit 37  Prior Carotid ultrasound done in the hospital 01/12/2012 shows no significant carotid disease , repeat study August 2014 EKG shows atrial fibrillation with ventricular rate 75 beats per minute,  no significant ST or T wave changes   Outpatient Encounter Prescriptions as of 12/23/2013  Medication Sig  . albuterol (VENTOLIN HFA)  108 (90 BASE) MCG/ACT inhaler Inhale 2 puffs into the lungs every 6 (six) hours as needed for wheezing.  Marland Kitchen ALPRAZolam (XANAX) 0.25 MG tablet Take 0.5 mg by mouth every 6 (six) hours as needed for anxiety.   Marland Kitchen apixaban (ELIQUIS) 5 MG TABS tablet Take 5 mg by mouth 2 (two) times daily.  . Ascorbic Acid (VITAMIN C) 1000 MG tablet Take 1,000 mg by mouth daily.  Marland Kitchen aspirin EC 81 MG tablet Take 81 mg by mouth daily.  . bisoprolol (ZEBETA) 5 MG tablet Take 5 mg by mouth 2 (two) times daily.  . budesonide-formoterol (SYMBICORT) 160-4.5 MCG/ACT inhaler Inhale 2 puffs into the lungs 2 (two) times daily.   Marland Kitchen docusate sodium (COLACE) 100 MG capsule Take 100 mg by mouth 2 (two) times daily.  . Ferrous Sulfate (IRON) 325 (65 FE) MG TABS Take 325 mg by mouth 2 (two) times daily.   . fludrocortisone (FLORINEF) 0.1 MG tablet Take 1 tablet (0.1 mg total) by mouth daily.  . furosemide (LASIX) 40 MG tablet Take 20 mg by mouth daily.  Marland Kitchen HYDROcodone-acetaminophen (NORCO/VICODIN) 5-325 MG per tablet Take 1 tablet by mouth every 6 (six) hours as needed for moderate pain.  Marland Kitchen ipratropium-albuterol (DUONEB) 0.5-2.5 (3) MG/3ML SOLN Take 3 mLs by nebulization every 6 (six) hours as needed (for shortness of breath).   Marland Kitchen leuprolide, 6 Month, (ELIGARD)  45 MG injection Inject 45 mg into the skin every 6 (six) months.  . midodrine (PROAMATINE) 5 MG tablet Take 1 tablet (5 mg total) by mouth 3 (three) times daily as needed (for low blood pressure).  . montelukast (SINGULAIR) 10 MG tablet Take 10 mg by mouth at bedtime.   . nitroGLYCERIN (NITROSTAT) 0.4 MG SL tablet Place 1 tablet (0.4 mg total) under the tongue every 5 (five) minutes as needed for chest pain.  Marland Kitchen omega-3 acid ethyl esters (LOVAZA) 1 G capsule Take 1 g by mouth 2 (two) times daily.   . pantoprazole (PROTONIX) 40 MG tablet Take 40 mg by mouth 3 (three) times daily.   . potassium chloride (K-DUR) 10 MEQ tablet Take 10 mEq by mouth every morning.   . rosuvastatin  (CRESTOR) 20 MG tablet Take 20 mg by mouth every evening.   . senna (SENOKOT) 8.6 MG tablet Take 1 tablet by mouth daily.  . sertraline (ZOLOFT) 50 MG tablet Take 50 mg by mouth daily.  Marland Kitchen tiotropium (SPIRIVA) 18 MCG inhalation capsule Place 18 mcg into inhaler and inhale daily.     Review of Systems  Constitutional: Negative.   HENT: Negative.   Eyes: Negative.   Respiratory: Negative.   Cardiovascular: Negative.   Gastrointestinal: Negative.   Endocrine: Negative.   Musculoskeletal: Negative.   Skin: Negative.   Allergic/Immunologic: Negative.   Neurological: Negative.        Mild left arm and hand weakness  Hematological: Negative.   Psychiatric/Behavioral: Negative.   All other systems reviewed and are negative.  BP 130/80  Pulse 75  Ht 6\' 1"  (1.854 m)  Wt 224 lb 8 oz (101.833 kg)  BMI 29.63 kg/m2  Physical Exam  Nursing note and vitals reviewed. Constitutional: He is oriented to person, place, and time. He appears well-developed and well-nourished.  HENT:  Head: Normocephalic.  Nose: Nose normal.  Mouth/Throat: Oropharynx is clear and moist.  Eyes: Conjunctivae are normal. Pupils are equal, round, and reactive to light.  Neck: Normal range of motion. Neck supple. No JVD present.  Cardiovascular: Normal rate, S1 normal, S2 normal and intact distal pulses.  An irregularly irregular rhythm present. Exam reveals no gallop and no friction rub.   Murmur heard.  Crescendo systolic murmur is present with a grade of 2/6  Trace pitting above the sock line bilaterally  Pulmonary/Chest: Effort normal. No respiratory distress. He has decreased breath sounds. He has no wheezes. He has no rales. He exhibits no tenderness.  Abdominal: Soft. Bowel sounds are normal. He exhibits no distension. There is no tenderness.  Musculoskeletal: Normal range of motion. He exhibits no edema and no tenderness.  Lymphadenopathy:    He has no cervical adenopathy.  Neurological: He is alert and  oriented to person, place, and time. Coordination normal.  Skin: Skin is warm and dry. No rash noted. No erythema.  Psychiatric: He has a normal mood and affect. His behavior is normal. Judgment and thought content normal.      Assessment and Plan

## 2013-12-23 NOTE — Assessment & Plan Note (Signed)
Recent 10 day course of antibiotics for bronchitis. Seen by Dr. Raul Del

## 2013-12-23 NOTE — Assessment & Plan Note (Addendum)
Attempt to wean his Florinef dosing to every other day was unsuccessful with recent syncope, orthostatic hypotension. Uncertain if recent bronchitis contributing to his symptoms.  He seems to do better on full dose Florinef daily. We will run his blood pressure high in the 902-409 systolic range.

## 2013-12-23 NOTE — Assessment & Plan Note (Addendum)
We have given him a new guide for his Lasix as below:  For weight >223, take lasix 40 mg twice a day (Am and lunch time)  For weight 219 to 222, please take lasix 40 mg a day  For weight 215 to 218 pounds, take lasix 20 mg a day  For weight <215 pounds, Take lasix 20 mg every other day

## 2013-12-23 NOTE — Assessment & Plan Note (Signed)
Currently with no symptoms of angina. No further workup at this time. Continue current medication regimen. 

## 2014-01-03 ENCOUNTER — Ambulatory Visit: Payer: Self-pay | Admitting: Specialist

## 2014-01-06 LAB — BASIC METABOLIC PANEL
Anion Gap: 4 — ABNORMAL LOW (ref 7–16)
BUN: 18 mg/dL (ref 7–18)
CALCIUM: 9.1 mg/dL (ref 8.5–10.1)
Chloride: 103 mmol/L (ref 98–107)
Co2: 34 mmol/L — ABNORMAL HIGH (ref 21–32)
Creatinine: 1.21 mg/dL (ref 0.60–1.30)
EGFR (Non-African Amer.): 58 — ABNORMAL LOW
Glucose: 80 mg/dL (ref 65–99)
OSMOLALITY: 282 (ref 275–301)
Potassium: 4.3 mmol/L (ref 3.5–5.1)
SODIUM: 141 mmol/L (ref 136–145)

## 2014-01-06 LAB — CBC WITH DIFFERENTIAL/PLATELET
BASOS PCT: 0.8 %
Basophil #: 0.1 10*3/uL (ref 0.0–0.1)
EOS ABS: 0.1 10*3/uL (ref 0.0–0.7)
Eosinophil %: 1.3 %
HCT: 36.7 % — AB (ref 40.0–52.0)
HGB: 12.2 g/dL — ABNORMAL LOW (ref 13.0–18.0)
Lymphocyte #: 1.4 10*3/uL (ref 1.0–3.6)
Lymphocyte %: 14.6 %
MCH: 30.6 pg (ref 26.0–34.0)
MCHC: 33.2 g/dL (ref 32.0–36.0)
MCV: 92 fL (ref 80–100)
Monocyte #: 0.7 x10 3/mm (ref 0.2–1.0)
Monocyte %: 6.8 %
Neutrophil #: 7.3 10*3/uL — ABNORMAL HIGH (ref 1.4–6.5)
Neutrophil %: 76.5 %
Platelet: 160 10*3/uL (ref 150–440)
RBC: 3.98 10*6/uL — ABNORMAL LOW (ref 4.40–5.90)
RDW: 14.5 % (ref 11.5–14.5)
WBC: 9.6 10*3/uL (ref 3.8–10.6)

## 2014-01-06 LAB — TROPONIN I

## 2014-01-07 ENCOUNTER — Observation Stay: Payer: Self-pay | Admitting: Internal Medicine

## 2014-01-07 DIAGNOSIS — I10 Essential (primary) hypertension: Secondary | ICD-10-CM

## 2014-01-07 DIAGNOSIS — I5032 Chronic diastolic (congestive) heart failure: Secondary | ICD-10-CM

## 2014-01-07 DIAGNOSIS — R55 Syncope and collapse: Secondary | ICD-10-CM

## 2014-01-07 DIAGNOSIS — I4891 Unspecified atrial fibrillation: Secondary | ICD-10-CM

## 2014-01-07 LAB — URINALYSIS, COMPLETE
BACTERIA: NONE SEEN
BLOOD: NEGATIVE
Bilirubin,UR: NEGATIVE
Glucose,UR: NEGATIVE mg/dL (ref 0–75)
Ketone: NEGATIVE
Leukocyte Esterase: NEGATIVE
Nitrite: NEGATIVE
Ph: 5 (ref 4.5–8.0)
Protein: NEGATIVE
Specific Gravity: 1.018 (ref 1.003–1.030)
Squamous Epithelial: NONE SEEN

## 2014-01-07 LAB — TROPONIN I
Troponin-I: <0.02 ng/mL
Troponin-I: <0.02 ng/mL

## 2014-01-07 LAB — CK-MB
CK-MB: <0.5 ng/mL — ABNORMAL LOW (ref 0.5–3.6)
CK-MB: 0.6 ng/mL (ref 0.5–3.6)

## 2014-01-09 ENCOUNTER — Telehealth: Payer: Self-pay | Admitting: Adult Health

## 2014-01-09 NOTE — Telephone Encounter (Signed)
Wife called concerning patient's blood pressure.  Stating recording of 132/100, and 141/98. Patient is on midodrine for orthostatic hypotension.  Home health requested that his BP be reported.  I will not make any changes in his medication regimen at this time. He is to see Dr. Rockey Situ on Tuesday Jan 11, 2014 for  Cardiac monitor placement. This will be reported to the nurses at that time as well.   They are advised to call if BP systolic is >035 or diastolic > 248 mmHg.

## 2014-01-11 ENCOUNTER — Telehealth: Payer: Self-pay | Admitting: *Deleted

## 2014-01-11 DIAGNOSIS — R55 Syncope and collapse: Secondary | ICD-10-CM

## 2014-01-11 NOTE — Telephone Encounter (Signed)
Pt released from [facility] on 5/22 w/ instructions to call the office for a monitor. Do you want him to have a 30 day?

## 2014-01-11 NOTE — Telephone Encounter (Signed)
Patient's wife is calling about [employer] heart monitor. Please call her.

## 2014-01-12 IMAGING — CR DG CHEST 1V PORT
1 series · 1 of 1 positions shown · non-contrast
Comparison: 05/23/2013

CLINICAL DATA: Chest pain on the right

EXAM:
PORTABLE CHEST - 1 VIEW

[ap]
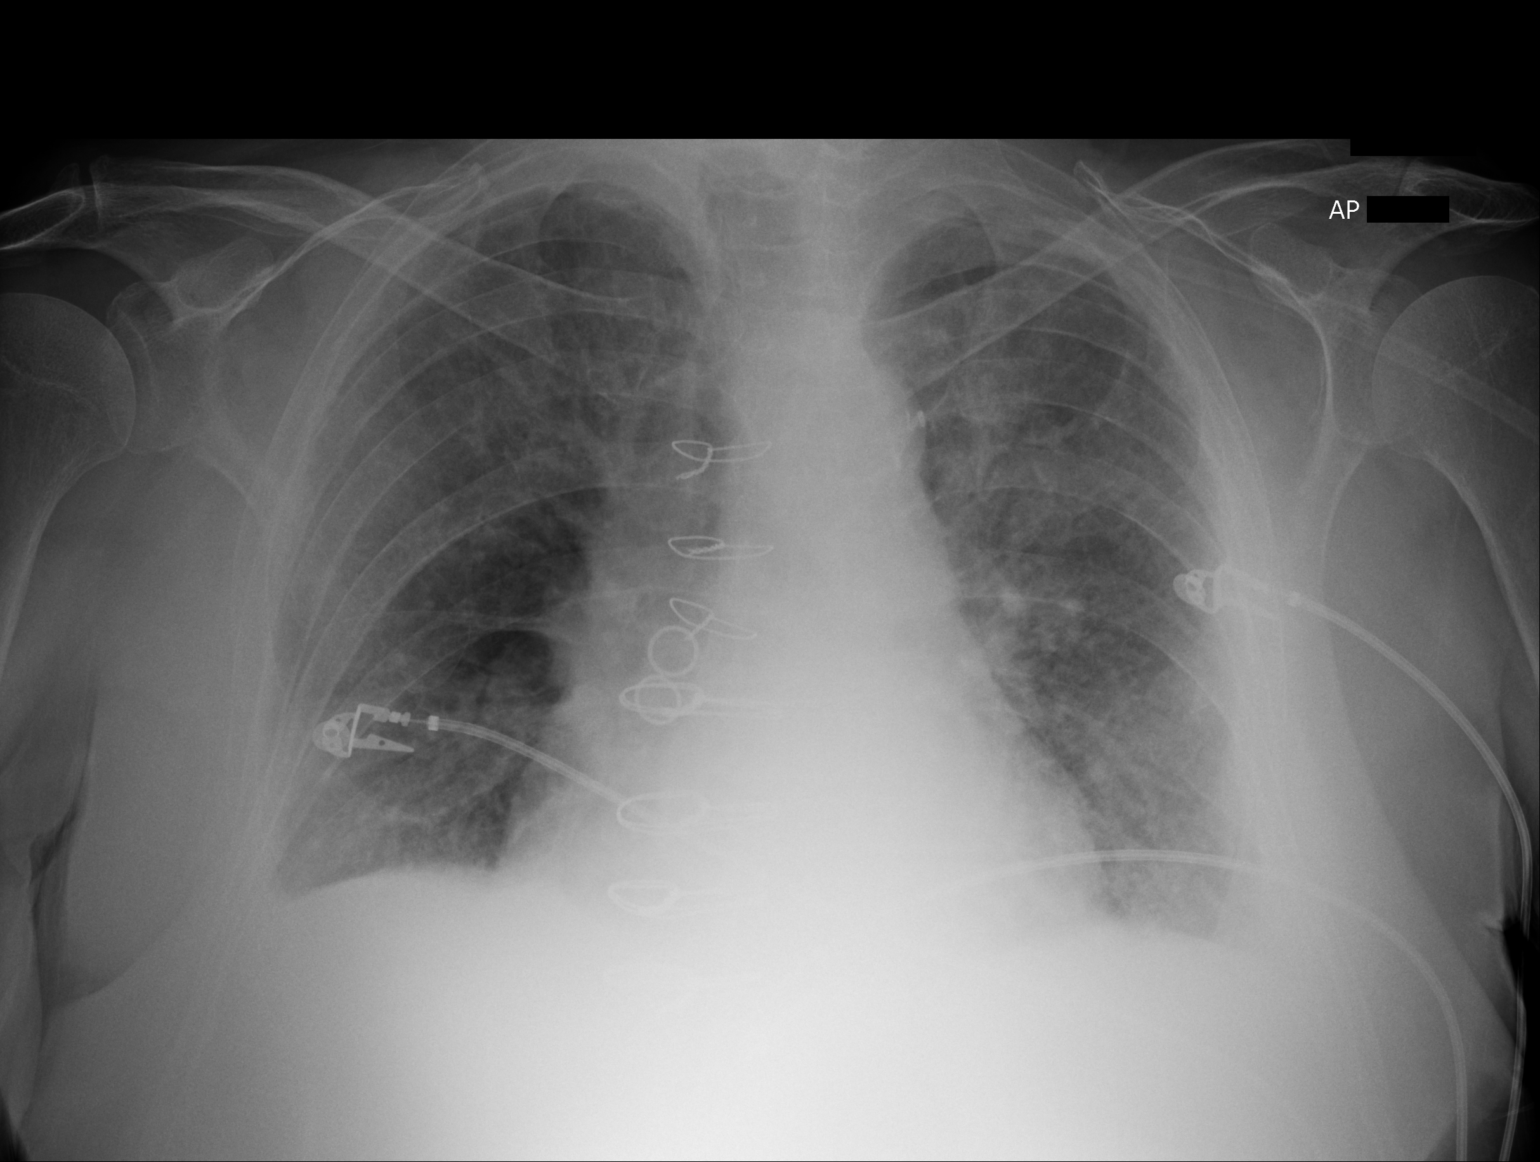

[1 of 1 positions shown; findings below may reference images not displayed]

FINDINGS: Postsurgical changes are again noted. The cardiac shadow is stable.
Mild increased vascular congestion is noted with some changes of
early edema on the left. No sizable effusion is seen. No
pneumothorax is noted.
IMPRESSION: Changes of early CHF.

## 2014-01-12 NOTE — Telephone Encounter (Signed)
Would continue to monitor blood pressure for now.  On the midodrine, blood pressure will run higher when he lays down, better when he sits up Would like to keep it running mildly elevated to avoid passout spells from low blood pressure

## 2014-01-12 NOTE — Telephone Encounter (Signed)
Left detailed message for pt to expect call from [facility] today.

## 2014-01-12 NOTE — Telephone Encounter (Signed)
Needs 30 day monitor syncope

## 2014-01-14 DIAGNOSIS — I4891 Unspecified atrial fibrillation: Secondary | ICD-10-CM

## 2014-01-14 DIAGNOSIS — R55 Syncope and collapse: Secondary | ICD-10-CM

## 2014-01-19 ENCOUNTER — Other Ambulatory Visit: Payer: Self-pay | Admitting: Cardiovascular Disease

## 2014-01-21 ENCOUNTER — Ambulatory Visit (INDEPENDENT_AMBULATORY_CARE_PROVIDER_SITE_OTHER): Payer: Medicare Other | Admitting: Cardiovascular Disease

## 2014-01-21 ENCOUNTER — Encounter: Payer: Self-pay | Admitting: Cardiovascular Disease

## 2014-01-21 VITALS — BP 110/80 | HR 83 | Ht 73.0 in | Wt 220.5 lb

## 2014-01-21 DIAGNOSIS — J4489 Other specified chronic obstructive pulmonary disease: Secondary | ICD-10-CM

## 2014-01-21 DIAGNOSIS — I25810 Atherosclerosis of coronary artery bypass graft(s) without angina pectoris: Secondary | ICD-10-CM

## 2014-01-21 DIAGNOSIS — J449 Chronic obstructive pulmonary disease, unspecified: Secondary | ICD-10-CM

## 2014-01-21 DIAGNOSIS — I5032 Chronic diastolic (congestive) heart failure: Secondary | ICD-10-CM

## 2014-01-21 DIAGNOSIS — I951 Orthostatic hypotension: Secondary | ICD-10-CM

## 2014-01-21 DIAGNOSIS — R55 Syncope and collapse: Secondary | ICD-10-CM

## 2014-01-21 DIAGNOSIS — I4891 Unspecified atrial fibrillation: Secondary | ICD-10-CM

## 2014-01-21 DIAGNOSIS — I6529 Occlusion and stenosis of unspecified carotid artery: Secondary | ICD-10-CM

## 2014-01-21 DIAGNOSIS — I509 Heart failure, unspecified: Secondary | ICD-10-CM

## 2014-01-21 NOTE — Progress Notes (Signed)
Patient ID: Javier Perfect., male    DOB: 09-29-1935, 78 y.o.   MRN: [id number]  HPI Comments: 78 year old male with history of smoking 40 years, COPD, chronic atrial fibrillation, coronary artery disease with bypass surgery in 2007, occlusion of 2 vein grafts with PCI x4, peripheral vascular disease with bilateral carotid endarterectomies in 2002, history of TIA who has presented several times to [facility] with COPD exacerbation,  TIA, frequent episodse of COPD exacerbation/bronchitis often needing long courses of antibiotics and steroids , most recent cardiac catheterization at [facility] showing patent vessels with no significant progression of his disease, who presents for routine followup . He was previously found to be resistant to Plavix by P2Y12 assay  at [facility] Numerous episodes of syncope initially felt to be secondary to orthostasis, started on fludrocortisone with mild improvement of his symptoms if continued syncopal episodes.   syncope episode  while he was seen Dr. Raul Del, pulmonary. He was sitting in the chair, feeling fine, had acute onset of syncope. Blood pressure was 88 systolic.  blood pressure improved up to 160 systolic in a supine position. Prior to that event, Florinef dosing had been decreased down to every other day. He is also taking his Lasix in an aggressive manner to maintain his weight to avoid diastolic CHF episodes. Dr. Raul Del did give him 10 days of antibiotics for possible bronchitis.   Repeat episode of syncope 2 weeks ago while walking from the bathroom to the bedroom. He denies having any warning, no lightheadedness or dizziness, only found himself on the ground. This is on anticoagulation and it has had, family took him to the hospital where he was kept for observation. He was discharged with instructions for a 30 day monitor. He presents today with the monitor in place. Denies any near syncope or syncope since discharge. Blood pressure is typically been well controlled, 737  systolic up to 106Y. Weight has been stable He only takes Lasix when necessary for significant leg edema  hospitalization for TIA followed by discovery of large abdominal aortic aneurysm. Estimated at more than 5 cm He was kept in the hospital and had endograft placement for history related by Dr. Ronalee Belts. He did well through the procedure.  Echocardiogram done the day of his admission showed atrial fibrillation, ejection fraction 50-55%, mild to moderate MR and TR, moderately elevated right ventricular systolic pressures   recent TIA. On October 5, he woke in the middle of the night with left arm and left leg weakness. He called 911 and was initially evaluated at [facility], and transferred to [facility]. CT scan of the head, MRI did not show an acute stroke. EEG by his report was negative. He was discharged home on warfarin at the same dose. It was noted that INR was 1.79 which was below his typical baseline in the 2 range. He was discharged on 05/25/2013.  he had a full recovery.  Repeat TIA while in our office on 06/02/2013 during his INR check. He was sent to [facility] with Code [place] protocol, Released after CT and head MRI.  In follow up today, he reports he is almost back to his baseline. His left arm/hand still has mild residual weakness.    hospitalization in May 2013 chest pain, COPD exacerbation. Stress test showed no ischemia.  Echocardiogram was essentially normal with mild MR. received significant IV fluids through his hospital course.  Edema got worse through the next week with significant pain in his legs. This improved on outpatient diuretics.  Lasix held periodically for low blood pressure Found to be very iron deficient, very anemic. Labs show iron level XVI, hematocrit 24 BNP 400  admission to the hospital 08/31/2012 for shortness of breath. Treated for COPD exacerbation, started on Levaquin IV steroids. CT scan of the chest showing stable pulmonary nodule.   Several recent hospital  admissions for orthostatic hypotension, syncope. Previous admission 02/06/2013  hospitalized end of July 2014 with discharge July 31 with syncope, orthostatic hypotension. Creatinine was 1.54 with BUN 30 on arrival.  Lasix has been held.   seen by hematology, Dr. Cynda Acres, suggested that he have additional testing for platelet aggregation.  He was previously not on warfarin secondary to recent GI bleeds, as well as hematuria. Stroke August 2014 while not on warfarin. He had acute onset mental status changes with garbled speech. He was given TPA on route to [facility], additional workup included carotid ultrasound, head CT scan and MRA. He was discharged on aspirin. He reports near complete resolution of his symptoms.   hospital for atypical chest pain. Admitted on 06/24/2013. He has stress test on 06/25/2013 that showed moderate ischemia in the lateral and inferolateral wall. He underwent cardiac catheterization 06/28/2013 showing occluded saphenous vein graft to the diagonal. Abnormal stress test likely from ischemia in the [place] distribution which was not bypassed. No intervention was performed. In followup today, he feels well with no complaints. Breathing is good, weight has been increasing over the past several weeks, likely exacerbated from IV fluids in the hospital. Recent INR 2.5 today. No further TIA or stroke-type symptoms   hospitalization March 16 with discharge 11/02/2013 with acute on chronic diastolic CHF. Treated with IV Lasix with improvement of his symptoms. Since discharge weight has ranged from 212 pounds up to 220 pounds, 218 pounds at home today. Denies having any significant shortness of breath or leg edema. Continues to feel weak, slowly getting his strength back Recent hematocrit 11/12/2011 showing hemoglobin 12, hematocrit 37  Prior Carotid ultrasound done in the hospital 01/12/2012 shows no significant carotid disease , repeat study August 2014 EKG shows atrial fibrillation with  ventricular rate 83beats per minute,  nonspecific ST abnormality in the inferior leads, and anterolateral leads   Outpatient Encounter Prescriptions as of 01/21/2014  Medication Sig  . albuterol (VENTOLIN HFA) 108 (90 BASE) MCG/ACT inhaler Inhale 2 puffs into the lungs every 6 (six) hours as needed for wheezing.  Marland Kitchen ALPRAZolam (XANAX) 0.25 MG tablet Take 0.5 mg by mouth every 6 (six) hours as needed for anxiety.   Marland Kitchen apixaban (ELIQUIS) 5 MG TABS tablet Take 5 mg by mouth 2 (two) times daily.  . Ascorbic Acid (VITAMIN C) 1000 MG tablet Take 1,000 mg by mouth daily.  Marland Kitchen aspirin EC 81 MG tablet Take 81 mg by mouth daily.  . bisoprolol (ZEBETA) 5 MG tablet Take 5 mg by mouth 2 (two) times daily.  . budesonide-formoterol (SYMBICORT) 160-4.5 MCG/ACT inhaler Inhale 2 puffs into the lungs 2 (two) times daily.   Marland Kitchen docusate sodium (COLACE) 100 MG capsule Take 100 mg by mouth 2 (two) times daily.  . Ferrous Sulfate (IRON) 325 (65 FE) MG TABS Take 325 mg by mouth 2 (two) times daily.   . fludrocortisone (FLORINEF) 0.1 MG tablet TAKE 1 TABLET BY MOUTH DAILY  . furosemide (LASIX) 40 MG tablet Take 20 mg by mouth daily.  Marland Kitchen ipratropium-albuterol (DUONEB) 0.5-2.5 (3) MG/3ML SOLN Take 3 mLs by nebulization every 6 (six) hours as needed (for shortness of breath).   Marland Kitchen  leuprolide, 6 Month, (ELIGARD) 45 MG injection Inject 45 mg into the skin every 6 (six) months.  . midodrine (PROAMATINE) 5 MG tablet Take 1 tablet (5 mg total) by mouth 3 (three) times daily as needed (for low blood pressure).  . montelukast (SINGULAIR) 10 MG tablet Take 10 mg by mouth at bedtime.   . nitroGLYCERIN (NITROSTAT) 0.4 MG SL tablet Place 1 tablet (0.4 mg total) under the tongue every 5 (five) minutes as needed for chest pain.  Marland Kitchen omega-3 acid ethyl esters (LOVAZA) 1 G capsule Take 1 g by mouth 2 (two) times daily.   Marland Kitchen oxyCODONE-acetaminophen (PERCOCET/ROXICET) 5-325 MG per tablet Take 1 tablet by mouth every 6 (six) hours as needed.   .  pantoprazole (PROTONIX) 40 MG tablet Take 40 mg by mouth 3 (three) times daily.   . potassium chloride (K-DUR) 10 MEQ tablet Take 10 mEq by mouth every morning.   . rosuvastatin (CRESTOR) 20 MG tablet Take 20 mg by mouth every evening.   . senna (SENOKOT) 8.6 MG tablet Take 1 tablet by mouth daily.  . sertraline (ZOLOFT) 50 MG tablet Take 50 mg by mouth daily.  . sucralfate (CARAFATE) 1 G tablet Take 1 g by mouth 4 (four) times daily.   Marland Kitchen tiotropium (SPIRIVA) 18 MCG inhalation capsule Place 18 mcg into inhaler and inhale daily.    Review of Systems  Constitutional: Negative.   HENT: Negative.   Eyes: Negative.   Respiratory: Negative.   Cardiovascular: Negative.   Gastrointestinal: Negative.   Endocrine: Negative.   Musculoskeletal: Negative.   Skin: Negative.   Allergic/Immunologic: Negative.   Neurological: Negative.        Mild left arm and hand weakness  Hematological: Negative.   Psychiatric/Behavioral: Negative.   All other systems reviewed and are negative.  BP 110/80  Pulse 83  Ht 6\' 1"  (1.854 m)  Wt 220 lb 8 oz (100.018 kg)  BMI 29.10 kg/m2  Physical Exam  Nursing note and vitals reviewed. Constitutional: He is oriented to person, place, and time. He appears well-developed and well-nourished.  HENT:  Head: Normocephalic.  Nose: Nose normal.  Mouth/Throat: Oropharynx is clear and moist.  Eyes: Conjunctivae are normal. Pupils are equal, round, and reactive to light.  Neck: Normal range of motion. Neck supple. No JVD present.  Cardiovascular: Normal rate, S1 normal, S2 normal and intact distal pulses.  An irregularly irregular rhythm present. Exam reveals no gallop and no friction rub.   Murmur heard.  Crescendo systolic murmur is present with a grade of 2/6  Trace pitting above the sock line bilaterally  Pulmonary/Chest: Effort normal. No respiratory distress. He has decreased breath sounds. He has no wheezes. He has no rales. He exhibits no tenderness.  Abdominal:  Soft. Bowel sounds are normal. He exhibits no distension. There is no tenderness.  Musculoskeletal: Normal range of motion. He exhibits no edema and no tenderness.  Lymphadenopathy:    He has no cervical adenopathy.  Neurological: He is alert and oriented to person, place, and time. Coordination normal.  Skin: Skin is warm and dry. No rash noted. No erythema.  Psychiatric: He has a normal mood and affect. His behavior is normal. Judgment and thought content normal.      Assessment and Plan

## 2014-01-21 NOTE — Assessment & Plan Note (Signed)
Chronic atrial fibrillation, rate relatively well controlled. On anticoagulation. Prior history of bleeding, doing well on eliquis 5 mg twice a day.

## 2014-01-21 NOTE — Assessment & Plan Note (Signed)
Recurrent episodes of syncope. Most recent episode while walking at home. Episode seemed to be increasing in frequency, now with rest. 30 day monitor in place. If no syncopal episode while monitor in place, may need a reveal device for further monitoring. Will refer to EP.

## 2014-01-21 NOTE — Assessment & Plan Note (Signed)
Recent testing. No hemodynamically significant stenoses.

## 2014-01-21 NOTE — Patient Instructions (Signed)
You are doing well. No medication changes were made.  We will set up an appt with Dr. Caryl Comes very end of June for syncope  Please call us if you have new issues that need to be addressed before your next appt.  Your physician wants you to follow-up in: 3 months.

## 2014-01-21 NOTE — Assessment & Plan Note (Signed)
COPD has been relatively stable. No recent exacerbations

## 2014-01-21 NOTE — Assessment & Plan Note (Signed)
Currently with no symptoms of angina. No further workup at this time. Continue current medication regimen. 

## 2014-01-21 NOTE — Assessment & Plan Note (Signed)
Blood pressure running mildly high though given prior history of orthostatic hypotension, we'll continue him on his current dose of Florinef. Is not using midodrine. Florinef does not seem to be exacerbating his heart failure at this time.

## 2014-01-21 NOTE — Assessment & Plan Note (Signed)
Very brittle heart failure, worsening orthostasis for overdiuresis.

## 2014-01-26 ENCOUNTER — Telehealth: Payer: Self-pay | Admitting: *Deleted

## 2014-01-26 ENCOUNTER — Emergency Department: Payer: Self-pay | Admitting: Emergency Medicine

## 2014-01-26 LAB — URINALYSIS, COMPLETE
Bacteria: NONE SEEN
Bilirubin,UR: NEGATIVE
Blood: NEGATIVE
Glucose,UR: NEGATIVE mg/dL (ref 0–75)
Ketone: NEGATIVE
Leukocyte Esterase: NEGATIVE
Nitrite: NEGATIVE
PH: 6 (ref 4.5–8.0)
PROTEIN: NEGATIVE
RBC,UR: 1 /HPF (ref 0–5)
SQUAMOUS EPITHELIAL: NONE SEEN
Specific Gravity: 1.012 (ref 1.003–1.030)

## 2014-01-26 LAB — CBC WITH DIFFERENTIAL/PLATELET
BASOS ABS: 0.1 10*3/uL (ref 0.0–0.1)
Basophil %: 0.6 %
EOS PCT: 2.2 %
Eosinophil #: 0.2 10*3/uL (ref 0.0–0.7)
HCT: 38.1 % — AB (ref 40.0–52.0)
HGB: 12.3 g/dL — AB (ref 13.0–18.0)
Lymphocyte #: 1.4 10*3/uL (ref 1.0–3.6)
Lymphocyte %: 15.7 %
MCH: 30 pg (ref 26.0–34.0)
MCHC: 32.1 g/dL (ref 32.0–36.0)
MCV: 94 fL (ref 80–100)
MONO ABS: 0.6 x10 3/mm (ref 0.2–1.0)
Monocyte %: 6.6 %
Neutrophil #: 6.5 10*3/uL (ref 1.4–6.5)
Neutrophil %: 74.9 %
Platelet: 166 10*3/uL (ref 150–440)
RBC: 4.08 10*6/uL — ABNORMAL LOW (ref 4.40–5.90)
RDW: 14 % (ref 11.5–14.5)
WBC: 8.7 10*3/uL (ref 3.8–10.6)

## 2014-01-26 LAB — TROPONIN I: Troponin-I: <0.02 ng/mL

## 2014-01-26 LAB — BASIC METABOLIC PANEL
ANION GAP: 6 — AB (ref 7–16)
BUN: 15 mg/dL (ref 7–18)
Calcium, Total: 9.4 mg/dL (ref 8.5–10.1)
Chloride: 102 mmol/L (ref 98–107)
Co2: 33 mmol/L — ABNORMAL HIGH (ref 21–32)
Creatinine: 1.28 mg/dL (ref 0.60–1.30)
EGFR (African American): >60
GFR CALC NON AF AMER: 54 — AB
GLUCOSE: 79 mg/dL (ref 65–99)
Osmolality: 281 (ref 275–301)
POTASSIUM: 4.8 mmol/L (ref 3.5–5.1)
Sodium: 141 mmol/L (ref 136–145)

## 2014-01-26 NOTE — Telephone Encounter (Signed)
LVM 6/10

## 2014-01-26 NOTE — Telephone Encounter (Signed)
Patient feeling very tired for two weeks now. Not sure if its chf please call.

## 2014-01-27 ENCOUNTER — Telehealth: Payer: Self-pay

## 2014-01-27 ENCOUNTER — Telehealth: Payer: Self-pay | Admitting: *Deleted

## 2014-01-27 NOTE — Telephone Encounter (Signed)
Nurse From [facility] called, states pt had another syncopal episode yesterday, states he "was completely out" was disoriented.States he is currently wearing a monitor. Also states on June 9 he was seen by PCP and drew a BNP and his results was 2059. States he has had a lot of lethargy lately. Please call.

## 2014-01-27 NOTE — Telephone Encounter (Signed)
Holter results reviewed by Ignacia Bayley NP  He stated patient needs to continue to wear the monitor and report syncope episodes  Try to avoid any known factors that might bring it on  Keep appt with Dr. [facility] if he/family feels the situation might be emergent   Patient and family verbalized understanding

## 2014-01-27 NOTE — Telephone Encounter (Signed)
Can we get strips from monitoring company?

## 2014-01-27 NOTE — Telephone Encounter (Signed)
Lynn with [facility] called and patient's orders are coming to an end. Needs verbal orders to recertifie Ok to lmom on cell phone make sure you leave your name.

## 2014-01-31 ENCOUNTER — Other Ambulatory Visit: Payer: Self-pay | Admitting: Cardiovascular Disease

## 2014-01-31 NOTE — Telephone Encounter (Signed)
Spoke w/ Javier [name]. She reports another syncopal episode last week, EMTs were called, pt has no memory of episode.  Gave verbal order to continue [facility] services.

## 2014-02-13 ENCOUNTER — Observation Stay: Payer: Self-pay | Admitting: Internal Medicine

## 2014-02-13 DIAGNOSIS — R079 Chest pain, unspecified: Secondary | ICD-10-CM

## 2014-02-13 DIAGNOSIS — R55 Syncope and collapse: Secondary | ICD-10-CM

## 2014-02-13 DIAGNOSIS — I5032 Chronic diastolic (congestive) heart failure: Secondary | ICD-10-CM

## 2014-02-13 DIAGNOSIS — I4891 Unspecified atrial fibrillation: Secondary | ICD-10-CM

## 2014-02-13 LAB — APTT: ACTIVATED PTT: 39 s — AB (ref 23.6–35.9)

## 2014-02-13 LAB — CK TOTAL AND CKMB (NOT AT ARMC)
CK, Total: 33 U/L — ABNORMAL LOW
CK, Total: 16 U/L — ABNORMAL LOW
CK, Total: 13 U/L — ABNORMAL LOW
CK-MB: 0.7 ng/mL (ref 0.5–3.6)
CK-MB: 0.7 ng/mL (ref 0.5–3.6)
CK-MB: 1 ng/mL (ref 0.5–3.6)

## 2014-02-13 LAB — CBC
HCT: 32.7 % — AB (ref 40.0–52.0)
HGB: 11.1 g/dL — AB (ref 13.0–18.0)
MCH: 30.8 pg (ref 26.0–34.0)
MCHC: 34 g/dL (ref 32.0–36.0)
MCV: 91 fL (ref 80–100)
PLATELETS: 144 10*3/uL — AB (ref 150–440)
RBC: 3.61 10*6/uL — ABNORMAL LOW (ref 4.40–5.90)
RDW: 14.1 % (ref 11.5–14.5)
WBC: 8.8 10*3/uL (ref 3.8–10.6)

## 2014-02-13 LAB — TROPONIN I
Troponin-I: <0.02 ng/mL
Troponin-I: <0.02 ng/mL

## 2014-02-13 LAB — BASIC METABOLIC PANEL
Anion Gap: 7 (ref 7–16)
BUN: 22 mg/dL — ABNORMAL HIGH (ref 7–18)
CALCIUM: 8.7 mg/dL (ref 8.5–10.1)
CREATININE: 1.47 mg/dL — AB (ref 0.60–1.30)
Chloride: 107 mmol/L (ref 98–107)
Co2: 29 mmol/L (ref 21–32)
EGFR (African American): 53 — ABNORMAL LOW
EGFR (Non-African Amer.): 46 — ABNORMAL LOW
GLUCOSE: 89 mg/dL (ref 65–99)
OSMOLALITY: 288 (ref 275–301)
Potassium: 3.7 mmol/L (ref 3.5–5.1)
Sodium: 143 mmol/L (ref 136–145)

## 2014-02-13 LAB — PROTIME-INR
INR: 1.4
Prothrombin Time: 16.7 secs — ABNORMAL HIGH (ref 11.5–14.7)

## 2014-02-13 LAB — HEPARIN LEVEL (UNFRACTIONATED)

## 2014-02-13 LAB — PRO B NATRIURETIC PEPTIDE: B-Type Natriuretic Peptide: 4793 pg/mL — ABNORMAL HIGH (ref 0–450)

## 2014-02-14 DIAGNOSIS — I4891 Unspecified atrial fibrillation: Secondary | ICD-10-CM

## 2014-02-14 DIAGNOSIS — R55 Syncope and collapse: Secondary | ICD-10-CM

## 2014-02-14 DIAGNOSIS — R079 Chest pain, unspecified: Secondary | ICD-10-CM

## 2014-02-14 DIAGNOSIS — I5032 Chronic diastolic (congestive) heart failure: Secondary | ICD-10-CM

## 2014-02-14 LAB — BASIC METABOLIC PANEL
ANION GAP: 7 (ref 7–16)
BUN: 23 mg/dL — ABNORMAL HIGH (ref 7–18)
CO2: 29 mmol/L (ref 21–32)
Calcium, Total: 8.7 mg/dL (ref 8.5–10.1)
Chloride: 106 mmol/L (ref 98–107)
Creatinine: 1.46 mg/dL — ABNORMAL HIGH (ref 0.60–1.30)
GFR CALC AF AMER: 54 — AB
GFR CALC NON AF AMER: 46 — AB
Glucose: 93 mg/dL (ref 65–99)
OSMOLALITY: 287 (ref 275–301)
Potassium: 3.7 mmol/L (ref 3.5–5.1)
Sodium: 142 mmol/L (ref 136–145)

## 2014-02-14 LAB — LIPID PANEL
Cholesterol: 145 mg/dL (ref 0–200)
HDL: 29 mg/dL — AB (ref 40–60)
LDL CHOLESTEROL, CALC: 68 mg/dL (ref 0–100)
TRIGLYCERIDES: 240 mg/dL — AB (ref 0–200)
VLDL Cholesterol, Calc: 48 mg/dL — ABNORMAL HIGH (ref 5–40)

## 2014-02-14 LAB — CBC WITH DIFFERENTIAL/PLATELET
BASOS PCT: 0.4 %
Basophil #: 0 10*3/uL (ref 0.0–0.1)
Eosinophil #: 0.2 10*3/uL (ref 0.0–0.7)
Eosinophil %: 2.2 %
HCT: 34.2 % — ABNORMAL LOW (ref 40.0–52.0)
HGB: 11.2 g/dL — AB (ref 13.0–18.0)
Lymphocyte #: 1.4 10*3/uL (ref 1.0–3.6)
Lymphocyte %: 16.3 %
MCH: 29.8 pg (ref 26.0–34.0)
MCHC: 32.7 g/dL (ref 32.0–36.0)
MCV: 91 fL (ref 80–100)
MONOS PCT: 5.2 %
Monocyte #: 0.4 x10 3/mm (ref 0.2–1.0)
Neutrophil #: 6.5 10*3/uL (ref 1.4–6.5)
Neutrophil %: 75.9 %
Platelet: 139 10*3/uL — ABNORMAL LOW (ref 150–440)
RBC: 3.75 10*6/uL — AB (ref 4.40–5.90)
RDW: 14 % (ref 11.5–14.5)
WBC: 8.5 10*3/uL (ref 3.8–10.6)

## 2014-02-15 ENCOUNTER — Institutional Professional Consult (permissible substitution): Payer: Medicare Other | Admitting: Internal Medicine

## 2014-02-15 ENCOUNTER — Ambulatory Visit: Payer: Medicare Other | Admitting: Internal Medicine

## 2014-02-16 NOTE — Telephone Encounter (Signed)
This encounter was created in error - please disregard.

## 2014-02-22 ENCOUNTER — Ambulatory Visit: Payer: Medicare Other | Admitting: Internal Medicine

## 2014-02-22 ENCOUNTER — Inpatient Hospital Stay: Payer: Self-pay | Admitting: Family Medicine

## 2014-02-22 ENCOUNTER — Telehealth: Payer: Self-pay

## 2014-02-22 DIAGNOSIS — I10 Essential (primary) hypertension: Secondary | ICD-10-CM

## 2014-02-22 DIAGNOSIS — E785 Hyperlipidemia, unspecified: Secondary | ICD-10-CM

## 2014-02-22 DIAGNOSIS — J441 Chronic obstructive pulmonary disease with (acute) exacerbation: Secondary | ICD-10-CM

## 2014-02-22 DIAGNOSIS — I4891 Unspecified atrial fibrillation: Secondary | ICD-10-CM

## 2014-02-22 DIAGNOSIS — I951 Orthostatic hypotension: Secondary | ICD-10-CM

## 2014-02-22 DIAGNOSIS — R0602 Shortness of breath: Secondary | ICD-10-CM

## 2014-02-22 LAB — CBC
HCT: 37.1 % — ABNORMAL LOW (ref 40.0–52.0)
HGB: 11.9 g/dL — ABNORMAL LOW (ref 13.0–18.0)
MCH: 29.4 pg (ref 26.0–34.0)
MCHC: 32 g/dL (ref 32.0–36.0)
MCV: 92 fL (ref 80–100)
Platelet: 162 10*3/uL (ref 150–440)
RBC: 4.03 10*6/uL — AB (ref 4.40–5.90)
RDW: 13.9 % (ref 11.5–14.5)
WBC: 8.3 10*3/uL (ref 3.8–10.6)

## 2014-02-22 LAB — BASIC METABOLIC PANEL
ANION GAP: 6 — AB (ref 7–16)
BUN: 19 mg/dL — ABNORMAL HIGH (ref 7–18)
CALCIUM: 8.9 mg/dL (ref 8.5–10.1)
CO2: 28 mmol/L (ref 21–32)
Chloride: 104 mmol/L (ref 98–107)
Creatinine: 1.26 mg/dL (ref 0.60–1.30)
EGFR (African American): >60
GFR CALC NON AF AMER: 55 — AB
GLUCOSE: 85 mg/dL (ref 65–99)
OSMOLALITY: 277 (ref 275–301)
Potassium: 3.8 mmol/L (ref 3.5–5.1)
Sodium: 138 mmol/L (ref 136–145)

## 2014-02-22 LAB — CK TOTAL AND CKMB (NOT AT ARMC)
CK, Total: 24 U/L — ABNORMAL LOW
CK-MB: 0.7 ng/mL (ref 0.5–3.6)

## 2014-02-22 LAB — TROPONIN I
Troponin-I: <0.02 ng/mL
Troponin-I: <0.02 ng/mL

## 2014-02-22 LAB — PRO B NATRIURETIC PEPTIDE: B-Type Natriuretic Peptide: 6254 pg/mL — ABNORMAL HIGH (ref 0–450)

## 2014-02-22 NOTE — Telephone Encounter (Signed)
Pt wife called, cancelled appt with Dr. Caryl Comes today,  states pt is on his way to the hospital, states he "woke up with pain". She asks if Dr. Caryl Comes could come see pt in hospital, I informed her Dr. Rockey Situ was on call, and the hospital would have to order a consult. She understood. Will call back to r/s appt with Dr. Caryl Comes at a later date.

## 2014-02-23 ENCOUNTER — Other Ambulatory Visit: Payer: Self-pay

## 2014-02-23 ENCOUNTER — Encounter (INDEPENDENT_AMBULATORY_CARE_PROVIDER_SITE_OTHER): Payer: Medicare Other

## 2014-02-23 DIAGNOSIS — I5033 Acute on chronic diastolic (congestive) heart failure: Secondary | ICD-10-CM

## 2014-02-23 DIAGNOSIS — R55 Syncope and collapse: Secondary | ICD-10-CM

## 2014-02-23 LAB — BASIC METABOLIC PANEL
Anion Gap: 8 (ref 7–16)
BUN: 17 mg/dL (ref 7–18)
CO2: 35 mmol/L — AB (ref 21–32)
CREATININE: 1.37 mg/dL — AB (ref 0.60–1.30)
Calcium, Total: 9.3 mg/dL (ref 8.5–10.1)
Chloride: 96 mmol/L — ABNORMAL LOW (ref 98–107)
EGFR (Non-African Amer.): 50 — ABNORMAL LOW
GFR CALC AF AMER: 58 — AB
Glucose: 95 mg/dL (ref 65–99)
Osmolality: 279 (ref 275–301)
Potassium: 3.3 mmol/L — ABNORMAL LOW (ref 3.5–5.1)
Sodium: 139 mmol/L (ref 136–145)

## 2014-02-23 LAB — MAGNESIUM: Magnesium: 1.7 mg/dL — ABNORMAL LOW

## 2014-02-24 ENCOUNTER — Ambulatory Visit: Payer: Medicare Other | Admitting: Cardiovascular Disease

## 2014-02-24 DIAGNOSIS — J9610 Chronic respiratory failure, unspecified whether with hypoxia or hypercapnia: Secondary | ICD-10-CM

## 2014-02-24 LAB — BASIC METABOLIC PANEL
Anion Gap: 9 (ref 7–16)
BUN: 27 mg/dL — ABNORMAL HIGH (ref 7–18)
CALCIUM: 9 mg/dL (ref 8.5–10.1)
CREATININE: 1.49 mg/dL — AB (ref 0.60–1.30)
Chloride: 95 mmol/L — ABNORMAL LOW (ref 98–107)
Co2: 34 mmol/L — ABNORMAL HIGH (ref 21–32)
EGFR (African American): 52 — ABNORMAL LOW
EGFR (Non-African Amer.): 45 — ABNORMAL LOW
GLUCOSE: 105 mg/dL — AB (ref 65–99)
Osmolality: 281 (ref 275–301)
POTASSIUM: 3.9 mmol/L (ref 3.5–5.1)
Sodium: 138 mmol/L (ref 136–145)

## 2014-02-24 LAB — CBC WITH DIFFERENTIAL/PLATELET
Basophil #: 0.1 10*3/uL (ref 0.0–0.1)
Basophil %: 0.5 %
EOS PCT: 1.9 %
Eosinophil #: 0.2 10*3/uL (ref 0.0–0.7)
HCT: 34.5 % — ABNORMAL LOW (ref 40.0–52.0)
HGB: 11.6 g/dL — ABNORMAL LOW (ref 13.0–18.0)
LYMPHS ABS: 2.1 10*3/uL (ref 1.0–3.6)
Lymphocyte %: 20.3 %
MCH: 31 pg (ref 26.0–34.0)
MCHC: 33.6 g/dL (ref 32.0–36.0)
MCV: 92 fL (ref 80–100)
MONO ABS: 0.7 x10 3/mm (ref 0.2–1.0)
MONOS PCT: 6.8 %
NEUTROS ABS: 7.1 10*3/uL — AB (ref 1.4–6.5)
Neutrophil %: 70.5 %
Platelet: 176 10*3/uL (ref 150–440)
RBC: 3.73 10*6/uL — AB (ref 4.40–5.90)
RDW: 13.9 % (ref 11.5–14.5)
WBC: 10.1 10*3/uL (ref 3.8–10.6)

## 2014-02-25 ENCOUNTER — Telehealth: Payer: Self-pay

## 2014-02-25 DIAGNOSIS — J9610 Chronic respiratory failure, unspecified whether with hypoxia or hypercapnia: Secondary | ICD-10-CM

## 2014-02-25 DIAGNOSIS — I5033 Acute on chronic diastolic (congestive) heart failure: Secondary | ICD-10-CM

## 2014-02-25 LAB — BASIC METABOLIC PANEL
Anion Gap: 4 — ABNORMAL LOW (ref 7–16)
BUN: 28 mg/dL — ABNORMAL HIGH (ref 7–18)
CO2: 38 mmol/L — AB (ref 21–32)
Calcium, Total: 9.1 mg/dL (ref 8.5–10.1)
Chloride: 97 mmol/L — ABNORMAL LOW (ref 98–107)
Creatinine: 1.58 mg/dL — ABNORMAL HIGH (ref 0.60–1.30)
EGFR (African American): 49 — ABNORMAL LOW
GFR CALC NON AF AMER: 42 — AB
Glucose: 97 mg/dL (ref 65–99)
OSMOLALITY: 283 (ref 275–301)
Potassium: 4.4 mmol/L (ref 3.5–5.1)
Sodium: 139 mmol/L (ref 136–145)

## 2014-02-25 NOTE — Telephone Encounter (Signed)
Attempted to contact pt regarding discharge from [facility] on 02/25/14. Left detailed message, as pt is sched for TCM appt on Mon 02/28/14 @ 11:15.

## 2014-02-28 ENCOUNTER — Encounter: Payer: Self-pay | Admitting: *Deleted

## 2014-02-28 ENCOUNTER — Ambulatory Visit: Payer: Medicare Other | Admitting: Cardiovascular Disease

## 2014-03-06 ENCOUNTER — Inpatient Hospital Stay: Payer: Self-pay | Admitting: Family Medicine

## 2014-03-06 LAB — CBC
HCT: 34.2 % — ABNORMAL LOW (ref 40.0–52.0)
HGB: 11.5 g/dL — AB (ref 13.0–18.0)
MCH: 30.8 pg (ref 26.0–34.0)
MCHC: 33.7 g/dL (ref 32.0–36.0)
MCV: 91 fL (ref 80–100)
Platelet: 169 10*3/uL (ref 150–440)
RBC: 3.75 10*6/uL — ABNORMAL LOW (ref 4.40–5.90)
RDW: 13.6 % (ref 11.5–14.5)
WBC: 8 10*3/uL (ref 3.8–10.6)

## 2014-03-06 LAB — BASIC METABOLIC PANEL
Anion Gap: 7 (ref 7–16)
BUN: 22 mg/dL — ABNORMAL HIGH (ref 7–18)
CREATININE: 1.51 mg/dL — AB (ref 0.60–1.30)
Calcium, Total: 8.5 mg/dL (ref 8.5–10.1)
Chloride: 107 mmol/L (ref 98–107)
Co2: 28 mmol/L (ref 21–32)
EGFR (African American): 51 — ABNORMAL LOW
GFR CALC NON AF AMER: 44 — AB
Glucose: 126 mg/dL — ABNORMAL HIGH (ref 65–99)
Osmolality: 288 (ref 275–301)
POTASSIUM: 3.7 mmol/L (ref 3.5–5.1)
Sodium: 142 mmol/L (ref 136–145)

## 2014-03-06 LAB — PRO B NATRIURETIC PEPTIDE: B-TYPE NATIURETIC PEPTID: 4016 pg/mL — AB (ref 0–450)

## 2014-03-06 LAB — TROPONIN I
Troponin-I: <0.02 ng/mL
Troponin-I: <0.02 ng/mL

## 2014-03-06 LAB — CK TOTAL AND CKMB (NOT AT ARMC)
CK, Total: 49 U/L
CK, Total: 17 U/L — ABNORMAL LOW
CK-MB: 0.6 ng/mL (ref 0.5–3.6)
CK-MB: 0.6 ng/mL (ref 0.5–3.6)

## 2014-03-07 DIAGNOSIS — R079 Chest pain, unspecified: Secondary | ICD-10-CM

## 2014-03-07 DIAGNOSIS — I5032 Chronic diastolic (congestive) heart failure: Secondary | ICD-10-CM

## 2014-03-07 DIAGNOSIS — E785 Hyperlipidemia, unspecified: Secondary | ICD-10-CM

## 2014-03-07 DIAGNOSIS — I4891 Unspecified atrial fibrillation: Secondary | ICD-10-CM

## 2014-03-07 DIAGNOSIS — I10 Essential (primary) hypertension: Secondary | ICD-10-CM

## 2014-03-08 DIAGNOSIS — R079 Chest pain, unspecified: Secondary | ICD-10-CM | POA: Diagnosis not present

## 2014-03-08 LAB — BASIC METABOLIC PANEL
Anion Gap: 7 (ref 7–16)
BUN: 26 mg/dL — AB (ref 7–18)
CALCIUM: 8.8 mg/dL (ref 8.5–10.1)
CO2: 33 mmol/L — AB (ref 21–32)
Chloride: 101 mmol/L (ref 98–107)
Creatinine: 1.51 mg/dL — ABNORMAL HIGH (ref 0.60–1.30)
EGFR (African American): 51 — ABNORMAL LOW
EGFR (Non-African Amer.): 44 — ABNORMAL LOW
GLUCOSE: 112 mg/dL — AB (ref 65–99)
OSMOLALITY: 287 (ref 275–301)
POTASSIUM: 3.6 mmol/L (ref 3.5–5.1)
Sodium: 141 mmol/L (ref 136–145)

## 2014-03-09 ENCOUNTER — Ambulatory Visit: Payer: Medicare Other | Admitting: Cardiovascular Disease

## 2014-03-09 ENCOUNTER — Encounter: Payer: Self-pay | Admitting: Cardiovascular Disease

## 2014-03-09 DIAGNOSIS — R079 Chest pain, unspecified: Secondary | ICD-10-CM

## 2014-03-09 DIAGNOSIS — I4891 Unspecified atrial fibrillation: Secondary | ICD-10-CM

## 2014-03-09 DIAGNOSIS — I5032 Chronic diastolic (congestive) heart failure: Secondary | ICD-10-CM

## 2014-03-10 ENCOUNTER — Telehealth: Payer: Self-pay | Admitting: *Deleted

## 2014-03-10 NOTE — Telephone Encounter (Signed)
LVM to do TCM call with patient

## 2014-03-14 ENCOUNTER — Telehealth: Payer: Self-pay | Admitting: *Deleted

## 2014-03-14 MED ORDER — ISOSORBIDE MONONITRATE ER 30 MG PO TB24: 30.0000 mg | ORAL_TABLET | Freq: Every day | ORAL | Status: AC

## 2014-03-14 NOTE — Telephone Encounter (Signed)
Spoke w/ pt's wife.  She reports that pt was d/c'd to rehab, called her yesterday and told her to come pick him up.  She picked him up yesterday took him home.  She states that she did not receive rx for Imdur 30mg  once daily sent to [facility] in [place].

## 2014-03-14 NOTE — Telephone Encounter (Signed)
Patient's wife is confused about his medications, please call her.

## 2014-03-23 ENCOUNTER — Encounter: Payer: Medicare Other | Admitting: Cardiovascular Disease

## 2014-03-24 NOTE — Progress Notes (Signed)
This encounter was created in error - please disregard.

## 2014-03-28 ENCOUNTER — Encounter: Payer: Self-pay | Admitting: Cardiovascular Disease

## 2014-03-28 ENCOUNTER — Ambulatory Visit (INDEPENDENT_AMBULATORY_CARE_PROVIDER_SITE_OTHER): Payer: Medicare Other | Admitting: Cardiovascular Disease

## 2014-03-28 VITALS — BP 110/72 | HR 86 | Ht 73.0 in | Wt 212.0 lb

## 2014-03-28 DIAGNOSIS — I5032 Chronic diastolic (congestive) heart failure: Secondary | ICD-10-CM

## 2014-03-28 DIAGNOSIS — I4891 Unspecified atrial fibrillation: Secondary | ICD-10-CM

## 2014-03-28 DIAGNOSIS — R55 Syncope and collapse: Secondary | ICD-10-CM

## 2014-03-28 DIAGNOSIS — I25708 Atherosclerosis of coronary artery bypass graft(s), unspecified, with other forms of angina pectoris: Secondary | ICD-10-CM

## 2014-03-28 DIAGNOSIS — I209 Angina pectoris, unspecified: Secondary | ICD-10-CM

## 2014-03-28 DIAGNOSIS — R0602 Shortness of breath: Secondary | ICD-10-CM

## 2014-03-28 DIAGNOSIS — I951 Orthostatic hypotension: Secondary | ICD-10-CM

## 2014-03-28 DIAGNOSIS — I509 Heart failure, unspecified: Secondary | ICD-10-CM

## 2014-03-28 DIAGNOSIS — I25810 Atherosclerosis of coronary artery bypass graft(s) without angina pectoris: Secondary | ICD-10-CM

## 2014-03-28 DIAGNOSIS — I6529 Occlusion and stenosis of unspecified carotid artery: Secondary | ICD-10-CM

## 2014-03-28 DIAGNOSIS — I639 Cerebral infarction, unspecified: Secondary | ICD-10-CM

## 2014-03-28 DIAGNOSIS — I6350 Cerebral infarction due to unspecified occlusion or stenosis of unspecified cerebral artery: Secondary | ICD-10-CM

## 2014-03-28 NOTE — Assessment & Plan Note (Addendum)
Prior history  of stroke and TIA. None recently, tolerating eliquis 5 mg twice a day

## 2014-03-28 NOTE — Progress Notes (Signed)
Patient ID: Javier [name]., male    DOB: 1936-06-01, 78 y.o.   MRN: [id number]  HPI Comments: 78 year old male with history of smoking 40 years, COPD, chronic atrial fibrillation, coronary artery disease with bypass surgery in 2007, occlusion of 2 vein grafts with PCI x4, peripheral vascular disease with bilateral carotid endarterectomies in 2002, history of TIA who has presented several times to [facility] with COPD exacerbation,  TIA, frequent episodse of COPD exacerbation/bronchitis often needing long courses of antibiotics and steroids ,  cardiac catheterization at [facility] showing patent vessels with no significant progression of his disease, who presents for routine followup . He was previously found to be resistant to Plavix by P2Y12 assay  at [facility] Abdominal aortic aneurysm with prior endograft repair,  currently on anticoagulation given history of TIAs, atrial fibrillation Numerous episodes of syncope initially felt to be secondary to orthostasis, started on fludrocortisone with mild improvement of his symptoms  In followup today, recent hospitalization mid July for shortness of breath improved with diuresis. Prior hospital admission March 8756 for diastolic CHF exacerbation.  in the hospital July 20 for chest pain. Cardiac enzymes negative x3, had been working with physical therapy with no symptoms. Chest pain came on at rest. Stress test showed ejection fraction 46%, fixed defect in the inferolateral region, previous scar. He was started on isosorbide He was discharged to [facility] but after 3 days, asked his wife to pick him up as he was having terrible service and care per the patient.   In followup today, he feels well. Blood pressure has been stable with weight 207 up to 209 pounds he is scheduled to start physical therapy at home.   appetite is good. He is taking Lasix 20 mg daily. Family reports that previously he was cheating with the salt eating chips. He is no longer doing this  Lab  work 02/14/2014 total cholesterol 145, LDL 68  Prior Carotid ultrasound done in the hospital 01/12/2012 shows no significant carotid disease , repeat study August 2014 EKG shows atrial fibrillation with ventricular rate 77 beats per minute,  nonspecific ST abnormality in the inferior leads, and anterolateral leads   Outpatient Encounter Prescriptions as of 03/28/2014  Medication Sig  . albuterol (VENTOLIN HFA) 108 (90 BASE) MCG/ACT inhaler Inhale 2 puffs into the lungs every 6 (six) hours as needed for wheezing.  Marland Kitchen ALPRAZolam (XANAX) 0.25 MG tablet Take 0.5 mg by mouth every 6 (six) hours as needed for anxiety.   . Ascorbic Acid (VITAMIN C) 1000 MG tablet Take 1,000 mg by mouth daily.  Marland Kitchen aspirin EC 81 MG tablet Take 81 mg by mouth daily.  . bisoprolol (ZEBETA) 5 MG tablet Take 5 mg by mouth 2 (two) times daily.  . budesonide-formoterol (SYMBICORT) 160-4.5 MCG/ACT inhaler Inhale 2 puffs into the lungs 2 (two) times daily.   Marland Kitchen docusate sodium (COLACE) 100 MG capsule Take 100 mg by mouth 2 (two) times daily.  Marland Kitchen ELIQUIS 5 MG TABS tablet TAKE 1 TABLET BY MOUTH TWICE DAILY  . Ferrous Sulfate (IRON) 325 (65 FE) MG TABS Take 325 mg by mouth 2 (two) times daily.   . fludrocortisone (FLORINEF) 0.1 MG tablet TAKE 1 TABLET BY MOUTH DAILY  . furosemide (LASIX) 20 MG tablet Take 20 mg by mouth daily.  Marland Kitchen ipratropium-albuterol (DUONEB) 0.5-2.5 (3) MG/3ML SOLN Take 3 mLs by nebulization every 6 (six) hours as needed (for shortness of breath).   . isosorbide mononitrate (IMDUR) 30 MG 24 hr tablet Take  1 tablet (30 mg total) by mouth daily.  Marland Kitchen leuprolide, 6 Month, (ELIGARD) 45 MG injection Inject 45 mg into the skin every 6 (six) months.  . midodrine (PROAMATINE) 5 MG tablet Take 1 tablet (5 mg total) by mouth 3 (three) times daily as needed (for low blood pressure).  . montelukast (SINGULAIR) 10 MG tablet Take 10 mg by mouth at bedtime.   . nitroGLYCERIN (NITROSTAT) 0.4 MG SL tablet Place 1 tablet (0.4 mg  total) under the tongue every 5 (five) minutes as needed for chest pain.  Marland Kitchen omega-3 acid ethyl esters (LOVAZA) 1 G capsule Take 1 g by mouth 2 (two) times daily.   Marland Kitchen oxyCODONE-acetaminophen (PERCOCET/ROXICET) 5-325 MG per tablet Take 1 tablet by mouth every 6 (six) hours as needed.   . pantoprazole (PROTONIX) 40 MG tablet Take 40 mg by mouth 3 (three) times daily.   . potassium chloride (K-DUR) 10 MEQ tablet Take 10 mEq by mouth every morning.   . rosuvastatin (CRESTOR) 20 MG tablet Take 20 mg by mouth every evening.   . senna (SENOKOT) 8.6 MG tablet Take 1 tablet by mouth daily.  . sertraline (ZOLOFT) 50 MG tablet Take 100 mg by mouth daily.   Marland Kitchen tiotropium (SPIRIVA) 18 MCG inhalation capsule Place 18 mcg into inhaler and inhale daily.    Review of Systems  Constitutional: Negative.   HENT: Negative.   Eyes: Negative.   Respiratory: Negative.   Cardiovascular: Negative.   Gastrointestinal: Negative.   Endocrine: Negative.   Musculoskeletal: Negative.   Skin: Negative.   Allergic/Immunologic: Negative.   Neurological: Negative.        Mild left arm and hand weakness  Hematological: Negative.   Psychiatric/Behavioral: Negative.   All other systems reviewed and are negative.  BP 110/72  Pulse 86  Ht 6\' 1"  (1.854 m)  Wt 212 lb (96.163 kg)  BMI 27.98 kg/m2  Physical Exam  Nursing note and vitals reviewed. Constitutional: He is oriented to person, place, and time. He appears well-developed and well-nourished.  HENT:  Head: Normocephalic.  Nose: Nose normal.  Mouth/Throat: Oropharynx is clear and moist.  Eyes: Conjunctivae are normal. Pupils are equal, round, and reactive to light.  Neck: Normal range of motion. Neck supple. No JVD present.  Cardiovascular: Normal rate, S1 normal, S2 normal and intact distal pulses.  An irregularly irregular rhythm present. Exam reveals no gallop and no friction rub.   Murmur heard.  Crescendo systolic murmur is present with a grade of 2/6   Pulmonary/Chest: Effort normal. No respiratory distress. He has decreased breath sounds. He has no wheezes. He has no rales. He exhibits no tenderness.  Abdominal: Soft. Bowel sounds are normal. He exhibits no distension. There is no tenderness.  Musculoskeletal: Normal range of motion. He exhibits no edema and no tenderness.  Lymphadenopathy:    He has no cervical adenopathy.  Neurological: He is alert and oriented to person, place, and time. Coordination normal.  Skin: Skin is warm and dry. No rash noted. No erythema.  Psychiatric: He has a normal mood and affect. His behavior is normal. Judgment and thought content normal.      Assessment and Plan

## 2014-03-28 NOTE — Assessment & Plan Note (Signed)
Chronic atrial fibrillation, heart rate well controlled. He is tolerating eliquis 5 mg twice a day with no further TIA or stroke

## 2014-03-28 NOTE — Patient Instructions (Signed)
You are doing well. No medication changes were made.  For weight of 212 or higher, take two lasix  Goal weight 207 to 209 pounds  Please call us if you have new issues that need to be addressed before your next appt.  Your physician wants you to follow-up in: 1 month.

## 2014-03-28 NOTE — Assessment & Plan Note (Signed)
recent admission to the hospital for chest pain. Stress test showing fixed defect. No further testing needed at this time. We'll continue isosorbide. Blood pressure is tolerating this well with no hypotension. Nitroglycerin for any further episodes of chest pain.

## 2014-03-28 NOTE — Assessment & Plan Note (Signed)
He has not been having to take midodrine as blood pressure has been stable, weight stable. Continue low-dose Florinef, midodrine as needed

## 2014-03-28 NOTE — Assessment & Plan Note (Signed)
No recent episodes of syncope. 30 day monitor did not show arrhythmia.

## 2014-03-28 NOTE — Assessment & Plan Note (Signed)
Recommended goal weight 207 up to 209 pounds. Taking Lasix 20 mg daily. Extra Lasix for weight of 212 pounds

## 2014-03-28 NOTE — Assessment & Plan Note (Signed)
Nonobstructive carotid disease on prior scans. Continue aggressive cholesterol management     

## 2014-03-29 ENCOUNTER — Observation Stay: Payer: Self-pay | Admitting: Internal Medicine

## 2014-03-29 LAB — COMPREHENSIVE METABOLIC PANEL
ANION GAP: 8 (ref 7–16)
AST: 25 U/L (ref 15–37)
Albumin: 3.1 g/dL — ABNORMAL LOW (ref 3.4–5.0)
Alkaline Phosphatase: 80 U/L
BILIRUBIN TOTAL: 0.4 mg/dL (ref 0.2–1.0)
BUN: 19 mg/dL — ABNORMAL HIGH (ref 7–18)
CALCIUM: 8.7 mg/dL (ref 8.5–10.1)
CO2: 29 mmol/L (ref 21–32)
CREATININE: 1.38 mg/dL — AB (ref 0.60–1.30)
Chloride: 107 mmol/L (ref 98–107)
EGFR (Non-African Amer.): 49 — ABNORMAL LOW
GFR CALC AF AMER: 57 — AB
Glucose: 97 mg/dL (ref 65–99)
OSMOLALITY: 289 (ref 275–301)
POTASSIUM: 3.7 mmol/L (ref 3.5–5.1)
SGPT (ALT): 15 U/L
SODIUM: 144 mmol/L (ref 136–145)
Total Protein: 7.7 g/dL (ref 6.4–8.2)

## 2014-03-29 LAB — URINALYSIS, COMPLETE
BILIRUBIN, UR: NEGATIVE
Bacteria: NONE SEEN
Blood: NEGATIVE
Glucose,UR: NEGATIVE mg/dL (ref 0–75)
Hyaline Cast: 2
KETONE: NEGATIVE
Leukocyte Esterase: NEGATIVE
NITRITE: NEGATIVE
PROTEIN: NEGATIVE
Ph: 5 (ref 4.5–8.0)
RBC,UR: NONE SEEN /HPF (ref 0–5)
SQUAMOUS EPITHELIAL: NONE SEEN
Specific Gravity: 1.013 (ref 1.003–1.030)
WBC UR: 1 /HPF (ref 0–5)

## 2014-03-29 LAB — PRO B NATRIURETIC PEPTIDE: B-TYPE NATIURETIC PEPTID: 3113 pg/mL — AB (ref 0–450)

## 2014-03-29 LAB — CBC
HCT: 36 % — ABNORMAL LOW (ref 40.0–52.0)
HGB: 11.7 g/dL — ABNORMAL LOW (ref 13.0–18.0)
MCH: 29.8 pg (ref 26.0–34.0)
MCHC: 32.5 g/dL (ref 32.0–36.0)
MCV: 92 fL (ref 80–100)
Platelet: 167 10*3/uL (ref 150–440)
RBC: 3.92 10*6/uL — ABNORMAL LOW (ref 4.40–5.90)
RDW: 13.1 % (ref 11.5–14.5)
WBC: 8.6 10*3/uL (ref 3.8–10.6)

## 2014-03-29 LAB — PROTIME-INR
INR: 1.3
Prothrombin Time: 15.8 secs — ABNORMAL HIGH (ref 11.5–14.7)

## 2014-03-29 LAB — TROPONIN I

## 2014-03-30 ENCOUNTER — Ambulatory Visit: Payer: Self-pay | Admitting: Neurology

## 2014-03-30 DIAGNOSIS — I059 Rheumatic mitral valve disease, unspecified: Secondary | ICD-10-CM

## 2014-04-05 ENCOUNTER — Encounter: Payer: Self-pay | Admitting: Internal Medicine

## 2014-04-05 ENCOUNTER — Ambulatory Visit (INDEPENDENT_AMBULATORY_CARE_PROVIDER_SITE_OTHER): Payer: Medicare Other | Admitting: Internal Medicine

## 2014-04-05 VITALS — BP 140/82 | HR 78 | Ht 73.0 in | Wt 212.0 lb

## 2014-04-05 DIAGNOSIS — I25810 Atherosclerosis of coronary artery bypass graft(s) without angina pectoris: Secondary | ICD-10-CM

## 2014-04-05 DIAGNOSIS — R0602 Shortness of breath: Secondary | ICD-10-CM

## 2014-04-05 NOTE — Patient Instructions (Signed)
Obtain abdominal binder as discussed   We will contact you about the Loop Monitor   Keep your follow up with Dr. Rockey Situ

## 2014-04-05 NOTE — Progress Notes (Signed)
ELECTROPHYSIOLOGY CONSULT NOTE  Patient ID: Javier [name]., MRN: [id number], DOB/AGE: 1936-04-20 78 y.o. Admit date: (Not on file) Date of Consult: 04/05/2014  Primary Physician: Cecile Sheerer, MD Primary Cardiologist:TG Chief Complaint: Syncope   HPI Javier [name]. is a 78 y.o. male  Referred for consideration of loop recorder implantation because of recurrent syncope.  He has a history of documented orthostatic hypotension with episodes of blood pressure recorded in the 80s. His syncopal episodes occurred both while standing, post micturition, also while seated. Duration these episodes frequently is in the 3-500 range.  He has a history of vascular disease. He is status post bypass surgery 2007 with subsequent PCI occluded vein grafts. He has a history of bilateral carotid endarterectomies.  He has significant left ventricular hypertrophy with a moderate LVOT gradient; most recently however only mild LVH was noted.    He has had episodes of recurrent transient neurological disruption. These are characterized by an abnormal speech pattern and can last minutes to hours and are unassociated with blood pressure abnormalities. He is undergoing evaluation with intracranial imaging; an MRI done 10/14 identified no acute infarcts. A repeat MRI scan and MRA were recently done also without specific findings. Neurological evaluation is anticipated. Is not clear whether these episodes represent TIAs or seizures.  He has permanent atrial fibrillation and is currently taking apixaban. Previous attempt to use Rivaroxaban failed because of bleeding  Functional status is largely limited by COPD-oxygen dependen;t he denies significant problems with chest pain    Past Medical History  Diagnosis Date  . CAD (coronary artery disease)   . Hypertension   . Atypical angina   . PVD (peripheral vascular disease)   . Prostate cancer   . Hyperlipidemia   . COPD (chronic  obstructive pulmonary disease)   . TIA (transient ischemic attack) 07/2010  . Myocardial infarction 11/2010  . Anemia   . Stroke   . Anxiety   . AAA (abdominal aortic aneurysm)   . CHF (congestive heart failure)       Surgical History:  Past Surgical History  Procedure Laterality Date  . Coronary artery bypass graft      6 years ago  . Carotid endarterectomy      bilateral  . Cardiac catheterization  2009, 2012    stents placed  . Abdominal aortic aneurysm repair       Home Meds: Prior to Admission medications   Medication Sig Start Date End Date Taking? Authorizing Provider  albuterol (VENTOLIN HFA) 108 (90 BASE) MCG/ACT inhaler Inhale 2 puffs into the lungs every 6 (six) hours as needed for wheezing.   Yes Historical Provider, MD  ALPRAZolam (XANAX) 0.25 MG tablet Take 0.5 mg by mouth every 6 (six) hours as needed for anxiety.    Yes Historical Provider, MD  Ascorbic Acid (VITAMIN C) 1000 MG tablet Take 1,000 mg by mouth daily.   Yes Historical Provider, MD  aspirin EC 81 MG tablet Take 81 mg by mouth daily.   Yes Historical Provider, MD  bisoprolol (ZEBETA) 5 MG tablet Take 5 mg by mouth 2 (two) times daily. 12/03/13  Yes Minna Merritts, MD  budesonide-formoterol (SYMBICORT) 160-4.5 MCG/ACT inhaler Inhale 2 puffs into the lungs 2 (two) times daily.    Yes Historical Provider, MD  docusate sodium (COLACE) 100 MG capsule Take 100 mg by mouth 2 (two) times daily.   Yes Historical Provider, MD  ELIQUIS 5 MG TABS tablet TAKE 1 TABLET BY  MOUTH TWICE DAILY 01/31/14  Yes Minna Merritts, MD  Ferrous Sulfate (IRON) 325 (65 FE) MG TABS Take 325 mg by mouth 2 (two) times daily.    Yes Historical Provider, MD  fludrocortisone (FLORINEF) 0.1 MG tablet TAKE 1 TABLET BY MOUTH DAILY 01/19/14  Yes Minna Merritts, MD  furosemide (LASIX) 20 MG tablet Take 20 mg by mouth daily.   Yes Historical Provider, MD  ipratropium-albuterol (DUONEB) 0.5-2.5 (3) MG/3ML SOLN Take 3 mLs by nebulization every 6  (six) hours as needed (for shortness of breath).    Yes Historical Provider, MD  isosorbide mononitrate (IMDUR) 30 MG 24 hr tablet Take 1 tablet (30 mg total) by mouth daily. 03/14/14  Yes Minna Merritts, MD  leuprolide, 6 Month, (ELIGARD) 45 MG injection Inject 45 mg into the skin every 6 (six) months.   Yes Historical Provider, MD  midodrine (PROAMATINE) 5 MG tablet Take 1 tablet (5 mg total) by mouth 3 (three) times daily as needed (for low blood pressure). 11/03/13  Yes Minna Merritts, MD  montelukast (SINGULAIR) 10 MG tablet Take 10 mg by mouth at bedtime.    Yes Historical Provider, MD  nitroGLYCERIN (NITROSTAT) 0.4 MG SL tablet Place 1 tablet (0.4 mg total) under the tongue every 5 (five) minutes as needed for chest pain. 10/21/13  Yes Minna Merritts, MD  omega-3 acid ethyl esters (LOVAZA) 1 G capsule Take 1 g by mouth 2 (two) times daily.    Yes Historical Provider, MD  oxyCODONE-acetaminophen (PERCOCET/ROXICET) 5-325 MG per tablet Take 1 tablet by mouth every 6 (six) hours as needed.  12/23/13  Yes Historical Provider, MD  pantoprazole (PROTONIX) 40 MG tablet Take 40 mg by mouth 3 (three) times daily.  11/01/13  Yes Historical Provider, MD  potassium chloride (K-DUR) 10 MEQ tablet Take 10 mEq by mouth every morning.    Yes Historical Provider, MD  rosuvastatin (CRESTOR) 20 MG tablet Take 20 mg by mouth every evening.    Yes Historical Provider, MD  senna (SENOKOT) 8.6 MG tablet Take 1 tablet by mouth daily.   Yes Historical Provider, MD  sertraline (ZOLOFT) 50 MG tablet Take 100 mg by mouth daily.    Yes Historical Provider, MD  tiotropium (SPIRIVA) 18 MCG inhalation capsule Place 18 mcg into inhaler and inhale daily.    Yes Historical Provider, MD    thisgies:  Allergies  Allergen Reactions  . Prednisone Other (See Comments)    Pt gets oral thrush if he doesn't swish and swallow while taking it    History   Social History  . Marital Status: Married    Spouse Name: N/A    Number  of Children: N/A  . Years of Education: N/A   Occupational History  . Not on file.   Social History Main Topics  . Smoking status: Former Smoker -- 1.50 packs/day for 40 years    Quit date: 08/20/2007  . Smokeless tobacco: Never Used  . Alcohol Use: No  . Drug Use: No  . Sexual Activity: Not on file   Other Topics Concern  . Not on file   Social History Narrative  . No narrative on file     Family History  Problem Relation Age of Onset  . Heart disease Mother   . Stomach cancer Father   . Throat cancer Father   . Heart attack Brother      ROS:  Please see the history of present illness.  All other systems reviewed and negative.    Physical Exam: Blood pressure 140/82, pulse 78, height 6\' 1"  (1.854 m), weight 212 lb (96.163 kg). General: Well developed, well nourished male wearing oxygen in no acute distress. Head: Normocephalic, atraumatic, sclera non-icteric, no xanthomas, nares are without discharge. EENT: normal Lymph Nodes:  none Back: without scoliosis/kyphosis *, no CVA tendersness Neck: Negative for carotid bruits. JVD 8 -9 cm; carotids are Lungs: Clear bilaterally to auscultation without wheezes, rales, or rhonchi. Breathing is unlabored. Heart: irregularly irregular rhythmmurmur , rubs, or gallops appreciated. Abdomen: Soft, non-tender, non-distended with normoactive bowel sounds. No hepatomegaly. No rebound/guarding. No obvious abdominal masses. Msk:  Strength and tone appear normal for age. Extremities: No clubbing or cyanosis. No  edema.  Distal pedal pulses are 2+ and equal bilaterally. Skin: Warm and Dry Neuro: Alert and oriented X 3. CN III-XII intact Grossly normal sensory and motor function . Psych:  Responds to questions appropriately with a normal affect.      Labs: Cardiac Enzymes No results found for this basename: CKTOTAL, CKMB, TROPONINI,  in the last 72 hours CBC Lab Results  Component Value Date   WBC 9.2 06/02/2013   HGB 12.7*  06/02/2013   HCT 37.3* 06/02/2013   MCV 87.1 06/02/2013   PLT 154 06/02/2013   PROTIME: No results found for this basename: LABPROT, INR,  in the last 72 hours Chemistry No results found for this basename: NA, K, CL, CO2, BUN, CREATININE, CALCIUM, LABALBU, PROT, BILITOT, ALKPHOS, ALT, AST, GLUCOSE,  in the last 168 hours Lipids Lab Results  Component Value Date   CHOL 136 04/20/2013   HDL 43 04/20/2013   LDLCALC 60 04/20/2013   TRIG 163* 04/20/2013   BNP No results found for this basename: probnp   Miscellaneous No results found for this basename: DDIMER    Radiology/Studies:  No results found.  KMM:NOTRRN fibrillation   Assessment and Plan:  Coronary artery disease  Syncope  Orthostatic hypotension  Recurrent neurological spells question seizure versus TIA  Atrial fibrillation-permanent He has recurrent syncope which by history is normal certainly orthostatic in nature. We discussed treatment strategies including the use of an abdominal binder. These are on Florinef and ProAmatine; and with some degree of hypertension in may be better to use Mestinon  Given his history of atrial fibrillation,the issue of bradycardia has been raised as a potential cause of his syncope. It recorder would be reasonable to exclude this; although, I suspect the likelihood of this being an arrhythmia is low.  Given the recurrent stereo typical neurological events, I think it is unlikely to represent TIA. He is to see neurology. MRI testing has not been striking recurrent strokes. There are no data that any anticoagulant better and another although   CHADs c score with. Rivaroxaban is higher. She is already failed this medication however. I would continue him on apixaban   Darylene Price

## 2014-05-02 ENCOUNTER — Encounter: Payer: Self-pay | Admitting: Cardiovascular Disease

## 2014-05-02 ENCOUNTER — Ambulatory Visit (INDEPENDENT_AMBULATORY_CARE_PROVIDER_SITE_OTHER): Payer: Medicare Other | Admitting: Cardiovascular Disease

## 2014-05-02 VITALS — BP 132/84 | HR 83 | Ht 73.0 in | Wt 215.2 lb

## 2014-05-02 DIAGNOSIS — I209 Angina pectoris, unspecified: Secondary | ICD-10-CM

## 2014-05-02 DIAGNOSIS — Z7901 Long term (current) use of anticoagulants: Secondary | ICD-10-CM

## 2014-05-02 DIAGNOSIS — E785 Hyperlipidemia, unspecified: Secondary | ICD-10-CM

## 2014-05-02 DIAGNOSIS — I25810 Atherosclerosis of coronary artery bypass graft(s) without angina pectoris: Secondary | ICD-10-CM

## 2014-05-02 DIAGNOSIS — I509 Heart failure, unspecified: Secondary | ICD-10-CM

## 2014-05-02 DIAGNOSIS — I25708 Atherosclerosis of coronary artery bypass graft(s), unspecified, with other forms of angina pectoris: Secondary | ICD-10-CM

## 2014-05-02 DIAGNOSIS — I5032 Chronic diastolic (congestive) heart failure: Secondary | ICD-10-CM

## 2014-05-02 DIAGNOSIS — I951 Orthostatic hypotension: Secondary | ICD-10-CM

## 2014-05-02 DIAGNOSIS — I4891 Unspecified atrial fibrillation: Secondary | ICD-10-CM

## 2014-05-02 DIAGNOSIS — M353 Polymyalgia rheumatica: Secondary | ICD-10-CM

## 2014-05-02 NOTE — Assessment & Plan Note (Signed)
Encouraged him to stay on his Crestor

## 2014-05-02 NOTE — Assessment & Plan Note (Signed)
Chronic atrial fibrillation. Heart rate relatively well controlled. Tolerating anticoagulation

## 2014-05-02 NOTE — Assessment & Plan Note (Signed)
Currently with no symptoms of angina. No further workup at this time. Continue current medication regimen. 

## 2014-05-02 NOTE — Assessment & Plan Note (Signed)
He reports recent diagnosis, elevated sedimentation rate. Improved symptoms on prednisone

## 2014-05-02 NOTE — Assessment & Plan Note (Signed)
He has been stable on Florinef 0.1 mg daily. This has significantly improved his orthostatic hypotension and syncope episodes

## 2014-05-02 NOTE — Assessment & Plan Note (Signed)
Tolerating anticoagulation. No recent bleeding

## 2014-05-02 NOTE — Assessment & Plan Note (Signed)
Weight is stable at 208 up to 210 pounds.

## 2014-05-02 NOTE — Progress Notes (Signed)
Patient ID: Javier [name]., male    DOB: 1936-07-16, 78 y.o.   MRN: [id number]  HPI Comments: 78 year old male with history of smoking 40 years, COPD, chronic atrial fibrillation, coronary artery disease with bypass surgery in 2007, occlusion of 2 vein grafts with PCI x4, peripheral vascular disease with bilateral carotid endarterectomies in 2002, history of TIA who has presented several times to [facility] with COPD exacerbation,  TIA, frequent episodse of COPD exacerbation/bronchitis often needing long courses of antibiotics and steroids ,  cardiac catheterization at [facility] showing patent vessels with no significant progression of his disease, who presents for routine followup . He was previously found to be resistant to Plavix by P2Y12 assay  at [facility] Abdominal aortic aneurysm with prior endograft repair,  currently on anticoagulation given history of TIAs, atrial fibrillation Numerous episodes of syncope initially felt to be secondary to orthostasis, started on fludrocortisone with mild improvement of his symptoms  In followup today, recent hospitalization approximately one month ago for bronchitis, diastolic CHF. Improved with antibiotics and diuresis .  He reports having recent diagnosis of polymyalgia rheumatica. Sedimentation rate was elevated. He is currently on a prednisone taper and feeling much better . Prior to starting prednisone, he had severe diffuse arthritis . He states that he has followup with urology at the beginning of October 2015 for EEG given prior history of syncope Weight is stable at 208 up to 210 pounds  Hospital admission in July for shortness of breath improved with diuresis.  Prior hospital admission March 2620 for diastolic CHF exacerbation.  in the hospital July 20 for chest pain. Cardiac enzymes negative x3, had been working with physical therapy with no symptoms. Chest pain came on at rest. Stress test showed ejection fraction 46%, fixed defect in the inferolateral  region, previous scar. He was started on isosorbide He was discharged to [facility] but after 3 days, asked his wife to pick him up as he was having terrible service and care per the patient.   Lab work 02/14/2014 total cholesterol 145, LDL 68  Prior Carotid ultrasound done in the hospital 01/12/2012 shows no significant carotid disease , repeat study August 2014 EKG shows atrial fibrillation with ventricular rate 83 beats per minute,  nonspecific ST abnormality in the inferior leads   Outpatient Encounter Prescriptions as of 05/02/2014  Medication Sig  . albuterol (VENTOLIN HFA) 108 (90 BASE) MCG/ACT inhaler Inhale 2 puffs into the lungs every 6 (six) hours as needed for wheezing.  Marland Kitchen ALPRAZolam (XANAX) 0.25 MG tablet Take 0.5 mg by mouth every 6 (six) hours as needed for anxiety.   . Ascorbic Acid (VITAMIN C) 1000 MG tablet Take 1,000 mg by mouth daily.  Marland Kitchen aspirin EC 81 MG tablet Take 81 mg by mouth daily.  . bisoprolol (ZEBETA) 5 MG tablet Take 5 mg by mouth 2 (two) times daily.  . budesonide-formoterol (SYMBICORT) 160-4.5 MCG/ACT inhaler Inhale 2 puffs into the lungs 2 (two) times daily.   Marland Kitchen docusate sodium (COLACE) 100 MG capsule Take 100 mg by mouth 2 (two) times daily.  Marland Kitchen ELIQUIS 5 MG TABS tablet TAKE 1 TABLET BY MOUTH TWICE DAILY  . Ferrous Sulfate (IRON) 325 (65 FE) MG TABS Take 325 mg by mouth 2 (two) times daily.   . fludrocortisone (FLORINEF) 0.1 MG tablet TAKE 1 TABLET BY MOUTH DAILY  . furosemide (LASIX) 20 MG tablet Take 20 mg by mouth daily.  Marland Kitchen ipratropium-albuterol (DUONEB) 0.5-2.5 (3) MG/3ML SOLN Take 3 mLs by nebulization  every 6 (six) hours as needed (for shortness of breath).   . isosorbide mononitrate (IMDUR) 30 MG 24 hr tablet Take 1 tablet (30 mg total) by mouth daily.  Marland Kitchen leuprolide, 6 Month, (ELIGARD) 45 MG injection Inject 45 mg into the skin every 6 (six) months.  . midodrine (PROAMATINE) 5 MG tablet Take 1 tablet (5 mg total) by mouth 3 (three) times daily as  needed (for low blood pressure).  . montelukast (SINGULAIR) 10 MG tablet Take 10 mg by mouth at bedtime.   . nitroGLYCERIN (NITROSTAT) 0.4 MG SL tablet Place 1 tablet (0.4 mg total) under the tongue every 5 (five) minutes as needed for chest pain.  Marland Kitchen omega-3 acid ethyl esters (LOVAZA) 1 G capsule Take 1 g by mouth 2 (two) times daily.   Marland Kitchen oxyCODONE-acetaminophen (PERCOCET/ROXICET) 5-325 MG per tablet Take 1 tablet by mouth every 6 (six) hours as needed.   . pantoprazole (PROTONIX) 40 MG tablet Take 40 mg by mouth 3 (three) times daily.   . potassium chloride (K-DUR) 10 MEQ tablet Take 10 mEq by mouth every morning.   . predniSONE (DELTASONE) 10 MG tablet 3 tabs daily for one week, then 2 tabs daily for one week, then one tab daily for one week then stop.  . rosuvastatin (CRESTOR) 20 MG tablet Take 20 mg by mouth every evening.   . senna (SENOKOT) 8.6 MG tablet Take 1 tablet by mouth daily.  . sertraline (ZOLOFT) 50 MG tablet Take 100 mg by mouth daily.   Marland Kitchen tiotropium (SPIRIVA) 18 MCG inhalation capsule Place 18 mcg into inhaler and inhale daily.     Review of Systems  Constitutional: Negative.   HENT: Negative.   Eyes: Negative.   Respiratory: Negative.   Cardiovascular: Negative.   Gastrointestinal: Negative.   Endocrine: Negative.   Musculoskeletal: Negative.   Skin: Negative.   Allergic/Immunologic: Negative.   Neurological: Negative.        Mild left arm and hand weakness  Hematological: Negative.   Psychiatric/Behavioral: Negative.   All other systems reviewed and are negative.  BP 132/84  Pulse 83  Ht 6\' 1"  (1.854 m)  Wt 215 lb 4 oz (97.637 kg)  BMI 28.41 kg/m2  Physical Exam  Nursing note and vitals reviewed. Constitutional: He is oriented to person, place, and time. He appears well-developed and well-nourished.  HENT:  Head: Normocephalic.  Nose: Nose normal.  Mouth/Throat: Oropharynx is clear and moist.  Eyes: Conjunctivae are normal. Pupils are equal, round, and  reactive to light.  Neck: Normal range of motion. Neck supple. No JVD present.  Cardiovascular: Normal rate, S1 normal, S2 normal and intact distal pulses.  An irregularly irregular rhythm present. Exam reveals no gallop and no friction rub.   Murmur heard.  Crescendo systolic murmur is present with a grade of 2/6  Pulmonary/Chest: Effort normal. No respiratory distress. He has decreased breath sounds. He has no wheezes. He has no rales. He exhibits no tenderness.  Abdominal: Soft. Bowel sounds are normal. He exhibits no distension. There is no tenderness.  Musculoskeletal: Normal range of motion. He exhibits no edema and no tenderness.  Lymphadenopathy:    He has no cervical adenopathy.  Neurological: He is alert and oriented to person, place, and time. Coordination normal.  Skin: Skin is warm and dry. No rash noted. No erythema.  Psychiatric: He has a normal mood and affect. His behavior is normal. Judgment and thought content normal.      Assessment and Plan

## 2014-05-02 NOTE — Patient Instructions (Signed)
You are doing well. No medication changes were made.  Please call us if you have new issues that need to be addressed before your next appt.  Your physician wants you to follow-up in: 3 months You will receive a reminder letter in the mail two months in advance. If you don't receive a letter, please call our office to schedule the follow-up appointment.   

## 2014-05-07 LAB — BASIC METABOLIC PANEL
Anion Gap: 8 (ref 7–16)
BUN: 30 mg/dL — ABNORMAL HIGH (ref 7–18)
CHLORIDE: 108 mmol/L — AB (ref 98–107)
Calcium, Total: 8.4 mg/dL — ABNORMAL LOW (ref 8.5–10.1)
Co2: 27 mmol/L (ref 21–32)
Creatinine: 1.14 mg/dL (ref 0.60–1.30)
EGFR (African American): >60
EGFR (Non-African Amer.): >60
Glucose: 105 mg/dL — ABNORMAL HIGH (ref 65–99)
OSMOLALITY: 292 (ref 275–301)
Potassium: 3.2 mmol/L — ABNORMAL LOW (ref 3.5–5.1)
Sodium: 143 mmol/L (ref 136–145)

## 2014-05-07 LAB — PRO B NATRIURETIC PEPTIDE: B-TYPE NATIURETIC PEPTID: 2799 pg/mL — AB (ref 0–450)

## 2014-05-07 LAB — CBC
HCT: 31.6 % — ABNORMAL LOW (ref 40.0–52.0)
HGB: 10.3 g/dL — ABNORMAL LOW (ref 13.0–18.0)
MCH: 30.6 pg (ref 26.0–34.0)
MCHC: 32.7 g/dL (ref 32.0–36.0)
MCV: 94 fL (ref 80–100)
Platelet: 165 10*3/uL (ref 150–440)
RBC: 3.38 10*6/uL — ABNORMAL LOW (ref 4.40–5.90)
RDW: 15.4 % — ABNORMAL HIGH (ref 11.5–14.5)
WBC: 13.1 10*3/uL — AB (ref 3.8–10.6)

## 2014-05-07 LAB — TROPONIN I: Troponin-I: <0.02 ng/mL

## 2014-05-07 LAB — PROTIME-INR
INR: 1.3
PROTHROMBIN TIME: 16 s — AB (ref 11.5–14.7)

## 2014-05-08 ENCOUNTER — Observation Stay: Payer: Self-pay | Admitting: Internal Medicine

## 2014-05-08 DIAGNOSIS — I5032 Chronic diastolic (congestive) heart failure: Secondary | ICD-10-CM

## 2014-05-08 DIAGNOSIS — R079 Chest pain, unspecified: Secondary | ICD-10-CM

## 2014-05-08 DIAGNOSIS — I10 Essential (primary) hypertension: Secondary | ICD-10-CM

## 2014-05-08 DIAGNOSIS — I2510 Atherosclerotic heart disease of native coronary artery without angina pectoris: Secondary | ICD-10-CM

## 2014-05-08 LAB — TROPONIN I
Troponin-I: <0.02 ng/mL
Troponin-I: <0.02 ng/mL

## 2014-05-08 LAB — SEDIMENTATION RATE: ERYTHROCYTE SED RATE: 36 mm/h — AB (ref 0–20)

## 2014-05-08 LAB — MAGNESIUM: Magnesium: 1.7 mg/dL — ABNORMAL LOW

## 2014-05-09 LAB — CBC WITH DIFFERENTIAL/PLATELET
BASOS ABS: 0.1 10*3/uL (ref 0.0–0.1)
BASOS PCT: 0.4 %
EOS PCT: 0.5 %
Eosinophil #: 0.1 10*3/uL (ref 0.0–0.7)
HCT: 32.5 % — ABNORMAL LOW (ref 40.0–52.0)
HGB: 10.1 g/dL — ABNORMAL LOW (ref 13.0–18.0)
Lymphocyte #: 1.5 10*3/uL (ref 1.0–3.6)
Lymphocyte %: 9.9 %
MCH: 29.6 pg (ref 26.0–34.0)
MCHC: 31.3 g/dL — AB (ref 32.0–36.0)
MCV: 95 fL (ref 80–100)
MONOS PCT: 3 %
Monocyte #: 0.4 x10 3/mm (ref 0.2–1.0)
NEUTROS ABS: 12.7 10*3/uL — AB (ref 1.4–6.5)
NEUTROS PCT: 86.2 %
PLATELETS: 142 10*3/uL — AB (ref 150–440)
RBC: 3.43 10*6/uL — ABNORMAL LOW (ref 4.40–5.90)
RDW: 15.1 % — AB (ref 11.5–14.5)
WBC: 14.7 10*3/uL — ABNORMAL HIGH (ref 3.8–10.6)

## 2014-05-09 LAB — BASIC METABOLIC PANEL
Anion Gap: 6 — ABNORMAL LOW (ref 7–16)
BUN: 24 mg/dL — ABNORMAL HIGH (ref 7–18)
CHLORIDE: 103 mmol/L (ref 98–107)
Calcium, Total: 9.1 mg/dL (ref 8.5–10.1)
Co2: 30 mmol/L (ref 21–32)
Creatinine: 1.09 mg/dL (ref 0.60–1.30)
EGFR (Non-African Amer.): >60
GLUCOSE: 99 mg/dL (ref 65–99)
OSMOLALITY: 282 (ref 275–301)
Potassium: 3.4 mmol/L — ABNORMAL LOW (ref 3.5–5.1)
Sodium: 139 mmol/L (ref 136–145)

## 2014-05-09 LAB — MAGNESIUM: MAGNESIUM: 1.7 mg/dL — AB

## 2014-05-11 ENCOUNTER — Telehealth: Payer: Self-pay

## 2014-05-11 NOTE — Telephone Encounter (Signed)
Patient tracks his weight and his book Would go back through his book and look at his previous weights He has a very careful recipe on how to take his Lasix In general does not need extra dosing, just needs to take what he has for any fluid retention or shortness of breath When in doubt, would take a Lasix

## 2014-05-11 NOTE — Telephone Encounter (Signed)
Debbie, RN w/ [facility] called stating that pt's wt is trending upwards:  Mon 210, Tues 214, today 215.8.  Pt denies SOB, no ankle edema, but his "face is puffy". Pt states that he is limiting his salt and water intake.   Pt has not taken Lasix today, as he was unsure of how much to take.  Advised her to have pt take his lasix dose and I will speak w/ Dr. Rockey Situ to see if pt needs to increase this dosage.  Please advise. Thank you.

## 2014-05-12 NOTE — Telephone Encounter (Signed)
Spoke w/ pt's wife.  Advised her of Dr. Donivan Scull recommendation.  She verbalizes understanding and will call back w/ any further questions or concerns.

## 2014-06-06 ENCOUNTER — Emergency Department: Payer: Self-pay | Admitting: Emergency Medicine

## 2014-06-06 LAB — BASIC METABOLIC PANEL
Anion Gap: 8 (ref 7–16)
BUN: 29 mg/dL — AB (ref 7–18)
CO2: 28 mmol/L (ref 21–32)
CREATININE: 1.48 mg/dL — AB (ref 0.60–1.30)
Calcium, Total: 8.2 mg/dL — ABNORMAL LOW (ref 8.5–10.1)
Chloride: 109 mmol/L — ABNORMAL HIGH (ref 98–107)
EGFR (African American): 60 — ABNORMAL LOW
EGFR (Non-African Amer.): 49 — ABNORMAL LOW
Glucose: 98 mg/dL (ref 65–99)
Osmolality: 295 (ref 275–301)
POTASSIUM: 3.9 mmol/L (ref 3.5–5.1)
SODIUM: 145 mmol/L (ref 136–145)

## 2014-06-06 LAB — CBC
HCT: 35.1 % — ABNORMAL LOW (ref 40.0–52.0)
HGB: 11.1 g/dL — ABNORMAL LOW (ref 13.0–18.0)
MCH: 30.1 pg (ref 26.0–34.0)
MCHC: 31.6 g/dL — ABNORMAL LOW (ref 32.0–36.0)
MCV: 95 fL (ref 80–100)
Platelet: 220 10*3/uL (ref 150–440)
RBC: 3.68 10*6/uL — ABNORMAL LOW (ref 4.40–5.90)
RDW: 14.4 % (ref 11.5–14.5)
WBC: 15 10*3/uL — AB (ref 3.8–10.6)

## 2014-06-07 LAB — TROPONIN I: Troponin-I: <0.02 ng/mL

## 2014-06-12 ENCOUNTER — Emergency Department: Payer: Self-pay | Admitting: Emergency Medicine

## 2014-06-12 LAB — COMPREHENSIVE METABOLIC PANEL
Albumin: 3.3 g/dL — ABNORMAL LOW (ref 3.4–5.0)
Alkaline Phosphatase: 56 U/L
Anion Gap: 8 (ref 7–16)
BUN: 25 mg/dL — ABNORMAL HIGH (ref 7–18)
Bilirubin,Total: 0.5 mg/dL (ref 0.2–1.0)
CALCIUM: 8.2 mg/dL — AB (ref 8.5–10.1)
CREATININE: 1.11 mg/dL (ref 0.60–1.30)
Chloride: 106 mmol/L (ref 98–107)
Co2: 29 mmol/L (ref 21–32)
EGFR (African American): >60
EGFR (Non-African Amer.): >60
GLUCOSE: 173 mg/dL — AB (ref 65–99)
OSMOLALITY: 294 (ref 275–301)
Potassium: 3.4 mmol/L — ABNORMAL LOW (ref 3.5–5.1)
SGOT(AST): 18 U/L (ref 15–37)
SGPT (ALT): 29 U/L
Sodium: 143 mmol/L (ref 136–145)
TOTAL PROTEIN: 7.1 g/dL (ref 6.4–8.2)

## 2014-06-12 LAB — CBC
HCT: 34.8 % — AB (ref 40.0–52.0)
HGB: 10.8 g/dL — ABNORMAL LOW (ref 13.0–18.0)
MCH: 30 pg (ref 26.0–34.0)
MCHC: 31.1 g/dL — ABNORMAL LOW (ref 32.0–36.0)
MCV: 96 fL (ref 80–100)
Platelet: 184 10*3/uL (ref 150–440)
RBC: 3.62 10*6/uL — ABNORMAL LOW (ref 4.40–5.90)
RDW: 14.8 % — ABNORMAL HIGH (ref 11.5–14.5)
WBC: 18.6 10*3/uL — ABNORMAL HIGH (ref 3.8–10.6)

## 2014-06-12 LAB — URINALYSIS, COMPLETE
BACTERIA: NONE SEEN
Bilirubin,UR: NEGATIVE
Blood: NEGATIVE
GLUCOSE, UR: NEGATIVE mg/dL (ref 0–75)
Ketone: NEGATIVE
Leukocyte Esterase: NEGATIVE
Nitrite: NEGATIVE
Ph: 5 (ref 4.5–8.0)
Protein: NEGATIVE
RBC, UR: NONE SEEN /HPF (ref 0–5)
SPECIFIC GRAVITY: 1.006 (ref 1.003–1.030)
SQUAMOUS EPITHELIAL: NONE SEEN
WBC UR: NONE SEEN /HPF (ref 0–5)

## 2014-06-12 LAB — TROPONIN I: Troponin-I: <0.02 ng/mL

## 2014-06-15 LAB — CBC
HCT: 31.1 % — ABNORMAL LOW (ref 40.0–52.0)
HGB: 10 g/dL — AB (ref 13.0–18.0)
MCH: 30.8 pg (ref 26.0–34.0)
MCHC: 32.3 g/dL (ref 32.0–36.0)
MCV: 95 fL (ref 80–100)
Platelet: 153 10*3/uL (ref 150–440)
RBC: 3.27 10*6/uL — ABNORMAL LOW (ref 4.40–5.90)
RDW: 14.6 % — ABNORMAL HIGH (ref 11.5–14.5)
WBC: 13.7 10*3/uL — AB (ref 3.8–10.6)

## 2014-06-15 LAB — COMPREHENSIVE METABOLIC PANEL
ANION GAP: 6 — AB (ref 7–16)
Albumin: 3 g/dL — ABNORMAL LOW (ref 3.4–5.0)
Alkaline Phosphatase: 51 U/L
BILIRUBIN TOTAL: 0.6 mg/dL (ref 0.2–1.0)
BUN: 27 mg/dL — AB (ref 7–18)
CALCIUM: 8.2 mg/dL — AB (ref 8.5–10.1)
CHLORIDE: 104 mmol/L (ref 98–107)
Co2: 33 mmol/L — ABNORMAL HIGH (ref 21–32)
Creatinine: 1.34 mg/dL — ABNORMAL HIGH (ref 0.60–1.30)
EGFR (African American): >60
GFR CALC NON AF AMER: 55 — AB
Glucose: 109 mg/dL — ABNORMAL HIGH (ref 65–99)
OSMOLALITY: 291 (ref 275–301)
Potassium: 3.3 mmol/L — ABNORMAL LOW (ref 3.5–5.1)
SGOT(AST): 16 U/L (ref 15–37)
SGPT (ALT): 18 U/L
SODIUM: 143 mmol/L (ref 136–145)
TOTAL PROTEIN: 6.9 g/dL (ref 6.4–8.2)

## 2014-06-15 LAB — URINALYSIS, COMPLETE
BILIRUBIN, UR: NEGATIVE
Bacteria: NONE SEEN
Glucose,UR: NEGATIVE mg/dL (ref 0–75)
Hyaline Cast: 5
Ketone: NEGATIVE
Leukocyte Esterase: NEGATIVE
Nitrite: NEGATIVE
Ph: 5 (ref 4.5–8.0)
Protein: 30
SPECIFIC GRAVITY: 1.024 (ref 1.003–1.030)
SQUAMOUS EPITHELIAL: NONE SEEN
WBC UR: NONE SEEN /HPF (ref 0–5)

## 2014-06-15 LAB — TROPONIN I: Troponin-I: <0.02 ng/mL

## 2014-06-16 ENCOUNTER — Observation Stay: Payer: Self-pay | Admitting: Internal Medicine

## 2014-06-16 ENCOUNTER — Ambulatory Visit: Payer: Self-pay | Admitting: Neurology

## 2014-06-16 LAB — CBC WITH DIFFERENTIAL/PLATELET
Basophil #: 0 10*3/uL (ref 0.0–0.1)
Basophil %: 0 %
EOS ABS: 0 10*3/uL (ref 0.0–0.7)
Eosinophil %: 0.1 %
HCT: 31.9 % — ABNORMAL LOW (ref 40.0–52.0)
HGB: 10.2 g/dL — AB (ref 13.0–18.0)
Lymphocyte #: 0.3 10*3/uL — ABNORMAL LOW (ref 1.0–3.6)
Lymphocyte %: 2.4 %
MCH: 30.7 pg (ref 26.0–34.0)
MCHC: 32 g/dL (ref 32.0–36.0)
MCV: 96 fL (ref 80–100)
MONOS PCT: 0.8 %
Monocyte #: 0.1 x10 3/mm — ABNORMAL LOW (ref 0.2–1.0)
Neutrophil #: 10.4 10*3/uL — ABNORMAL HIGH (ref 1.4–6.5)
Neutrophil %: 96.7 %
Platelet: 154 10*3/uL (ref 150–440)
RBC: 3.32 10*6/uL — ABNORMAL LOW (ref 4.40–5.90)
RDW: 14.9 % — AB (ref 11.5–14.5)
WBC: 10.8 10*3/uL — ABNORMAL HIGH (ref 3.8–10.6)

## 2014-06-16 LAB — BASIC METABOLIC PANEL
Anion Gap: 7 (ref 7–16)
BUN: 21 mg/dL — AB (ref 7–18)
Calcium, Total: 8.7 mg/dL (ref 8.5–10.1)
Chloride: 104 mmol/L (ref 98–107)
Co2: 32 mmol/L (ref 21–32)
Creatinine: 1.54 mg/dL — ABNORMAL HIGH (ref 0.60–1.30)
EGFR (African American): 57 — ABNORMAL LOW
EGFR (Non-African Amer.): 47 — ABNORMAL LOW
Glucose: 220 mg/dL — ABNORMAL HIGH (ref 65–99)
OSMOLALITY: 295 (ref 275–301)
Potassium: 4.8 mmol/L (ref 3.5–5.1)
SODIUM: 143 mmol/L (ref 136–145)

## 2014-06-16 LAB — TROPONIN I: Troponin-I: <0.02 ng/mL

## 2014-06-16 LAB — CK-MB
CK-MB: 0.6 ng/mL (ref 0.5–3.6)
CK-MB: <0.5 ng/mL — ABNORMAL LOW (ref 0.5–3.6)

## 2014-06-17 LAB — CBC WITH DIFFERENTIAL/PLATELET
Basophil #: 0 10*3/uL (ref 0.0–0.1)
Basophil %: 0.3 %
EOS PCT: 0.1 %
Eosinophil #: 0 10*3/uL (ref 0.0–0.7)
HCT: 29 % — ABNORMAL LOW (ref 40.0–52.0)
HGB: 9.4 g/dL — AB (ref 13.0–18.0)
LYMPHS ABS: 0.7 10*3/uL — AB (ref 1.0–3.6)
LYMPHS PCT: 6.3 %
MCH: 31 pg (ref 26.0–34.0)
MCHC: 32.6 g/dL (ref 32.0–36.0)
MCV: 95 fL (ref 80–100)
MONO ABS: 0.6 x10 3/mm (ref 0.2–1.0)
Monocyte %: 5.3 %
NEUTROS ABS: 10.2 10*3/uL — AB (ref 1.4–6.5)
NEUTROS PCT: 88 %
Platelet: 133 10*3/uL — ABNORMAL LOW (ref 150–440)
RBC: 3.05 10*6/uL — ABNORMAL LOW (ref 4.40–5.90)
RDW: 14.3 % (ref 11.5–14.5)
WBC: 11.6 10*3/uL — ABNORMAL HIGH (ref 3.8–10.6)

## 2014-06-17 LAB — BASIC METABOLIC PANEL
Anion Gap: 7 (ref 7–16)
BUN: 25 mg/dL — AB (ref 7–18)
CALCIUM: 8.3 mg/dL — AB (ref 8.5–10.1)
CREATININE: 1.2 mg/dL (ref 0.60–1.30)
Chloride: 104 mmol/L (ref 98–107)
Co2: 30 mmol/L (ref 21–32)
EGFR (African American): >60
Glucose: 139 mg/dL — ABNORMAL HIGH (ref 65–99)
OSMOLALITY: 288 (ref 275–301)
POTASSIUM: 4.2 mmol/L (ref 3.5–5.1)
SODIUM: 141 mmol/L (ref 136–145)

## 2014-06-17 LAB — HEMOGLOBIN A1C: HEMOGLOBIN A1C: 6 % (ref 4.2–6.3)

## 2014-06-26 ENCOUNTER — Emergency Department: Payer: Self-pay | Admitting: Emergency Medicine

## 2014-06-26 LAB — BASIC METABOLIC PANEL
Anion Gap: 12 (ref 7–16)
BUN: 14 mg/dL (ref 7–18)
CO2: 21 mmol/L (ref 21–32)
Calcium, Total: 7.7 mg/dL — ABNORMAL LOW (ref 8.5–10.1)
Chloride: 109 mmol/L — ABNORMAL HIGH (ref 98–107)
Creatinine: 1.07 mg/dL (ref 0.60–1.30)
EGFR (African American): >60
Glucose: 80 mg/dL (ref 65–99)
Osmolality: 283 (ref 275–301)
Potassium: 5 mmol/L (ref 3.5–5.1)
SODIUM: 142 mmol/L (ref 136–145)

## 2014-06-26 LAB — CBC
HCT: 33.3 % — ABNORMAL LOW (ref 40.0–52.0)
HGB: 10.9 g/dL — ABNORMAL LOW (ref 13.0–18.0)
MCH: 30.9 pg (ref 26.0–34.0)
MCHC: 32.7 g/dL (ref 32.0–36.0)
MCV: 95 fL (ref 80–100)
Platelet: 212 10*3/uL (ref 150–440)
RBC: 3.52 10*6/uL — ABNORMAL LOW (ref 4.40–5.90)
RDW: 14 % (ref 11.5–14.5)
WBC: 9.8 10*3/uL (ref 3.8–10.6)

## 2014-06-26 LAB — TROPONIN I

## 2014-06-26 LAB — PRO B NATRIURETIC PEPTIDE: B-Type Natriuretic Peptide: 2950 pg/mL — ABNORMAL HIGH (ref 0–450)

## 2014-06-26 LAB — PROTIME-INR
INR: 1.4
Prothrombin Time: 17 secs — ABNORMAL HIGH (ref 11.5–14.7)

## 2014-06-27 LAB — TROPONIN I: Troponin-I: <0.02 ng/mL

## 2014-07-03 ENCOUNTER — Other Ambulatory Visit: Payer: Self-pay | Admitting: Cardiovascular Disease

## 2014-07-05 ENCOUNTER — Ambulatory Visit: Payer: Medicare Other | Admitting: Physician Assistant

## 2014-07-11 ENCOUNTER — Ambulatory Visit: Payer: Medicare Other | Admitting: Cardiovascular Disease

## 2014-07-13 ENCOUNTER — Emergency Department: Payer: Self-pay | Admitting: Emergency Medicine

## 2014-07-13 LAB — CBC WITH DIFFERENTIAL/PLATELET
BASOS ABS: 0.1 10*3/uL (ref 0.0–0.1)
Basophil %: 0.7 %
EOS ABS: 0.1 10*3/uL (ref 0.0–0.7)
Eosinophil %: 0.8 %
HCT: 38.7 % — ABNORMAL LOW (ref 40.0–52.0)
HGB: 12.3 g/dL — ABNORMAL LOW (ref 13.0–18.0)
LYMPHS ABS: 2.7 10*3/uL (ref 1.0–3.6)
LYMPHS PCT: 19.6 %
MCH: 30.3 pg (ref 26.0–34.0)
MCHC: 31.8 g/dL — ABNORMAL LOW (ref 32.0–36.0)
MCV: 95 fL (ref 80–100)
MONO ABS: 0.8 x10 3/mm (ref 0.2–1.0)
MONOS PCT: 6.1 %
NEUTROS PCT: 72.8 %
Neutrophil #: 10 10*3/uL — ABNORMAL HIGH (ref 1.4–6.5)
PLATELETS: 164 10*3/uL (ref 150–440)
RBC: 4.06 10*6/uL — ABNORMAL LOW (ref 4.40–5.90)
RDW: 14.4 % (ref 11.5–14.5)
WBC: 13.8 10*3/uL — ABNORMAL HIGH (ref 3.8–10.6)

## 2014-07-13 LAB — URINALYSIS, COMPLETE
Bacteria: NONE SEEN
Bilirubin,UR: NEGATIVE
Blood: NEGATIVE
GLUCOSE, UR: NEGATIVE mg/dL (ref 0–75)
Ketone: NEGATIVE
LEUKOCYTE ESTERASE: NEGATIVE
Nitrite: NEGATIVE
PH: 6 (ref 4.5–8.0)
Protein: NEGATIVE
RBC,UR: 5 /HPF (ref 0–5)
SQUAMOUS EPITHELIAL: NONE SEEN
Specific Gravity: 1.018 (ref 1.003–1.030)
WBC UR: 1 /HPF (ref 0–5)

## 2014-07-13 LAB — BASIC METABOLIC PANEL
Anion Gap: 7 (ref 7–16)
BUN: 15 mg/dL (ref 7–18)
Calcium, Total: 8.7 mg/dL (ref 8.5–10.1)
Chloride: 104 mmol/L (ref 98–107)
Co2: 33 mmol/L — ABNORMAL HIGH (ref 21–32)
Creatinine: 1.17 mg/dL (ref 0.60–1.30)
EGFR (Non-African Amer.): >60
Glucose: 102 mg/dL — ABNORMAL HIGH (ref 65–99)
Osmolality: 288 (ref 275–301)
Potassium: 3.3 mmol/L — ABNORMAL LOW (ref 3.5–5.1)
Sodium: 144 mmol/L (ref 136–145)

## 2014-07-13 LAB — PROTIME-INR
INR: 1.1
Prothrombin Time: 13.6 secs (ref 11.5–14.7)

## 2014-07-20 ENCOUNTER — Telehealth: Payer: Self-pay

## 2014-07-20 NOTE — Telephone Encounter (Signed)
States BP is 100/62, which is low for him. States he has had all his medications today.

## 2014-07-20 NOTE — Telephone Encounter (Signed)
Spoke w/ pt's wife.  She reports that pt woke her up this am w/ SOB.  She put his O2 on him and gave him an anxiety pill. He stated that he felt that he was breathing thru a sponge. She gave him another anxiety pill 2 hrs later and pt went to sleep. He woke up around noon when the home health nurse arrived and she found his BP to be lower than normal and became concerned.  Advised pt's wife that that I we will not make med changes based on 1 reading and that his BP was most likely low due to the 2 xanax and the fact that he has just woken up.  She verbalizes understanding and will call back w/ any questions or concerns.

## 2014-07-23 ENCOUNTER — Emergency Department: Payer: Self-pay | Admitting: Emergency Medicine

## 2014-07-23 LAB — URINALYSIS, COMPLETE
BACTERIA: NONE SEEN
Bilirubin,UR: NEGATIVE
Blood: NEGATIVE
Glucose,UR: NEGATIVE mg/dL (ref 0–75)
Ketone: NEGATIVE
Leukocyte Esterase: NEGATIVE
Nitrite: NEGATIVE
PROTEIN: NEGATIVE
Ph: 5 (ref 4.5–8.0)
RBC,UR: 1 /HPF (ref 0–5)
Specific Gravity: 1.011 (ref 1.003–1.030)
Squamous Epithelial: NONE SEEN
WBC UR: 1 /HPF (ref 0–5)

## 2014-07-23 LAB — CBC
HCT: 37.5 % — AB (ref 40.0–52.0)
HGB: 11.9 g/dL — AB (ref 13.0–18.0)
MCH: 30.1 pg (ref 26.0–34.0)
MCHC: 31.9 g/dL — ABNORMAL LOW (ref 32.0–36.0)
MCV: 95 fL (ref 80–100)
Platelet: 155 10*3/uL (ref 150–440)
RBC: 3.96 10*6/uL — ABNORMAL LOW (ref 4.40–5.90)
RDW: 14.4 % (ref 11.5–14.5)
WBC: 11.3 10*3/uL — ABNORMAL HIGH (ref 3.8–10.6)

## 2014-07-23 LAB — COMPREHENSIVE METABOLIC PANEL
ALBUMIN: 3.5 g/dL (ref 3.4–5.0)
ALT: 23 U/L
ANION GAP: 8 (ref 7–16)
AST: 21 U/L (ref 15–37)
Alkaline Phosphatase: 57 U/L
BILIRUBIN TOTAL: 0.5 mg/dL (ref 0.2–1.0)
BUN: 15 mg/dL (ref 7–18)
Calcium, Total: 8.7 mg/dL (ref 8.5–10.1)
Chloride: 102 mmol/L (ref 98–107)
Co2: 31 mmol/L (ref 21–32)
Creatinine: 1.24 mg/dL (ref 0.60–1.30)
EGFR (Non-African Amer.): >60
Glucose: 143 mg/dL — ABNORMAL HIGH (ref 65–99)
Osmolality: 285 (ref 275–301)
Potassium: 4 mmol/L (ref 3.5–5.1)
SODIUM: 141 mmol/L (ref 136–145)
Total Protein: 7.6 g/dL (ref 6.4–8.2)

## 2014-07-23 LAB — LIPASE, BLOOD: Lipase: 62 U/L — ABNORMAL LOW (ref 73–393)

## 2014-07-23 LAB — TROPONIN I: Troponin-I: <0.02 ng/mL

## 2014-08-01 ENCOUNTER — Ambulatory Visit (INDEPENDENT_AMBULATORY_CARE_PROVIDER_SITE_OTHER): Payer: Medicare Other | Admitting: Cardiovascular Disease

## 2014-08-01 ENCOUNTER — Encounter: Payer: Self-pay | Admitting: Cardiovascular Disease

## 2014-08-01 VITALS — BP 132/82 | HR 92 | Ht 73.0 in | Wt 215.5 lb

## 2014-08-01 DIAGNOSIS — I4891 Unspecified atrial fibrillation: Secondary | ICD-10-CM

## 2014-08-01 DIAGNOSIS — I951 Orthostatic hypotension: Secondary | ICD-10-CM

## 2014-08-01 DIAGNOSIS — I639 Cerebral infarction, unspecified: Secondary | ICD-10-CM

## 2014-08-01 DIAGNOSIS — J449 Chronic obstructive pulmonary disease, unspecified: Secondary | ICD-10-CM

## 2014-08-01 DIAGNOSIS — I25708 Atherosclerosis of coronary artery bypass graft(s), unspecified, with other forms of angina pectoris: Secondary | ICD-10-CM

## 2014-08-01 DIAGNOSIS — R0602 Shortness of breath: Secondary | ICD-10-CM

## 2014-08-01 DIAGNOSIS — I6523 Occlusion and stenosis of bilateral carotid arteries: Secondary | ICD-10-CM

## 2014-08-01 DIAGNOSIS — I25810 Atherosclerosis of coronary artery bypass graft(s) without angina pectoris: Secondary | ICD-10-CM

## 2014-08-01 NOTE — Assessment & Plan Note (Signed)
He is doing well on eliquis twice a day. No recent strokes. Possible TIA, quickly recovered

## 2014-08-01 NOTE — Assessment & Plan Note (Signed)
Blood pressure relatively well controlled on today's visit. Again will stay on Florinef, midodrine only when necessary for systolic pressure less than 105 standing

## 2014-08-01 NOTE — Patient Instructions (Addendum)
You are doing well.  Please take midodrine morning and after lunch as needed for low pressure (105 or less) (8 Am and 2 pm)  Please call us if you have new issues that need to be addressed before your next appt.  Your physician wants you to follow-up in: 6 months.  You will receive a reminder letter in the mail two months in advance. If you don't receive a letter, please call our office to schedule the follow-up appointment.

## 2014-08-01 NOTE — Assessment & Plan Note (Signed)
On chronic oxygen, recently stable. He did report a recent cough. Recommended they call us if cough gets worse concerning for acute bronchitis or COPD flare

## 2014-08-01 NOTE — Progress Notes (Signed)
Patient ID: Javier Evitt., male    DOB: [birth year], 78 y.o.   MRN: [id number]  HPI Comments: 78 year old male with history of smoking 40 years, COPD, chronic atrial fibrillation, coronary artery disease with bypass surgery in 2007, occlusion of 2 vein grafts with PCI x4, peripheral vascular disease with bilateral carotid endarterectomies in 2002, history of TIA who has presented several times to [facility] with COPD exacerbation,  TIA, frequent episodse of COPD exacerbation/bronchitis often needing long courses of antibiotics and steroids ,  cardiac catheterization at [facility] showing patent vessels with no significant progression of his disease, who presents for routine followup of his blood pressure, atrial fibrillation . He was previously found to be resistant to Plavix by P2Y12 assay  at [facility] Abdominal aortic aneurysm with prior endograft repair,  currently on anticoagulation given history of TIAs, atrial fibrillation Numerous episodes of syncope initially felt to be secondary to orthostasis, started on fludrocortisone with mild improvement of his symptoms  In follow-up today, he reports that he feels well in general. Family reports he is very tired from his seizure medication. He is transitioning from [place] to [place]. Family reports that both of them make him tired Recent nursing notes showing orthostasis with systolic pressure of 90 when he stands up Blood pressure checked today with 126/78 with standing, 140 sitting. Overall he feels well today with no problems. He is not taking midodrine on a regular basis He does continue to take Florinef. Denies having any leg edema. Several weeks ago had an episode where he fell, felt his left side was weak, went to the emergency room. Workup was essentially negative. Has had follow-up with neurology  EKG shows atrial fibrillation with ventricular rate 92 bpm, no significant ST or T-wave changes  Other past medical history previoushospitalization  for  bronchitis, diastolic CHF. Improved with antibiotics and diuresis . He reports having recent diagnosis of polymyalgia rheumatica. Sedimentation rate was elevated. He is currently on a prednisone taper and feeling much better . Prior to starting prednisone, he had severe diffuse arthritis . He states that he has followup with urology at the beginning of October 2015 for EEG given prior history of syncope Weight is stable at 208 up to 210 pounds  Hospital admission in July for shortness of breath improved with diuresis.  Prior hospital admission March 7169 for diastolic CHF exacerbation.  in the hospital July 20 for chest pain. Cardiac enzymes negative x3, had been working with physical therapy with no symptoms. Chest pain came on at rest. Stress test showed ejection fraction 46%, fixed defect in the inferolateral region, previous scar. He was started on isosorbide He was discharged to [facility] but after 3 days, asked his wife to pick him up as he was having terrible service and care per the patient.   Lab work 02/14/2014 total cholesterol 145, LDL 68  Prior Carotid ultrasound done in the hospital 01/12/2012 shows no significant carotid disease , repeat study August 2014   Allergies  Allergen Reactions  . Prednisone Other (See Comments)    Pt gets oral thrush if he doesn't swish and swallow while taking it    Outpatient Encounter Prescriptions as of 08/01/2014  Medication Sig  . albuterol (VENTOLIN HFA) 108 (90 BASE) MCG/ACT inhaler Inhale 2 puffs into the lungs every 6 (six) hours as needed for wheezing.  Marland Kitchen ALPRAZolam (XANAX) 0.25 MG tablet Take 0.5 mg by mouth every 6 (six) hours as needed for anxiety.   . Ascorbic Acid (VITAMIN  C) 1000 MG tablet Take 1,000 mg by mouth daily.  Marland Kitchen aspirin EC 81 MG tablet Take 81 mg by mouth daily.  . bisoprolol (ZEBETA) 5 MG tablet Take 5 mg by mouth 2 (two) times daily.  . budesonide-formoterol (SYMBICORT) 160-4.5 MCG/ACT inhaler Inhale 2 puffs into  the lungs 2 (two) times daily.   Marland Kitchen docusate sodium (COLACE) 100 MG capsule Take 100 mg by mouth 2 (two) times daily.  Marland Kitchen ELIQUIS 5 MG TABS tablet TAKE 1 TABLET BY MOUTH TWICE DAILY  . Ferrous Sulfate (IRON) 325 (65 FE) MG TABS Take 325 mg by mouth 2 (two) times daily.   . fludrocortisone (FLORINEF) 0.1 MG tablet TAKE 1 TABLET BY MOUTH EVERY DAY  . furosemide (LASIX) 20 MG tablet Take 20 mg by mouth daily.  Marland Kitchen ipratropium-albuterol (DUONEB) 0.5-2.5 (3) MG/3ML SOLN Take 3 mLs by nebulization every 6 (six) hours as needed (for shortness of breath).   . isosorbide mononitrate (IMDUR) 30 MG 24 hr tablet Take 1 tablet (30 mg total) by mouth daily.  Marland Kitchen lamoTRIgine (LAMICTAL) 25 MG tablet Follow written instructions provided at office visit.  Marland Kitchen leuprolide, 6 Month, (ELIGARD) 45 MG injection Inject 45 mg into the skin every 6 (six) months.  . levETIRAcetam (KEPPRA) 750 MG tablet Take 1/2 tablet twice daily.  . midodrine (PROAMATINE) 5 MG tablet Take 1 tablet (5 mg total) by mouth 3 (three) times daily as needed (for low blood pressure).  . montelukast (SINGULAIR) 10 MG tablet Take 10 mg by mouth at bedtime.   . nitroGLYCERIN (NITROSTAT) 0.4 MG SL tablet Place 1 tablet (0.4 mg total) under the tongue every 5 (five) minutes as needed for chest pain.  Marland Kitchen omega-3 acid ethyl esters (LOVAZA) 1 G capsule Take 1 g by mouth 2 (two) times daily.   Marland Kitchen oxyCODONE-acetaminophen (PERCOCET/ROXICET) 5-325 MG per tablet Take 1 tablet by mouth every 6 (six) hours as needed.   . pantoprazole (PROTONIX) 40 MG tablet Take 40 mg by mouth 3 (three) times daily.   . potassium chloride (K-DUR) 10 MEQ tablet Take 10 mEq by mouth every morning.   . predniSONE (DELTASONE) 10 MG tablet 3 tabs daily for one week, then 2 tabs daily for one week, then one tab daily for one week then stop.  . rosuvastatin (CRESTOR) 20 MG tablet Take 20 mg by mouth every evening.   . senna (SENOKOT) 8.6 MG tablet Take 1 tablet by mouth daily.  . sertraline  (ZOLOFT) 50 MG tablet Take 150 mg by mouth daily.   Marland Kitchen tiotropium (SPIRIVA) 18 MCG inhalation capsule Place 18 mcg into inhaler and inhale daily.     Past Medical History  Diagnosis Date  . CAD (coronary artery disease)   . Hypertension   . Atypical angina   . PVD (peripheral vascular disease)   . Prostate cancer   . Hyperlipidemia   . COPD (chronic obstructive pulmonary disease)   . TIA (transient ischemic attack) 07/2010  . Myocardial infarction 11/2010  . Anemia   . Stroke   . Anxiety   . AAA (abdominal aortic aneurysm)   . CHF (congestive heart failure)   . Polymyalgia rheumatica     Past Surgical History  Procedure Laterality Date  . Coronary artery bypass graft      6 years ago  . Carotid endarterectomy      bilateral  . Cardiac catheterization  2009, 2012    stents placed  . Abdominal aortic aneurysm repair  Social History  reports that he quit smoking about 6 years ago. He has never used smokeless tobacco. He reports that he does not drink alcohol or use illicit drugs.  Family History family history includes Heart attack in his brother; Heart disease in his mother; Stomach cancer in his father; Throat cancer in his father.   Review of Systems  Constitutional: Positive for fatigue.  Eyes: Negative.   Respiratory: Positive for cough.   Cardiovascular: Negative.   Gastrointestinal: Negative.   Musculoskeletal: Negative.   Allergic/Immunologic: Negative.   Neurological: Positive for weakness.       Mild left arm and hand weakness  Hematological: Negative.   Psychiatric/Behavioral: Negative.   All other systems reviewed and are negative.  BP 132/82 mmHg  Pulse 92  Ht 6\' 1"  (1.854 m)  Wt 215 lb 8 oz (97.75 kg)  BMI 28.44 kg/m2  Physical Exam  Constitutional: He is oriented to person, place, and time. He appears well-developed and well-nourished.  HENT:  Head: Normocephalic.  Nose: Nose normal.  Mouth/Throat: Oropharynx is clear and moist.  Eyes:  Conjunctivae are normal. Pupils are equal, round, and reactive to light.  Neck: Normal range of motion. Neck supple. No JVD present.  Cardiovascular: Normal rate, regular rhythm, S1 normal, S2 normal, normal heart sounds and intact distal pulses.  Exam reveals no gallop and no friction rub.   No murmur heard. Pulmonary/Chest: Effort normal and breath sounds normal. No respiratory distress. He has no wheezes. He has no rales. He exhibits no tenderness.  Abdominal: Soft. Bowel sounds are normal. He exhibits no distension. There is no tenderness.  Musculoskeletal: Normal range of motion. He exhibits no edema or tenderness.  Lymphadenopathy:    He has no cervical adenopathy.  Neurological: He is alert and oriented to person, place, and time. Coordination normal.  Skin: Skin is warm and dry. No rash noted. No erythema.  Psychiatric: He has a normal mood and affect. His behavior is normal. Judgment and thought content normal.      Assessment and Plan   Nursing note and vitals reviewed.

## 2014-08-01 NOTE — Assessment & Plan Note (Signed)
Nonobstructive carotid disease on prior scans. Continue aggressive cholesterol management     

## 2014-08-01 NOTE — Assessment & Plan Note (Signed)
Cholesterol is at goal on the current lipid regimen. No changes to the medications were made.  

## 2014-08-01 NOTE — Assessment & Plan Note (Signed)
Currently with no symptoms of angina. No further workup at this time. Continue current medication regimen. 

## 2014-08-23 ENCOUNTER — Other Ambulatory Visit: Payer: Self-pay | Admitting: Cardiovascular Disease

## 2014-08-24 ENCOUNTER — Telehealth: Payer: Self-pay | Admitting: Cardiovascular Disease

## 2014-08-24 NOTE — Telephone Encounter (Signed)
Spoke w/ Jeani Hawking, RN w/ [facility]. She reports the previous BP readings over the past few days.  She reports that pt admits to having some bacon on Sunday. Reports that he has not taken any midodrine, but is taking his florinef daily. Wt is unchanged at 209 lbs. Pt's only sx is a HA. She is concerned about pt's h/o CVA. Please advise.  Thank you.

## 2014-08-24 NOTE — Telephone Encounter (Signed)
Pt c/o BP issue: STAT if pt c/o blurred vision, one-sided weakness or slurred speech   1. What are your last 5 BP readings?   1. Monday 08/22/14 evening 165/95  2. Tuesday  morning 191/107  185/102 evening  188/102 2pm  8pm 190/105  This morning 154/103  2. Are you having any other symptoms (ex. Dizziness, headache, blurred vision, passed out)? Headache more often  Head ache  3. What is your BP issue? bp is really out of the normal.   [facility], lynn, is with patient and she calling to let us know and to see what we need to do to help patient. Please advise

## 2014-08-24 NOTE — Telephone Encounter (Signed)
Could hold the Florinef until the blood pressure improves, possibly even take every other day Could take extra Lasix as diuresis should help blood pressure improve as well Would call back if blood pressure numbers do not improve

## 2014-08-25 NOTE — Telephone Encounter (Signed)
Left detailed message on Javier [name]'s vm w/ Dr. Donivan Scull recommendation.  Asked her to call back w/ BP readings or w/ any questions or concerns.

## 2014-08-29 LAB — CBC
HCT: 39.4 % — AB (ref 40.0–52.0)
HGB: 12.5 g/dL — ABNORMAL LOW (ref 13.0–18.0)
MCH: 29.5 pg (ref 26.0–34.0)
MCHC: 31.7 g/dL — ABNORMAL LOW (ref 32.0–36.0)
MCV: 93 fL (ref 80–100)
Platelet: 249 10*3/uL (ref 150–440)
RBC: 4.23 10*6/uL — AB (ref 4.40–5.90)
RDW: 13.8 % (ref 11.5–14.5)
WBC: 16.7 10*3/uL — AB (ref 3.8–10.6)

## 2014-08-29 LAB — BASIC METABOLIC PANEL
Anion Gap: 9 (ref 7–16)
BUN: 16 mg/dL (ref 7–18)
CALCIUM: 8.7 mg/dL (ref 8.5–10.1)
CO2: 27 mmol/L (ref 21–32)
Chloride: 106 mmol/L (ref 98–107)
Creatinine: 1.23 mg/dL (ref 0.60–1.30)
EGFR (African American): >60
EGFR (Non-African Amer.): >60
GLUCOSE: 107 mg/dL — AB (ref 65–99)
Osmolality: 285 (ref 275–301)
POTASSIUM: 3.8 mmol/L (ref 3.5–5.1)
Sodium: 142 mmol/L (ref 136–145)

## 2014-08-29 LAB — PRO B NATRIURETIC PEPTIDE: B-Type Natriuretic Peptide: 1880 pg/mL — ABNORMAL HIGH (ref 0–450)

## 2014-08-29 LAB — TROPONIN I: Troponin-I: 0.02 ng/mL

## 2014-08-30 ENCOUNTER — Observation Stay: Payer: Self-pay | Admitting: Internal Medicine

## 2014-08-30 LAB — TROPONIN I
Troponin-I: 0.02 ng/mL
Troponin-I: 0.02 ng/mL

## 2014-08-30 LAB — CK-MB
CK-MB: 0.7 ng/mL (ref 0.5–3.6)
CK-MB: 0.8 ng/mL (ref 0.5–3.6)
CK-MB: 1 ng/mL (ref 0.5–3.6)

## 2014-09-03 IMAGING — CR DG CHEST 1V PORT
1 series · 1 of 1 positions shown · non-contrast
Comparison: 01/26/2014

CLINICAL DATA: ems from home. woke up a little short of breath and
then chest pressure [DATE] started. pt took nitro x 3 and 324 aspirin
with no relief. ems gave an additional nitro x 3. pain now [DATE]. hx
afib, chf, htn, MI x 2 last in [DATE], TIA. just completed 30 day ha

EXAM:
PORTABLE CHEST - 1 VIEW

[ap]
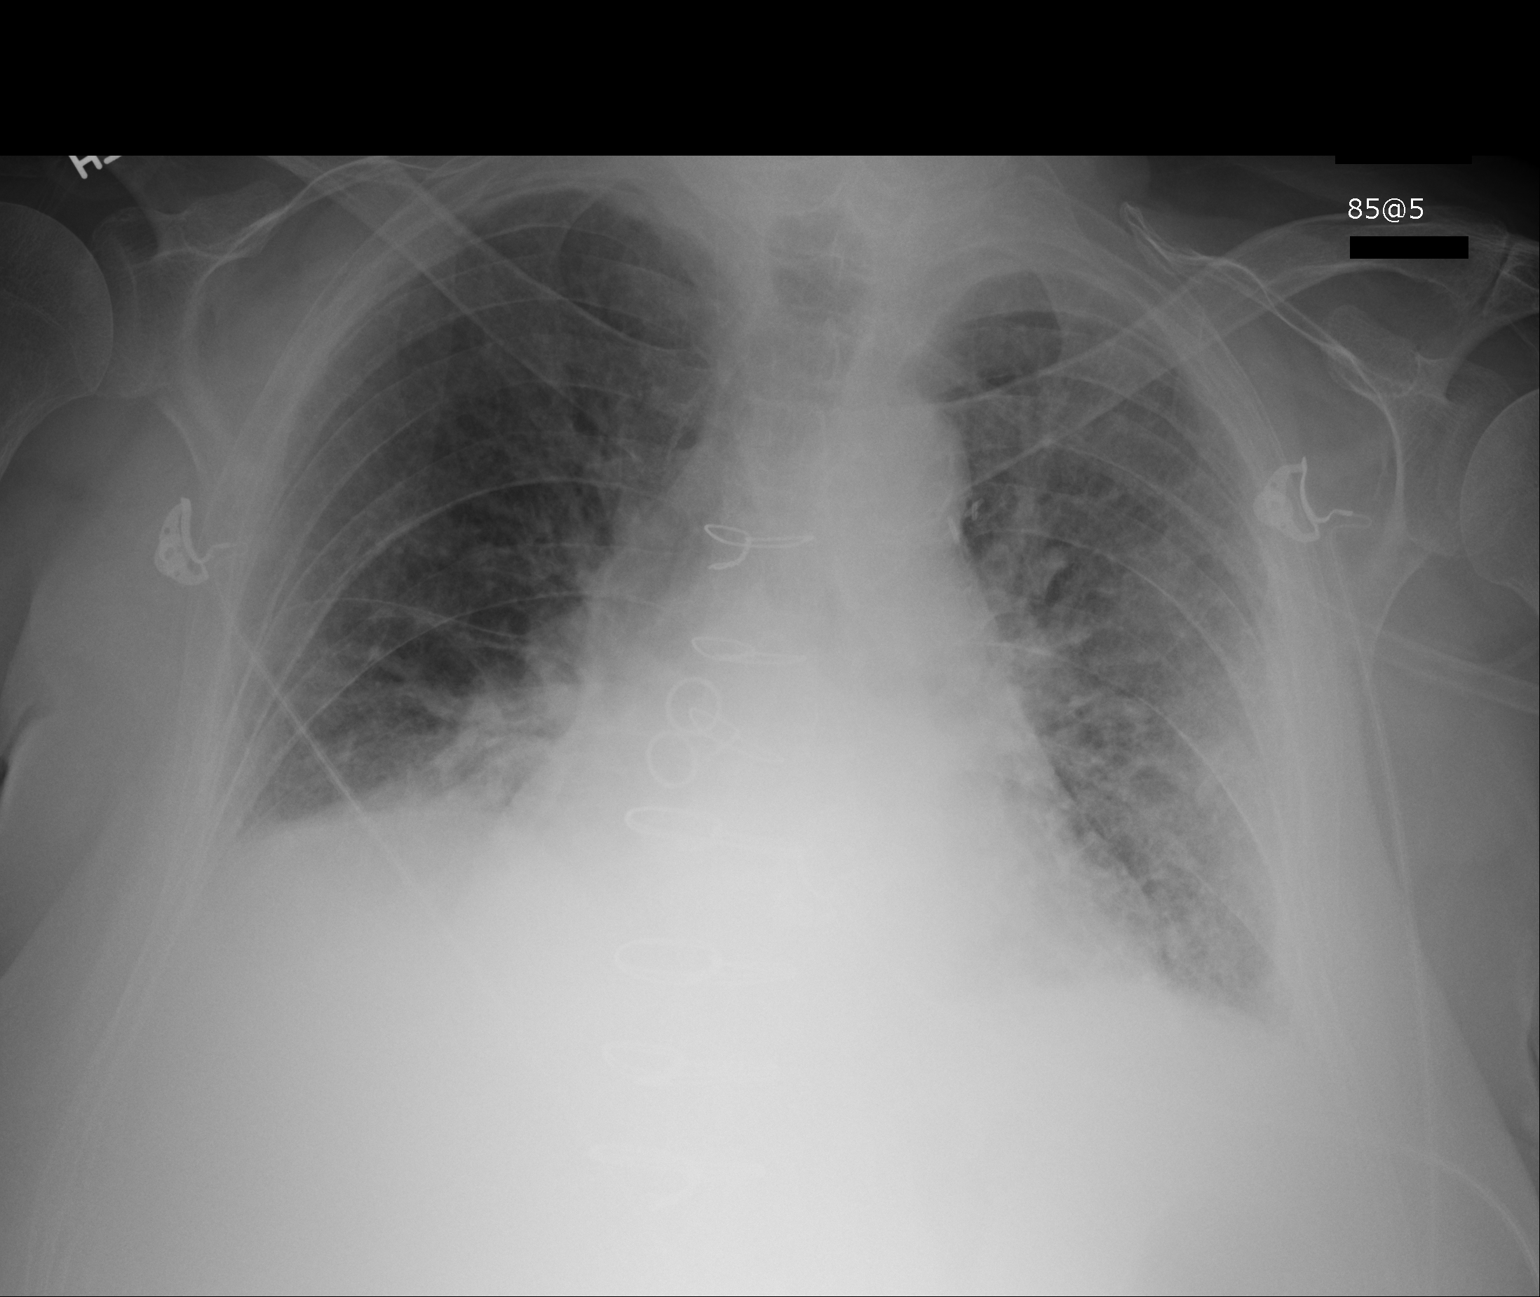

[1 of 1 positions shown; findings below may reference images not displayed]

FINDINGS: Mild interstitial and airspace edema or infiltrates diffusely, left
greater than right, increased since previous exam. Mild
cardiomegaly.
Probable small pleural effusions. Adjacent consolidation/atelectasis
in the lung bases as before. Atheromatous aorta.
Previous CABG.
IMPRESSION: 1. Some increase in asymmetric edema or infiltrates.
2. Persistent cardiomegaly and probable small effusions.

## 2014-09-04 ENCOUNTER — Other Ambulatory Visit: Payer: Self-pay | Admitting: Cardiovascular Disease

## 2014-09-13 ENCOUNTER — Encounter: Payer: Medicare Other | Admitting: Cardiovascular Disease

## 2014-09-16 ENCOUNTER — Encounter: Payer: Self-pay | Admitting: Cardiovascular Disease

## 2014-09-16 ENCOUNTER — Ambulatory Visit (INDEPENDENT_AMBULATORY_CARE_PROVIDER_SITE_OTHER): Payer: Medicare Other | Admitting: Cardiovascular Disease

## 2014-09-16 VITALS — BP 134/92 | HR 91 | Ht 73.0 in | Wt 215.0 lb

## 2014-09-16 DIAGNOSIS — R55 Syncope and collapse: Secondary | ICD-10-CM

## 2014-09-16 DIAGNOSIS — I6523 Occlusion and stenosis of bilateral carotid arteries: Secondary | ICD-10-CM

## 2014-09-16 DIAGNOSIS — I951 Orthostatic hypotension: Secondary | ICD-10-CM

## 2014-09-16 DIAGNOSIS — I25708 Atherosclerosis of coronary artery bypass graft(s), unspecified, with other forms of angina pectoris: Secondary | ICD-10-CM

## 2014-09-16 DIAGNOSIS — I5032 Chronic diastolic (congestive) heart failure: Secondary | ICD-10-CM

## 2014-09-16 DIAGNOSIS — R0602 Shortness of breath: Secondary | ICD-10-CM

## 2014-09-16 DIAGNOSIS — I4891 Unspecified atrial fibrillation: Secondary | ICD-10-CM

## 2014-09-16 NOTE — Assessment & Plan Note (Signed)
Currently with no symptoms of angina. No further workup at this time. Continue current medication regimen. Recent chest pain from rib fractures

## 2014-09-16 NOTE — Assessment & Plan Note (Signed)
Nonobstructive carotid disease on prior scans. Continue aggressive cholesterol management

## 2014-09-16 NOTE — Assessment & Plan Note (Signed)
Tolerating anticoagulation 5 mg twice a day

## 2014-09-16 NOTE — Progress Notes (Signed)
Patient ID: Javier Zimbelman., male    DOB: 1935/09/10, 79 y.o.   MRN: [id number]  HPI Comments: 79 year old male with history of smoking 40 years, COPD, chronic atrial fibrillation, coronary artery disease with bypass surgery in 2007, occlusion of 2 vein grafts with PCI x4, peripheral vascular disease with bilateral carotid endarterectomies in 2002, history of TIA who has presented several times to [facility] with COPD exacerbation,  TIA, frequent episodse of COPD exacerbation/bronchitis often needing long courses of antibiotics and steroids ,  cardiac catheterization at [facility] showing patent vessels with no significant progression of his disease, who presents for routine followup of his blood pressure, atrial fibrillation . He was previously found to be resistant to Plavix by P2Y12 assay  at [facility] Abdominal aortic aneurysm with prior endograft repair,  currently on anticoagulation given history of TIAs, atrial fibrillation Numerous episodes of syncope initially felt to be secondary to orthostasis, started on fludrocortisone with mild improvement of his symptoms  In follow-up today, he has had no recent syncopal episodes. Family feels this is from his keppra and seizure suppression. He is followed by Dr. Melrose Nakayama He is no longer taking Florinef. Blood pressure at home is stable, even doing orthostatics with visiting nurse help with only 10 point drop in his blood pressure with standing   recent coughing spells, rib fractures on the left with chest pain. He did go to the hospital for his chest pain January 11 for observation, had negative cardiac enzymes, chest CT scan. No acute findings noted. No PE In follow-up today he feels tired. He attributes this to his seizure medication. Reports that he is off Lamictal. He feels that the seizure medication is causing sweats as well Continues to have problems with anxiety. Feels the higher dose Zoloft is not helping. Not taking Xanax on a regular basis, only when  necessary. Recent hematocrit 39.4  EKG on today's visit showing atrial fibrillation with ventricular rate 91 bpm, left axis deviation  Other past medical history previoushospitalization  for bronchitis, diastolic CHF. Improved with antibiotics and diuresis . He reports having recent diagnosis of polymyalgia rheumatica. Sedimentation rate was elevated. He is currently on a prednisone taper and feeling much better . Prior to starting prednisone, he had severe diffuse arthritis . He states that he has followup with urology at the beginning of October 2015 for EEG given prior history of syncope Weight is stable at 208 up to 210 pounds  Hospital admission in July for shortness of breath improved with diuresis.  Prior hospital admission March 4628 for diastolic CHF exacerbation.  in the hospital July 20 for chest pain. Cardiac enzymes negative x3, had been working with physical therapy with no symptoms. Chest pain came on at rest. Stress test showed ejection fraction 46%, fixed defect in the inferolateral region, previous scar. He was started on isosorbide He was discharged to [facility] but after 3 days, asked his wife to pick him up as he was having terrible service and care per the patient.   Lab work 02/14/2014 total cholesterol 145, LDL 68  Prior Carotid ultrasound done in the hospital 01/12/2012 shows no significant carotid disease , repeat study August 2014   Allergies  Allergen Reactions  . Prednisone Other (See Comments)    Pt gets oral thrush if he doesn't swish and swallow while taking it    Outpatient Encounter Prescriptions as of 09/16/2014  Medication Sig  . albuterol (VENTOLIN HFA) 108 (90 BASE) MCG/ACT inhaler Inhale 2 puffs into the  lungs every 6 (six) hours as needed for wheezing.  Marland Kitchen ALPRAZolam (XANAX) 0.25 MG tablet Take 0.5 mg by mouth every 6 (six) hours as needed for anxiety.   . Ascorbic Acid (VITAMIN C) 1000 MG tablet Take 1,000 mg by mouth daily.  Marland Kitchen aspirin EC  81 MG tablet Take 81 mg by mouth daily.  . bisoprolol (ZEBETA) 5 MG tablet TAKE 1 TABLET BY MOUTH TWICE DAILY AS NEEDED  . budesonide-formoterol (SYMBICORT) 160-4.5 MCG/ACT inhaler Inhale 2 puffs into the lungs 2 (two) times daily.   Marland Kitchen docusate sodium (COLACE) 100 MG capsule Take 100 mg by mouth 2 (two) times daily.  Marland Kitchen ELIQUIS 5 MG TABS tablet TAKE 1 TABLET BY MOUTH TWICE DAILY  . Ferrous Sulfate (IRON) 325 (65 FE) MG TABS Take 325 mg by mouth 2 (two) times daily.   . fludrocortisone (FLORINEF) 0.1 MG tablet TAKE 1 TABLET BY MOUTH EVERY DAY  . furosemide (LASIX) 20 MG tablet Take 20 mg by mouth daily.  Marland Kitchen ipratropium-albuterol (DUONEB) 0.5-2.5 (3) MG/3ML SOLN Take 3 mLs by nebulization every 6 (six) hours as needed (for shortness of breath).   . isosorbide mononitrate (IMDUR) 30 MG 24 hr tablet Take 1 tablet (30 mg total) by mouth daily.  Marland Kitchen lamoTRIgine (LAMICTAL) 25 MG tablet Follow written instructions provided at office visit.  Marland Kitchen leuprolide, 6 Month, (ELIGARD) 45 MG injection Inject 45 mg into the skin every 6 (six) months.  . levETIRAcetam (KEPPRA) 750 MG tablet Take 1/2 tablet twice daily.  . midodrine (PROAMATINE) 5 MG tablet Take 1 tablet (5 mg total) by mouth 3 (three) times daily as needed (for low blood pressure).  . montelukast (SINGULAIR) 10 MG tablet Take 10 mg by mouth at bedtime.   . nitroGLYCERIN (NITROSTAT) 0.4 MG SL tablet Place 1 tablet (0.4 mg total) under the tongue every 5 (five) minutes as needed for chest pain.  Marland Kitchen omega-3 acid ethyl esters (LOVAZA) 1 G capsule Take 1 g by mouth 2 (two) times daily.   Marland Kitchen oxyCODONE-acetaminophen (PERCOCET/ROXICET) 5-325 MG per tablet Take 1 tablet by mouth every 6 (six) hours as needed.   . pantoprazole (PROTONIX) 40 MG tablet Take 40 mg by mouth 3 (three) times daily.   . potassium chloride (K-DUR) 10 MEQ tablet Take 10 mEq by mouth every morning.   . predniSONE (DELTASONE) 10 MG tablet 3 tabs daily for one week, then 2 tabs daily for one  week, then one tab daily for one week then stop.  . rosuvastatin (CRESTOR) 20 MG tablet Take 20 mg by mouth every evening.   . senna (SENOKOT) 8.6 MG tablet Take 1 tablet by mouth daily.  . sertraline (ZOLOFT) 50 MG tablet Take 150 mg by mouth daily.   Marland Kitchen tiotropium (SPIRIVA) 18 MCG inhalation capsule Place 18 mcg into inhaler and inhale daily.   . [DISCONTINUED] bisoprolol (ZEBETA) 5 MG tablet Take 5 mg by mouth 2 (two) times daily.    Past Medical History  Diagnosis Date  . CAD (coronary artery disease)   . Hypertension   . Atypical angina   . PVD (peripheral vascular disease)   . Prostate cancer   . Hyperlipidemia   . COPD (chronic obstructive pulmonary disease)   . TIA (transient ischemic attack) 07/2010  . Myocardial infarction 11/2010  . Anemia   . Stroke   . Anxiety   . AAA (abdominal aortic aneurysm)   . CHF (congestive heart failure)   . Polymyalgia rheumatica     Past  Surgical History  Procedure Laterality Date  . Coronary artery bypass graft      6 years ago  . Carotid endarterectomy      bilateral  . Cardiac catheterization  2009, 2012    stents placed  . Abdominal aortic aneurysm repair      Social History  reports that he quit smoking about 7 years ago. He has never used smokeless tobacco. He reports that he does not drink alcohol or use illicit drugs.  Family History family history includes Heart attack in his brother; Heart disease in his mother; Stomach cancer in his father; Throat cancer in his father.   Review of Systems  Constitutional: Positive for fatigue.  Respiratory: Positive for shortness of breath.   Cardiovascular: Negative.   Gastrointestinal: Negative.   Musculoskeletal: Negative.   Allergic/Immunologic: Negative.   Neurological: Positive for weakness.       Mild left arm and hand weakness  Hematological: Negative.   Psychiatric/Behavioral: Negative.   All other systems reviewed and are negative.  BP 134/92 mmHg  Pulse 91  Ht 6'  1" (1.854 m)  Wt 215 lb (97.523 kg)  BMI 28.37 kg/m2  Physical Exam  Constitutional: He is oriented to person, place, and time. He appears well-developed and well-nourished.  HENT:  Head: Normocephalic.  Nose: Nose normal.  Mouth/Throat: Oropharynx is clear and moist.  Eyes: Conjunctivae are normal. Pupils are equal, round, and reactive to light.  Neck: Normal range of motion. Neck supple. No JVD present.  Cardiovascular: Normal rate, regular rhythm, S1 normal, S2 normal, normal heart sounds and intact distal pulses.  An irregularly irregular rhythm present. Exam reveals no gallop and no friction rub.   No murmur heard. Pulmonary/Chest: Effort normal and breath sounds normal. No respiratory distress. He has decreased breath sounds. He has no wheezes. He has rales. He exhibits no tenderness.  Abdominal: Soft. Bowel sounds are normal. He exhibits no distension. There is no tenderness.  Musculoskeletal: Normal range of motion. He exhibits no edema or tenderness.  Lymphadenopathy:    He has no cervical adenopathy.  Neurological: He is alert and oriented to person, place, and time. Coordination normal.  Skin: Skin is warm and dry. No rash noted. No erythema.  Psychiatric: He has a normal mood and affect. His behavior is normal. Judgment and thought content normal.      Assessment and Plan   Nursing note and vitals reviewed.

## 2014-09-16 NOTE — Assessment & Plan Note (Signed)
Currently not on Florinef. Family reports blood pressure is holding. No recent syncope

## 2014-09-16 NOTE — Assessment & Plan Note (Signed)
Prior syncope episodes, now doing better on antiseizure medications. Followed by neurology

## 2014-09-16 NOTE — Patient Instructions (Addendum)
You are doing well. No medication changes were made.  We will send a referral to cardiac/pulm rehab They will contact you with an appt  Please call us if you have new issues that need to be addressed before your next appt.  Your physician wants you to follow-up in: 6 months.  You will receive a reminder letter in the mail two months in advance. If you don't receive a letter, please call our office to schedule the follow-up appointment.

## 2014-09-16 NOTE — Assessment & Plan Note (Signed)
Appears relatively euvolemic on today's visit. Recommended he monitor his  Weight. Continue diuretics for any weight gain

## 2014-09-19 ENCOUNTER — Ambulatory Visit: Payer: Self-pay | Admitting: Internal Medicine

## 2014-10-03 ENCOUNTER — Observation Stay: Payer: Self-pay | Admitting: Internal Medicine

## 2014-10-18 ENCOUNTER — Ambulatory Visit
Admit: 2014-10-18 | Disposition: A | Discharge status: Home / Self Care | Payer: Self-pay | Attending: Internal Medicine | Admitting: Internal Medicine

## 2014-11-01 ENCOUNTER — Ambulatory Visit (INDEPENDENT_AMBULATORY_CARE_PROVIDER_SITE_OTHER): Admitting: Cardiovascular Disease

## 2014-11-01 ENCOUNTER — Encounter: Payer: Self-pay | Admitting: Cardiovascular Disease

## 2014-11-01 VITALS — BP 132/86 | HR 92 | Ht 73.0 in | Wt 228.0 lb

## 2014-11-01 DIAGNOSIS — I4891 Unspecified atrial fibrillation: Secondary | ICD-10-CM | POA: Diagnosis not present

## 2014-11-01 DIAGNOSIS — I951 Orthostatic hypotension: Secondary | ICD-10-CM | POA: Diagnosis not present

## 2014-11-01 DIAGNOSIS — I25708 Atherosclerosis of coronary artery bypass graft(s), unspecified, with other forms of angina pectoris: Secondary | ICD-10-CM | POA: Diagnosis not present

## 2014-11-01 DIAGNOSIS — Z79899 Other long term (current) drug therapy: Secondary | ICD-10-CM

## 2014-11-01 DIAGNOSIS — I5032 Chronic diastolic (congestive) heart failure: Secondary | ICD-10-CM

## 2014-11-01 DIAGNOSIS — M353 Polymyalgia rheumatica: Secondary | ICD-10-CM

## 2014-11-01 NOTE — Assessment & Plan Note (Signed)
Blood pressure stable. He has not required Florinef or midodrine. No recent syncope

## 2014-11-01 NOTE — Assessment & Plan Note (Signed)
Currently with no symptoms of angina. No further workup at this time. Continue current medication regimen. Hold the aspirin

## 2014-11-01 NOTE — Patient Instructions (Addendum)
You are doing well.  Please hold the aspirin  Please take extra lasix as needed for shortness of breath  Please call us if you have new issues that need to be addressed before your next appt.  Your physician wants you to follow-up in: 3 months.  You will receive a reminder letter in the mail two months in advance. If you don't receive a letter, please call our office to schedule the follow-up appointment.

## 2014-11-01 NOTE — Assessment & Plan Note (Signed)
Chronic atrial fibrillation, tolerating anticoagulation. No recent TIA or stroke. We'll stop aspirin, continue elqiuis 5 mg twice a day

## 2014-11-01 NOTE — Assessment & Plan Note (Signed)
On chronic prednisone, 20 mg daily. Significant weight gain.

## 2014-11-01 NOTE — Progress Notes (Signed)
Patient ID: Javier [name]., male    DOB: 1935/10/13, 79 y.o.   MRN: [id number]  HPI Comments: 79 year old male with history of smoking 40 years, COPD, chronic atrial fibrillation, coronary artery disease with bypass surgery in 2007, occlusion of 2 vein grafts with PCI x4, peripheral vascular disease with bilateral carotid endarterectomies in 2002, history of TIA who has presented several times to [facility] with COPD exacerbation,  TIA, frequent episodse of COPD exacerbation/bronchitis often needing long courses of antibiotics and steroids ,  cardiac catheterization at [facility] showing patent vessels with no significant progression of his disease, who presents for routine followup of his blood pressure, atrial fibrillation . He was previously found to be resistant to Plavix by P2Y12 assay  at [facility] Abdominal aortic aneurysm with prior endograft repair,  currently on anticoagulation given history of TIAs, atrial fibrillation Numerous episodes of syncope initially felt to be secondary to orthostasis, started on fludrocortisone with mild improvement of his symptoms   no recent syncopal episodes. Still on keppra for seizure suppression. He is followed by Dr. Melrose Nakayama He is no longer taking Florinef. Has rarely needed midodrine for low pressures On chronic prednisone for joint pain,polymyalgia rheumatica,  significant weight gain. Recent worsening of his shortness of breath, not participating in pulmonary rehabilitation No recent bleeding but does have significant bruising on aspirin and eliquis. No recent TIA symptoms Blood pressure stable. On hospice now with morphine when necessary for shortness of breath. No orthopnea Recent upper respiratory infection every 2016, whole family was sick  EKG on today's visit showing atrial fibrillation with poor R-wave progression to the anterior precordial leads, unable to exclude old anterior MI, left axis deviation  Other past medical history Previous coughing spells, rib  fractures on the left with chest pain. He did go to the hospital for his chest pain August 29 2013 for observation, had negative cardiac enzymes, chest CT scan. No acute findings noted. No PE  Previous hospitalization  for bronchitis, diastolic CHF. Improved with antibiotics and diuresis . He reports having recent diagnosis of polymyalgia rheumatica. Sedimentation rate was elevated. He is currently on a prednisone taper and feeling much better . Prior to starting prednisone, he had severe diffuse arthritis . He states that he has followup with urology at the beginning of October 2015 for EEG given prior history of syncope Weight is stable at 208 up to 210 pounds  Hospital admission in July for shortness of breath improved with diuresis.  Prior hospital admission March 1601 for diastolic CHF exacerbation.  in the hospital July 20 for chest pain. Cardiac enzymes negative x3, had been working with physical therapy with no symptoms. Chest pain came on at rest. Stress test showed ejection fraction 46%, fixed defect in the inferolateral region, previous scar. He was started on isosorbide He was discharged to [facility] but after 3 days, asked his wife to pick him up as he was having terrible service and care per the patient.   Lab work 02/14/2014 total cholesterol 145, LDL 68  Prior Carotid ultrasound done in the hospital 01/12/2012 shows no significant carotid disease , repeat study August 2014   Allergies  Allergen Reactions  . Prednisone Other (See Comments)    Pt gets oral thrush if he doesn't swish and swallow while taking it    Outpatient Encounter Prescriptions as of 11/01/2014  Medication Sig  . albuterol (VENTOLIN HFA) 108 (90 BASE) MCG/ACT inhaler Inhale 2 puffs into the lungs every 6 (six) hours as needed  for wheezing.  Marland Kitchen ALPRAZolam (XANAX) 0.25 MG tablet Take 0.5 mg by mouth every 6 (six) hours as needed for anxiety.   . Ascorbic Acid (VITAMIN C) 1000 MG tablet Take 1,000 mg by  mouth daily.  . bisoprolol (ZEBETA) 5 MG tablet TAKE 1 TABLET BY MOUTH TWICE DAILY AS NEEDED  . budesonide-formoterol (SYMBICORT) 160-4.5 MCG/ACT inhaler Inhale 2 puffs into the lungs 2 (two) times daily.   . carisoprodol (SOMA) 250 MG tablet Take 350 mg by mouth 3 (three) times daily as needed.  . cefUROXime (CEFTIN) 500 MG tablet Take 500 mg by mouth 2 (two) times daily with a meal.  . citalopram (CELEXA) 10 MG tablet Take 20 mg by mouth daily.  Marland Kitchen docusate sodium (COLACE) 100 MG capsule Take 100 mg by mouth 2 (two) times daily.  Marland Kitchen ELIQUIS 5 MG TABS tablet TAKE 1 TABLET BY MOUTH TWICE DAILY  . Ferrous Sulfate (IRON) 325 (65 FE) MG TABS Take 325 mg by mouth 2 (two) times daily.   . furosemide (LASIX) 20 MG tablet Take 20 mg by mouth daily.  Marland Kitchen ipratropium-albuterol (DUONEB) 0.5-2.5 (3) MG/3ML SOLN Take 3 mLs by nebulization every 6 (six) hours as needed (for shortness of breath).   . isosorbide mononitrate (IMDUR) 30 MG 24 hr tablet Take 1 tablet (30 mg total) by mouth daily.  Marland Kitchen leuprolide, 6 Month, (ELIGARD) 45 MG injection Inject 45 mg into the skin every 6 (six) months.  . levETIRAcetam (KEPPRA) 750 MG tablet Take 1/2 tablet twice daily.  . midodrine (PROAMATINE) 5 MG tablet Take 1 tablet (5 mg total) by mouth 3 (three) times daily as needed (for low blood pressure).  . morphine (ROXANOL) 20 MG/ML concentrated solution Take 0.25 mL every two hours as needed.  . nitroGLYCERIN (NITROSTAT) 0.4 MG SL tablet Place 1 tablet (0.4 mg total) under the tongue every 5 (five) minutes as needed for chest pain.  Marland Kitchen omega-3 acid ethyl esters (LOVAZA) 1 G capsule Take 1 g by mouth 2 (two) times daily.   Marland Kitchen oxyCODONE-acetaminophen (PERCOCET/ROXICET) 5-325 MG per tablet Take 1 tablet by mouth every 6 (six) hours as needed.   . pantoprazole (PROTONIX) 40 MG tablet Take 40 mg by mouth 2 (two) times daily.   . potassium chloride (K-DUR) 10 MEQ tablet Take 10 mEq by mouth every morning.   . predniSONE (DELTASONE) 10  MG tablet Take 20 mg by mouth daily with breakfast.   . rosuvastatin (CRESTOR) 20 MG tablet Take 20 mg by mouth every evening.   . senna (SENOKOT) 8.6 MG tablet Take 1 tablet by mouth daily.  Marland Kitchen tiotropium (SPIRIVA) 18 MCG inhalation capsule Place 18 mcg into inhaler and inhale daily.   . [DISCONTINUED] aspirin EC 81 MG tablet Take 81 mg by mouth daily.  . [DISCONTINUED] fludrocortisone (FLORINEF) 0.1 MG tablet TAKE 1 TABLET BY MOUTH EVERY DAY (Patient not taking: Reported on 11/01/2014)  . [DISCONTINUED] lamoTRIgine (LAMICTAL) 25 MG tablet Follow written instructions provided at office visit.  . [DISCONTINUED] montelukast (SINGULAIR) 10 MG tablet Take 10 mg by mouth at bedtime.   . [DISCONTINUED] sertraline (ZOLOFT) 50 MG tablet Take 150 mg by mouth daily.     Past Medical History  Diagnosis Date  . CAD (coronary artery disease)   . Hypertension   . Atypical angina   . PVD (peripheral vascular disease)   . Prostate cancer   . Hyperlipidemia   . COPD (chronic obstructive pulmonary disease)   . TIA (transient ischemic attack) 07/2010  .  Myocardial infarction 11/2010  . Anemia   . Stroke   . Anxiety   . AAA (abdominal aortic aneurysm)   . CHF (congestive heart failure)   . Polymyalgia rheumatica     Past Surgical History  Procedure Laterality Date  . Coronary artery bypass graft      6 years ago  . Carotid endarterectomy      bilateral  . Cardiac catheterization  2009, 2012    stents placed  . Abdominal aortic aneurysm repair      Social History  reports that he quit smoking about 7 years ago. He has never used smokeless tobacco. He reports that he does not drink alcohol or use illicit drugs.  Family History family history includes Heart attack in his brother; Heart disease in his mother; Stomach cancer in his father; Throat cancer in his father.   Review of Systems  Respiratory: Positive for shortness of breath.   Cardiovascular: Negative.   Gastrointestinal: Negative.    Musculoskeletal: Negative.   Neurological: Negative.        Mild left arm and hand weakness  Hematological: Negative.   Psychiatric/Behavioral: Negative.   All other systems reviewed and are negative.  BP 132/86 mmHg  Pulse 92  Ht 6\' 1"  (1.854 m)  Wt 228 lb (103.42 kg)  BMI 30.09 kg/m2  Physical Exam  Constitutional: He is oriented to person, place, and time. He appears well-developed and well-nourished.  HENT:  Head: Normocephalic.  Nose: Nose normal.  Mouth/Throat: Oropharynx is clear and moist.  Eyes: Conjunctivae are normal. Pupils are equal, round, and reactive to light.  Neck: Normal range of motion. Neck supple. No JVD present.  Cardiovascular: Normal rate, S1 normal, S2 normal and intact distal pulses.  An irregularly irregular rhythm present. Exam reveals no gallop and no friction rub.   No murmur heard. Pulmonary/Chest: Effort normal. No respiratory distress. He has decreased breath sounds. He has no wheezes. He has rales. He exhibits no tenderness.  Abdominal: Soft. Bowel sounds are normal. He exhibits no distension. There is no tenderness.  Musculoskeletal: Normal range of motion. He exhibits no edema or tenderness.  Lymphadenopathy:    He has no cervical adenopathy.  Neurological: He is alert and oriented to person, place, and time. Coordination normal.  Skin: Skin is warm and dry. No rash noted. No erythema.  Psychiatric: He has a normal mood and affect. His behavior is normal. Judgment and thought content normal.      Assessment and Plan   Nursing note and vitals reviewed.

## 2014-11-01 NOTE — Assessment & Plan Note (Signed)
Tolerating eliquis 5 mill grams twice a day with no bleeding. He does have significant bruising, likely exacerbated by prednisone. We will stop the aspirin. No recent stent placement

## 2014-11-01 NOTE — Assessment & Plan Note (Addendum)
He takes Lasix daily. Weight has been going up on prednisone, increasing shortness of breath. We have recommended extra Lasix as needed for orthopnea symptoms.

## 2014-11-17 ENCOUNTER — Observation Stay
Admit: 2014-11-17 | Disposition: A | Discharge status: Hospice | Payer: Self-pay | Attending: Internal Medicine | Admitting: Internal Medicine

## 2014-11-17 ENCOUNTER — Ambulatory Visit: Admit: 2014-11-17 | Disposition: A | Discharge status: Home / Self Care | Payer: Self-pay | Admitting: Neurology

## 2014-11-17 DIAGNOSIS — I340 Nonrheumatic mitral (valve) insufficiency: Secondary | ICD-10-CM | POA: Diagnosis not present

## 2014-11-17 DIAGNOSIS — I2510 Atherosclerotic heart disease of native coronary artery without angina pectoris: Secondary | ICD-10-CM

## 2014-11-17 DIAGNOSIS — R55 Syncope and collapse: Secondary | ICD-10-CM

## 2014-11-17 DIAGNOSIS — I5032 Chronic diastolic (congestive) heart failure: Secondary | ICD-10-CM | POA: Diagnosis not present

## 2014-11-17 LAB — URINALYSIS, COMPLETE
BACTERIA: NONE SEEN
Bilirubin,UR: NEGATIVE
Blood: NEGATIVE
Glucose,UR: NEGATIVE mg/dL (ref 0–75)
Ketone: NEGATIVE
LEUKOCYTE ESTERASE: NEGATIVE
NITRITE: NEGATIVE
PROTEIN: NEGATIVE
Ph: 5 (ref 4.5–8.0)
RBC,UR: <1 /HPF (ref 0–5)
SPECIFIC GRAVITY: 1.016 (ref 1.003–1.030)
Squamous Epithelial: <1

## 2014-11-17 LAB — CBC WITH DIFFERENTIAL/PLATELET
BASOS ABS: 0.1 10*3/uL (ref 0.0–0.1)
Basophil %: 1.1 %
EOS ABS: 0.1 10*3/uL (ref 0.0–0.7)
Eosinophil %: 0.9 %
HCT: 38.2 % — ABNORMAL LOW (ref 40.0–52.0)
HGB: 12.6 g/dL — ABNORMAL LOW (ref 13.0–18.0)
Lymphocyte #: 1.6 10*3/uL (ref 1.0–3.6)
Lymphocyte %: 13.8 %
MCH: 30.2 pg (ref 26.0–34.0)
MCHC: 32.9 g/dL (ref 32.0–36.0)
MCV: 92 fL (ref 80–100)
MONO ABS: 0.5 x10 3/mm (ref 0.2–1.0)
Monocyte %: 4.6 %
NEUTROS PCT: 79.6 %
Neutrophil #: 9.1 10*3/uL — ABNORMAL HIGH (ref 1.4–6.5)
PLATELETS: 181 10*3/uL (ref 150–440)
RBC: 4.16 10*6/uL — AB (ref 4.40–5.90)
RDW: 14.4 % (ref 11.5–14.5)
WBC: 11.4 10*3/uL — ABNORMAL HIGH (ref 3.8–10.6)

## 2014-11-17 LAB — COMPREHENSIVE METABOLIC PANEL
ALBUMIN: 3.7 g/dL
ANION GAP: 7 (ref 7–16)
Alkaline Phosphatase: 46 U/L
BILIRUBIN TOTAL: 0.3 mg/dL
BUN: 23 mg/dL — ABNORMAL HIGH
CHLORIDE: 104 mmol/L
CO2: 27 mmol/L
CREATININE: 1.02 mg/dL
Calcium, Total: 9 mg/dL
EGFR (African American): >60
EGFR (Non-African Amer.): >60
Glucose: 124 mg/dL — ABNORMAL HIGH
Potassium: 4.4 mmol/L
SGOT(AST): 20 U/L
SGPT (ALT): 13 U/L — ABNORMAL LOW
Sodium: 138 mmol/L
Total Protein: 7.2 g/dL

## 2014-11-17 LAB — TROPONIN I: Troponin-I: <0.03 ng/mL

## 2014-11-17 LAB — PROTIME-INR
INR: 1
Prothrombin Time: 13.6 secs

## 2014-11-17 LAB — APTT: Activated PTT: 30.3 secs (ref 23.6–35.9)

## 2014-11-18 ENCOUNTER — Other Ambulatory Visit: Payer: Self-pay

## 2014-11-18 DIAGNOSIS — R55 Syncope and collapse: Secondary | ICD-10-CM

## 2014-11-20 ENCOUNTER — Emergency Department
Admit: 2014-11-20 | Disposition: A | Discharge status: Home / Self Care | Payer: Self-pay | Admitting: Emergency Medicine

## 2014-11-21 ENCOUNTER — Telehealth: Payer: Self-pay | Admitting: *Deleted

## 2014-11-21 NOTE — Telephone Encounter (Signed)
Should be okay, With closely monitor blood pressure and heart rate as they normally do Watch for excessively high blood pressure or for drops in blood pressure

## 2014-11-21 NOTE — Telephone Encounter (Signed)
ED doctor put Javier [name] on both these meds and [place] wants to know if he should continue taking them per Dr. Rockey Situ. Bisoprolol 5 mg 1 times a day Midodrine 5 mg 3 times a day

## 2014-11-22 NOTE — Telephone Encounter (Signed)
Spoke w/ pt's wife.   Advised her of Dr. Donivan Scull recommendation. She reports that pt has an appt to see Dr. Rockey Situ tomorrow.  Pt has not received event monitor yet, but should get it in the mail today.

## 2014-11-23 ENCOUNTER — Encounter: Payer: Self-pay | Admitting: Cardiovascular Disease

## 2014-11-23 ENCOUNTER — Ambulatory Visit (INDEPENDENT_AMBULATORY_CARE_PROVIDER_SITE_OTHER): Admitting: Cardiovascular Disease

## 2014-11-23 VITALS — BP 128/88 | HR 83 | Ht 73.0 in | Wt 223.8 lb

## 2014-11-23 DIAGNOSIS — D6489 Other specified anemias: Secondary | ICD-10-CM

## 2014-11-23 DIAGNOSIS — I6523 Occlusion and stenosis of bilateral carotid arteries: Secondary | ICD-10-CM

## 2014-11-23 DIAGNOSIS — E785 Hyperlipidemia, unspecified: Secondary | ICD-10-CM | POA: Diagnosis not present

## 2014-11-23 DIAGNOSIS — I4891 Unspecified atrial fibrillation: Secondary | ICD-10-CM

## 2014-11-23 DIAGNOSIS — R55 Syncope and collapse: Secondary | ICD-10-CM | POA: Diagnosis not present

## 2014-11-23 MED ORDER — FLUDROCORTISONE ACETATE 0.1 MG PO TABS: 0.1000 mg | ORAL_TABLET | Freq: Every day | ORAL | Status: AC

## 2014-11-23 NOTE — Patient Instructions (Addendum)
You are doing well.  Continue bisoprolol 5 mg twice a day for heart rate control  Please restart florinef 0.1 mg daily for blood pressure support  Please use midodrine as needed for blood pressure for <110 top number  Ok to start the 30 day monitor  Please call us if you have new issues that need to be addressed before your next appt.  Your physician wants you to follow-up in: 1 month.

## 2014-11-23 NOTE — Assessment & Plan Note (Signed)
Etiology of his syncope again is unclear, possibly orthostasis versus seizures. He supine for much of the day and given this, we'll try to avoid midodrine and hypertension. Family also unable to do medications 3 times per day Previously was doing well on Florinef without CHF exacerbation episodes. We will restart 0.1 mg daily and use midodrine only for systolic pressures less than 110 when standing. Suggested he could try to increase his Keppra slowly if tolerated. Previously had headaches on 500 mg twice a day Needs follow-up with Dr. Melrose Nakayama that he is not available until the end of April and there is nobody covering per the patient He has a 30 day monitor which he will start wearing today to rule out arrhythmia. He has done this in the past as well with no arrhythmia noted.

## 2014-11-23 NOTE — Assessment & Plan Note (Signed)
Stable on anticoagulation. No hematuria. Hematocrit 38 last week

## 2014-11-23 NOTE — Assessment & Plan Note (Signed)
Recent carotid ultrasound, no progression. Likely not contributing to recent syncope History of carotid endarterectomy

## 2014-11-23 NOTE — Progress Notes (Signed)
Patient ID: Javier Simonis., male    DOB: 07/26/1936, 79 y.o.   MRN: [id number]  HPI Comments: 79 year old male with history of smoking 40 years, COPD, chronic atrial fibrillation, coronary artery disease with bypass surgery in 2007, occlusion of 2 vein grafts with PCI x4, peripheral vascular disease with bilateral carotid endarterectomies in 2002, history of TIA, seizures maintained on Keppra followed by Dr. Melrose Nakayama, COPD exacerbations often needing long courses of antibiotics and steroids ,  cardiac catheterization at [facility] showing patent vessels with no significant progression of his disease, long history of syncope felt secondary to either orthostatic hypotension or seizures, presents for follow-up after recent episodes of syncope   He was previously found to be resistant to Plavix by P2Y12 assay  at [facility] Abdominal aortic aneurysm with prior endograft repair,  currently on anticoagulation given history of TIAs, atrial fibrillation Previously started on fludrocortisone orthostasis, Keppra for seizures with dramatic improvement of his symptoms  In follow-up today, he reports 3 syncope episodes. He was seen in the hospital on one of those episodes Daughter presents today and reports that first episode happened approximately 2 weeks ago while sitting in bed, slumped over his wife. EMTs were called. When he woke up, no memory problems noted. Second episode he was standing, fell in the bathroom, did not hurt himself. Landed on a pile of clothes Third episode he sneezed, was walking, standing position, had syncope. When he woke he had memory problems  Workup in the hospital last for has shown no new changes with echocardiogram and change, CT head, carotid ultrasounds performed for He was discharged home with resumption of his hospice care He reports blood pressure typically 053 systolic. He was given midodrine to take which she has not been taking. Daughter reports that he sleeps in a supine position  for much of the day  He previously held Florinef as blood pressure was doing well December 2015, January 2016. Unable to tolerate high-dose Keppra secondary to headaches He has not had follow-up with neurology as Dr. Melrose Nakayama is away for 3 weeks and no one is covering for him  EKG on today's visit showing atrial fibrillation with heart rate 77 bpm, poor R-wave progression to the anterior precordial leads, unable to exclude old anterior MI, left axis deviation  Other past medical history Previous coughing spells, rib fractures on the left with chest pain. He did go to the hospital for his chest pain August 29 2013 for observation, had negative cardiac enzymes, chest CT scan. No acute findings noted. No PE  Previous hospitalization  for bronchitis, diastolic CHF. Improved with antibiotics and diuresis . He reports having recent diagnosis of polymyalgia rheumatica. Sedimentation rate was elevated. He is currently on a prednisone taper and feeling much better . Prior to starting prednisone, he had severe diffuse arthritis . He states that he has followup with urology at the beginning of October 2015 for EEG given prior history of syncope Weight is stable at 208 up to 210 pounds  Hospital admission in July for shortness of breath improved with diuresis.  Prior hospital admission March 9767 for diastolic CHF exacerbation.  in the hospital July 20 for chest pain. Cardiac enzymes negative x3, had been working with physical therapy with no symptoms. Chest pain came on at rest. Stress test showed ejection fraction 46%, fixed defect in the inferolateral region, previous scar. He was started on isosorbide He was discharged to [facility] but after 3 days, asked his wife to pick him  up as he was having terrible service and care per the patient.   Lab work 02/14/2014 total cholesterol 145, LDL 68  Prior Carotid ultrasound done in the hospital 01/12/2012 shows no significant carotid disease , repeat study  August 2014   Allergies  Allergen Reactions  . Prednisone Other (See Comments)    Pt gets oral thrush if he doesn't swish and swallow while taking it    Outpatient Encounter Prescriptions as of 11/23/2014  Medication Sig  . albuterol (VENTOLIN HFA) 108 (90 BASE) MCG/ACT inhaler Inhale 2 puffs into the lungs every 6 (six) hours as needed for wheezing.  Marland Kitchen ALPRAZolam (XANAX) 0.25 MG tablet Take 0.5 mg by mouth every 6 (six) hours as needed for anxiety.   . Ascorbic Acid (VITAMIN C) 1000 MG tablet Take 1,000 mg by mouth daily.  . bisoprolol (ZEBETA) 5 MG tablet TAKE 1 TABLET BY MOUTH TWICE DAILY AS NEEDED  . budesonide-formoterol (SYMBICORT) 160-4.5 MCG/ACT inhaler Inhale 2 puffs into the lungs 2 (two) times daily.   . carisoprodol (SOMA) 250 MG tablet Take 350 mg by mouth 3 (three) times daily as needed.  . citalopram (CELEXA) 10 MG tablet Take 20 mg by mouth daily.  Marland Kitchen docusate sodium (COLACE) 100 MG capsule Take 100 mg by mouth 2 (two) times daily.  Marland Kitchen ELIQUIS 5 MG TABS tablet TAKE 1 TABLET BY MOUTH TWICE DAILY  . Ferrous Sulfate (IRON) 325 (65 FE) MG TABS Take 325 mg by mouth 2 (two) times daily.   . furosemide (LASIX) 20 MG tablet Take 20 mg by mouth daily.  Marland Kitchen guaiFENesin (MUCINEX) 600 MG 12 hr tablet Take 600 mg by mouth 2 (two) times daily as needed.  Marland Kitchen ipratropium-albuterol (DUONEB) 0.5-2.5 (3) MG/3ML SOLN Take 3 mLs by nebulization every 6 (six) hours as needed (for shortness of breath).   . isosorbide mononitrate (IMDUR) 30 MG 24 hr tablet Take 1 tablet (30 mg total) by mouth daily.  Marland Kitchen leuprolide, 6 Month, (ELIGARD) 45 MG injection Inject 45 mg into the skin every 6 (six) months.  . levETIRAcetam (KEPPRA) 750 MG tablet Take 1/2 tablet twice daily.  . midodrine (PROAMATINE) 5 MG tablet Take 1 tablet (5 mg total) by mouth 3 (three) times daily as needed (for low blood pressure).  . morphine (ROXANOL) 20 MG/ML concentrated solution Take 0.25 mL every two hours as needed.  . nitroGLYCERIN  (NITROSTAT) 0.4 MG SL tablet Place 1 tablet (0.4 mg total) under the tongue every 5 (five) minutes as needed for chest pain.  Marland Kitchen omega-3 acid ethyl esters (LOVAZA) 1 G capsule Take 1 g by mouth 2 (two) times daily.   Marland Kitchen oxyCODONE-acetaminophen (PERCOCET/ROXICET) 5-325 MG per tablet Take 1 tablet by mouth every 6 (six) hours as needed.   . pantoprazole (PROTONIX) 40 MG tablet Take 40 mg by mouth 2 (two) times daily.   . potassium chloride (K-DUR) 10 MEQ tablet Take 10 mEq by mouth every morning.   . predniSONE (DELTASONE) 10 MG tablet Take 20 mg by mouth daily with breakfast.   . rosuvastatin (CRESTOR) 20 MG tablet Take 20 mg by mouth every evening.   . senna (SENOKOT) 8.6 MG tablet Take 1 tablet by mouth daily.  Marland Kitchen tiotropium (SPIRIVA) 18 MCG inhalation capsule Place 18 mcg into inhaler and inhale daily.   . fludrocortisone (FLORINEF) 0.1 MG tablet Take 1 tablet (0.1 mg total) by mouth daily.  . [DISCONTINUED] cefUROXime (CEFTIN) 500 MG tablet Take 500 mg by mouth 2 (two) times daily  with a meal.    Past Medical History  Diagnosis Date  . CAD (coronary artery disease)   . Hypertension   . Atypical angina   . PVD (peripheral vascular disease)   . Prostate cancer   . Hyperlipidemia   . COPD (chronic obstructive pulmonary disease)   . TIA (transient ischemic attack) 07/2010  . Myocardial infarction 11/2010  . Anemia   . Stroke   . Anxiety   . AAA (abdominal aortic aneurysm)   . CHF (congestive heart failure)   . Polymyalgia rheumatica     Past Surgical History  Procedure Laterality Date  . Coronary artery bypass graft      6 years ago  . Carotid endarterectomy      bilateral  . Cardiac catheterization  2009, 2012    stents placed  . Abdominal aortic aneurysm repair      Social History  reports that he quit smoking about 7 years ago. He has never used smokeless tobacco. He reports that he does not drink alcohol or use illicit drugs.  Family History family history includes Heart  attack in his brother; Heart disease in his mother; Stomach cancer in his father; Throat cancer in his father.   Review of Systems  Constitutional: Positive for fatigue.  Cardiovascular: Negative.   Gastrointestinal: Negative.   Musculoskeletal: Negative.   Neurological: Positive for syncope and weakness.       Mild left arm and hand weakness  Hematological: Negative.   Psychiatric/Behavioral: Negative.   All other systems reviewed and are negative.  BP 128/88 mmHg  Pulse 83  Ht 6\' 1"  (1.854 m)  Wt 223 lb 12 oz (101.492 kg)  BMI 29.53 kg/m2  Physical Exam  Constitutional: He is oriented to person, place, and time. He appears well-developed and well-nourished.  HENT:  Head: Normocephalic.  Nose: Nose normal.  Mouth/Throat: Oropharynx is clear and moist.  Eyes: Conjunctivae are normal. Pupils are equal, round, and reactive to light.  Neck: Normal range of motion. Neck supple. No JVD present.  Cardiovascular: Normal rate, S1 normal, S2 normal and intact distal pulses.  An irregularly irregular rhythm present. Exam reveals no gallop and no friction rub.   No murmur heard. Pulmonary/Chest: Effort normal. No respiratory distress. He has decreased breath sounds. He has no wheezes. He has no rales. He exhibits no tenderness.  Abdominal: Soft. Bowel sounds are normal. He exhibits no distension. There is no tenderness.  Musculoskeletal: Normal range of motion. He exhibits no edema or tenderness.  Lymphadenopathy:    He has no cervical adenopathy.  Neurological: He is alert and oriented to person, place, and time. Coordination normal.  Skin: Skin is warm and dry. No rash noted. No erythema.  Psychiatric: He has a normal mood and affect. His behavior is normal. Judgment and thought content normal.      Assessment and Plan   Nursing note and vitals reviewed.

## 2014-11-23 NOTE — Assessment & Plan Note (Signed)
Tolerating eliquis at full strength 5 mg twice a day. On warfarin he had TIAs Heart rate well controlled on low-dose beta blocker

## 2014-11-23 NOTE — Assessment & Plan Note (Signed)
Cholesterol is at goal on the current lipid regimen. No changes to the medications were made.  

## 2014-12-06 NOTE — H&P (Signed)
PATIENT NAME:  Javier [name], Javier [name] MR#:  [id number] DATE OF BIRTH:  26-Apr-1936  DATE OF ADMISSION:  03/17/2012  PRIMARY CARE PHYSICIAN: Dr. Francee Nodal, [facility] PULMONOLOGIST: Dr. Raul Del   CHIEF COMPLAINT: Coughing up blood.   HISTORY OF PRESENT ILLNESS:  Javier [name] is a 79 year old Caucasian gentleman well known to our service from previous similar presentation  who comes to the Emergency Room after he was seen at [facility] on 07/28 for sinusitis, sinus congestion, and shortness of breath. He was started on p.o. prednisone taper and p.o. Levaquin for 10 days. The patient revisited today since he started noticing some streaks of blood in his sputum, which was green to  yellow color. Denies any fever. He does have some shortness of breath. His sats are 96% on room air. The patient was sent from [facility] to the Emergency Room for further evaluation and management. In the ER the patient had a noncontrast CT which shows chronic fibrotic changes along with emphysema and multiple tiny pulmonary nodules. The patient is being admitted for further evaluation and management. He received a dose of Solu-Medrol and a couple of nebulizer treatments and is feeling a little better.   PAST MEDICAL HISTORY:  1. Prostate cancer with brachytherapy.  2. Peripheral vascular disease.  3. Hypertension.  4. Coronary artery disease.  5. Chronic obstructive pulmonary disease with history of bronchitis.  6. Hyperlipidemia.  7. Coronary artery bypass graft.  8. LASIK surgery both eyes.  9. Cardiac stent placement.  10. Carotid endarterectomy.   ALLERGIES: No known drug allergies.    CURRENT MEDICATIONS:  1. Acetaminophen/tramadol 325/37.5, 1 tablet twice a day as needed.  2. DuoNebs 4-6 hours.  3. Alprazolam 0.25 every six hours as needed.  4. Aspirin 81 mg, 2 tablets daily in the morning.  5. Carvedilol 6.25 mg p.o. daily.  6. Centrum Silver p.o. daily.  7. Clonidine  0.2 mg, 1/2 tablet b.i.d.  8. Docusate 100 mg p.o. daily.  9. Eligard 45 mg per subcutaneous injection, once every six months for prostate cancer. He gets it from [facility]  10. Lasix 40 mg p.o. daily.  11. Hydrocodone cough syrup as needed for cough.  12. Imdur 30 mg extended release p.o. daily.  13. Levaquin 500 mg, which was started on July 28 for 10 days by [facility].  14. Mega Red 300-mg tablet, 1 tablet twice a day.  15. Mucinex 600 mg extended-release twice a day.  16. Nitroglycerin 0.4 mg sublingual as needed.  17. Os-Cal D1 tablet b.i.d.  18. Protonix 20 mg p.o. daily.  19. Prednisone taper that was started recently on 03/15/2012 at [facility].  20. Prednisone 5 mg on a daily basis.   SOCIAL HISTORY: Lives at home with his wife.  The patient quit smoking about two years ago. He restarted it. However, he states he quit smoking about two weeks ago.   FAMILY HISTORY: Mother had diabetes and heart disease. Father had esophageal and  stomach cancer.     REVIEW OF SYSTEMS: CONSTITUTIONAL: No fever, fatigue, or weakness. EYES: No blurred or double vision. No glaucoma. ENT: No tinnitus, ear pain, or hearing loss. RESPIRATORY: Positive for cough, wheeze, chronic obstructive pulmonary disease. CARDIOVASCULAR: No chest pain, orthopnea, or edema. GASTROINTESTINAL: No nausea, vomiting, diarrhea, or abdominal pain. GU: No dysuria or hematuria. ENDOCRINE: No polyuria or nocturia.  HEMATOLOGY: No anemia or easy bruising. SKIN: No acne or rash.  MUSCULOSKELETAL: Positive for arthritis. NEUROLOGIC: No  cerebrovascular accident or transient ischemic attack.  PSYCH: No anxiety or depression. All other systems reviewed and negative.   PHYSICAL EXAMINATION:  GENERAL: The patient is awake, alert, oriented times three, not in acute distress.   VITAL SIGNS: Afebrile, pulse 67, blood pressure 110/61. Sats are 92 percent on room air.   HEENT: Atraumatic, normocephalic. Pupils are equal, round,  and reactive to light and accommodation. Extraocular movements intact. Oral mucosa is moist.   NECK: Supple. No JVD. No carotid bruit.   RESPIRATORY: The patient does have some coarse breath sounds. No wheezing heard. No respiratory distress. Decreased breath sounds in the bases. No use of accessory muscles.   CARDIOVASCULAR: Both the heart sounds are normal. Rate, rhythm regular. PMI not lateralized. Chest is nontender.   EXTREMITIES: Good pedal pulses, good femoral pulses. No lower extremity edema.   ABDOMEN: Soft, benign, and nontender. No organomegaly. Positive bowel sounds.   NEUROLOGIC: Grossly intact cranial nerves II through XII. No motor or sensory deficit.   SKIN: Warm and dry.   LABORATORY, DIAGNOSTIC, AND RADIOLOGICAL DATA:  CT of the chest without contrast shows minimal changes of fibrosis. Multiple indeterminate, subcentimeter pulmonary nodules, the largest of which measures 6 mm. Follow-up noncontrast CT recommended in six months. Scarring in the right lung apex.   Glucose 146, BUN 25, creatinine 1.36. Rest of the chemistry normal. PT-INR within normal limits. EKG shows normal sinus rhythm.   ASSESSMENT: 79 year old Javier [name] with history of chronic obstructive pulmonary disease, chronic coronary artery disease status post bypass, congestive heart failure, diastolic, angina, peripheral vascular disease, hypertension, and prostate cancer presents with cough, shortness of breath, wheezing, and hemoptysis, admitted with:  1. Acute on chronic obstructive pulmonary disease flare. The patient was started on prednisone taper and p.o. antibiotics as outpatient July 28 on a visit to [facility].  He started having hemoptysis, went back, and was sent to the Emergency Room here for further evaluation. The patient currently does not have any large bouts of hemoptysis. Mild streak of blood in sputum noted. His stats are 92% to 96% on room air. We will start the patient on IV  Solu-Medrol daily and continue his IV Levaquin, nebulizer treatments, and oral inhalers.  2. Acute bronchitis with symptoms of hemoptysis. CT of the chest shows chronic changes with fibrosis and multiple small nodules. The patient will need a repeat CT noncontrast as outpatient. We will continue IV Levaquin that was just started two days ago. The patient is nontoxic-appearing, not having any fever.   3. Leukocytosis, appears due to #2 and due to steroids.  4. Diastolic dysfunction with history of congestive heart failure. No acute issue. Continue Coreg, Imdur,  and Lasix.  5. Coronary artery disease status post bypass with history of angina. The patient is chest-pain free. Continue aspirin, Coreg, Zetia, Imdur, and nitroglycerin sublingual.  6. Further work-up according to the patient's clinical course. Hospital admission plan was discussed with the patient. Wife was present in the Emergency Room. 7. CODE STATUS: The patient is a FULL CODE.   TIME SPENT: 50 minutes.   ____________________________ Hart Rochester Posey Pronto, MD sap:bjt D: 03/17/2012 18:45:57 ET T: 03/18/2012 07:00:59 ET JOB#: [id number]  cc: Affan Callow A. Posey Pronto, MD, <Dictator> Herbon E. Raul Del, MD Francee Nodal, MD Ilda Basset MD ELECTRONICALLY SIGNED 03/24/2012 7:19

## 2014-12-06 NOTE — H&P (Signed)
PATIENT NAME:  Javier [name], Javier [name] MR#:  [id number] DATE OF BIRTH:  December 08, 1935  DATE OF ADMISSION:  03/22/2012  ED REFERRING PHYSICIAN: Dr. Jimmye Norman PRIMARY CARE PHYSICIAN: Dr. Leonides Sake over at [place] PULMONOLOGIST: Dr. Raul Del at [place]: Shortness of breath acutely.  REASON FOR ADMISSION: Acute exacerbation of congestive heart failure, chronic obstructive pulmonary disease.   HISTORY OF PRESENT ILLNESS: This is a 79 year old male with multiple medical problems including obstructive sleep apnea, severe chronic obstructive pulmonary disease, obstructive sleep apnea, emphysema, hypertension, hypercholesterolemia that presented with above chief complaint. Per records patient was recently in the hospital from 07/30 to 08/02 for chronic obstructive pulmonary disease exacerbation. At that time it was felt that his sinusitis/bronchitis triggered his chronic obstructive pulmonary disease exacerbation. He was started on Levaquin and sent home on p.o. prednisone taper. He was doing that on Friday, Saturday. Last night he started to feel a little short of breath. He took a nebulizer, woke up this morning and continued to feel progressively short of breath and then started having audible wheezing. His wife called EMS when patient stated he felt like he couldn't breathe any longer. Upon arrival EMS noted his oxygen on room air was 91%. They gave him a nebulizer and put him on 2 liters of oxygen, came up to 98%. He was brought to the ED, was found to still be wheezing. The respiratory therapist who was seeing the patient inpatient during his last admission stated that the patient had more wheezing when compared to discharge. In the ED the patient got a total of 3 Combivent nebs with some improvement and starting to move air a little bit better. Of note, patient states that he has a diagnosis of obstructive sleep apnea but does not wear his CPAP machine secondary to not being  able to tolerate it after 15 minutes of usage so he returned it. Patient is supposed to see Dr. Raul Del in 1 to 2 weeks, however, is back in the hospital now. Hospitalist service was consulted for further inpatient workup and management.   PAST MEDICAL HISTORY: 1. Prostate cancer with brachytherapy.  2. Peripheral vascular disease. 3. Hypertension. 4. Coronary artery disease.  5. Chronic obstructive pulmonary disease with history of bronchitis.  6. Obstructive sleep apnea.  7. Hyperlipidemia. 8. Coronary artery bypass graft.  9. LASIK surgery both eyes. 10. Cardiac stent placement. 11. Carotid endarterectomy.   ALLERGIES: No known drug allergies.   CURRENT MEDICATIONS: 1. Acetaminophen/tramadol 325/37.5, 1 tablet one to two times daily as needed for pain. 2. Albuterol ipratropium 1 inhalation via nebulizer q.4-6 hours.  3. Alprazolam 0.25 mg 1 tablet q.6 hours as needed for anxiety. 4. Amlodipine besilate 1 tablet orally once a day in the evening. 5. Aspirin 81 mg daily, 2 tablets once a day in the morning. Do not crush. 6. Carvedilol 6.25 mg 1 tablet b.i.d.  7. Centrum Silver 1 tablet once a day in the morning.  8. Clonidine 0.2 mg 1/2 tablet two times a day. 9. Crestor 20 mg 1 tablet once a day at bedtime.  10. Dextromethorphan guaifenesin 10/100 take 10 mL q.4 hours as needed for cough.  11. Docusate sodium 1 tablet two times a day. 12. Eligard 45 mg q.6 months sub-Q.  13. Furosemide 20 mg 1 tablet as needed for water retention.  14. Isosorbide mononitrate 30 mg extended tablet 2 tablets once a day in the morning.  15. Levofloxacin 1 tablet q.24 hours x10 days for sinusitis.  16. Lidocaine viscus 2% mucous membrane solution 5 mL to the mucous membranes q.i.d. p.r.n. for dental pain. 17. Omega Red 300 mg tablet 1 tablet two times a day in the morning and evening. 18. Mucinex 600 mg 1 tablet extended release two times a day as needed for cough. 19. Nitrostat 0.4 mg 1 tablet q.5  minutes for chest pain for a total of 3 doses.  20. Os-Cal D 500 1 tablet two times a day. 21. Pantoprazole 20 mg delayed release 1 tablet in the morning. 22. Prednisone taper starting with 60 mg for two days and then decrease by 20 mg every two days.  23. Spiriva 18 mcg 1 capsule in the morning.  24. Symbicort 160/4.5, 2 puffs two times a day.  25. Ventolin HFA 2 puffs q.i.d. as needed for shortness of breath. 26. Vitamin C 1000 mg one tablet in the morning.  27. Zetia 10 mg oral tablet 0.5 mg once a day in the morning.   SOCIAL HISTORY: Lives at home with his wife. He quit smoking about two years ago but admitted to me that he actually has been smoking in between.   FAMILY HISTORY: Mother had diabetes and heart disease. Father has esophageal and stomach cancer.   REVIEW OF SYSTEMS: CONSTITUTIONAL: No fever, fatigue. Positive weakness. EYES: No blurred vision, double vision. No glaucoma. ENT: No tinnitus, ear pain or hearing loss. RESPIRATORY: Positive for cough, wheeze, chronic obstructive pulmonary disease. CARDIOVASCULAR: No chest pain, orthopnea or edema. GASTROINTESTINAL: Positive nausea. No vomiting, diarrhea, abdominal pain. GENITOURINARY: No dysuria, hematuria. ENDOCRINE: No polyuria, nocturia. HEMATOLOGY: No anemia, easy bruising, bleeding or swollen glands. SKIN: No acne. No rash. MUSCULOSKELETAL: Positive for arthritis. NEUROLOGICAL: No cerebrovascular accident or transient ischemic attack. PSYCH: Questionable anxiety, however, no depression. All other systems reviewed and negative.   PHYSICAL EXAMINATION: VITAL SIGNS: Vital signs while in the ED showed temperature 96.4, pulse 62, respirations 24, blood pressure 101/68, pulse ox 97% on room air.   GENERAL: Patient is awake, alert, no acute respiratory distress.   HEENT: Atraumatic, normocephalic. Pupils equal, round, reactive to light and accommodation. Extraocular movements intact. Oral mucosa is moist.   NECK: Supple. No JVD. No  carotid bruits.   RESPIRATORY: The patient does have some coarse breath sounds. He does have bibasilar wheezing heard predominantly in expiratory phase. He does have decreased breath sounds at the bases. No use of accessory muscles are noted.  CARDIOVASCULAR: S1, S2 are auscultated. There is no S3, S4. Rate and rhythm are regular. PMI is not lateralized. Chest is nontender.  EXTREMITIES: Good pedal pulses and no lower extremity edema. Strength 5/5 in bilateral lower extremities.   ABDOMEN: Soft, nontender, nondistended. Positive bowel sounds.   NEUROLOGICAL: Grossly intact cranial nerves II through XII. No motor or sensory deficits.   SKIN: Warm and dry.  LABORATORY, DIAGNOSTIC AND RADIOLOGICAL DATA: BNP 599, glucose 178, BUN 39, creatinine 1.18, sodium 139, potassium 3.7, chloride 103, bicarbonate 27, albumin 3.1, white cell count 19.2, hemoglobin 12.8, hematocrit 37.7, platelet count 219, MCV 89. Venous pH  7.47, venous pCO2 40. He did have a chest x-ray that showed pulmonary venous hypertension with mild interstitial pulmonary edema. He had an EKG that showed normal sinus rhythm, occasional PVCs, however, rate of about 75. No acute ST-T wave changes.   ASSESSMENT AND PLAN: 79 year old male past medical history of chronic obstructive pulmonary disease, coronary artery disease status post bypass, diastolic congestive heart failure, angina, peripheral vascular disease, hyperlipidemia, hypertension, prostate cancer, transient  ischemic attack presenting with shortness of breath and wheezing and recurring chronic obstructive pulmonary disease exacerbation.  1. Acute on chronic chronic obstructive pulmonary disease flare. Patient recently discharged for chronic obstructive pulmonary disease exacerbation on 08/02, now back with similar symptoms. Started on p.o. prednisone at that time. At this time he will now convert back to IV steroids, Solu-Medrol 60 mg IV q.8 hours x2 then transition back to p.o.  steroids. Currently sats 98% on room air but desaturating with exertion to the mid 80s. Currently will require oxygen with exertion while inpatient. Will hold his Symbicort for now and start him on Pulmicort nebs and DuoNebs also. Patient does have history of overlap syndrome which is chronic obstructive pulmonary disease and obstructive sleep apnea. He will take a prolonged time to return to baseline especially given that he has been noncompliant with his outpatient obstructive sleep apnea regimen. At this time will give him BiPAP 10/5 at night and hopefully this will help with his breathing symptoms and his wheezing and help with this chronic obstructive pulmonary disease exacerbation that he is having. Will also need to rule out other causes of chronic obstructive pulmonary disease exacerbation such as worsening congestive heart failure or pulmonary embolus. Will check a CTA of the chest for his mildly elevated BNP. Will give two doses of 20 mg of Lasix IV and monitor his urine output and creatinine.  2. Acute bronchitis. Currently being treated with Levaquin 500 mg daily. He has about eight days left of this. Will continue with this medication. CT of the chest without contrast shows chronic changes with fibrosis and small nodules. Again, will follow up with a CT of the chest to rule out any pulmonary embolus. Patient does appear to be nontoxic. Will continue to monitor closely.  3. Leukocytosis. Appears to be due to above and the p.o. steroids that he is on. He is afebrile at this time and nontoxic appearing. 4. Congestive heart failure. No acute issues going on. He does have some mild crackles and wheezing at the bases of his lungs and associated with mildly elevated BNP. Again will give 20 mg of IV Lasix x2 doses and monitor his urine output. 5. Coronary artery disease status post bypass with history of angina. Continue with aspirin, carvedilol, Zetia, Imdur and sublingual nitroglycerin.  6. CODE STATUS:  Patient is a FULL CODE.   TIME SPENT DICTATING AND EVALUATING PATIENT: 55 minutes.    ____________________________ Vilinda Boehringer, MD vm:cms D: 03/22/2012 17:05:16 ET T: 03/23/2012 06:29:12 ET JOB#: [id number]  cc: Vilinda Boehringer, MD, <Dictator> Francee Nodal, MD Herbon E. Raul Del, MD  Vilinda Boehringer MD ELECTRONICALLY SIGNED 03/23/2012 7:30

## 2014-12-06 NOTE — H&P (Signed)
PATIENT NAME:  Javier [name], BRIDGE MR#:  [id number] DATE OF BIRTH:  25-Oct-1935  DATE OF ADMISSION:  07/03/2012  REFERRING PHYSICIAN: Dr. Loletta Specter  PRIMARY CARE PHYSICIAN: Dr. Leonides Sake at [facility]  PULMONOLOGIST: Dr. Raul Del   REASON FOR ADMISSION: Shortness of breath, progressive.   HISTORY OF PRESENT ILLNESS: [name] is a very nice 79 year old gentleman who has history of obstructive sleep apnea on overnight oxygen but not CPAP, severe chronic obstructive pulmonary disease with emphysema, hypertension, hypercholesterolemia, coronary artery disease with angina, peripheral vascular disease and previous history of prostate cancer. The patient states that for the past 3 to 4 days he started having a little bit more cough and not able to expectorate or to cough up secretions for what he visited his pulmonary doctor, Dr. Raul Del, on Wednesday who prescribed some prednisone, gave him a shot of steroids on his buttock and give him a prescription of Levaquin. The patient has not been able to feel any better despite the fact that he has been taking his prescriptions. He is coughing more and last night he states that he started having increased shortness of breath to the point that the oxygen that he was using needed to be increased and he was complaining of not being able to breath. He had much shortness of breath when he laid flat for what he needed to sleep sitting down on the chair. The patient states that this morning he was wheezing a lot, he felt a lot of congestion and couldn't cough up anything. He denies any fever but he has been sweating and since he started to get more short of breath decided to come over here. He was brought from the EMS. His oxygen saturation was around 90% on 2 liters and needed to be increased to 3. The patient has been receiving some respiratory treatments and is starting to feel a little bit better. As it is right now he is taking 2 liters of oxygen. He denies any chest pain  or angina symptoms at this moment. No significant other problems going on.  REVIEW OF SYSTEMS: CONSTITUTIONAL: Patient denies any fever. He sweats all the time but he has been sweating more the last couple of days. He is fatigued. He denies any significant weakness. No weight loss, weight gain or pain. Actually he used to have edema and they are improved. EYES: Denies any blurry vision or redness of his eyes or pain. ENT: No nasal congestion. No epistaxis. No difficulty swallowing. RESPIRATORY: Positive cough. Positive wheezing. Negative hemoptysis. Positive dyspnea. Positive history of pneumonia x2. Positive sleep apnea on oxygen at home only at night. CARDIOVASCULAR: Negative chest pain. He does have occasional angina, hasn't been going on for a while. Positive orthopnea last night. Positive edema that has overall improved after he was given some Lasix. He is no longer taking Lasix. Negative arrhythmia. Negative syncope. GASTROINTESTINAL: No nausea, vomiting. Regular bowel movements. No constipation or diarrhea. No rectal bleeding. No melena. GENITOURINARY: Negative dysuria, hematuria. No increased frequency. No prostatitis. ENDOCRINE: No polyuria, polydipsia, polyphagia, cold or heat intolerance or thyroid problems. HEME/LYMPH: No swollen glands. No anemia. No easy bruising. MUSCULOSKELETAL: No pain in joints. No gout. NEUROLOGIC: No CVAs. No transient ischemic attacks. No ataxia. No numbness. PSYCH: No depression or anxiety.   PAST MEDICAL HISTORY:  1. Chronic obstructive pulmonary disease.  2. History of prostate cancer, status post brachytherapy.  3. History of pneumonia x2.  4. Peripheral vascular disease.  5. Coronary artery disease.  6. Hypertension.  7. Obstructive sleep apnea with oxygen at night. No CPAP.  8. Hyperlipidemia.  9. Diastolic dysfunction congestive heart failure, happened once with pulmonary edema.   PAST SURGICAL HISTORY:  1. Coronary artery bypass graft.  2. Carotid  endarterectomy.  3. Cardiac stents.  4. LASIK on his eyes.    ALLERGIES: No known drug allergies.   SOCIAL HISTORY: Patient lives at home with his wife. He quit smoking a couple of years ago then started smoking again on and off and he states that for past three months he has not smoked. He smokes about 1 to 2 cigarettes a day when he was smoking last time. He does not drink. Denies any IV drugs. He is retired.   FAMILY HISTORY: Positive for diabetes in his mom who also has coronary artery disease. His  father had history of esophageal and stomach cancer.   CURRENT MEDICATIONS:  1. Zetia 5 mg once daily.  2. Vitamin C 1000 once daily.  3. Ventolin 2 puffs p.r.n.  4. THEo 24/200 mg extended release every 12 hours. 5. Symbicort 160 mg 2 puffs 2 times daily.  6. Spiriva 18 mcg once daily.  7. Prednisone steroid taper currently taking.  8. Os-Cal D 1 p.o. twice daily.  9. Omeprazole 20 mg once a day.  10. Nystatin oral for possible candidiasis, thrush. 11. Nitrostat p.r.n. sublingual. 12. Mucinex 600 mg twice daily.  13. Levaquin 500 mg p.o. given out. Patient has taken only one dose. 14. Isosorbide mononitrate 30 mg extended release. 15. Furosemide 20 mg once daily p.r.n., has not taken in a while.  16. Fish oil 1000 mg twice a day.  17. Eligard 45 mg injection every six months.  18. Docusate 100 mg twice daily. 19. Crestor 20 mg once daily. 20. Clonidine 0.1 mg twice daily.  21. Centrum Silver once daily.  22. Coreg 6.25 mg twice daily.  23. Aspirin 162 mg once a day.  24. Amlodipine 10 mg once a day. 25. Alprazolam 0.25 mg every six hours. 26. Albuterol nebulizers. 27. Tramadol with Tylenol p.r.n. pain.   PHYSICAL EXAMINATION:  VITAL SIGNS: Blood pressure 142/69, pulse 89, respiratory rate 20, temperature 98.2.   GENERAL: Patient is in general alert and oriented x3, not in acute distress. No respiratory distress at this moment. Hemodynamically stable   HEENT: Pupils are  equal and reactive. Normocephalic, atraumatic. Extraocular movements are intact. Mucosa are moist. No oral lesions. No oropharyngeal exudates.   NECK: Supple. No JVD. No thyromegaly. No adenopathy. No carotid bruit.   RESPIRATORY: Diffuse rhonchi and wheezing, mostly bibasilar. No use of accessory muscles. No dullness to percussion.   CARDIOVASCULAR: Regular rate and rhythm. Positive systolic ejection murmur 2/6. No S3 or S4. No lateralization of PMI. Not tender to palpation on the chest.   ABDOMEN: Soft, nontender, nondistended. No hepatosplenomegaly. Bowel sounds are positive.   EXTREMITIES: Trace edema +1, no clubbing or cyanosis.   MUSCULOSKELETAL: No joint deformity. No joint effusions.   NEUROLOGIC: Cranial nerves II through XII are grossly intact. No focal deficits.   SKIN: No rashes. Is warm, normal turgor.   LYMPHATICS: Negative for lymphadenopathy in neck or supraclavicular areas.   PSYCH: Mood is normal without any signs of agitation or depression.   LABORATORY, DIAGNOSTIC AND RADIOLOGICAL DATA: Blood sugar 108, BNP 1030, BUN 26, creatinine 1.06, sodium 141, potassium 4.1. Other electrolytes within normal limits. Troponin 0.02. White count 14.9, hemoglobin 12.4, platelets 174. Chest x-ray: No acute pulmonary disease. His wires from previous  CABG are evident. No effusions or consolidations.   ASSESSMENT AND PLAN:  1. Chronic obstructive pulmonary disease exacerbation. Patient is a 79 year old gentleman with chronic obstructive pulmonary disease, failure of outpatient treatment with Dr. Raul Del with Levaquin and oral steroids. Patient admitted with increased shortness of breath, hypoxemia with oxygen sats below 90. The patient has significant wheezing. At this moment he is not using any respiratory accessory muscles and he is more relaxed after respiratory treatments. We are going to give him Levaquin IV, steroids IV every 12 hours and nebulizers as scheduled. Continue pulmonary  toilet. Patient has oxygen at 3 liters nasal cannula.  2. Hypoxemia requiring oxygen. Continue oxygen supplementation with nasal cannula. 3. Obstructive sleep apnea. Patient uses only oxygen at home whenever he goes to sleep and not during the day, continue that.  4. History of congestive heart failure. Patient has history of diastolic dysfunction. At this moment does not seem to be on any exacerbation. His BNP is only 1000 and his chest x-ray doesn't show any significant pulmonary congestion or pulmonary edema. We are going to continue beta blocker. Continue aspirin. Patient is not on an ACE or an ARB but his systolic ejection fraction is about 55. Patient is no longer taking furosemide. He only uses p.r.n. whenever he has edema. 5. Coronary artery disease. Patient has CABG in the past, three vessels. He is taking aspirin. He is taking Coreg. He is taking isosorbide and nitroglycerin.   6. Deep vein thrombosis prophylaxis with heparin.  7. GI prophylaxis with Protonix.  8. His other problem of prostate cancer is stable. His hypertension seems to be stable as well. Will continue treatment with same medications. Hyperlipidemia, continue Zetia and Crestor.  9. Patient is a FULL CODE.   TIME SPENT: I spent 45 minutes with this admission.  ____________________________ [place] Sink, MD rsg:cms D: 07/03/2012 19:29:16 ET T: 07/04/2012 07:44:40 ET JOB#: [id number]  cc: [place] Sink, MD, <Dictator> Francee Nodal, MD Cristi Loron MD ELECTRONICALLY SIGNED 07/05/2012 20:28

## 2014-12-06 NOTE — Discharge Summary (Signed)
PATIENT NAME:  Javier [name], Javier [name] MR#:  [id number] DATE OF BIRTH:  [birth year]  DATE OF ADMISSION:  03/17/2012 DATE OF DISCHARGE:  03/20/2012  DISCHARGE DIAGNOSES:  1. Acute on chronic obstructive pulmonary disease exacerbation.  2. Pneumonia.  3. Chronic diastolic heart failure.  4. Coronary artery disease with bypass.  5. Dental pain.  6. History of prostate cancer.  7. Peripheral vascular disease.   DISCHARGE MEDICATIONS:  1. The patient can continue Symbicort that he takes at home 2 puffs b.i.d.  2. Clonidine 0.2 mg half tablet p.o. b.i.d.  3. Aspirin 81 mg daily, takes 2 tablets.  4. Zetia 5 mg daily.  5. Pantoprazole 20 mg daily. 6. Centrum Silver 1 tablet daily.  7. Vitamin C 1 tablet daily.  8. Mega Red 300 mg p.o. b.i.d.  9. Os-Cal with vitamin D two times a day.  10. Spiriva 18 micrograms inhalation daily.  11. Eligard for prostate cancer, takes 45 mg every six months.  12. Nitrostat sublingual as needed p.r.n. for chest pain.  13. Combivent nebulizer q.4-6 hours as needed.  14. Xanax 0.25 mg every six hours as needed for anxiety.  15. Levaquin 500 milligrams daily for 10 days for pneumonia.  16. Mucinex 600 mg p.o. b.i.d.  17. Colace 100 mg p.o. b.i.d.  18. Tramadol with acetaminophen combination 37.5/325, one to two times a day as needed.  19. Ventolin 2 puffs 4 times daily after he finishes nebulizer treatment.  20. Isosorbide mononitrate 30 mg 2 tablets once a day in the morning.  21. Coreg 6.25 mg p.o. b.i.d.  22. Norvasc 5 mg p.o. daily.  23. He can continue Lasix 20 mg fluid pill.  24. Crestor 20 mg daily.  25. Prednisone dose taper 60 mg daily for two days, 50 mg daily for two days, and taper it off to 10 mg daily for two days.  26. The patient can be on Lidocaine viscous jelly 5 mL 4 times a day for dental pain. 27. Robitussin 10 mL every four hours p.r.n. for cough.   CONSULTATIONS: None.   HOSPITAL COURSE: 79 year old male with history of chronic  obstructive pulmonary disease who came in because of trouble breathing and also coughing up blood. The patient was on Levaquin and prednisone given at [facility] for sinusitis. Because of not getting better, he came. The patient's CT of the chest showed chronic fibrotic changes with emphysema and multiple pulmonary nodules. The patient is admitted for the hospitalist service for acute on chronic respiratory failure secondary to chronic obstructive pulmonary disease. The patient 02 sats are 92% on room air. The patient continued on Solu-Medrol, Levaquin and nebulizers along with steroids. The patient actually got worse with more wheezing, so I repeated a chest x-ray which showed pneumonia. I increased the steroid, Solu-Medrol, to twice daily and he was getting prednisone 60 mg daily. I increased to 60 q.12 and he was continued on nebulizer and Levaquin. The patient's troponins have been negative. The patient did get better this morning and he is saturating 98% on room air and afebrile and he said he can go home and we are discharging him with tapering course of prednisone, Duonebs and also Levaquin to finish. He has an appointment with Dr. Raul Del that he can keep and follow-up with him. CT of the chest showed findings representing fibrosis, multiple indeterminate subcentimeter pulmonary nodules, around 6 mm and the patient is to have repeat CT of the chest. The patient discussed the findings. The patient can  have a repeat chest CT scan in six months. The patient's WBCs were elevated yesterday to 16.5 secondary to steroids and he has no fever, but it is down from 22.1 on admission.   The patient has some mild renal insufficiency when he came, BUN 25, creatinine 1.36. That is  improved and today it is 29 BUN and creatinine 1.21 and the patient's condition is stable.   Regarding his diastolic heart failure, he was on Lasix. He takes it as needed. He has no swelling in the legs. Regarding his blood pressure,  he can continue on his clonidine and blood pressure is slightly elevated this morning. But overall it is controlled, so he can continue his home medication.   For his chronic obstructive pulmonary disease, he can continue his Symbicort and also DuoNebs. For hypertension with history of coronary artery disease, he can continue the aspirin and Coreg and Crestor. The patient pulled out his teeth on his left lower side and says that it is hurting, but has no evidence of infection when I examined him, so he is on viscous lidocaine and he can continue that and advised him to follow-up with dentist.   TIME SPENT ON DISCHARGE PREPARATION: More than 30 minutes.   ____________________________ Epifanio Lesches, MD sk:ap D: 03/20/2012 13:15:29 ET T: 03/20/2012 14:32:01 ET JOB#: [id number]  cc: Epifanio Lesches, MD, <Dictator> Epifanio Lesches MD ELECTRONICALLY SIGNED 03/25/2012 20:25

## 2014-12-06 NOTE — H&P (Signed)
PATIENT NAME:  Javier [name], Javier [name] MR#:  [id number] DATE OF BIRTH:  02-03-1936  DATE OF ADMISSION:  04/27/2012  CARDIOLOGIST:  Dr. Rockey Situ PULMONOLOGIST:  Dr. Raul Del PRIMARY CARE PHYSICIAN:  Dr. Francee Nodal REFERRING ER PHYSICIAN:  Dr. Jasmine December  PRESENTING COMPLAINT: Shortness of breath and cough.   HISTORY OF PRESENT ILLNESS:  This is a 79 year old male with past medical history of chronic obstructive pulmonary disease, coronary artery bypass graft status post stent, status post bilateral carotid endarterectomy, hypertension, and hyperlipidemia. He had his regular appointment with his cardiologist last Friday. On that day he was feeling a little short of breath and the cardiologist found that he had some wheezing in the bilateral lower lobes so he prescribed him azithromycin. The patient took azithromycin for three days, but his shortness of breath and his cough were continuously worsening to the point that today he was feeling extremely short of breath and he was feeling weak and dizzy while getting up and trying to walk, so he decided to come to the Emergency Room. For his chronic obstructive pulmonary disease he uses nebulizers and inhalers at home. He tried albuterol nebulizer every four hours, but it was not relieving his symptoms and he was continuously wheezing, so he came to the emergency room. He denies any sputum production, any fever, or any sick contacts. Denies any swelling in the legs or any chest pain or palpitations. After coming to ER he received IV steroid and magnesium sulfate and after that he is feeling a little better now but still wheezing and so he needs to be admitted and the [facility] admitting team is called.  REVIEW OF SYSTEMS: CONSTITUTIONAL: Denies any fever or fatigue. He was feeling weak, but denies any pain or weight loss. EYES: Denies any blurring of vision, double vision, pain or redness or discharge from the eyes. ENT:  Denies any tinnitus, ear pain, hearing loss,  or any drainage from the ear. RESPIRATORY: He has a cough and wheezing, but denies any sputum or any blood in sputum. CARDIOVASCULAR: Denies any chest pain, orthopnea, denies any leg edema or palpitations. GASTROINTESTINAL: Denies any nausea, vomiting, diarrhea, abdominal pain, blood in the vomitus, or blood in the stool or change in bowel habits. GENITOURINARY: Denies any dysuria, hematuria, or increased frequency of urination. HEMATOLOGIC:  Denies any anemia, easy bruising or bleeding. SKIN: Denies any rash or any lesions on the skin. MUSCULOSKELETAL: Denies any pain or any swelling in joints. NEUROLOGICAL: Denies any numbness, weakness, loss of balance, or vertigo. PSYCHIATRIC: Denies any anxiety, insomnia, or depression.   PAST MEDICAL HISTORY:  1. Hypertension.  2. Hyperlipidemia.  3. Coronary artery disease status post coronary artery bypass graft and coronary stent.  4. Transient ischemic attack.  5. Bilateral carotid endarterectomy.  6. Chronic obstructive pulmonary disease.   PAST SURGICAL HISTORY:  1. Bilateral carotid endarterectomy.  2. Coronary artery bypass graft.  ALLERGIES: No known drug allergies.   SOCIAL HISTORY: Retired, lives with family, able to do his daily activities including driving, shopping, and all other routine activities. He was a regular smoker, smoking half a pack of cigarettes a day for almost 30 to 40 years, but for the last few years he cut it down to a cigarette once in a while. He denies any drinking. Denies illegal drug use.   FAMILY HISTORY: His mother and his son have diabetes.   HOME MEDICATIONS:  1. Acetaminophen/tramadol 325 mg 37.5 mg oral 1 tablet 1 to 2 times a day as needed for  joint pain.  2. Albuterol/ ipratropium 2.5 mg/0.5 mg inhalation solution, 3-mL nebulizer 4 times a day as needed for breathing. 3. Alprazolam 0.25 mg oral tablet orally every six hours as needed for anxiety.  4. Amlodipine 10 mg oral tablet once a day.  5. Aspirin 81 mg  enteric-coated once a day in the morning.  6. Carvedilol 6.25 mg oral tablet 2 times a day. 7. Centrum Silver oral tablet, 1 tablet once a day. 8. Clonidine 0.1 mg 2 times a day. 9. Crestor 20 mg once a day. 10. Dextromethorphan/ guaifenesin 10 mg/100 mg  per 5 mL,  take 10 mL orally every four hours as needed for cough.  11. Docusate sodium 100-mg oral tablet 2 times a day. 12. Fish oil 1000 mg orally 2 times a day.  13. Furosemide 20 mg oral once a day as needed for increased weight. 14. Isosorbide mononitrate 30 mg oral tablet once a day.  15. Mucinex 600 mg extended release 2 times a day as needed for congestion.  16. Nitrostat 0.4 mg sublingual tablet every five minutes as needed for chest pain, maximum 3 tablets. 17. Nystatin 100,000 units per mL oral suspension, 5 mL orally 2 times a day.  18. Omeprazole 20-mg delayed release tablet, 1 tablet orally once a day. 19. Os-Cal 500 + D, 1 tablet 2 times a day.  20. Prednisone 5-mg tablet once a day in the morning as needed for inflammation.  21. Spiriva 18-mcg inhalation capsule, 1 capsule via inhaler once a day.  22. Symbicort 160 mcg/4.5 mcg, 2 puffs 2 times a day. 23. Theophylline extended release 200-mg oral capsule every 12 hours. 24. Ventolin HFA 90-mcg inhaled aerosol, 2 puffs 4 times a day as needed for shortness of breath. 25. Vitamin C 1000 mg oral, 1 tablet orally once a day. 26. Zetia 10-mg oral tablet, 0.5 tablet orally once a day.  PHYSICAL EXAMINATION: VITAL SIGNS:  Temperature 98.6, pulse 94, respirations 20, blood pressure 147/73, pulse oximetry 95% on room air.  GENERAL:  On examination he is well developed and well nourished and in no acute distress.  HEAD AND NECK: Bilateral conjunctivae pink. Oral mucosa moist. No signs of trauma. No neck rigidity.  RESPIRATORY: Bilateral air entry equal. Expiratory wheezing present bilaterally.   CARDIOVASCULAR:  S1 and S2 present, regular. Systolic murmur present. Scar from  cardiac surgery on his chest.  ABDOMEN: Soft, nondistended,  obese. Bowel sounds present. Nontender.   SKIN: No rash. Skin turgor normal.   EXTREMITIES: Lower extremities no edema.   PSYCH: Fully alert and oriented and cooperative with examination. Good insight.  NEUROLOGICAL: Cranial nerves grossly intact. No gross motor or sensory deficit.   MUSCULOSKELETAL: No joint swelling or tenderness present.   LABORATORY, DIAGNOSTIC, AND RADIOLOGICAL DATA:  Lab results, x-ray, and EKG reviewed, showing mild rise in total WBC, negative urinalysis, and normal sinus rhythm on EKG.  ASSESSMENT: 79 year old male with a past medical history of chronic obstructive pulmonary disease, coronary artery bypass graft and stent, transient ischemic attack, bilateral carotid endarterectomy, hypertension, and hyperlipidemia presented with worsening cough and shortness of breath associated with wheezing and weakness. He already received azithromycin full course, but no benefit.   PLAN:  1. Chronic obstructive pulmonary disease exacerbation: Admit to the regular floor. IV steroids and nebulized bronchodilators, IV levofloxacin. Pulmonary consult called.  I spoke to Dr. Raul Del. He will continue his home medication theophylline and Spiriva.  2. Coronary artery bypass graft status post stent: Currently there are no  clinical signs of heart failure. We will continue all his cardiac medications.  3. Hypertension: Stable. We will continue his home medications.  4. Hyperlipidemia: We will continue his home medication.  5. Current smoker: Explained to him about the importance of quitting smoking habit. He agrees. Total time spent was five minutes.  6. GI prophylaxis with pantoprazole.  7. CODE STATUS: FULL CODE.  TOTAL TIME SPENT: 50 minutes.   ____________________________ Ceasar Lund Anselm Jungling, MD vgv:bjt D: 04/27/2012 19:09:28 ET T: 04/28/2012 06:17:10 ET JOB#: [id number]  cc: Ceasar Lund. Anselm Jungling, MD,  <Dictator> Francee Nodal, MD Vaughan Basta MD ELECTRONICALLY SIGNED 05/10/2012 17:55

## 2014-12-06 NOTE — Discharge Summary (Signed)
PATIENT NAME:  Javier [name], Javier [name] MR#:  [id number] DATE OF BIRTH:  [birth year]  DATE OF ADMISSION:  07/03/2012 DATE OF DISCHARGE:  07/07/2012  PRESENTING COMPLAINT: Shortness of breath, wheezing, and cough.   DISCHARGE DIAGNOSES:  1. Acute on chronic hypoxic respiratory failure.  2. Chronic obstructive pulmonary disease flare.  3. Chronic respiratory failure, on home oxygen.  4. Hypertension.  5. Coronary artery disease.   CONDITION ON DISCHARGE: Fair.   MEDICATIONS:  1. Symbicort 2 puffs twice a day. 2. Alprazolam 0.25 mg 1 tablet every six hours as needed for anxiety.  3. Zetia 5 mg p.o. daily.  4. Centrum Silver 1 tablet daily.  5. Vitamin C 1000 mg 1 tablet daily.  6. Os-Cal with Vitamin D 1 tablet b.i.d.  7. Spiriva 18 mcg inhalation daily.  8. Eligard 45 mg one injection sub-Q every six months.  9. Nitroglycerin sublingual 0.4 mg as needed.  10. Coreg 6.25 b.i.d.  11. Aspirin 81 mg 2 tablets daily.  12. Fish Oil 1000 mg 1 capsule b.i.d.  13. Guaifenesin/dextromethorphan one teaspoonful 5 mL every four hours as needed.  14. Singulair 10 mg daily.  15. Acetaminophen/hydrocodone 5/325 one tablet as needed for severe pain from cough.  16. Nicotrol inhaler 10 mg inhalation 1 inhalation every two hours as needed.  17. Clonidine 0.1 mg b.i.d.  18. Amlodipine 5 mg daily.  19. Crestor 20 mg p.o. in the evening.  20. Docusate 100 mg b.i.d.  21. Ventolin HFA 2 puffs every four hours as needed for shortness of breath.  22. Albuterol nebulizer p.r.n.  23. Lasix 20 mg p.o. daily.  24. Acetaminophen/tramadol 325/37.5 one tablet 1 to 2 times a day as needed for joint pain.  25. Mucinex 600 mg extended-release b.i.d.  26. Imdur 30 mg extended-release p.o. daily.  27. Omeprazole 20 mg daily.  28. Nystatin 5 mL twice a day.  29. Complete steroid taper as instructed.  30. Complete Levaquin prescription.   OXYGEN: 2 liters nasal cannula at night and p.r.n. during daytime.   FOLLOW-UP:  Follow-up with Dr. Raul Del 08/14/2012.   LABS AT DISCHARGE: White count is 16.3, hemoglobin and hematocrit 12.5 and 35.5, platelet count 174. Chemistries within normal limits. Chest x-ray consistent with COPD. No acute disease of the chest otherwise. Cardiac enzymes x3 negative. B-type natriuretic peptide 1030.   EKG shows normal sinus rhythm.   HOSPITAL COURSE: Mr. Gaffey 79 year old Caucasian gentleman who is well known to our service from previous many admissions who came in with:  1. Acute on chronic COPD exacerbation. The patient was continued on his Levaquin. He was started initially on IV steroids, nebulizer around-the-clock along with inhalers and was weaned down to his p.o. steroid taper.  2. Hypoxemia requiring oxygen during daytime. Continue oxygen supplementation with nasal cannula.  3. Obstructive sleep apnea. The patient uses oxygen at night and was continued on that.  4. History of congestive heart failure. The patient has history of diastolic dysfunction. At this moment did not seem to be in exacerbation. His Lasix and beta-blockers were continued. The patient does not take Lasix anymore on a daily basis; takes it on a p.r.n. basis.  5. Coronary artery disease status post coronary artery bypass graft, on aspirin, Coreg, isosorbide, and nitroglycerin.  6. DVT prophylaxis. Heparin was given.  7. GI prophylaxis with Protonix.  8. Hyperlipidemia. Zetia and Crestor were continued.   Hospital stay otherwise remained stable.   CODE STATUS: The patient remained a FULL CODE.   TIME  SPENT: 40 minutes.   ____________________________ Hart Rochester Posey Pronto, MD sap:drc D: 07/09/2012 17:18:42 ET T: 07/10/2012 12:23:27 ET JOB#: [id number]  cc: Cheresa Siers A. Posey Pronto, MD, <Dictator> Herbon E. Raul Del, MD Ilda Basset MD ELECTRONICALLY SIGNED 07/12/2012 13:40

## 2014-12-06 NOTE — Discharge Summary (Signed)
PATIENT NAME:  Javier [name], Javier [name] MR#:  [id number] DATE OF BIRTH:  August 24, 1935  DATE OF ADMISSION:  03/24/2012 DATE OF DISCHARGE:  03/27/2012  CONSULTANT: Dr. Raul Del from Pulmonary    CHIEF COMPLAINT: Shortness of breath.   DISCHARGE DIAGNOSES:  1. Acute chronic obstructive pulmonary disease exacerbation.  2. Leukocytosis possibly from steroid use.   3. Thrush.  4. Coronary artery disease.  5. Anxiety.  6. Hypertension.   SECONDARY DIAGNOSES:  1. History of recent hospitalization here for COPD exacerbation from 03/17/2012 through 03/20/2012.  2. History of pneumonia. 3. History of peripheral vascular disease.  4. History of coronary artery disease. 5. History of obstructive sleep apnea, not on CPAP. 6. History of hyperlipidemia. 7. History of CABG. 8. History of coronary artery disease status post stent placement. 9. History of carotid endarterectomy.   10. History of multiple pulmonary nodules being followed as an outpatient.   DISCHARGE MEDICATIONS:  1. Symbicort 160/4.5 mcg inhaled 2 puffs 2 times a day.  2. Zetia 5 mg daily.  3. Pantoprazole 20 mg daily.  4. Centrum Silver 1 tab daily.  5. Vitamin C 1000 mg daily.  6. Os Cal with D 500 mg 1 tab 2 times a day.  7. Spiriva 18 mcg one cap inhaled once a day. 8. Eligard 45 mg every six month subcutaneous injection. 9. Nitrostat 0.4 mg sublingual every five minutes as needed for chest pain.  10. Alprazolam 0.25 mg every six hours as needed for anxiety.  11. Isosorbide mononitrate 30 mg extended-release 2 tabs once a day. 12. Dextromethorphan/guaifenesin 10/100 mg/5 mL 10 mL every four hours as needed for cough.  13. Coreg 6.25 mg 2 times a day.  14. Clonidine 0.2 mg half a tab two times a day.  15. Amlodipine 10 mg half a tab daily.  16. Crestor 20 mg daily.  17. Fish Oil 1000 mg one cap daily.  18. Docusate sodium 1 tab 2 times a day.  19. Ventolin HFA 90 mcg inhaled 2 puffs 4 times a day as needed for shortness of breath.   20. Albuterol/ipratropium nebs 3 mL via nebulizer 4 times daily as needed for breathing. 21. Lasix 20 mg 1 tab once a day as needed. 22. Acetaminophen/tramadol 325/37.5 mg 1 tab 1 to 2 times a day as needed for joint pain.  23. Mucinex 600 mg extended-release 1 tab 2 times a day as needed for congestion.  24. Prednisone taper 60 mg daily for two days, 50 mg daily for two days, 40 mg daily for two days, 30 mg daily for two days, 20 mg daily for two days, 10 mg daily for two days, then stop.  25. Aspirin 81 mg daily.  26. Nystatin swish and swallow 5 mL every six hours for six days. 27. Theophylline 200 mg extended-release 1 tab 2 times a day.   DIET: Low sodium.   ACTIVITY: As tolerated.   FOLLOW-UP:  1. Please follow-up with Dr. Raul Del in 1 to 2 weeks. 2. Please follow-up with your PCP within 1 to 2 weeks. 3. Repeat a CT of the chest for the pulmonary nodules.  4. Repeat a CBC and a BMP within one week.   CODE STATUS: The patient is a FULL CODE.   HISTORY OF PRESENT ILLNESS: For full details of history and physical, please see the dictation on 03/22/2012 by Dr. Stevenson Clinch. Briefly this is a 79 year old male with history of COPD, OSA, hypertension, and CAD who presented with shortness of breath, wheezing, and  noted to have hypoxia of 91% on room air. He's been having increased wheezing since previous discharge several days prior to admission. He was admitted to the hospitalist service for further evaluation and management.   SIGNIFICANT LABS AND IMAGING: Initial BUN 29, creatinine 1.21. BNP 599. LFTs on arrival albumin 3.1, otherwise within normal limits. Troponin negative x3. CK-MB within normal x3. Initial WBC 16.5, by discharge WBC 18.3. Hemoglobin 11.8, hematocrit 34.6. Initial pH venous was 7.47, pCO2 of 40.   CT chest, PE protocol, showing no PE. There were multiple indeterminate pulmonary nodules, the largest of which is 6 mm. Submucous plugging as well.   X-ray of chest, portable,  showing findings of pulmonary venous hypertension with mild interstitial pulmonary edema.   HOSPITAL COURSE: The patient was admitted to the hospitalist service, started on his outpatient medications of nebulizers and inhalers and started on IV steroids. The patient was also started on Levaquin and also started on oxygen on arrival. He was weaned off of the oxygen. Pulmonary was consulted and theophylline was added. The patient was weaned off of the oxygen and has been having adequate saturations of oxygen without oxygen supplementation. The patient has a persistent leukocytosis likely secondary to steroid use. The patient did develop thrush here and has been on nystatin. His blood pressure was labile and on the higher side and his outpatient medications were slowly resumed. CT of the chest for PE protocol was performed which did not show any PE. At this point he has improvement in his wheezing and cough and will be discharged with outpatient follow-up. He will be discharged on a prednisone taper. He has been evaluated and seen by Dr. Raul Del while here.   TOTAL TIME SPENT: 35 minutes.   CODE STATUS: The patient is FULL CODE.   ____________________________ Vivien Presto, MD sa:drc D: 03/27/2012 14:29:15 ET T: 03/27/2012 16:36:56 ET JOB#: [id number]  cc: Vivien Presto, MD, <Dictator>, Herbon E. Raul Del, MD, [place] MD ELECTRONICALLY SIGNED 04/10/2012 15:43

## 2014-12-06 NOTE — Discharge Summary (Signed)
PATIENT NAME:  Javier [name], Javier [name] MR#:  [id number] DATE OF BIRTH:  Dec 02, 1935  DATE OF ADMISSION:  04/27/2012 DATE OF DISCHARGE:  04/29/2012  DIAGNOSES:  1. Acute chronic obstructive pulmonary disease exacerbation and acute bronchitis.  2. Coronary artery disease status post coronary artery bypass graft. 3. Hypertension. 4. Hyperlipidemia. 5. Ongoing smoking.  6. Transient ischemic attack. 7. History of bilateral carotid endarterectomy.    DISPOSITION: Patient is being discharged home.   FOLLOW UP: Follow up with Dr. Raul Del and Dr. Mila Palmer in 1 to 2 weeks after discharge.   DIET: Low sodium.   ACTIVITY: As tolerated.   DISCHARGE MEDICATIONS:  1. Symbicort 2 puffs b.i.d.  2. Zetia 5 mg daily. 3. Centrum multivitamin 1 tablet once a day. 4. Vitamin C 1000 mg once a day.  5. Calcium with vitamin D 1 tablet b.i.d.  6. Spiriva 18 mcg inhaled daily.  7. Eligard 45 mg 1 injection q.6 months. 8. Nitroglycerin p.r.n.  9. Xanax 0.25 mg every six hours p.r.n.  10. Robitussin 10 mL q.4 hours p.r.n. 11. Coreg 6.25 mg b.i.d.  12. Clonidine 0.2 mg 0.5 tablets b.i.d.  13. Amlodipine 10 mg daily.  14. Crestor 20 mg daily.  15. Colace 100 mg b.i.d.   16. Ventolin HFA 2 puffs q.i.d. as needed.  17. DuoNebs q.i.d. as needed for shortness of breath.  18. Lasix 20 mg daily as needed. 19. Tylenol/Tramadol 325/37.5, 1 tablet 1 to 2 times a day as needed for pain.  20. Mucinex 600 mg b.i.d. as needed.  21. Aspirin 162 mg once a day. 22. Imdur 30 mg daily.  23. Omeprazole 20 mg daily.  24. Fish oil 1000 mg b.i.d.  25. Theophylline 200 mg 1 capsule b.i.d.  26. Nystatin suspension 5 mL b.i.d.  27. Prednisone 5 mg in the morning as needed for inflammation. 28. Prednisone taper.  29. Levaquin 500 mg daily.   LABORATORY, DIAGNOSTIC AND RADIOLOGICAL DATA: Chest x-ray showed no acute abnormality, no infiltrate. White count was normal 11.3, normal by the time of discharge. Hemoglobin 13.2,  platelet count 262, glucose 92, BNP 274. Rest of comprehensive metabolic panel normal. Cardiac enzymes normal.   HOSPITAL COURSE: Patient is a 79 year old male with past medical history of chronic obstructive pulmonary disease, hypertension, coronary artery disease, status post coronary artery bypass graft, ongoing smoking who presented with shortness of breath. He was treated with Zithromax as an outpatient without any response. He also had a recent upper respiratory infection. He was started on nebulizer treatments, Spiriva, theophylline, steroids and Symbicort. Chest x-ray was done which showed no infiltrate. Due to his recent upper respiratory infection and chronic obstructive pulmonary disease/bronchitis symptoms he was also treated with antibiotics. He was counseled about his ongoing smoking. By the time of discharge he was no longer wheezing. His vital signs were stable and he was on room air.   TIME SPENT: 45 minutes.   ____________________________ Cherre Huger, MD sp:cms D: 04/29/2012 15:33:15 ET T: 04/30/2012 14:38:33 ET  JOB#: [id number] cc: Cherre Huger, MD, <Dictator> Cherre Huger MD ELECTRONICALLY SIGNED 04/30/2012 16:00

## 2014-12-09 ENCOUNTER — Telehealth: Payer: Self-pay | Admitting: *Deleted

## 2014-12-09 NOTE — H&P (Signed)
PATIENT NAME:  Javier [name], Javier [name] MR#:  [id number] DATE OF BIRTH:  29-Mar-1936  DATE OF ADMISSION:  08/15/2013  REFERRING PHYSICIAN:  Dr. Karma Greaser.   FAMILY PHYSICIAN:  Dr. Casimer Lanius.  CARDIOLOGIST:  Dr. Rockey Situ.   REASON FOR ADMISSION:  Left-sided weakness, worrisome for transient ischemic attack.   HISTORY OF PRESENT ILLNESS:  The patient is a 79 year old male with a history of multiple medical problems including coronary artery disease status post CABG, COPD, chronic A-Fib on Coumadin and previous stroke presents to the Emergency Room with transient left upper extremity weakness and numbness.  Symptoms lasted approximately 20 minutes and started to improve.  Continues to have some minimal left hand weakness, but his symptoms are resolving.  His Coumadin is therapeutic in the Emergency Room.  His head CT showed no acute changes.  He is now admitted for further evaluation.   PAST MEDICAL HISTORY: 1.  ASCVD status post CABG.  2.  Status post left carotid endarterectomy.  3.  Peripheral vascular disease.  4.  COPD.  5.  Chronic A-Fib on anticoagulation.  6.  Previous stroke.  7.  GE reflux disease.  8.  Chronic diastolic congestive heart failure.  9.  History of atypical chest pain.   MEDICATIONS: 1.  Symbicort 160/4.5 2 puffs twice daily.  2.  Iron sulfate 325 mg by mouth twice daily.  3.  Singulair 10 mg by mouth at bedtime.  4.  Ventolin 2 puffs q. 4 hours as needed shortness of breath.  5.  Roxanol 20 mg/mL 0.25 mL by mouth q. 4 hours as needed pain or dyspnea.  6.  Nitrostat as needed chest pain.  7.  Prilosec 20 mg by mouth daily.  8.  Florinef 0.1 mg by mouth daily.  9.  Zoloft 50 mg by mouth daily.  10.  Crestor 20 mg by mouth q. p.m.  11.  Colace 100 mg by mouth twice daily.  12.  Spiriva 1 capsule inhaled daily.  13.  Lasix 20 mg by mouth daily.  14.  DuoNeb SVNs four times daily as needed.  15.  Aspirin 81 mg by mouth daily.  16.  Klor-Con 10 mEq by mouth daily.  17.   Coumadin 3 mg every Monday, Wednesday, Friday, Sunday and 1.5 mg on Tuesday, Thursday, Saturday.  18.  Lopressor 50 mg by mouth twice daily.  19.  Xanax 0.25 mg by mouth q. 6 hours as needed anxiety.   ALLERGIES:  PREDNISONE.   SOCIAL HISTORY:  The patient had a history of tobacco abuse, but states he quit smoking a year ago.  No history of alcohol abuse.  He is retired.   FAMILY HISTORY:  Positive for GI cancer, gout, coronary artery disease and diabetes.   REVIEW OF SYSTEMS:  CONSTITUTIONAL:  No fever or change in weight.  EYES:  No blurred or double vision.  No glaucoma.  EARS, NOSE, THROAT:  No tinnitus or hearing loss.  No nasal discharge or bleeding.  No difficulty swallowing.  RESPIRATORY:  No cough or wheezing.  Denies hemoptysis.  CARDIOVASCULAR:  No chest pain or orthopnea.  No palpitations.  GASTROINTESTINAL:  No nausea, vomiting, or diarrhea.  No abdominal pain.  No change in bowel habits.  GENITOURINARY:  No dysuria or hematuria.  No incontinence.  ENDOCRINE:  No polyuria or polydipsia.  No heat or cold intolerance.  HEMATOLOGIC:  The patient denies anemia, easy bruising, or bleeding.  LYMPHATIC:  No swollen glands.  MUSCULOSKELETAL:  The patient denies pain  in his neck, back, shoulders, knees or hips.  No gout.  NEUROLOGIC:  No migraines.  Denies seizures.  PSYCHIATRIC:  The patient denies anxiety, insomnia or depression.   PHYSICAL EXAMINATION: GENERAL:  The patient is in no acute distress.  VITAL SIGNS:  Currently remarkable for a blood pressure of 190/110, with a heart rate of 88, respiratory rate of 20, temperature of 99.3.  HEENT:  Normocephalic, atraumatic.  Pupils equally round and reactive to light and accommodation.  Extraocular movements are intact.  Sclerae are not icteric.  Conjunctivae are clear.  Oropharynx is clear.  NECK:  Supple without JVD.  Soft bilateral carotid bruits are noted.  No adenopathy is present.  LUNGS:  Clear to auscultation and percussion  without wheezes, rales or rhonchi.  No dullness.  Respiratory effort is normal.  CARDIAC:  Irregularly irregular rhythm.  No significant rubs or gallops.  PMI is nondisplaced.  Chest wall is nontender.  ABDOMEN:  Soft, nontender with normoactive bowel sounds.  No organomegaly or masses were appreciated.  No hernias or bruits were noted.  EXTREMITIES:  Without clubbing, cyanosis, edema.  Pulses were 2+ bilaterally.  SKIN:  Warm and dry without rash or lesions.  NEUROLOGIC:  Cranial nerves II through XII grossly intact.  Deep tendon reflexes were symmetric.  Motor exam revealed 4 out of 5 strength on the left with 5 out of 5 strength on the right.  Sensory exam was intact.  PSYCHIATRIC:  Revealed a patient who is alert and oriented to person, place and time.  He was cooperative and used good judgment.   LABORATORY DATA:  EKG revealed atrial fibrillation at 84 beats per minute with no acute ischemic changes.  Chest x-ray was remarkable for some chronic vascular congestion/pulmonary edema.  Head CT revealed no acute intracranial abnormality.  Urinalysis was unremarkable.  Troponin was less than 0.02.  Pro Time was 28.4 with an INR of 2.8.  Magnesium 1.7.  Glucose 103 with a BUN of 21, creatinine 1.48 with a sodium of 143 and a potassium of 3.9 with a GFR of 46.  White count 12.3 with a hemoglobin of 12.4.   ASSESSMENT: 1.  Left right-sided weakness worrisome for recurrent transient ischemic attacks despite Coumadin.  2.  Peripheral vascular disease.  3.  Atherosclerotic cardiovascular disease status post coronary artery bypass graft.  4.  Chronic atrial fibrillation on anticoagulation.  5.  Anemia of chronic disease.  6.  Chronic renal insufficiency.  7.  Hypomagnesemia.  8.  Accelerated hypertension.   PLAN:  The patient will be observed on telemetry.  We will continue aspirin and Coumadin.  Neuro checks and vital signs q. 4 hours.  We will obtain MRI of the brain and carotid Dopplers.  We will  optimize his blood pressure regimen at this time.  We will supplement magnesium and follow up labs in the morning.  Further treatment and evaluation will depend upon the patient's progress.   Total time spent on this patient was 50 minutes.     ____________________________ Leonie Douglas Doy Hutching, MD jds:ea D: 08/15/2013 00:28:00 ET T: 08/15/2013 02:33:24 ET JOB#: [id number]  cc: Leonie Douglas. Doy Hutching, MD, <Dictator> Francee Nodal, MD JEFFREY Lennice Sites MD ELECTRONICALLY SIGNED 08/16/2013 8:16

## 2014-12-09 NOTE — Consult Note (Signed)
General Aspect 79 year old male with h/o smoking 40 years, frequent admissions for severe COPD exacerbations/bronchitis, chronic respiratory failure, CAD s/p CABG in 2007, occlusion of 2 VGs with PCI x4, persistent AF/flutter, HFpEF, PVD with bilateral carotid endarterectomies in 2002, history of TIA in 2002, h/o prostate CA s/p brachytherapy, h/o GIB/hematuria and anemia who presents back to [facility] with chest pain.   He has been admitted multiple times for for chest pain, A/C diastolic CHF, A/COPD and syncope- orthostatic/hypovolemia in 7-03/2013. He was seen again at [facility] 04/19/13 for left-sided hemiparesis, slurred speech and weakness. tPa was administered and he was transferred to [facility]. MRI revealed no evidence of infarct and he had near full neurological recovery. Cardioembolic source was suspected. Anticoagulation had been held previously d/t hematuria. He was started on full-dose ASA and discharged. He followed up with Dr. Rockey Situ. Coumadin started after a long discussion, ASA held, goal INR 1.8-2.2 to minimize bleeding risk.   Hospitalization in May 2013 chest pain, COPD exacerbation. Stress test showed no ischemia.  Echocardiogram 11/18/12:  EF 55-60%, rhythm atrial flutter, mild LA dilatation, mildly dilated LA, mild TR/MR, mildly elevated PASP, diastolic dysfxn, rhythm noted to be atrial flutter. On prior echos, severe asymmetric LVH with moderate LVOT gradient, mitral SAM identified.  He awoke at 5 AM with left sided chest pain 8/10 worse with inspiration and coughing radiating to his left arm with associated clamminess lasting for several hours and unrelievd by NTG. He received morphine sulfate en route by EMS with improvement to 6/10. No exertional component. Denies worsening DOE/SOB, PND, orthopnea, abdominal distention, weight gain, syncope or palpitations.   Present Illness In the ED, EKG revealed rate-controlled atrial fibrillation. Initial TnI WNL. Cardiology has been consulted for  chest pain. CMP- BUN 20/ Cr 1.44, alb 3.0. INR 2.1. U/a w/o evidence of UTI or hematuria.   Prior Veva Holes done in the hospital 08/2011 indicated mild ischemia, basal-mid lateral wall, old infarct mild peri-infarct ischemia, EF 72%.   PAST SURGICAL HISTORY: CABG, carotid endarterectomy, cardiac stent, Lasik surgery.    SOCIAL HISTORY: He quit smoking 2 years ago. Denies any alcohol drinking or illicit drug use.   FAMILY HISTORY: Significant for diabetes and coronary artery disease. Father with esophageal CA.   Physical Exam:  GEN obese   HEENT pink conjunctivae, PERRL, hearing intact to voice   NECK supple  trachea midline  JVP 6-7 cm   RESP normal resp effort  clear BS  no use of accessory muscles   CARD Irregular rate and rhythm  Normal, S1, S2  No murmur   ABD denies tenderness  soft  normal BS   EXTR negative cyanosis/clubbing, negative edema   SKIN normal to palpation   NEURO follows commands, 4/5 LUE/LLE strength to resisted flexion/extension, hand grip   PSYCH alert, A+O to time, place, person   Review of Systems:  Subjective/Chief Complaint chest pain   Musculoskeletal: chest wall pain   Neurologic: mild left sided weakness   Review of Systems: All other systems were reviewed and found to be negative   Home Medications: Medication Instructions Status  Symbicort 160 mcg-4.5 mcg/inh inhalation aerosol 2 puff(s) inhaled 2 times a day Active  Eligard 45 mg/6 months subcutaneous injection, extended release 45 milligram(s) subcutaneous every 6 months on the first of April and October Active  ferrous sulfate 325 mg oral tablet 1 tab(s) orally 2 times a day Active  topiramate 25 mg oral tablet 1 tab(s) orally 2 times a day Active  codeine-guaiFENesin 10 mg-100 mg/5 mL oral syrup 10 milliliter(s) orally every 4 hours, As Needed for pain from coughing Active  Nitrostat 0.4 mg sublingual tablet 1 tab(s) sublingual every 5 minutes, As Needed - for Chest Pain  Active  PriLOSEC 20 mg oral delayed release tablet 1 tab(s) orally once a day (in the morning) Active  fludrocortisone 0.1 mg oral tablet 1 tab(s) orally once a day (in the morning) Active  Senna 8.6 mg oral tablet 1 tab(s) orally once a day (in the morning) Active  Zoloft 50 mg oral tablet 1 tab(s) orally once a day (in the morning) Active  Vitamin C 1000 mg oral tablet 1 tab(s) orally once a day (in the morning) Active  Crestor 20 mg oral tablet 1 tab(s) orally once a day (in the evening) Active  warfarin 3 mg oral tablet 1 tab(s) orally once a day (in the evening) Active  Fish Oil 1000 mg oral capsule 1 cap(s) orally 2 times a day Active  docusate sodium 100 mg oral tablet 1 tab(s) orally 2 times a day Active  Spiriva 18 mcg inhalation capsule 1 each inhaled once a day (in the morning) Active  furosemide 20 mg oral tablet 1 tab(s) orally once a day (in the morning), As Needed for swelling Active  Mucinex 600 mg oral tablet, extended release 1 tab(s) orally 2 times a day, As Needed for congestion Active  albuterol-ipratropium 2.5 mg-0.5 mg/3 mL inhalation solution 3 milliliter(s) inhaled 4 times a day, As Needed - for Shortness of Breath Active  Robitussin DM To Go 20 mg-200 mg/10 mL oral liquid 10 milliliter(s) orally every 4 hours, As Needed for cough Active  acetaminophen-traMADol 325 mg-37.5 mg oral tablet 1 tab(s) orally 1 to 2 times a day, As Needed for joint pain Active  nystatin 100000 units/mL oral suspension 5 milliliter(s) orally 2 times a day Active  montelukast 10 mg oral tablet 1 tab(s) orally once a day (at bedtime)  Active  ALPRAZolam 0.25 mg oral tablet 1 tab(s) orally every 6 hours, As Needed for anxiety Active  Ventolin HFA 90 mcg/inh inhalation aerosol 2 puff(s) inhaled every 4 hours, As Needed - for Shortness of Breath Active  morphine 20 mg/mL oral concentrate 0.25 milliliter(s) orally every 4 hours, As Needed - for Pain Active  acetaminophen-HYDROcodone 325 mg-5 mg oral  tablet 1 tab(s) orally every 4 hours, As Needed - for severe Pain from coughing Active   Lab Results:  Routine Chem:  18-Sep-14 06:12   BUN  20  Creatinine (comp)  1.44  Cardiac:  18-Sep-14 06:12   Troponin I < 0.02 (0.00-0.05 0.05 ng/mL or less: NEGATIVE  Repeat testing in 3-6 hrs  if clinically indicated. >0.05 ng/mL: POTENTIAL  MYOCARDIAL INJURY. Repeat  testing in 3-6 hrs if  clinically indicated. NOTE: An increase or decrease  of 30% or more on serial  testing suggests a  clinically important change)    14:18   Troponin I < 0.02 (0.00-0.05 0.05 ng/mL or less: NEGATIVE  Repeat testing in 3-6 hrs  if clinically indicated. >0.05 ng/mL: POTENTIAL  MYOCARDIAL INJURY. Repeat  testing in 3-6 hrs if  clinically indicated. NOTE: An increase or decrease  of 30% or more on serial  testing suggests a  clinically important change)   EKG:  Interpretation atrial fibrillation, no ST/T changes, Q wave III, aVF   EKG Comparision Not changed from  02/2013 tracing    Prednisone: Unknown  Vital Signs/Nurse's Notes: **Vital Signs.:   18-Sep-14  13:44  Vital Signs Type Admission  Temperature Temperature (F) 98  Celsius 36.6  Temperature Source oral  Pulse Pulse 86  Respirations Respirations 18  Systolic BP Systolic BP 425  Diastolic BP (mmHg) Diastolic BP (mmHg) 64  Mean BP 83  Pulse Ox % Pulse Ox % 99  Pulse Ox Activity Level  At rest  Oxygen Delivery 2L    Impression 79 year old male with h/o smoking 40 years, frequent admissions for severe COPD exacerbations/bronchitis, chronic respiratory failure, CAD s/p CABG in 2007, occlusion of 2 VGs with PCI x4, persistent AF/flutter, HFpEF, PVD with bilateral carotid endarterectomies in 2002, history of TIA in 2002, h/o prostate CA s/p brachytherapy, h/o GIB/hematuria and anemia who presents back to [facility] with chest pain.   1. Chest wall pain The patient described chest discomfort aggravated by inspiration and coughing for several  hours. Still not completely resolved. On exam, he endorses discomfort with chest wall palpation. EKG reveals no ischemic changes. TnI x 2 WNL.  -- Obtain one more set of TnI for formal rule out -- Supportive care for costochondritis -- Would avoid NSAIDs given CKD and h/o GIB. Add Ultram.   2. H/o syncope/orthostatic hypotension No further episodes of syncope/presyncope.  -- Continue compression stockings  3. Chronic diastolic CHF JVP up a bit on exam. Fine bibasilar rales. EF 55-60% in April of this year. Euvolemic weight ~ 230 lbs. 226-235 lbs today.  -- Continue Lasix PO. He is sensitive to hypovolemia/dehydration. Avoid A/CKD as well. -- Salt/fluid restriction while inpatient.   4. Persistent atrial fibrillation Rate-controlled. Recent cardioembolic CVA. Coumadin restarted. No abnormal bleeding. U/a w/o evidence of hematuria. INR 2.1 today. Goal 1.8-2.2.  -- Will adjust Coumadin to recent regimen- 3mg  daily, except 1.5mg  Mon, Fri.  -- Rate-controlled off AVN blockers. Continue to monitor on telemetry.   4 .CAD s/p CABG, multiple PCIs EKG w/o ischemic changes. Trop-I WNL x 2.  -- Would start full dose ASA given h/o CABG. Will need to monitor bleeding with concomitant Coumadin use.   5. Recent R-sided CVA Minimal left-sided deficits. Suspected to be cardioembolic while off anticoagulation for a-fib due to h/o hematuria.  -- Continue Coumadin. Goal INR 1.8-2.2 as above.  6. CKD, stage III Stable. Per primary team.   Plan 7. COPD Stable. Per primary team   Electronic Signatures for Addendum Section:  Kathlyn Sacramento (MD) (Signed Addendum 18-Sep-14 16:50)  The patient was seen and examined. Agree with the above. He is well known to [place]. Preesnted with severe prolonged chest pain nonresponsive to NTG but improved with Morphine. By exam , he has reproducible tenderness. ECG without ischemic changes and CEs are negative. NO further cardiac work up is recommended. CP is likely  muscloskeletal.   Electronic Signatures: Sanoe Hazan A (PA-C)  (Signed 18-Sep-14 16:02)  Authored: General Aspect/Present Illness, History and Physical Exam, Review of System, Home Medications, Labs, EKG , Allergies, Vital Signs/Nurse's Notes, Impression/Plan Kathlyn Sacramento (MD)  (Signed 18-Sep-14 16:50)  Co-Signer: General Aspect/Present Illness, Home Medications, Allergies, Impression/Plan   Last Updated: 18-Sep-14 16:50 by Kathlyn Sacramento (MD)

## 2014-12-09 NOTE — Consult Note (Signed)
PATIENT NAME:  Javier [name], Javier [name] MR#:  [id number] DATE OF BIRTH:  02-04-1936  DATE OF CONSULTATION:  03/17/2013  REFERRING PHYSICIAN:  Max Sane, MD CONSULTING PHYSICIAN:  Doneta Public. Melrose Nakayama, MD  CHIEF COMPLAINT: Syncope.   HISTORY OF PRESENT ILLNESS: This is a 79 year old man with a history of chronic atrial fibrillation, hypertension, diastolic heart failure and orthostatic hypotension who presents following a spell of syncope.  According to the patient, he had just walked into his bathroom and urinated and then stood up to flush the toilet, walked over to wash his hands, and then he does not remember what happened after this. The patient says that his wife told him that he called to her and when she went into the bathroom he was lying down on the ground. The patient says that his wife told him that she did not hear a thump and he did not think that he fell down. The patient thinks that he laid down. The patient cannot recall feeling lightheadedness or having any abnormal sensations or feeling weak or dizzy leading up to the event. The patient denies any bruises. He says that his wife told him that he was unresponsive for about 10 minutes. The patient's wife saw him have some shaking, although he is uncertain where the shaking was located and what it looked like. He says that his wife toward him that his legs and hands were definitely shaking during the event. Over the last couple of weeks, the patient has had occasional tremors and has recently been started on primidone for the diagnosis of essential tremor. There was no tongue biting during the event. There was no incontinence during the event. The patient feels that he is back to normal now. During the event, his symptoms were severe. There was no clear provoking factor, besides going to the bathroom.   PAST MEDICAL HISTORY: Hypertension, atrial fibrillation, coronary artery disease, diastolic heart failure, prostate cancer, COPD, peripheral vascular  disease, obstructive sleep apnea, carotid endarterectomy, TIA, obesity.   PAST SURGICAL HISTORY: Carotid endarterectomy, cardiac stent placement, prostate surgery, Lasik eye surgery, CABG.   FAMILY HISTORY: Father had throat and esophageal cancer. Mother died from myocardial infarction.   SOCIAL HISTORY: The patient is a former smoker, and he quit smoking a couple of years ago. He denies alcohol or drug use.   MEDICATIONS: Tylenol, Aldactone, alprazolam, DuoNebs, ascorbic acid, amiodarone, Colace, Crestor, Cardizem, iron sulfate, Eligard, fish oil, Xopenex, Lasix, losartan, morphine, Singulair, multivitamin, omeprazole, potassium, Os-Cal, sertraline, Symbicort, Spiriva, senna, primidone, Ventolin.   REVIEW OF SYSTEMS:  A 12 point review is unremarkable, except for that which is mentioned in the history of present illness.   IMAGING: Head CT without contrast was personally reviewed and is essentially unremarkable.   TESTS: Chest x-ray shows possible pulmonary edema.   LABS: CBC is within normal limits. Troponin is negative.  Basic metabolic panel is unremarkable, except for elevated creatinine at 1.5. His blood sugars are within normal limits   GENERAL: Pleasant.  NAD.  Normocephalic and atraumatic.  EYES: Funduscopic exam shows normal disc size, appearance and C/D ratio without clear evidence of papilledema.  CARDIOVASCULAR: S1 and S2 sounds are within normal limits, without murmurs, gallops, or rubs.  MUSCULOSKELETAL: Bulk - Normal Tone - Normal Pronator Drift - Absent bilaterally. Ambulation - Gait and station are within normal limits. Romberg - Absent  R/L 5/5    Shoulder abduction (deltoid/supraspinatus, axillary/suprascapular n, C5) 5/5    Elbow flexion (biceps brachii, musculoskeletal n, C5-6) 5/5  Elbow extension (triceps, radial n, C7) 5/5    Finger adduction (interossei, ulnar n, T1)  5/5    Hip flexion (iliopsoas, L1/L2) 5/5    Knee flexion (hamstrings, sciatic n,  L5/S1)  5/5    Knee extension (quadriceps, femoral n, L3/4) 5/5    Ankle dorsiflexion (tibialis anterior, deep fibular n, L4/5) 5/5    Ankle plantarflexion (gastroc, tibial n, S1)   NEUROLOGICAL: MENTAL STATUS: Patient is oriented to person, place and time.  Recent and remote memory are intact.  Attention span and concentration are intact.  Naming, repetition, comprehension and expressive speech are within normal limits.  Patient's fund of knowledge is within normal limits for educational level.  CRANIAL NERVES: Normal    CN II (normal visual acuity and visual fields) Normal    CN III, IV, VI (extraocular muscles are intact) Normal    CN V (facial sensation is intact bilaterally) Normal    CN VII (facial strength is intact bilaterally) Normal    CN VIII (hearing is intact bilaterally) Normal    CN IX/X (palate elevates midline, normal phonation) Normal    CN XI (shoulder shrug strength is normal and symmetric) Normal    CN XII (tongue protrudes midline)  SENSATION: Intact to pain and temp bilaterally (spinothalamic tracts) Intact to position and vibration bilaterally (dorsal columns)  REFLEXES: R/L 2+/2+    Biceps 2+/2+    Brachioradialis  2+/2+    Patellar 2+/2+    Achilles  COORDINATION/CEREBELLAR: Finger to nose testing is within normal limits.   ASSESSMENT AND RECOMMENDATIONS: A 79 year old man with a complex medical history including coronary artery disease, atrial fibrillation, hypertension, diastolic heart failure and obstructive sleep apnea who presents with syncope. My first impression is that given this event occurred in the setting of shortly after using the toilet this may have represented a vasovagal event. The patient does not recall feeling lightheaded as he does during his typical episodes of orthostatic hypotension. However, given his history of known orthostatic hypotension it is possible that this may have been what caused his event.  If this was the case, then  the patient may have suffered from convulsive syncope whereby patients who have decreased blood flow to the brain during brief episodes of syncope can have some rhythmical shaking activity that is not epileptic, but is simply a result of transient brain hypoxia. I would recommend that this patient have a repeat of his carotid ultrasound to evaluate for recurrence of carotid artery stenosis. The patient showed a routine EEG to evaluate for ictal versus interictal epileptiform activity given the shaking that was identified during the syncopal event which may potentially have represented a complex partial seizure. I would not recommend starting him on seizure medication. I have reviewed the patient's labs and they are essentially unremarkable. I have noted that the patient's EKG is positive for atrial fibrillation. It is possible that the patient may have suffered from a transient worsening of his atrial fibrillation or a related transient arrhythmia that could have lead to his syncopal event. I would agree with Dr. Manuella Ghazi. I would recommend checking a TSH level. The patient's CBC is essentially unremarkable, and there is only very mild anemia, and I do not think this is causing or contributing to his syncopal event. The patient does not complain of the typical chest pain associated with a pulmonary embolus that could explain his event. I would not recommend a brain MRI or spinal tap.   ____________________________ Doneta Public. Melrose Nakayama, MD zep:sb  D: 03/18/2013 10:06:37 ET T: 03/18/2013 10:30:04 ET JOB#: [id number]  cc: Doneta Public. Melrose Nakayama, MD, <Dictator> Anabel Bene MD ELECTRONICALLY SIGNED 03/19/2013 9:22

## 2014-12-09 NOTE — Discharge Summary (Signed)
PATIENT NAME:  Javier [name], Javier [name] MR#:  [id number] DATE OF BIRTH:  06-30-1936  DATE OF ADMISSION:  12/13/2012 DATE OF DISCHARGE: 12/15/2012    REASON FOR ADMISSION: Severe hypotension.   OTHER DIAGNOSES INCLUDE: 1. Mild dehydration.  2. Orthostatic hypotension.  3. Chest pain due to low blood pressure/stable angina.  4. End-stage chronic obstructive pulmonary disease.  5. Atrial flutter status post cardioversion in the past, now in normal sinus.  6. Chronic iron deficiency anemia.  7. Chronic pressure ulcer of the buttock.  8. Acute on chronic kidney disease.   HOSPITAL COURSE: The patient is a very nice 79 year old gentleman who has history of multiple medical problems, was recently discharged on 12/11/2012 with a diagnosis of acute on chronic diastolic heart failure, chronic obstructive pulmonary disease exacerbation, healthcare-associated pneumonia, treated with Zyvox, chronic atrial fibrillation, history of hematuria and anemia, hypertension, history of prostate cancer, history of sepsis on previous admission. The patient came again on 12/13/2012 from [facility] with a history of chest pain. Apparently, the patient has been doing fine during the transfer to that facility, and the following day after transfer, started having dizziness, lightheadedness. He was found to have systolic blood pressures initially in the 90s, then dropped down to 60s. During that time, the patient developed significant chest pain, for which nitroglycerin was given. The chest pain was better, but persisted. The pain was radiating to the left shoulder, for which nitroglycerin was given again up to 3 times. His blood pressures are improving. The patient has been admitted and evaluated, seen by cardiology during this hospitalization. On the previous hospitalization, the patient was discharged with healthcare-acquired pneumonia, and he was taking a prednisone taper for that and Zyvox for MRSA. He only has 6 days left of that  medication now. At this moment, he did not have any fever. He did not have any signs of infection. He did have a urinalysis that showed some Candida, but not significant infection, mostly colonization. His BNP was 1800, which is very good for him. His BUN was 38, and his creatinine was 1.4. After some IV fluids, his creatinine improved to 1.25. His troponins were negative x3, and his white count was 9.6, without any significant variation. His white count remained around 7 or 8. His EKG showed atrial fibrillation for the most part. The patient was given IV fluids, and his medication management was changed.   As far as his medical problems, for his hypotension, the patient was seen by cardiology. They recommended to hold the Imdur, hold the Coreg as the patient is already taking diltiazem, and continue the rest of the medications, especially the ARB as the patient has significant cardiomyopathy. He, in the past, has had occlusion of 2 vein grafts with a PCI x4 and peripheral vascular disease with bilateral carotid endarterectomies in 2002, history of TIA in 2002 as well, but at this moment, cardiology deferred from any further intervention as the pain was very clearcut, brought on by the hypotension.   His chest pain has resolved. His blood pressure was orthostatic with a change of more than 30 points in systolic blood pressure when the patient changed from supine to sitting position. The patient feels more better after fluids. Today, Dr. Rockey Situ decided to give him 500 mL more and continue the Cardizem. Hold the Coreg and hold the Lasix. The Lasix only is going to be given if the patient has a weight gain of more than 3 pounds or he is starting to get into  diastolic dysfunction.   Other medical problems include chest pain that has no changes in EKG. No changes in cardiac enzymes. The patient has been recommended to continue aspirin. He is off the beta blocker due to severe hypotension, now only calcium channel  blocker.   Atrial fibrillation. The patient continues to take amiodarone and diltiazem. He is off anticoagulation due to bleeding complications.   Other medical problems are stable as per previous discharge summary.   Endstage COPD. Continue steroid taper and nebulizers.   Chronic iron deficiency anemia, stable. Xarelto stopped due to hematuria. Continue to observe.   Acute on chronic kidney injury, resolved with IV fluids.   Hold Lasix until weight gain of more than 3 pounds.   MEDICATIONS AT DISCHARGE:  1. Symbicort 160/4.5 mg 2 puffs twice daily.  2. Centrum Silver once daily. 3. Ventolin as needed for shortness of breath. 4. Ferrous sulfate 325 mg twice daily.  5. Docusate 100 mg 2 times daily. 6. Os-Cal twice daily. 7. Eligard 45 mg every 6 months subcutaneously.  8. Tamsulosin 0.4 mg once a day.  9. Amiodarone 200 mg once a day.  10. Morphine 0.25 mL or 5 mg every 4 hours as needed for pain. 11. Spiriva 18 mcg once daily.  12. Guaifenesin 600 mg every 6 hours for cough.  13. Alprazolam 0.25 mg every 6 hours for anxiety. 14. Crestor 20 mg once a day.  15. Omeprazole 40 mg twice daily.  16. Sertraline 50 mg once a day.  17. Acetaminophen with hydrocodone 325 mg/5 mg every 4 hours. 18. Omega-3 two caps once a day. 19. Levalbuterol 1.25/3 mL every 4 hours.  20. Senna as needed for constipation. 21. Montelukast 10 mg once a day.  22. Ascorbic acid 500 mg take 2 tablets once daily.  23. Diltiazem 180 mg 24-hour release once a day.  24. Losartan 25 mg once a day.  25. Prednisone taper.  26. Furosemide 20 mg as needed for weight gain or severe CHF, worsening edema. Take only if weight gain more than 3 pounds in 24 hours or 4 pounds in 4 days.  27. Linezolid 600 mg every 12 hours for 6 more days.   FOLLOWUP: With Dr. Ida Rogue in the next week and Dr. Mila Palmer, primary care physician.  TIME SPENT: I spent about 45 minutes with this discharge.    ____________________________ Fairview Sink, MD rsg:OSi D: 12/15/2012 11:58:22 ET T: 12/15/2012 12:43:18 ET JOB#: [id number]  cc: Henning Sink, MD, <Dictator> Klint Lezcano America Brown MD ELECTRONICALLY SIGNED 12/19/2012 11:05

## 2014-12-09 NOTE — H&P (Signed)
PATIENT NAME:  Javier [name], MCALEXANDER MR#:  [id number] DATE OF BIRTH:  1936-01-13  DATE OF ADMISSION:  12/04/2012  PRIMARY CARE PHYSICIAN: Francee Nodal, MD  HISTORY OF PRESENT ILLNESS:  The patient is a 79 year old Caucasian male with past medical history significant for history of recent admission on the 10th through 14th of April 2014 for prostatitis and hematuria, after which the patient had Foley catheter placed, also history of diastolic CHF, COPD on chronic oxygen therapy at home at nighttime, who presented back to the hospital for the third time in April for admission with complaints of not feeling well. According to the patient, he was here in the hospital from the 1st through 9th as well as 10th through 14th of April 2014. Now he presents back on 12/04/2012 with complaints of sudden increase of generalized weakness, also increasing shortness of breath and lower extremity swelling. According to the patient, he was doing well after discharge on 11/30/2012. However, after he finished his antibiotics as well as steroids, he suddenly noted that his shortness of breath seemed to be worsening. He is wheezing. He has some cough with no significant phlegm production. He has been also experiencing lower extremity swelling, more on the right side than the left side, as well as hand swelling. He admits of having significant orthopnea, however, denies PND. He uses at least 2 wedge pillows to sleep. Sometimes he sleeps even upright. Because of this weakness and fatigue which he has been having with exertion, he decided to come back to the hospital for further evaluation. He admits of not able to ambulate much and even getting sores in his buttock area. In the Emergency Room, he was noted to have congestive heart failure on his chest x-ray and hospitalist services were contacted for admission.   PAST MEDICAL HISTORY: Significant for admissions twice already in April 2014. On the 10th through 14th of April 2014, the  patient was here for prostatitis, hematuria, also COPD exacerbation and congestive heart failure, acute on chronic diastolic exacerbation. He also has history of A. flutter status post ablation, history of prostate carcinoma status post brachytherapy, history of hypertension, coronary artery disease, COPD, chronic respiratory failure on oxygen at home (2 liters of oxygen through nasal cannula at nighttime), obstructive sleep apnea, peripheral vascular disease, obesity, history of iron deficiency anemia for which he underwent upper GI endoscopy as well as colonoscopy on 11/09/2012 which showed nonbleeding angiectasias in duodenum, and also hyperlipidemia.   PAST SURGICAL HISTORY: Coronary artery bypass grafting, carotid endarterectomy, cardiac stent, LASIK surgery on his eye.   MEDICATIONS: According to medical records, the patient is on:  1.  Acetaminophen/hydrocodone 325/5, 1 tablets every 4 hours as needed. 2.  Albuterol/ipratropium 2.5/0.5 in 3 mL inhalation solution 4 times daily as needed.  3.  Alprazolam 0.25 mg p.o. every 6 hours as needed.  4.  Amiodarone 200 mg p.o. daily.  5.  Amoxicillin/clavulanate 875/125 mg 1 tablet twice a day, urology to adjust antibiotics.  6.  Carvedilol 6.25 mg 1 tablet twice daily. 7.  Centrum Silver 1 daily. 8.  Crestor 10 mg p.o. daily. 9.  Diltiazem CD 240 mg p.o. once daily. 10.  Docusate sodium 100 mg p.o. twice daily.  11.  Eligard 45 mg every 6 months subcutaneous injection.  12.  Iron sulfate 325 mg p.o. twice daily.  13.  Fish oil 1 gram twice a day.  14.  Isosorbide mononitrate 30 mg p.o. at bedtime.  15.  Lasix 10 mg p.o. every  second day.  16.  Levaquin 250 mg p.o. every 24 hours.  17.  Losartan 50 mg p.o. once daily.  18.  Montelukast 10 mg p.o. once daily.  19.  Nitrostat 0.4 mg sublingually every 5 minutes as needed.  20.  Omeprazole 20 mg p.o. once daily.  21.  Os-Cal 500/400 units 1 tablet twice a day.  22.  Oxybutynin 5 mg p.o. every 6  hours as needed.  23.  Xarelto 20 mg p.o. once daily at bedtime.  24.  Spiriva 18 mcg inhalation once daily.  25.  Symbicort 160/4.5, 2 puffs twice a day.  26.  Tamsulosin 0.4 mg p.o. once daily.  27.  Ventolin HFA 2 puffs 4 times daily as needed.  28.  Vitamin C 1 gram once daily.   SOCIAL HISTORY: Quit smoking approximately 2 years ago according to medical records. No alcohol or illicit drugs. Lives at home with daughter as well as patient's wife.  FAMILY HISTORY: Significant for diabetes as well as coronary artery disease.   REVIEW OF SYSTEMS: Positive for fatigue and weakness, especially on exertion. Weight gain since he was started on steroids approximately a year ago. He is not sure how much he gained. Some chest pains, especially whenever he coughs. He describes that as burning sensation, however, no pains in the chest if he does not cough. Some blurring of vision which seems to be chronic. Cough as well as wheezes and shortness of breath, especially whenever he exerts, also some orthopnea. He has to use a few wedge pillows to sleep and sometimes sleeps upright. Lower extremity edema, especially in the right lower extremity. Constipation intermittently because of iron supplements. Admits of improvement of constipation with milk of magnesia intermittently. Also intermittent hematuria. He is on Xarelto; he is supposed to use Xarelto through the end of April 2014; it was given to him because he was in atrial fibrillation and he was converted into sinus rhythm. According to the patient's daughter, the patient had been walking with a walker for stability; however, over the past few days he is not able to walk much. He is having difficulty even to walk 30 feet. He was not able to walk even 30 feet today. He had to use chair to rest in between. CONSTITUTIONAL: Otherwise, denies any high fevers or weight loss. EYES: Denies double vision, glaucoma or cataracts.  ENT: Denies any tinnitus, allergies,  epistaxis, sinus pain or difficulty swallowing. RESPIRATORY: Denies any hemoptysis, asthma. Admits of COPD. CARDIOVASCULAR: Admits of orthopnea. Denies any arrhythmias, palpitations or syncope. GASTROINTESTINAL: Denies any nausea, vomiting, diarrhea, rectal bleeding, change in bowel habits.  GENITOURINARY: Denies any dysuria, frequency, incontinence. ENDOCRINE: Denies any polydipsia, nocturia, thyroid problems, heat or cold intolerance or thirst. HEMATOLOGIC: Denies any anemia, easy bruising, bleeding, swollen glands. SKIN: Denies acne, rashes, lesions or change in moles.  MUSCULOSKELETAL: Denies arthritis, cramps, swelling.  NEUROLOGICAL: No numbness, epilepsy or tremors. PSYCHIATRIC: Denies anxiety or insomnia.   PHYSICAL EXAMINATION:  VITAL SIGNS: On arrival to the hospital, the patient's temperature is 98.7, pulse 74, respiratory rate 24, blood pressure 105/51, saturation 90% on room air.  GENERAL: This is a well-developed, obese Caucasian male in mild distress, especially whenever he tries to move around. He has some shortness of breath whenever he tries to move around or speaks for prolonged period of time. HEENT: His pupils are equal, reactive to light. Extraocular movements intact. No icterus or conjunctivitis. Has normal hearing. No pharyngeal erythema. Mucosa is moist.  NECK: No  masses. Supple, nontender. Thyroid is not enlarged. No adenopathy. No JVD or carotid bruits bilaterally. Full range of motion.  LUNGS: Markedly copious rales as well as crackles bilaterally, especially in anterior lung fields, less so in posterior lung fields. The patient does have some diminished breath sounds posteriorly and some wheezing posteriorly. He does have labored inspirations, as mentioned above, mostly whenever he speaks for prolonged period of time or whenever he tries to move around. He does have increased effort to breathe. No dullness to percussion. In mild respiratory distress.  CARDIOVASCULAR:  S1, S2 appreciated. Approximately IV/VI systolic murmur was heard, radiating to his left axilla. PMI not lateralized. Chest is nontender to palpation.  EXTREMITIES: Pedal pulses 1+. Trace lower extremity edema on the left. However, the patient does have right lower extremity edema which is significant, approximately 2 to 3+, mostly in the lower shin on the right.  ABDOMEN: Soft, nontender. Bowel sounds are present. No hepatosplenomegaly or masses were noted.  RECTAL: Deferred.  MUSCULOSKELETAL: Muscle strength: Able to move all extremities. Marked kyphosis. No cyanosis. Gait is not tested.  SKIN: No acne, rashes, lesions, erythema, nodularity, induration. It was warm and dry to palpation.  LYMPHATIC : No adenopathy in cervical region.  NEUROLOGICAL: Cranial nerves grossly intact. Sensory is intact. No dysarthria or aphasia. The patient is alert, oriented to time, person and place, cooperative. Memory is good.  PSYCHIATRIC: No significant confusion, agitation or depression noted.   LABORATORY AND DIAGNOSTIC DATA: EKG done in Emergency Room showed normal sinus rhythm at 74 beats per minute, normal axis. No acute ST-T changes were noted. B-type  natriuretic peptide was elevated at 1404. Glucose 109, BUN and creatinine were 30 and 1.42, sodium 132. Estimated GFR for non-African American would be 48. Liver enzymes: Albumin level of 2.5, otherwise unremarkable study. The patient's cardiac enzymes: First set negative. White blood cell count is elevated to 18.3; hemoglobin was 9.5 and platelet count was 107. A pH was checked and was found to be 7.47 and pCO2 was 39.   RADIOLOGIC STUDIES: Chest x-ray, portable single view, 12/04/2012, revealed congestive heart failure with pulmonary edema.   ASSESSMENT AND PLAN:  1.  Congestive heart failure, acute on chronic diastolic: Admit patient to medical floor, telemetry. Starting her on Lasix intravenously. Will hold patient's angiotensin receptor blocker due to renal  insufficiency. 2.  Generalized weakness: Will initiate physical therapy. The patient may benefit from short-term rehabilitation placement.  3.  Iron deficiency anemia: Will advance the patient's proton pump inhibitors to twice a day. Will also continue him on iron supplements. Will watch for hematuria. Will continue Xarelto for now, as patient is to finish his course and course should be done by the end of this month. Will follow hemoglobin level. I am surprised of 2 gram drop at this point with no significant bleeding. However, it could be just dilutional. 4.  Buttock pressure ulcer: Will initiate dressings as well as care per protocol. Will also ask physical therapist to see patient, as I suspect that generalized weakness as well as immobility was the cause.  5.  Renal insufficiency: As above, we will hold angiotensin receptor blocker. Will follow creatinine with diuresis.  6.  Leukocytosis, questionable recent steroid therapy related: Will get hematologist involved but looking into pattern, I doubt hematologic disorder, as the patient's white blood cell count goes down to normal intermittently and increases whenever he comes back to the hospital. I suspect steroid related.   TIME SPENT: One hour.  ____________________________ Theodoro Grist, MD rv:jm D: 12/04/2012 18:07:05 ET T: 12/04/2012 20:11:09 ET JOB#: [id number]  cc: Theodoro Grist, MD, <Dictator> Francee Nodal, MD Mattison Golay MD ELECTRONICALLY SIGNED 12/20/2012 18:06

## 2014-12-09 NOTE — Discharge Summary (Signed)
PATIENT NAME:  Javier [name], Javier [name] MR#:  [id number] DATE OF BIRTH:  July 02, 1936  DATE OF ADMISSION:  11/17/2012 DATE OF DISCHARGE:  11/25/2012  DISCHARGE DIAGNOSES: 1.  Chronic obstructive pulmonary disease exacerbation.  2.  New-onset atrial fibrillation, status post successful cardioversion, now in sinus rhythm.  3.  History of coronary artery disease, status post coronary artery bypass graft.  4.  Chronic diastolic heart failure.  5.  Chronic iron deficiency anemia.  6.  Urinary retention.  7.  History of prostate cancer with brachytherapy.   DISCHARGE MEDICATIONS: 1.  Symbicort 160/4.5, 2 puffs b.i.d.  2.  Centrum Silver 1 tablet daily.  3.  Vitamin C 1 gram p.o. daily.  4.  Xanax 0.25 mg every 6 hours as needed for anxiety.  5.  Crestor 20 mg daily.  6.  Ventolin 2 puffs 4 times daily.  7.  Combivent nebulizers 4 times daily as needed.  8.  Lasix 20 mg p.r.n. for swelling.  9.  Tramadol/acetaminophen 37.5/325, 1 tablet as needed for joint pains 2 times a day. 10. The patient is on Mucinex as needed for congestion.  11. Isosorbide mononitrate 30 mg daily in the evening.  12.   Omeprazole 20 mg daily.  13.  Fish oil 1 gram p.o. b.i.d.  14.  Ferrous sulfate 325 mg p.o. b.i.d.  15.  Nitrostat, sublingual as needed for chest pain.  16. Singulair 10 mg daily.  49.  Colace, as needed for constipation.  18. Os-Cal 1 tablet p.o. b.i.d.  19.  Eligard 45 mg every 6 months.   NEW MEDICATIONS: 1.  Losartan 50 mg daily.  2.  Tamsulosin 0.4 mg p.o. daily.  3.  Amiodarone 200 mg p.o. b.i.d.  4.  Cardizem CD 300 mg p.o. daily. 5.  Xarelto 20 mg daily for 4 weeks.  6.  Nystatin swish-and-spit for oral thrush.   7.  Coreg 6.25 mg p.o. b.i.d.  8. Augmentin 875/125 b.i.d. for 10 days.  9.  Prednisone 20 mg 2 tablets daily for 2 days, 1 tablet daily for 2 days, and then stop.   DIET:  Low-sodium, low-fat diet.   The patient was discharged home with Foley, and the patient is to follow up with  his urologist for discontinuing the Foley. The patient also needs to follow up with Dr. Rockey Situ in 2 to 3 weeks and also with primary doctor, Dr. Raul Del in 2 to 3 weeks.   PATIENT'S CONSULTATIONS:  Urology consult with Dr. Edrick Oh, cardiology consult with Dr. Rockey Situ.   HOSPITAL COURSE:  A 79 year old male with multiple medical problems of COPD, history of  CABG, carotid endarterectomy, hypertension, hyperlipidemia, chronic diastolic heart failure,  sleep apnea, peripheral vascular disease, history of prostate cancer status post brachytherapy, came in because of shortness of breath. The patient also had some cough.   1.  COPD exacerbation: The patient was given oral steroids and antibiotics by Dr. Raul Del, but came here because of shortness of breath and also wheezing. The patient was admitted to the hospitalist service for COPD exacerbation, started on Rocephin and Zithromax along with Solu-Medrol and oxygen. The patient's breathing was better, and his oxygen saturations improved and he has minimal wheezing today. He will be going with his home medications, nebulizers,  steroids, and Spiriva, and also Symbicort and continue oxygen at night and follow with  Dr. Raul Del. Wrote for Augmentin to continue, and he finished Rocephin and Zithromax for  9 days here.  2.  New-onset atrial fibrillation: Admitted  to ICU. Heart rate was around 140s to 150s on admission. Started on Cardizem drip, also a heparin drip. The patient's heparin drip was continued until April 4th. The patient went into atrial flutter the following day. Seen by cardiologist Dr. Rockey Situ and had a cardioversion on April 3rd, and then since then he has been in sinus rhythm. The patient's heparin stopped and started Xarelto for anticoagulation. For rate control, he is on Cardizem 300 mg daily, amiodarone 200 mg twice daily, and the patient's heart rate  has been stable around the 60s, and blood pressure also is stable at 150/66. Dr. Rockey Situ  recommended he needs to be on Xarelto for 4 weeks and after that maybe he can come off the Xarelto, but he needs to on amiodarone, also Cardizem and Coreg. The patient is stable otherwise, in sinus rhythm. Denies any chest pain or shortness of breath. The patient was able to come off the Cardizem drip and switch it to p.o. Cardizem, and moved from ICU to telemetry. He can continue his Cardizem and Coreg and amiodarone, and follow up with Dr. Esmond Plants. Dr. Rockey Situ said he can give samples for Xarelto for 4 weeks, and he can see him in the office.  3.  Chronic diastolic heart failure: The patient's echocardiogram done on April 2nd showed EF of around 50% to 55%. At that time rhythm was in atrial flutter, but he has normal systolic function and also LVH was symmetric. The patient has chronic diastolic heart failure. Continue Coreg, and also losartan was started and it can be continued.  4. Coronary artery disease, status post CABG: The patient had a history of coronary artery disease, bypass surgery in 2007, and had a PCA 4 times. The patient was on aspirin before, but because he is on Xarelto and because of his history of GI bleeds before, the patient is only on Xarelto. THE PATIENT HAS A MUTATION FOR PLAVIX, SO HE CANNOT TAKE PLAVIX.  5. Urinary retention: The patient initially got a Foley when he was admitted to the ICU, but that was discontinued, but the patient continued to have discomfort and dysuria. Initially we thought it was just related to trauma, but he had a post-void residual up to 800 mL, and we did an in-and-out cath, around 1300 mL came out 2 days ago; that was on 7th of April, and the patient started on Flomax 0.4 mg, 1 dose, and then again yesterday morning, that was on the 8th, the patient had a residual about 500 mL. When the catheter was inserted the patient got around 1400 mL of urine, so Dr. Edrick Oh, urologist, has seen the patient, and because of his history of prostate cancer,  brachytherapy and the patient's urinary retention, he suggested a Foley catheter and also he suggested to start on Flomax. The patient needs Foley catheter. I wrote 1 week to 10 days. The patient referred to follow up with his urologist in [place]. The patient usually follows up with him, with Dr. Lucia Bitter in [place], and his PSA was around 0.2, and he does take Eligard shots for cancer. The patient will be going home with Foley catheter for his urinary retention, and the patient said it is helping him. He is okay with going home with a Foley, and he will be continued on Flomax 0.4 mg and he will see Dr. Lucia Bitter, urologist in [place], and probably he needs a cystoscopy for further evaluation of his bladder, and also he needs a voiding trial in 7  to 10 days.  6. Iron deficiency anemia: The patient had an EGD and colonoscopy by Dr. Vira Agar in March, and the patient was found to have nonbleeding colonic angioectasias, treated with thermal therapy at that time and also had 4 polyps in the rectosigmoid junction and internal hemorrhoids, and the patient's EGD showed 2 small angioectasias  without bleeding were found in the second part of the duodenum, and coagulation for bleeding prevention using argon plasma laser was done that time. So this time we discussed with Dr. Vira Agar to see if we can start the patient on Xarelto or not, and he suggested that because the patient's bleeding was stopped he suggested only one anticoagulation, and he said the Xarelto is fine. The patient should not take aspirin or Plavix because of recent history of GI bleed, so he is only on Xarelto. He knows that he cannot take aspirin or Plavix. He has chronic iron deficiency anemia. Hemoglobin stayed stable at 11.9.   The patient can follow up with Dr. Vira Agar as an outpatient, but continue iron sulfate and also he can continue Xarelto, but if any evidence of bleeding he needs to stop Xarelto.   PERTINENT LAB DATA DURING THE HOSPITAL STAY: The  patient's BNP on admission was 2140. Electrolytes on admission: Sodium 141, potassium 3.3, chloride 104, bicarb 28, BUN 22, creatinine 1. WBC on admission 13, hemoglobin 11.6, hematocrit 30.8, platelets 188. The patient's LDL 84. Troponin less than 0.01. Hemoglobin A1c 5.8. Hemoglobin, recent one on April 8th was only 11.9, but he was stable during the hospital stay.  The patient's condition is stable for discharge.   Time spent on discharge preparation: More than 30 minutes.   All his medications went to his pharmacy, [facility].   ____________________________ Epifanio Lesches, MD sk:dm D: 11/25/2012 10:06:00 ET T: 11/25/2012 10:33:38 ET JOB#: [id number]  cc: Minna Merritts, MD Denice Bors. Jacqlyn Larsen, MD Herbon E. Raul Del, MD Francee Nodal, MD Epifanio Lesches, MD, <Dictator>        Epifanio Lesches MD ELECTRONICALLY SIGNED 12/08/2012 19:01

## 2014-12-09 NOTE — Discharge Summary (Signed)
PATIENT NAME:  Javier [name], Javier [name] MR#:  [id number] DATE OF BIRTH:  04/02/1936  DATE OF ADMISSION:  11/17/2012 DATE OF DISCHARGE:  11/25/2012  DISCHARGE DIAGNOSES: 1.  Chronic obstructive pulmonary disease exacerbation.  2.  New-onset atrial fibrillation, status post successful cardioversion, now in sinus rhythm.  3.  History of coronary artery disease, status post coronary artery bypass graft.  4.  Chronic diastolic heart failure.  5.  Chronic iron deficiency anemia.  6.  Urinary retention.  7.  History of prostate cancer with brachytherapy.   DISCHARGE MEDICATIONS: 1.  Symbicort 160/4.5, 2 puffs b.i.d.  2.  Centrum Silver 1 tablet daily.  3.  Vitamin C 1 gram p.o. daily.  4.  Xanax 0.25 mg every 6 hours as needed for anxiety.  5.  Crestor 20 mg daily.  6.  Ventolin 2 puffs 4 times daily.  7.  Combivent nebulizers 4 times daily as needed.  8.  Lasix 20 mg p.r.n. for swelling.  9.  Tramadol/acetaminophen 37.5/325, 1 tablet as needed for joint pains 2 times a day. 10. The patient is on Mucinex as needed for congestion.  11. Isosorbide mononitrate 30 mg daily in the evening.  12.   Omeprazole 20 mg daily.  13.  Fish oil 1 gram p.o. b.i.d.  14.  Ferrous sulfate 325 mg p.o. b.i.d.  15.  Nitrostat, sublingual as needed for chest pain.  16. Singulair 10 mg daily.  40.  Colace, as needed for constipation.  18. Os-Cal 1 tablet p.o. b.i.d.  19.  Eligard 45 mg every 6 months.   NEW MEDICATIONS: 1.  Losartan 50 mg daily.  2.  Tamsulosin 0.4 mg p.o. daily.  3.  Amiodarone 200 mg p.o. b.i.d.  4.  Cardizem CD 300 mg p.o. daily. 5.  Xarelto 20 mg daily for 4 weeks.  6.  Nystatin swish-and-spit for oral thrush.   7.  Coreg 6.25 mg p.o. b.i.d.  8. Augmentin 875/125 b.i.d. for 10 days.  9.  Prednisone 20 mg 2 tablets daily for 2 days, 1 tablet daily for 2 days, and then stop.   DIET:  Low-sodium, low-fat diet.   The patient was discharged home with Foley, and the patient is to follow up with  his urologist for discontinuing the Foley. The patient also needs to follow up with Dr. Rockey Situ in 2 to 3 weeks and also with primary doctor, Dr. Raul Del in 2 to 3 weeks.   PATIENT'S CONSULTATIONS:  Urology consult with Dr. Edrick Oh, cardiology consult with Dr. Rockey Situ.   HOSPITAL COURSE:  A 79 year old male with multiple medical problems of COPD, history of  CABG, carotid endarterectomy, hypertension, hyperlipidemia, chronic diastolic heart failure,  sleep apnea, peripheral vascular disease, history of prostate cancer status post brachytherapy, came in because of shortness of breath. The patient also had some cough.   1.  COPD exacerbation: The patient was given oral steroids and antibiotics by Dr. Raul Del, but came here because of shortness of breath and also wheezing. The patient was admitted to the hospitalist service for COPD exacerbation, started on Rocephin and Zithromax along with Solu-Medrol and oxygen. The patients breathing was better, and his oxygen saturations improved and he has minimal wheezing today. He will be going with his home medications, nebulizers,  steroids, and Spiriva, and also Symbicort and continue oxygen at night and follow with  Dr. Raul Del. Wrote for Augmentin to continue, and he finished Rocephin and Zithromax for  9 days here.  2.  New-onset atrial fibrillation: Admitted  to ICU. Heart rate was around 140s to 150s on admission. Started on Cardizem drip, also a heparin drip. The patients heparin drip was continued until April 4th. The patient went into atrial flutter the following day. Seen by cardiologist Dr. Rockey Situ and had a cardioversion on April 3rd, and then since then he has been in sinus rhythm. The patients heparin stopped and started Xarelto for anticoagulation. For rate control, he is on Cardizem 300 mg daily, amiodarone 200 mg twice daily, and the patients heart rate  has been stable around the 60s, and blood pressure also is stable at 150/66. Dr. Rockey Situ  recommended he needs to be on Xarelto for 4 weeks and after that maybe he can come off the Xarelto, but he needs to on amiodarone, also Cardizem and Coreg. The patient is stable otherwise, in sinus rhythm. Denies any chest pain or shortness of breath. The patient was able to come off the Cardizem drip and switch it to p.o. Cardizem, and moved from ICU to telemetry. He can continue his Cardizem and Coreg and amiodarone, and follow up with Dr. Esmond Plants. Dr. Rockey Situ said he can give samples for Xarelto for 4 weeks, and he can see him in the office.  3.  Chronic diastolic heart failure: The patient's echocardiogram done on April 2nd showed EF of around 50% to 55%. At that time rhythm was in atrial flutter, but he has normal systolic function and also LVH was symmetric. The patient has chronic diastolic heart failure. Continue Coreg, and also losartan was started and it can be continued.  4. Coronary artery disease, status post CABG: The patient had a history of coronary artery disease, bypass surgery in 2007, and had a PCA 4 times. The patient was on aspirin before, but because he is on Xarelto and because of his history of GI bleeds before, the patient is only on Xarelto. THE PATIENT HAS A MUTATION FOR PLAVIX, SO HE CANNOT TAKE PLAVIX.  5. Urinary retention: The patient initially got a Foley when he was admitted to the ICU, but that was discontinued, but the patient continued to have discomfort and dysuria. Initially we thought it was just related to trauma, but he had a post-void residual up to 800 mL, and we did an in-and-out cath, around 1300 mL came out 2 days ago; that was on 7th of April, and the patient started on Flomax 0.4 mg, 1 dose, and then again yesterday morning, that was on the 8th, the patient had a residual about 500 mL. When the catheter was inserted the patient got around 1400 mL of urine, so Dr. Edrick Oh, urologist, has seen the patient, and because of his history of prostate cancer,  brachytherapy and the patient's urinary retention, he suggested a Foley catheter and also he suggested to start on Flomax. The patient needs Foley catheter. I wrote 1 week to 10 days. The patient referred to follow up with his urologist in [place]. The patient usually follows up with him, with Dr. Lucia Bitter in [place], and his PSA was around 0.2, and he does take Eligard shots for cancer. The patient will be going home with Foley catheter for his urinary retention, and the patient said it is helping him. He is okay with going home with a Foley, and he will be continued on Flomax 0.4 mg and he will see Dr. Lucia Bitter, urologist in [place], and probably he needs a cystoscopy for further evaluation of his bladder, and also he needs a voiding trial in 7  to 10 days.  6. Iron deficiency anemia: The patient had an EGD and colonoscopy by Dr. Vira Agar in March, and the patient was found to have nonbleeding colonic angioectasias, treated with thermal therapy at that time and also had 4 polyps in the rectosigmoid junction and internal hemorrhoids, and the patients EGD showed 2 small angioectasias  without bleeding were found in the second part of the duodenum, and coagulation for bleeding prevention using argon plasma laser was done that time. So this time we discussed with Dr. Vira Agar to see if we can start the patient on Xarelto or not, and he suggested that because the patient's bleeding was stopped he suggested only one anticoagulation, and he said the Xarelto is fine. The patient should not take aspirin or Plavix because of recent history of GI bleed, so he is only on Xarelto. He knows that he cannot take aspirin or Plavix. He has chronic iron deficiency anemia. Hemoglobin stayed stable at 11.9.   The patient can follow up with Dr. Vira Agar as an outpatient, but continue iron sulfate and also he can continue Xarelto, but if any evidence of bleeding he needs to stop Xarelto.   PERTINENT LAB DATA DURING THE HOSPITAL STAY: The  patient's BNP on admission was 2140. Electrolytes on admission: Sodium 141, potassium 3.3, chloride 104, bicarb 28, BUN 22, creatinine 1. WBC on admission 13, hemoglobin 11.6, hematocrit 30.8, platelets 188. The patient's LDL 84. Troponin less than 0.01. Hemoglobin A1c 5.8. Hemoglobin, recent one on April 8th was only 11.9, but he was stable during the hospital stay.  The patient's condition is stable for discharge.   Time spent on discharge preparation: More than 30 minutes.   All his medications went to his pharmacy, [facility].   ____________________________ Epifanio Lesches, MD sk:dm D: 11/25/2012 10:06:00 ET T: 11/25/2012 10:33:38 ET JOB#: [id number]  cc: Minna Merritts, MD Denice Bors. Jacqlyn Larsen, MD Herbon E. Raul Del, MD Francee Nodal, MD Epifanio Lesches, MD, <Dictator>

## 2014-12-09 NOTE — H&P (Signed)
PATIENT NAME:  Javier [name], Javier [name] MR#:  [id number] DATE OF BIRTH:  04-27-1936  DATE OF ADMISSION:  06/24/2013  PRIMARY CARE PHYSICIAN:  Dr. Casimer Lanius.  CARDIOLOGIST:  Dr. Rockey Situ.  CHIEF COMPLAINT:   Chest pain.  HISTORY OF PRESENT ILLNESS: This is a 79 year old man with history of angina and coronary artery disease. He presents to the ER with chest pain, 4 out of 10 in intensity, worse with coughing. He took 3 nitroglycerin at home and chewed a baby aspirin. He is not sure if this is his angina or atrial fibrillation. He is feeling some palpitations. Chest pain is sharp pain, left center of chest radiating to the left arm, associated with some diaphoresis and some shortness of breath. In the ER, first EKG was negative and troponin was negative. Hospitalist services were contacted for further evaluation.   PAST MEDICAL HISTORY: Coronary artery disease, angina, chronic obstructive pulmonary disease on oxygen, atrial fibrillation on Coumadin, oxygen at night, CVA, status post and tPA on 2 occasions.   PAST SURGICAL HISTORY: CABG and carotid endarterectomy.   ALLERGIES: prednisone (causes thrush)  MEDICATIONS: Include acetaminophen/hydrocodone 5/325, 1 tablet every 4 hours as needed for pain; acetaminophen/tramadol 37.5/325, 1 tablet once or twice a day as needed for pain; DuoNeb nebulizer solution 3 mL 4 times a day as needed for shortness of breath, Xanax 0.25 mg every 6 hours as needed for anxiety, aspirin 81 mg daily, codeine/guaifenesin 10 mg/100 mg per 5 mL, 10 mL every 4 hours as needed for pain from cough, Crestor 20 mg at bedtime, Colace 100 mg twice a day, Eligard 45 mg every 6 months subcutaneous injection, ferrous sulfate 325 mg twice a day, fish oil 1 capsule twice a day, fludrocortisone 0.1 mg daily, Lasix 20 mg daily as needed for swelling, Klor-Con 10 mEq daily as needed with the Lasix, Singulair 10 mg at bedtime, morphine 20 mg/mL 0.25 mL every 4 hours as needed for pain, Mucinex 600  mg extended release twice a day, Nitrostat 0.4 mg sublingually every 5 minutes as needed for chest pain, nystatin 100,000 units/mL twice a day, Prilosec 20 mg daily, Robitussin-DM 10 mL every 4 hours as needed for cough, senna 8.6, 1 tablet daily, Spiriva 1 inhalation daily, Symbicort 160/4.5, 2 puffs twice a day, warfarin 3 mg Monday, Wednesday, Friday and Sunday, 1.5 mg Tuesday, Thursday, Saturday; Zoloft 50 mg in the morning.   SOCIAL HISTORY: Quit smoking last year. No alcohol. No drug use. Used to work as a Manufacturing engineer.   FAMILY HISTORY: Father died at 41, had throat or stomach cancer, also had gout and ulcers. Mother died at 58, had heart disease and diabetes.   REVIEW OF SYSTEMS:  CONSTITUTIONAL: Positive for sweating. No fever. No chills. Positive for fatigue. No weight gain. No weight loss.  EYES: No blurry vision.  EARS, NOSE, MOUTH AND THROAT: Positive for nose bleed. No sore throat. No difficulty swallowing.  CARDIOVASCULAR: Positive for chest pain. Positive for palpitations.  RESPIRATORY: Positive for shortness of breath. Positive for cough.  GASTROINTESTINAL: Positive for nausea. No vomiting. No abdominal pain. Positive for diarrhea. No bright red blood per rectum. No melena.  GENITOURINARY: No burning on urination. No hematuria.  MUSCULOSKELETAL: Positive for right knee pain and right hip pain.  INTEGUMENT: No rashes or eruptions.  NEUROLOGIC: No fainting or blackouts.  PSYCHIATRIC: Positive for anxiety.  ENDOCRINE: No thyroid problems.  HEMATOLOGIC AND LYMPHATIC: No anemia.   PHYSICAL EXAMINATION: VITAL SIGNS: Temperature 98.1, pulse 82, respirations 19,  blood pressure 149/90, pulse ox 96% on oxygen.  GENERAL: No respiratory distress.  EYES: Conjunctivae and lids normal. Pupils equal, round and reactive to light. Extraocular muscles intact. No nystagmus.  EARS, NOSE, MOUTH AND THROAT: Tympanic membranes: No erythema. Nasal mucosa: No erythema. Throat: No  erythema, no exudate seen. Lips and gums: No lesions.  NECK: No JVD. No bruits. No lymphadenopathy. No thyromegaly. No thyroid nodules palpated.  RESPIRATORY: Clear to auscultation. No use of accessory muscles to breathe. No rhonchi, rales or wheeze heard.  CARDIOVASCULAR SYSTEM: S1, S2, irregularly irregular. No gallops, rubs or murmurs heard. Carotid upstroke 2+ bilaterally. No bruits. Dorsalis pedis pulses 2+ bilaterally, 2+ edema bilateral lower extremity.  CHEST WALL: Positive pain to palpation. Point tenderness over the left chest.  ABDOMEN: Soft, nontender. No organomegaly/splenomegaly. Normoactive bowel sounds. No masses felt.  LYMPHATIC: No lymph nodes in the neck.  MUSCULOSKELETAL: 2+ edema. No clubbing. No cyanosis.  SKIN: No rashes or ulcers seen.  NEUROLOGIC: Cranial nerves II through XII grossly intact. Deep tendon reflexes 1+ bilateral lower extremities.  PSYCHIATRIC: The patient is oriented to person, place and time.   LABORATORY AND RADIOLOGICAL DATA: Chest x-ray show changes of early CHF. White blood cell count 13.3, H and H 11.5 and 33.6, platelet count of 155. Glucose 90, BUN 15, creatinine 1.13, sodium 139, potassium 3.8, chloride 108, CO2 of 27, calcium 8.5. Troponin negative. Liver function tests normal. Lipase normal. BNP 2976. PTT 47.9, INR 2.7.   ASSESSMENT AND PLAN: 1.  Chest pain with history of coronary artery disease. Pain is reproducible to chest wall. The patient is ALLERGIC TO PREDNISONE, so I will not give any prednisone. I did try a  Percocet, and I will give a dose of morphine, also am going to give a dose of Xanax. ER physician gave a little bit of nitroglycerin. The patient is still having continuous chest pain since 2:00 p.m. today. The patient is very nervous about this and did not want to go home earlier. I am ordering a second troponin. If that is negative, I will try to send the patient home again. I did speak with Dr. Rockey Situ, the patient's cardiologist.   Since the pain is reproducible, he will follow the patient up in the office tomorrow at 3:45 p.m.  The last time the patient was in the hospital for chest pain episode, it was reproducible. No testing was done and that was back in September. The patient did have a stress test in January 2013, which was a low risk scan.  2.  Chronic obstructive pulmonary disease on oxygen. Respiratory status stable.  3.  Atrial fibrillation. Rate controlled on Coumadin, which level is therapeutic.  4.  History of cerebral vascular accident, status post tPA in the past. The patient is currently therapeutic on Coumadin.   TIME SPENT ON PATIENT CARE TODAY: Greater than 60 minutes.   If the patient is sent home from the ER, it will be billed as an ER consult and discharge. If the patient is admitted, it would be as an observation. This will be based on the next troponin and patient's clinical status at that time.    ____________________________ Tana Conch. Leslye Peer, MD rjw:[id number] D: 06/24/2013 18:59:00 ET T: 06/24/2013 19:08:08 ET JOB#: [id number]  cc: Tana Conch. Leslye Peer, MD, <Dictator> Francee Nodal, MD Minna Merritts, MD Marisue Brooklyn MD ELECTRONICALLY SIGNED 06/25/2013 18:58

## 2014-12-09 NOTE — H&P (Signed)
PATIENT NAME:  Javier [name], SHATTO MR#:  [id number] DATE OF BIRTH:  21-Apr-1936  DATE OF ADMISSION:  05/06/2013  PRIMARY CARE PROVIDER: Dr. Francee Nodal.  EMERGENCY DEPARTMENT REFERRING DOCTOR: Dr. Owens Shark.   CHIEF COMPLAINT: Chest pain.   HISTORY OF PRESENT ILLNESS: The patient is a 79 year old white male with multiple medical problems including diastolic CHF, COPD, chronic atrial fibrillation, hypertension, prostate cancer, history of coronary artery disease with CABG and stent. History of peripheral vascular disease, obstructive sleep apnea, who was actually seen here on 04/19/2013. At that time, he had diagnosis of slurred speech and left arm and left leg weakness, so he was started on a TPA. Then, subsequently transferred to [facility]. The patient required three day stay at [facility] and subsequently discharged. The patient does have a history of chronic atrial fibrillation in light of this recent stroke. He was restarted on Coumadin about a week ago. The patient reports that he was doing well and then all of a sudden this morning at 5:00 a.m. developed severe substernal sharp chest pain. He took 3 nitroglycerin and 4 aspirins and his pain is now 2/10. He also had shortness of breath, became clammy and started sweating. He did not have any nausea, vomiting or diarrhea. Denies any urinary frequency, urgency or hesitancy. No lower extremity swelling or calf pain.   PAST MEDICAL HISTORY: 1. History of diastolic heart failure.  2. COPD.  3. Chronic atrial fibrillation, for which now he is on Coumadin.  4. Hypertension.  5. History of prostate cancer.  6. History of coronary artery disease, status post CABG in 2008. Multiple stents.  7. Carotid endarterectomy and TIA.  8. History of peripheral vascular disease.  9. Morbid obesity.  10. Obstructive sleep apnea.  11. Recent diagnosis of CVA, status post TPA.   PAST SURGICAL HISTORY: Status post CABG, carotid endarterectomy. Cardiac stents,  prostate surgery and LASIK surgery.   ALLERGIES: PREDNISONE.   SOCIAL HISTORY: Used to smoke, quit about 2 to 3 years ago. No alcohol or drug use.   FAMILY HISTORY: Father died from complications of throat and esophageal cancer.   CURRENT MEDICATIONS: Acetaminophen hydrocodone 325/5 q.4 p.r.n., acetaminophen, tramadol 325/37.5, 1 to 2 tabs as needed for joint pain. Albuterol ipratropium inhalation 4 times a day as needed for shortness of breath. Alprazolam 0.25, 1 tab p.o. q.6 p.r.n., codeine and guaifenesin 10 mL q.4 p.r.n., Crestor 20 daily, Colace 100, 1 tab p.o. b.i.d. Eligard 45 mg subcutaneous every six months, iron sulfate 325 mg 1 tab p.o. b.i.d., fish oil 1000 mg 1 tab p.o. b.i.d., fluticasone 0.1, 1 tab p.o. daily, Lasix 20 mg 1 tab p.o. daily as needed for swelling, Singulair 10 daily, morphine 0.25 mL q.4 p.r.n., Mucinex 600 mg 1 tab p.o. b.i.d., Nitrostat 0.4 sublingual as needed, Prilosec 20 daily, Robitussin 10 mL q.4 p.r.n., senna 1 tab p.o. daily, Spiriva 18 mcg daily, Symbicort 2 puffs b.i.d., tramadol 25, 1 tab p.o. b.i.d., Ventolin 2 puffs q.4 p.r.n., vitamin C 1,000 mg daily, warfarin 3 mg daily, Zoloft 50 daily.   REVIEW OF SYSTEMS:  CONSTITUTIONAL: Denies any fevers, chills. No weight loss. No weight gain.  HEENT: Denies any blurred vision, double vision. No erythema. No glaucoma. No cataracts. No epistaxis. No seasonal allergies. No difficulty swallowing. Denies any tinnitus, hearing loss. CARDIOVASCULAR: Complains of chest pain and shortness of breath. No palpitations. No syncope.  PULMONARY: Denies any cough, wheezing. No hemoptysis.  GASTROINTESTINAL: Denies any nausea, vomiting or diarrhea. No jaundice. No  hepatitis.  GENITOURINARY: Denies any frequency, urgency or hesitancy.  ENDOCRINE: Denies any polydypsia>  or polyuria.  SKIN: Denies any skin rashes.  PSYCHIATRIC: Anxious. Denies any anxiety or depression. NEUROLOGICAL:  Recent CVA, currently without any residual  deficits.  LYMPHATICS: Denies any lymph node enlargement.   PHYSICAL EXAMINATION: VITAL SIGNS: Temperature 97.7, pulse 71, respirations 18, blood pressure 126/76, O2 is 97%.  GENERAL: The patient is a well-developed male, currently not in any acute distress.   HEENT: Head atraumatic, normocephalic. Pupils equally round, reactive to light and accommodation. There is no conjunctival pallor. No scleral icterus.  OROPHARYNX: Clear.  NECK: Supple. No JVD. No thyroid enlargement or tenderness.  LUNGS: Clear to auscultation bilaterally without any rales, rhonchi, or wheezing.  CARDIOVASCULAR: S1, S2 positive. No murmurs, rubs, gallops.  ABDOMEN: Soft, obese, nontender, nondistended. Positive bowel sounds x4.  EXTREMITIES: No clubbing, cyanosis, or edema.  SKIN: No rash.  LYMPHATICS: No lymph nodes palpable.  VASCULAR: Good DP, PT pulses.  PSYCHIATRIC: Awake, alert, oriented x3. Not depressed or anxious.  MUSCULOSKELETAL: There is no erythema or swelling.  NEUROLOGIC: Cranial nerves II through XII grossly intact. Strength is intact.   EVALUATION: EKG shows atrial fibrillation.   LABORATORY DATA: Glucose 87, BUN 20, creatinine 1.44, sodium 139, potassium 3.5, chloride 110. His LFTs showed albumin of 3.0. CPK is 24. CK-MB less than 0.5. Troponin less than 0.02. WBC 10.1, hemoglobin 11.7, platelet count 192. INR 2.1.   ASSESSMENT AND PLAN: The patient is a 79 year old white male with history of coronary artery disease, multiple medical problems. Presents with chest pain.  1. Chest pain, possibly related to angina. At this time, we will check serial cardiac enzymes. Cardiology evaluation. The patient not on aspirin, which I will start. Further recommendations after cardiology evaluation.  2. Recent cerebrovascular accident status post TPA on Coumadin. His INR is therapeutic, which we will continue.  3. Atrial fibrillation. We will monitor him on telemetry, as well as continue amiodarone as taking at  home.  4. History of chronic obstructive pulmonary disease. Continue nebulizers p.r.n. as well as his inhalers as taking at home.  5. Hyperlipidemia. We will continue Crestor. Check a lipid panel in the morning.  6. History of diastolic congestive heart failure. Continue Aldactone and Lasix p.r.n. Currently, the patient is compensated.  7. Miscellaneous. The patient is already on on Coumadin for deep vein thrombosis prophylaxis.   TIME SPENT:  45 minutes spent.    ____________________________ Lafonda Mosses. Posey Pronto, MD shp:sg D: 05/06/2013 08:37:19 ET T: 05/06/2013 10:16:23 ET JOB#: [id number]  cc: Maritta Kief H. Posey Pronto, MD, <Dictator> Alric Seton MD ELECTRONICALLY SIGNED 05/07/2013 13:04

## 2014-12-09 NOTE — Consult Note (Signed)
General Aspect 79 year old male with h/o smoking 40 years,severe  COPD, frequent admissions for COPD exacerbations/bronchitis, coronary artery disease with bypass surgery in 2007, occlusion of 2 vein grafts with PCI x4, PVD with bilateral carotid endarterectomies in 2002, history of TIA in 2002, h/o prostate CA s/p brachytherapy, TIA,  admitted  for hypotension and chest pain.  Hospitalization in May 2013 chest pain, COPD exacerbation. Stress test showed no ischemia. Echocardiogram was essentially normal with mild MR. He was just discharge on Friday after he was treated for COPD, CHF and hematuria. He was sent to rehab. While there, he had an episode of hypotension associated with dizziness. After that he developed chest pain which partially responded to NTG and resolved after he recieved Morphine in ED.   Present Illness Prior Carotid u/s done in the hospital 01/12/2012 shows no significant carotid disease.   PAST SURGICAL HISTORY: CABG, carotid endarterectomy, cardiac stent, Lasik surgery on the eye.    SOCIAL HISTORY: He quit smoking 2 years ago. Denies any alcohol drinking or illicit drug use.   FAMILY HISTORY: Significant for diabetes and coronary artery disease.   Physical Exam:  GEN well developed, well nourished, critically ill appearing   HEENT PERRL, hearing intact to voice, moist oral mucosa, appears pale   NECK supple   RESP clear BS  no use of accessory muscles   CARD Irregular rate and rhythm  Murmur   Murmur Systolic   Systolic Murmur Out flow   ABD denies tenderness  soft   LYMPH negative axillae   EXTR negative edema   SKIN normal to palpation   NEURO motor/sensory function intact   PSYCH alert, A+O to time, place, person, good insight   Review of Systems:  Subjective/Chief Complaint hypotension followed by chest pain.   General: Fatigue  Weakness   Skin: No Complaints   ENT: No Complaints   Eyes: No Complaints   Neck: No Complaints    Respiratory: Short of breath   Cardiovascular: Tightness  Dyspnea   Gastrointestinal: No Complaints   Genitourinary: No Complaints   Vascular: No Complaints   Musculoskeletal: No Complaints   Neurologic: No Complaints   Hematologic: No Complaints   Endocrine: No Complaints   Psychiatric: No Complaints   Review of Systems: All other systems were reviewed and found to be negative   Medications/Allergies Reviewed Medications/Allergies reviewed   Lab Results: Routine Chem:  28-Apr-14 06:23   Result Comment CBC - SMEAR SCANNED  Result(s) reported on 14 Dec 2012 at 08:47AM.  Glucose, Serum 79  BUN  33  Creatinine (comp) 1.19  Sodium, Serum 139  Potassium, Serum 3.9  Chloride, Serum 102  CO2, Serum 29  Calcium (Total), Serum  8.2  Anion Gap 8  Osmolality (calc) 284  eGFR (African American) >60  eGFR (Non-African American)  60 (eGFR values <54m/min/1.73 m2 may be an indication of chronic kidney disease (CKD). Calculated eGFR is useful in patients with stable renal function. The eGFR calculation will not be reliable in acutely ill patients when serum creatinine is changing rapidly. It is not useful in  patients on dialysis. The eGFR calculation may not be applicable to patients at the low and high extremes of body sizes, pregnant women, and vegetarians.)  Cardiac:  28-Apr-14 06:23   CK, Total  13  CPK-MB, Serum 0.5 (Result(s) reported on 14 Dec 2012 at 06:58AM.)  Troponin I < 0.02 (0.00-0.05 0.05 ng/mL or less: NEGATIVE  Repeat testing in 3-6 hrs  if clinically indicated. >0.05  ng/mL: POTENTIAL  MYOCARDIAL INJURY. Repeat  testing in 3-6 hrs if  clinically indicated. NOTE: An increase or decrease  of 30% or more on serial  testing suggests a  clinically important change)  Routine Hem:  28-Apr-14 06:23   WBC (CBC) 7.2  RBC (CBC)  3.27  Hemoglobin (CBC)  9.6  Hematocrit (CBC)  28.7  Platelet Count (CBC) 243  MCV 88  MCH 29.2  MCHC 33.3  RDW  17.3   Neutrophil % 65.9  Lymphocyte % 22.3  Monocyte % 10.9  Eosinophil % 0.8  Basophil % 0.1  Neutrophil # 4.7  Lymphocyte # 1.6  Monocyte # 0.8  Eosinophil # 0.1  Basophil # 0.0   EKG:  Interpretation A-fib with controlled rate. No significant STTW changes.  Telemetry showing at atrial fibrillation, rates in the 90s    No Known Allergies:   Vital Signs/Nurse's Notes: **Vital Signs.:   28-Apr-14 07:44  Vital Signs Type Routine  Temperature Temperature (F) 98.4  Celsius 36.8  Pulse Pulse 78  Respirations Respirations 20  Systolic BP Systolic BP 952  Diastolic BP (mmHg) Diastolic BP (mmHg) 78  Mean BP 99  Pulse Ox % Pulse Ox % 96  Pulse Ox Activity Level  At rest  Oxygen Delivery 2L    Impression 79 year old male with h/o smoking 40 years, severe asymmetric LVH with moderate LVOT gradient,  COPD, coronary artery disease with bypass surgery in 2007, occlusion of 2 vein grafts with PCI x4, PVD with bilateral carotid endarterectomies in 2002, history of TIA in 2002, h/o prostate CA s/p brachytherapy who has presented several times to [facility] with COPD exacerbation,  TIA, episode of COPD exacerbation/bronchitis who was re-admitted for hypotension and chest pain.   Plan 1) Chest pain : I suspect is due to hypotension and coronary hypoperfusion. ECG without acute changes and enzymes are normal. Obviously, he does have underlying CAD. Given his multiple recent admissions and recent problems with hematuria, i will hold off on ischemic cardiac evaluation and attempt medical therapy with optimization of BP.  Started Aspirin 81 mg once daily to see if he could tolerate this.   2) Hypotension: on multiple BP meds. I stopped Coreg given that he is on Diltiazem. Continue Losartan.   3) Atrial fibrillation: Rate controlled. On Amiodarone (which can probably be stopped in future if this does not convert him) and Diltiazem. There will be continued.  No anticoagulation for now due to recent bleeding  complications.   The patient can likely be discharged home if no other issues.   Electronic Signatures: Kathlyn Sacramento (MD)  (Signed 28-Apr-14 10:20)  Authored: General Aspect/Present Illness, History and Physical Exam, Review of System, Labs, EKG , Allergies, Vital Signs/Nurse's Notes, Impression/Plan   Last Updated: 28-Apr-14 10:20 by Kathlyn Sacramento (MD)

## 2014-12-09 NOTE — Consult Note (Signed)
Patient seen, chart review, note dictated. Prostate cancer, urinary retention. He has developed urinary retention with the recent hospitalization and medical issues.  He has a history of prostate cancer status post prostate brachytherapy.  He is followed by Dr. Lucia Bitter in [facility].  He is on Eligard therapy for prostate cancer.  His PSAs have been around 0.2.  This indicates good prostate cancer control.  He has been on no alpha blockers.  He was having no significant voiding issues prior to this event.  He developed subsequent urinary retention with large bladder volumes.  He was catheterized recently with 1300 mL.  This was an in and out catheter.  His bladder scan today demonstrated an increase bladder volume.  Catheterization demonstrated 1400 mL.  This will lead to overdistention of the bladder with subsequent bladder dysfunction.  As a result, he will need to keep the Foley catheter for at least 7-10 days.  He will need to remain on Flomax 0.4 mg.  This was recently started.  He will need a followup for catheter removal and voiding trial.  Since he is under the care of of Dr. Lucia Bitter, he has elected to followup with Dr. Lucia Bitter interim for further evaluation from the standpoint.  He will likely need cystoscopy also at some point in the future to evaluate his bladder and prostate.  The alpha-blocker will likely be sufficient.  Okay to discharge with Foley catheter to gravity drainage with bedside and leg bag drainage.  Patient is comfortable in this situation after today's discussion.  If there are any further issues, please feel free to contact us.  Electronic Signatures: Murrell Redden (MD)  (Signed on 08-Apr-14 16:12)  Authored  Last Updated: 08-Apr-14 16:12 by Murrell Redden (MD)

## 2014-12-09 NOTE — Consult Note (Signed)
General Aspect 79 year old male with h/o smoking 40 years, frequent admissions for severe COPD exacerbations/bronchitis, chronic respiratory failure, CAD s/p CABG in 2007, occlusion of 2 VGs with PCI x4, persistent AF/flutter (off anticoagulation 2/2 hematuria), HFpEF, PVD with bilateral carotid endarterectomies in 2002, history of TIA in 2002, h/o prostate CA s/p brachytherapy, h/o GIB/hematuria and anemia who was He presents back to [facility] with syncope.   He was in his USOH (NYHA II-III symptoms, nocturnal O2 at baseline) until last night. He sat down to use the restroom. Stood up, washed his hands and proceeded to walk into the next room. The next thing he remembers is waking up 10 minutes later with EMS surrounding him. He reports losing consciousness. He had no premonitory symptoms- denies lightheadedness, palpitations, chest pain or SOB. He apparently called out to his wife before passing out.Marland Kitchen He did not land on his face or strike his head. He is not sore today. He did not palpitations c/w with a-fib, only more intense, upon waking. EMS informed him that his BP was low. He was not wearing a tightly fitted shirt. Did not occur immediately post-micturition. He was not wearing his compression stockings. He reports good fluid intake. Denies PND, orthopnea, LE edema or chest pain. Breathing was stable. Denied n/v/d or excessive diuretic use.  multiple recent admissions for chest pain, acute on chronic diastolic CHF and COPD.  Hospitalization in May 2013 chest pain, COPD exacerbation. Stress test showed no ischemia. Echocardiogram 11/18/12:  EF 55-60%, rhythm atrial flutter, mild LA dilatation, mildly dilated LA, mild TR, mildly elevated PASP. Rhythm noted to be atrial flutter. He has a history of hypotension and presyncope in the past secondary to hypovolemia.   Present Illness EKG revealed rate controlled a-fib without ST/T changes. Trop-I WNL x 2. CBC- WBC 12.3, Hgb 13/Hct 37.8. BMET- BUN 30/Cr 1.54. CXR  with pulmonary infiltrates c/w edema vs fibrosis vs infectious process. Orthostatic VS positive in the ED- SBP 109 laying->71 standing. U/a unremarkable. Noncontrast head CT w/o acute abnormalities. Cardiology has been consulted for syncope.   Prior Carotid u/s done in the hospital 01/12/2012 shows no significant carotid disease.   PAST SURGICAL HISTORY: CABG, carotid endarterectomy, cardiac stent, Lasik surgery on the eye.    SOCIAL HISTORY: He quit smoking 2 years ago. Denies any alcohol drinking or illicit drug use.   FAMILY HISTORY: Significant for diabetes and coronary artery disease.   Physical Exam:  GEN no acute distress, obese   HEENT pink conjunctivae, PERRL, hearing intact to voice   NECK supple  No masses  trachea midline  no appreciable bruits   RESP normal resp effort  no use of accessory muscles  diffuse centralized rhonchi, most prominent in the RLL field with associated wheezing   CARD Irregular rate and rhythm  Normal, S1, S2  No murmur   ABD denies tenderness  soft  normal BS   EXTR negative cyanosis/clubbing, negative edema   SKIN normal to palpation, skin turgor good   NEURO follows commands, motor/sensory function intact   PSYCH alert, A+O to time, place, person   Review of Systems:  Subjective/Chief Complaint dizziness, syncope   General: No Complaints   Skin: No Complaints   ENT: No Complaints   Eyes: No Complaints   Neck: No Complaints   Respiratory: No Complaints   Cardiovascular: No Complaints  Palpitations   Gastrointestinal: No Complaints   Genitourinary: No Complaints   Vascular: No Complaints   Musculoskeletal: No Complaints   Neurologic: Dizzness  Fainting   Hematologic: No Complaints   Endocrine: No Complaints   Psychiatric: No Complaints   Review of Systems: All other systems were reviewed and found to be negative   Medications/Allergies Reviewed Medications/Allergies reviewed     Anemia, Chronic:     CVA/Stroke:    MI - Myocardial Infarct:    prostate cancer with brachytherapy:    prostate cancer with bradytherapy:    peripheral vascular disease:    HTN:    CAD:    COPD:    Bronchitis:    High cholesterol:    angina:    triple bypass:    lasik surgery both eyes:    4 cardiac stents:    Carotid Endarterectomy:   Home Medications: Medication Instructions Status  albuterol-ipratropium 2.5 mg-0.5 mg/3 mL inhalation solution  inhaled for 4 days then stop  Active  acetaminophen-HYDROcodone 325 mg-5 mg oral tablet 1 tab(s) orally every 4 hours, As Needed - for Pain Active  morphine 20 mg/mL oral concentrate 0.25 milliliter(s) orally every 4 hours, As Needed - for Pain Active  amiodarone 200 mg oral tablet 1 tab(s) orally 2 times a day  Active  diltiazem 120 milligram(s) orally once a day Active  Symbicort 160 mcg-4.5 mcg/inh inhalation aerosol 2 puff(s) inhaled 2 times a day (0900, 1800) Active  Eligard 45 mg/6 months subcutaneous injection, extended release 45 milligram(s) subcutaneous every 6 months on the first of April and October (1000) Active  ferrous sulfate 325 mg oral tablet 1 tab(s) orally 2 times a day (0900, 1800) Active  docusate sodium sodium 100 mg oral capsule 1 cap(s) orally 2 times a day (0800, 1800) Active  Spiriva 18 mcg inhalation capsule 1 each inhaled once a day (0900) Active  ascorbic acid 500 mg oral tablet 2 tab(s) (1000 mg) orally once a day (0900) Active  Os-Cal 500 + D 500 mg-400 intl units oral tablet, chewable 1 tab(s) orally 2 times a day (0900, 1800) Active  Crestor 20 mg oral tablet 1 tab(s) orally once a day (at bedtime) (2100) Active  Fish Oil 500 mg oral capsule 2 cap(s) (1000 mg) orally once a day (0900) Active  losartan 25 mg oral tablet 1 tab(s) orally once a day (0900) Active  montelukast 10 mg oral tablet 1 tab(s) orally once a day (at bedtime) (2100) Active  omeprazole 40 mg oral delayed release capsule 1 cap(s) orally 2 times a  day (0630, 1630) Active  Senna 8.6 mg oral tablet 1 tab(s) orally once a day (0900) Active  sertraline 50 mg oral tablet 1 tab(s) orally once a day (0900) Active  multivitamin 1 tab(s) orally once a day (0900) Active  levalbuterol 1.25 mg/3 mL inhalation solution 3 milliliter(s) (1 vial) inhaled every 4 hours, As Needed - for Shortness of Breath Active  ALPRAZolam 0.25 mg oral tablet 1 tab(s) orally every 6 hours, As Needed for anxiety Active  furosemide 20 mg oral tablet 1 tab(s) orally once a day, As Needed for weight gain >3 lbs in 24 hours and 4 lbs in 7 days, or if edema worsens or severe shortness of breath Active  Ventolin HFA 90 mcg/inh inhalation aerosol 2 puff(s) inhaled every 4 hours, As Needed - for Shortness of Breath Active  potassium chloride 10 mEq oral tablet, extended release 1 tab(s) orally , As Needed if lasix is given Active  Aldactone 25 mg oral tablet 1 tab(s) orally once a day Active  primidone 50 mg oral tablet 1 tab(s) orally once a  day Active   Lab Results:  Routine Chem:  30-Jul-14 03:24   BUN  30  Creatinine (comp)  1.54  Cardiac:  30-Jul-14 03:24   Troponin I < 0.02 (0.00-0.05 0.05 ng/mL or less: NEGATIVE  Repeat testing in 3-6 hrs  if clinically indicated. >0.05 ng/mL: POTENTIAL  MYOCARDIAL INJURY. Repeat  testing in 3-6 hrs if  clinically indicated. NOTE: An increase or decrease  of 30% or more on serial  testing suggests a  clinically important change)  CK, Total  20  CPK-MB, Serum  < 0.5 (Result(s) reported on 17 Mar 2013 at 04:12AM.)    14:47   Troponin I < 0.02 (0.00-0.05 0.05 ng/mL or less: NEGATIVE  Repeat testing in 3-6 hrs  if clinically indicated. >0.05 ng/mL: POTENTIAL  MYOCARDIAL INJURY. Repeat  testing in 3-6 hrs if  clinically indicated. NOTE: An increase or decrease  of 30% or more on serial  testing suggests a  clinically important change)   EKG:  Interpretation EKG shows atrial fibrillation, LAD, IVCD III, aVF   Rate  87   EKG Comparision Not changed from  11/2012 tracing   Radiology Results: XRay:    30-Jul-14 05:49, Chest PA and Lateral  Chest PA and Lateral   REASON FOR EXAM:    syncope eval  COMMENTS:       PROCEDURE: DXR - DXR CHEST PA (OR AP) AND LATERAL  - Mar 17 2013  5:49AM     RESULT:     Comparison is made to a prior study dated 02/05/2013.    Technique:  The patient has taken a shallow inspiration. With technique   taken into consideration, there is prominence of the interstitial   markings and mild peribronchial cuffing. Thickening versus fluid is   identified along the minor fissure. The cardiac silhouette is enlarged   indicative of cardiomegaly. Artifact is appreciated along the midline of   the chest. The visualized bony skeleton is unremarkable.  IMPRESSION:  Interstitial infiltrate likely representing a component of   pulmonary edema. Underlying component of pulmonary fibrosis is also of   diagnostic consideration. An infectious or inflammatory infiltrate cannot   be excluded and repeat evaluation and surveillance is recommended.      Thank you for this opportunity to contribute to the care of your patient.         Verified By: Mikki Santee, M.D., MD    Prednisone: Unknown  Vital Signs/Nurse's Notes: **Vital Signs.:   30-Jul-14 14:28  Vital Signs Type Routine  Temperature Temperature (F) 96.1  Celsius 35.6  Temperature Source AdultAxillary  Pulse Pulse 70  Respirations Respirations 20  Systolic BP Systolic BP 782  Diastolic BP (mmHg) Diastolic BP (mmHg) 91  Mean BP 109  Pulse Ox % Pulse Ox % 98  Pulse Ox Activity Level  At rest  Oxygen Delivery Room Air/ 21 %    Impression 79 year old male with h/o smoking 40 years, frequent admissions for severe COPD exacerbations/bronchitis, chronic respiratory failure, CAD s/p CABG in 2007, occlusion of 2 VGs with PCI x4, persistent AF/flutter (off anticoagulation 2/2 hematuria), HFpEF, PVD with bilateral carotid  endarterectomies in 2002, history of TIA in 2002, h/o prostate CA s/p brachytherapy, h/o GIB/hematuria and anemia who was admitted for syncope.  1. Syncope . Orthostatic vitals were markedly positive in the ED.  Elevated creatinine compared with lasix week on previous discharge Suspect dehydration primary etiology   -- Add compression stockings, encourage adherence -- Will need to be careful with  hydration in the outpatient setting with his underlying chronic diastolic CHF  Would hold aldactone Encourage po fluids at home  2. Chronic diastolic CHF Euvolemic on exam. EF 55-60% in April of this year.  -- Continue Lasix only PRN for weight increase/volume overload as outpatient -- Would hold aldactone  3. Persistent atrial fibrillation Rate-controlled. Not a candidate for anticoagulation due to prior GIB, nosebleeds, anemia.  -- Continue diltiazem as BP allows --Monitor on tele  4 .CAD s/p CABG, multiple PCIs No chest pain. EKG w/o ischemic changes. Trop-I WNL x 2.  -- Continue ASA and monitor for bleeding   Plan 5. Acute on CKD, stage III Suspect renal hypoperfusion with orthostatic hypotension/syncopal episode yesterday and dehydration.  -- Continue to monitor with gentle hydration  6. COPD/chronic respiratory failure Trace wheezing and rhonchi appreciated on exam. Breathing stable. O2 sat 97-98% on RA. Mild leukocytosis. CXR with interstitial changes concerning for edema, fibrosis or infection. No cough, fevers or chills. -- Continue breathing treatments, supplemental O2, outpatient COPD management   Electronic Signatures: Meriel Pica (PA-C)  (Signed 30-Jul-14 17:57)  Authored: General Aspect/Present Illness, History and Physical Exam, Review of System, Past Medical History, Home Medications, Labs, EKG , Allergies, Vital Signs/Nurse's Notes, Impression/Plan Ida Rogue (MD)  (Signed 30-Jul-14 19:50)  Authored: General Aspect/Present Illness, Review of System, EKG ,  Radiology, Impression/Plan  Co-Signer: General Aspect/Present Illness, History and Physical Exam, Review of System, Past Medical History, Home Medications, Labs, EKG , Allergies, Vital Signs/Nurse's Notes, Impression/Plan   Last Updated: 30-Jul-14 19:50 by Ida Rogue (MD)

## 2014-12-09 NOTE — Consult Note (Signed)
General Aspect 78 year old male with h/o smoking 40 years, severe asymmetric LVH with moderate LVOT gradient,  COPD, coronary artery disease with bypass surgery in 2007, occlusion of 2 vein grafts with PCI x4, PVD with bilateral carotid endarterectomies in 2002, history of TIA in 2002, h/o prostate CA s/p brachytherapy who has presented several times to [facility] with COPD exacerbation,  TIA, episode of COPD exacerbation/bronchitis who was re-admitted yesterday for hematuria after recent discharge for new onset a flutter s/p DCCV, amiodarone, diltiazem and Xarelto.   Recent EGD and colonoscopy for guaiac positive stools, by Dr. Vira Agar with bx and caudery by report.   Prior echo with asymmetric LVH severe, diastolic relaxation abnormality, normal systolic function, mild MR significant chordal SAM with LVOT gradient of 3 m/s and gradient of 35 mm of mercury and increases to gradient of 49 with Valsalva.    He has frequent COPD exacerbations, often requiring hospitalization often needing long courses of antibiotics and steroids.  Hospitalization in May 2013 chest pain, COPD exacerbation. Stress test showed no ischemia. Echocardiogram was essentially normal with mild MR. received significant IV fluids through his hospital course. Edema got worse through the next week with significant pain in his legs. This improved on outpatient diuretics. Lasix held periodically for low blood pressure.   Present Illness He reports having recent admission to the hospital 08/31/2012 for shortness of breath. Treated for COPD exacerbation, started on Levaquin IV steroids. CT scan of the chest showing stable pulmonary nodule. No recent chest pain. Edema has improved but still with mild swelling. He is currently taking Lasix daily, but not on a regular basis.  Episode of shortness of breath and chest pain that took him to [facility] 3 yrs ago where he had a cardiac catheterization showing patent vessels w/o significant progression  of his disease.   He was previously found to be resistant to Plavix by P2Y12 assay at [facility].  He was discharged just yesterday for new onset atrial flutter s/p DCCV. Placed on amiodarone, diltiazem and Xarelto. He felt well initially, but began having gross hematuria, increased DOE/SOB, LE edema and returned the [facility] ED. There, he filled two urinals full of gross hematuria which gradually resolved. Leukocytosis was evident on CBC and ED provider noted a tender prostate. He was started on Rocephin for prostatitis. H/H 11.9/37.1, WBC 24.5. CXR with low-grade compensated CHF. Lasix 40 mg IV x 2 with I/O - 300. Weight 250 lbs yesterday, 254 lbs on office visit last month.   Prior Carotid u/s done in the hospital 01/12/2012 shows no significant carotid disease.   PAST SURGICAL HISTORY: CABG, carotid endarterectomy, cardiac stent, Lasik surgery on the eye.    SOCIAL HISTORY: He quit smoking 2 years ago. Denies any alcohol drinking or illicit drug use.   FAMILY HISTORY: Significant for diabetes and coronary artery disease.   Physical Exam:  GEN well developed, well nourished, no acute distress, obese   HEENT red conjunctivae, PERRL, hearing intact to voice   NECK supple  No masses  redundant neck tissue, left carotid bruit   RESP postive use of accessory muscles  rhonchi  crackles  bibasilar rales   CARD Regular rate and rhythm  Normal, S1, S2  Murmur  II/VI systolic murmur at LUSB/LLSB   Murmur Systolic   ABD denies tenderness  distended  hypoactive BS   LYMPH negative neck   EXTR negative edema, 2+ bilateral pedal edema   SKIN normal to palpation   NEURO motor/sensory function intact  PSYCH alert, A+O to time, place, person, good insight   Review of Systems:  Subjective/Chief Complaint blood in urine, shortness of breath, swelling   General: Fatigue  Weakness   Respiratory: Short of breath   Cardiovascular: Dyspnea  Edema   Genitourinary: Hematuria     Anemia, Chronic:     CVA/Stroke:    MI - Myocardial Infarct:    prostate cancer with brachytherapy:    prostate cancer with bradytherapy:    peripheral vascular disease:    HTN:    CAD:    COPD:    Bronchitis:    High cholesterol:    angina:    triple bypass:    lasik surgery both eyes:    4 cardiac stents:    Carotid Endarterectomy:     cefTRIAXone injection,  ( Rocephin injection )  1 gram, IV Piggyback, once, Infuse over 30 minute(s)  Indication: Infection, 26-Nov-2012, Completed, Standard   Acetaminophen * tablet, ( Tylenol (325 mg) tablet)  650 mg Oral q4h PRN for pain or temp. greater than 100.4  - Indication: Pain/Fever, 26-Nov-2012, Active, Standard   Furosemide injection, ( Lasix injection )  40 mg, IV push, bid  Indication: Diuresis, 26-Nov-2012, Active, Standard   Ondansetron injection, ( Zofran injection )  4 mg, IV push, q4h PRN for Nausea/Vomiting  Indication: Nausea/ Vomiting, 26-Nov-2012, Active, Standard   Pantoprazole tablet, 40 mg Oral q6am  - Indication: Erosive Esophagitis/ GERD  Instructions:  DO NOT CRUSH, 26-Nov-2012, Discontinued, Standard   Acetaminophen-traMADOL 325/37.5 mg tablet, ( Acetamin 321m-traMADOL 37.5)  1 tablet(s) Oral q6h PRN for pain, 26-Nov-2012, Active, Standard   Losartan tablet, ( Cozaar)  50 mg Oral daily  - Indication: Antihypertensive, 26-Nov-2012, Active, Standard   PredniSONE tablet, ( Deltasone)  60 mg Oral daily  - Indication: Inflammation, 26-Nov-2012, Active, Standard   Tamsulosin capsule, ( Flomax)  0.4 mg Oral daily after meal  - Indication: Symptoms of Benign Prostatic Hyperplasma, 26-Nov-2012, Active, Standard   Amiodarone tablet, ( Pacerone)  200 mg Oral bid  - Indication: Tachycardia/ Atrial Fibrillation, 26-Nov-2012, Active, Standard   Diltiazem ER capsule, ( Cardizem CD)  300 mg Oral daily  - Indication: Angina/ Hypertension  Instructions:  - DO NOT CRUSH, 26-Nov-2012, Active, Standard    Isosorbide Mononitrate SA tablet, ( Imdur)  30 mg Oral daily  - Indication: Angina  Instructions:  DO NOT CRUSH, 26-Nov-2012, Active, Standard   Rosuvastatin Tablet, ( Crestor)  20 mg Oral at bedtime  - Indication: Hyperlipidemia, 26-Nov-2012, Active, Standard   Calcium Carb 5020mVit D 200unit tablet, 1 tablet(s) Oral bid/wm  Instructions:  Contains (50010mlemental Calcium) in 1250m16mlcium Carbonate + 200mg36mamin D, 26-Nov-2012, Active, Standard   Ferrous Sulfate tablet, ( FeSO4)  325 mg Oral bid/wm  - Indication: Iron Deficiency/ Anemia, 26-Nov-2012, Active, Standard   Vitamin B Comp-Vit E-Zinc tablet, ( Z-Bec)  1 tablet(s) Oral daily  - Indication: Prevention and Treatment of Vitamin Deficiencies, 26-Nov-2012, Active, Standard   Montelukast tablet, 10 mg Oral daily  - Indication: Bronchodilator/ Chronic Asthma, 26-Nov-2012, Active, Standard   Omega 3 Fatty Acid 1 Gram Capsule, ( Lovaza)  1 gram Oral daily, 26-Nov-2012, Active, Standard   Omeprazole capsule, ( PriLOSEC)  20 mg Oral bid-PPI  - Indication: GERD  Instructions:  DO NOT CRUSH, 26-Nov-2012, Active, Standard   guaiFENesin LA tablet,  ( Mucinex)  600 mg Oral q12h  - Indication: Cough  Instructions:  DO NOT CRUSH, 27-Nov-2012, Active, Standard   cefTRIAXone injection, ( Rocephin  injection )  1 gram, IV Piggyback, q24h, Infuse over 30 minute(s)  Indication: Infection, First dose given at 2028 11/26/2012.Marland KitchenMarland KitchenThen pharmacy to dose, 27-Nov-2012, Active, Standard  Home Medications: Medication Instructions Status  amoxicillin-clavulanate 875 mg-125 mg oral tablet 1 tab(s) orally every 12 hours Active  predniSONE 20 mg oral tablet 2 tab daily for 2 days,1 tab daily for 2 days Active  losartan 50 mg oral tablet 1 tab(s) orally once a day Active  tamsulosin 0.4 mg oral capsule 1 cap(s) orally once a day (after a meal) Active  amiodarone 200 mg oral tablet 1 tab(s) orally 2 times a day Active  diltiazem 300 mg/24  hours oral capsule, extended release 1 cap(s) orally once a day Active  rivaroxaban 20 mg oral tablet 1 tab(s) orally once a day (at bedtime) Active  nystatin 100,000 units/mL oral suspension 5 milliliter(s) orally every 12 hours give 100 ml Active  carvedilol 6.25 mg oral tablet 1 tab(s) orally 2 times a day Active  Symbicort 160 mcg-4.5 mcg/inh inhalation aerosol 2 puff(s) inhaled 2 times a day Active  alprazolam 0.25 mg oral tablet 1 tab(s) orally every 6 hours as needed for anxiety Active  Centrum Silver oral tablet 1 tab(s) orally once a day (in the morning) Active  Vitamin C 1000 mg oral tablet 1 tab(s) orally once a day (in the morning) Active  Fish Oil 1000 mg oral capsule 1 cap(s) orally 2 times a day (morning and evening). Active  Crestor 20 mg oral tablet 1 tab(s) orally once a day (in the evening) Active  Ventolin HFA 90 mcg/inh inhalation aerosol 2 puff(s) inhaled 4 times a day as needed for shortness of breath Active  albuterol-ipratropium 2.5 mg-0.5 mg/3 mL inhalation solution 3 milliliter(s) inhaled 4 times a day, As Needed- for Shortness of Breath , for Wheezing  Active  furosemide 20 mg oral tablet 1 tab(s) orally once a day as needed for swelling Active  acetaminophen-tramadol 325 mg-37.5 mg oral tablet 1 tab(s) orally 1 to 2 times a day as needed for joint pain Active  Mucinex 600 mg oral tablet, extended release 1 tab(s) orally 2 times a day as needed for congestion Active  isosorbide mononitrate 30 mg oral tablet, extended release 1 tab(s) orally once a day (in the evening) Active  omeprazole 20 mg oral delayed release tablet 1 tab(s) orally once a day (in the morning) Active  acetaminophen-hydrocodone 325 mg-5 mg oral tablet 1 tab(s) orally every 4 hours, As Needed for severe pain from coughing Active  montelukast 10 mg oral tablet 1 tab(s) orally once a day (in the evening) Active  docusate sodium 100 mg oral tablet 1 tab(s) orally 2 times a day (morning and evening) Active   Os-Cal 500 + D 500 mg-400 intl units oral tablet, chewable 1 tab(s) orally 2 times a day (morning and evening) Active  Spiriva 18 mcg inhalation capsule 1 each inhaled once a day Active  Robitussin DM Sugar Free 10 mg-100 mg/5 mL oral liquid 5 milliliter(s) orally every 4 hours, As Needed for cough Active  Eligard 45 mg/6 months subcutaneous injection, extended release 45 milligram(s) subcutaneous every 6 months Active  Nicotrol Inhaler 10 mg inhalation device 10 milligram(s) inhaled every 2 hours, As Needed for smoking cessation Active  ferrous sulfate 325 mg oral tablet 1 tab(s) orally 2 times a day Active  Nitrostat 0.4 mg sublingual tablet 1 tab(s) sublingual every 5 minutes, As Needed as needed for chest pain if no relief call md or  go to emergency room **maximum 3 tablets** Active   Lab Results:  Hepatic:  10-Apr-14 16:34   Bilirubin, Total 0.4  Alkaline Phosphatase  44  SGPT (ALT) 54  SGOT (AST) 17  Total Protein, Serum  6.0  Albumin, Serum  2.8  Routine Micro:  10-Apr-14 20:06   Micro Text Report BLOOD CULTURE   COMMENT                   NO GROWTH IN 8-12 HOURS   ANTIBIOTIC                       Micro Text Report BLOOD CULTURE   COMMENT                   NO GROWTH IN 8-12 HOURS   ANTIBIOTIC                       Culture Comment NO GROWTH IN 8-12 HOURS  Result(s) reported on 27 Nov 2012 at 07:40AM.  Culture Comment NO GROWTH IN 8-12 HOURS  Result(s) reported on 27 Nov 2012 at 07:41AM.  Routine Chem:  10-Apr-14 16:34   Glucose, Serum  215  BUN  42  Creatinine (comp) 1.26  Sodium, Serum  135  Potassium, Serum 4.9  Chloride, Serum 101  CO2, Serum 27  Calcium (Total), Serum  7.7  Anion Gap 7  Osmolality (calc) 287  eGFR (African American) >60  eGFR (Non-African American)  56 (eGFR values <67m/min/1.73 m2 may be an indication of chronic kidney disease (CKD). Calculated eGFR is useful in patients with stable renal function. The eGFR calculation will not be  reliable in acutely ill patients when serum creatinine is changing rapidly. It is not useful in  patients on dialysis. The eGFR calculation may not be applicable to patients at the low and high extremes of body sizes, pregnant women, and vegetarians.)  11-Apr-14 04:18   Glucose, Serum  163  BUN  41  Creatinine (comp) 1.11  Sodium, Serum  135  Potassium, Serum 4.6  Chloride, Serum 101  CO2, Serum 28  Calcium (Total), Serum  7.8  Anion Gap  6  Osmolality (calc) 284  eGFR (African American) >60  eGFR (Non-African American) >60 (eGFR values <636mmin/1.73 m2 may be an indication of chronic kidney disease (CKD). Calculated eGFR is useful in patients with stable renal function. The eGFR calculation will not be reliable in acutely ill patients when serum creatinine is changing rapidly. It is not useful in  patients on dialysis. The eGFR calculation may not be applicable to patients at the low and high extremes of body sizes, pregnant women, and vegetarians.)  Routine UA:  10-Apr-14 16:34   Color (UA) Red  Clarity (UA) Cloudy  Glucose (UA) Negative  Bilirubin (UA) Negative  Ketones (UA) Negative  Specific Gravity (UA) 1.025  Blood (UA) 3+  pH (UA) 6.0  Protein (UA) 100 mg/dL  Nitrite (UA) Negative  Leukocyte Esterase (UA) Negative (Result(s) reported on 26 Nov 2012 at 05:24PM.)  RBC (UA) 5823 /HPF  WBC (UA) 18 /HPF  Bacteria (UA) NONE SEEN  Epithelial Cells (UA) NONE SEEN  Result(s) reported on 26 Nov 2012 at 05:24PM.  Routine Coag:  10-Apr-14 16:34   Activated PTT (APTT) 24.8 (A HCT value >55% may artifactually increase the APTT. In one study, the increase was an average of 19%. Reference: "Effect on Routine and Special Coagulation Testing Values of Citrate Anticoagulant Adjustment in  Patients with High HCT Values." American Journal of Clinical Pathology 3382;505:397-673.)  Prothrombin  15.8  INR 1.3 (INR reference interval applies to patients on anticoagulant therapy. A  single INR therapeutic range for coumarins is not optimal for all indications; however, the suggested range for most indications is 2.0 - 3.0. Exceptions to the INR Reference Range may include: Prosthetic heart valves, acute myocardial infarction, prevention of myocardial infarction, and combinations of aspirin and anticoagulant. The need for a higher or lower target INR must be assessed individually. Reference: The Pharmacology and Management of the Vitamin K  antagonists: the seventh ACCP Conference on Antithrombotic and Thrombolytic Therapy. ALPFX.9024 Sept:126 (3suppl): [id number]. A HCT value >55% may artifactually increase the PT.  In one study,  the increase was an average of 25%. Reference:  "Effect on Routine and Special Coagulation Testing Values of Citrate Anticoagulant Adjustment in Patients with High HCT Values." American Journal of Clinical Pathology 2006;126:400-405.)  Routine Hem:  10-Apr-14 16:34   WBC (CBC)  24.5  RBC (CBC)  4.15  Hemoglobin (CBC)  11.9  Hematocrit (CBC)  37.1  Platelet Count (CBC) 191 (Result(s) reported on 26 Nov 2012 at 05:03PM.)  MCV 89  MCH 28.6  MCHC 32.0  RDW  16.7  11-Apr-14 04:18   WBC (CBC)  18.0  RBC (CBC)  4.09  Hemoglobin (CBC)  11.9  Hematocrit (CBC)  36.0  Platelet Count (CBC) 179  MCV 88  MCH 29.2  MCHC 33.1  RDW  16.0  Neutrophil % 88.3  Lymphocyte % 5.5  Monocyte % 6.1  Eosinophil % 0.0  Basophil % 0.1  Neutrophil #  15.9  Lymphocyte # 1.0  Monocyte #  1.1  Eosinophil # 0.0  Basophil # 0.0 (Result(s) reported on 27 Nov 2012 at 05:10AM.)   EKG:  Interpretation sinus bradycardia, 54 bpm, poor R wave progression   Radiology Results: XRay:    10-Apr-14 18:39, Chest Portable Single View  Chest Portable Single View   REASON FOR EXAM:    cough  COMMENTS:       PROCEDURE: DXR - DXR PORTABLE CHEST SINGLE VIEW  - Nov 26 2012  6:39PM     RESULT: Comparison is made to the study of November 17, 2012.    The lungs are  adequately inflated. The interstitial markings are mildly  prominent though stable. The cardiac silhouette is top normal in size.   The patient has undergone previous CABG. There is no pleural effusion or   alveolar infiltrate.    IMPRESSION:  The findings suggest low-grade compensated CHF. There has   notbeen significant interval change in the appearance of the chest since   the earlier study.   Dictation Site: 5        Verified By: DAVID A. Martinique, M.D., MD    No Known Allergies:   Vital Signs/Nurse's Notes: **Vital Signs.:   11-Apr-14 04:08  Vital Signs Type Routine  Temperature Temperature (F) 97.8  Celsius 36.5  Temperature Source oral  Pulse Pulse 70  Respirations Respirations 18  Systolic BP Systolic BP 097  Diastolic BP (mmHg) Diastolic BP (mmHg) 79  Mean BP 104  Pulse Ox % Pulse Ox % 96  Pulse Ox Activity Level  At rest  Oxygen Delivery Room Air/ 21 %    07:36  Vital Signs Type Routine  Temperature Temperature (F) 98  Celsius 36.6  Temperature Source tympanic  Pulse Pulse 69  Respirations Respirations 20  Systolic BP Systolic BP 353  Diastolic BP (mmHg)  Diastolic BP (mmHg) 72  Mean BP 100  Pulse Ox % Pulse Ox % 98  Pulse Ox Activity Level  At rest  Oxygen Delivery 2L    Impression 79 year old male with h/o smoking 40 years, severe asymmetric LVH with moderate LVOT gradient,  COPD, coronary artery disease with bypass surgery in 2007, occlusion of 2 vein grafts with PCI x4, PVD with bilateral carotid endarterectomies in 2002, history of TIA in 2002, h/o prostate CA s/p brachytherapy who has presented several times to [facility] with COPD exacerbation,  TIA, episode of COPD exacerbation/bronchitis who was re-admitted yesterday for hematuria after recent discharge for new onset a flutter s/p DCCV, amiodarone, diltiazem and Xarelto.   Plan 1. Acute on chronic respiratory failure 2. Acute on chronic diastolic CHF 3. Acute on COPD 4. Hematuria 5. Prostatitis 6. Atrial  flutter 7. CAD s/p CABG, PCI 8. Carotid artery disease 9. H/o TIA 10. H/o prostate CA s/p brachytherapy 11. Abdominal tenderness  Patient with quick re-admission for hematuria and acutely worsening dyspnea and LE edema. Discharge meds only included Lasix 51m PRN. Likely re-accumulated fluid. Will need scheduled Lasix. Weight actually improved from last office visit last month. Has diuresed ~ 300 mL. CXR does indicate mild CHF. Continue Lasix 424mBID today, then transition to Lasix 4062mO daily beginning tomorrow. Also a component of COPD contributing. On prednisone which may be attributing as well. Will check a BNP. Continue nebs. Xarelto will be continued 20 mg initially as H/H has remained stable. No evidence of hematuria on inspecting Foley today. May be able to increase to 20 mg as renal function stable. May be secondary to prostatitis/traumatic Foley pull. Maintaining NSR, 60-70 bpm on telemetry. Reduce diltiazem to 240 mg daily. Obtain BMET tomorrow. Monitor I/Os, daily weights. Place compression stockings to aide diuresis. Reduce amiodarone to 200m77mily. Consider abdominal u/s for epigastric tenderness on palpation.   Electronic Signatures for Addendum Section:  AridKathlyn [place]) (Signed Addendum 11-Apr-14 17:11)  The patient was seen and examined. Agree with above. Recent cardioversion for atrial flutter. He seems to be significantly fluid overloaded. Hematuria seems to be improving.  He is at high risk for stroke without anticoagulation given his recent cardioversion. Will resume Xarelto. Monitor hematuria and Hgb. Continue IV Lasix.   Electronic Signatures: ArguMeriel Pica-C)  (Signed 11-Apr-14 09:04)  Authored: General Aspect/Present Illness, History and Physical Exam, Review of System, Past Medical History, Orders, Home Medications, Labs, EKG , Radiology, Allergies, Vital Signs/Nurse's Notes, Impression/Plan AridKathlyn [place])  (Signed 11-Apr-14 17:11)  Co-Signer:  General Aspect/Present Illness, History and Physical Exam, Home Medications, Allergies, Impression/Plan   Last Updated: 11-Apr-14 17:11 by AridKathlyn [place])

## 2014-12-09 NOTE — H&P (Signed)
PATIENT NAME:  Javier [name], Javier [name] MR#:  [id number] DATE OF BIRTH:  18-May-1936  DATE OF ADMISSION:  11/17/2012  NO DICTATION  ____________________________ Ceasar Lund. Anselm Jungling, MD vgv:cc D: 11/17/2012 17:45:25 ET T: 11/17/2012 19:10:29 ET JOB#: [id number]  cc: Ceasar Lund. Anselm Jungling, MD, <Dictator> Vaughan Basta MD ELECTRONICALLY SIGNED 11/29/2012 22:21

## 2014-12-09 NOTE — Discharge Summary (Signed)
PATIENT NAME:  Javier [name], FALLEN MR#:  [id number] DATE OF BIRTH:  1936/01/05  DATE OF ADMISSION:  02/06/2013  DATE OF DISCHARGE:  02/07/2013  For detailed note, please see the History and Physical done on admission by Dr. Lenore Manner.   DIAGNOSES AT DISCHARGE ARE AS FOLLOWS: 1.  Orthostatic hypotension, now resolved.  2.  Acute renal failure secondary to orthostatic hypotension, also much improved. 3.  Hypertension. 4.  Chronic obstructive pulmonary disease. 5.  History of coronary artery disease. 6.  Hyperlipidemia. 7.  History of chronic atrial fibrillation.   DIET:  The patient is being discharged on a low-sodium, low-fat diet.   ACTIVITY:  As tolerated.   FOLLOW UP:  With Dr. Mila Palmer in the next 1 to 2 weeks. Also follow up with Dr. Ida Rogue in the next 1 to 2 weeks.  DISCHARGE MEDICATIONS:  Symbicort 160/4.5, 2 puffs b.i.d. Iron sulfate 325 mg b.i.d. Eligard 45 mg subcu every 6 months. Colace 100 mg b.i.d.  Spiriva 1 puff daily. Vitamin C 500 mg 2 tabs daily. Os-Cal with vitamin D 1 tab b.i.d. Crestor 20 mg daily. Fish oil 500 mg 2 tabs daily. Losartan 25 mg daily. Singular 10 mg daily. Omeprazole 40 mg b.i.d. Senokot 1 tab daily. Sertraline 50 mg daily. Multivitamin daily. Xopenex nebulizers every 4 hours as needed. Xanax 0.25 mg q. 6 hours as needed. Lasix 20 mg daily as needed for a 3-pound weight gain in 24 hours or a 4-pound weight gain in 7 days. Albuterol inhaler 2 puffs q. 4 hours as needed. Roxanol 0.25 mL q. 4 hours as needed. Tylenol with hydrocodone 5/325, 1 tab q. 4 hours as needed. Amiodarone 200 mg b.i.d. DuoNeb q.i.d. as needed. Diltiazem CD 120 mg daily. Potassium 10 mEq daily as needed with Lasix. Aldactone 25 mg daily.   PERTINENT STUDIES DONE DURING HOSPITAL COURSE:  A chest x-ray done on admission showing no evidence of any acute cardiopulmonary disease.   BRIEF HOSPITAL COURSE:  This is a 79 year old male with medical problems as mentioned above, presented  to the hospital with generalized weakness and noted to have some mild orthostatic hypotension.   1.  Orthostatic hypotension. This was likely the cause of patient's generalized weakness. The patient has had recurrent hospitalizations due to orthostasis in the past. His antihypertensives were held. He was taken off his diuretics. He was given gentle IV fluid hydration. After IV fluid hydration, the patient's clinical symptoms have significantly improved. The patient needs to further discuss his antihypertensive regimen with his primary care physician, as he may need to live with a blood pressure that is a bit on the higher side. I discussed this with the patient, who is going to have a discussion with Dr. Rockey Situ and his primary care physician as an outpatient. Since patient's weakness is improved and his orthostatic vital signs have improved, he is currently being discharged.  2.  Acute renal failure. This is likely secondary to the orthostatic hypotension. After IV fluid hydration, patient's creatinine is now back to baseline.   3. Chronic obstructive pulmonary disease. The patient had no evidence of acute COPD exacerbation. He was maintained on his Spiriva and Symbicort. He will resume that.   4.  History of chronic atrial fibrillation. The patient remained rate-controlled in the hospital. He will continue his amiodarone and Cardizem. He is not on any long-term anticoagulation, given his recent history of GI bleeding.   5.  Hyperlipidemia. The patient was maintained on his Crestor. Resume that.  6.  Hypertension. The patient was noted to be orthostatic. Therefore, his antihypertensive regimen was held, although he is being resumed back on his losartan, Cardizem, and his diuretics as mentioned.   7.   Anxiety. The patient was maintained on  Xanax, and he will resume that upon discharge.   8.  The patient is a FULL CODE.   Time spent is 40 minutes.    ____________________________ Belia Heman.  Verdell Carmine, MD vjs:mr D: 02/07/2013 13:58:00 ET T: 02/07/2013 19:14:53 ET JOB#: [id number]  cc: Belia Heman. Verdell Carmine, MD, <Dictator> Minna Merritts, MD Dr. Mila Palmer     Henreitta Leber MD ELECTRONICALLY SIGNED 02/19/2013 15:03

## 2014-12-09 NOTE — H&P (Signed)
PATIENT NAME:  Javier [name], Javier [name] MR#:  [id number] DATE OF BIRTH:  April 09, 1936  DATE OF ADMISSION:  03/17/2013  DICTATING HOSPITALIST: Laikyn Gewirtz S. Manuella Ghazi, MD.  PRIMARY CARE PHYSICIAN: Francee Nodal, MD.    REQUESTING PHYSICIAN: Dr. Dahlia Client.  CHIEF COMPLAINT: Dizziness.   HISTORY OF PRESENT ILLNESS: The patient is a 79 year old male with a known history of orthostatic hypotension, coronary artery disease, COPD, hypertension, is being admitted for syncope. The patient has been feeling weak for the last couple of days and his legs would lock and unlock.   Last night around 1:30, he got up to go to the bathroom from the living room and passed out. Family did not hear any thumping sound. Neither he recalls what had really happened, but family found him on the floor and brought him to the Emergency Department for further evaluation.  The patient has been complaining of tremors in the hands and legs for the last week to 10 days for which he saw his primary care physician who started him on primidone 50 mg 1 a day which he has started using for the last 2 days and feels that might have contributed to his fall.   He denies any chest pain, shortness of breath, cough, fever or any other symptoms at this time.   PAST MEDICAL HISTORY:  1.  Diastolic heart failure.  2.  COPD.  3.  Chronic atrial fibrillation for which he was on Xarelto at some point, which was discontinued due to anemia and hematuria.  4.  Hypertension.  5.  Prostatic cancer.  6.  History of coronary artery disease status post CABG in 2008. Also had multiple stents.  7.  Carotid endarterectomy and TIA.  8.  History of peripheral vascular disease.  9.  Obesity.  10.  Obstructive sleep apnea.   PAST SURGICAL HISTORY:  1.  CABG.  2.  Carotid endarterectomy.  3.  Cardiac stents.  4.  Prostate surgery.  5.  LASIK eye surgery.   SOCIAL HISTORY: Former smoker. He quit for about 2 to 3 years. No alcohol or other drug use. He is retired  from [employer], [employer]. He is married and lives with his wife and his daughter and with his son-in-law.   FAMILY HISTORY: Father died from complication of throat and esophageal cancer. Mother died at age of 12 from heart attack.   ALLERGIES: PREDNISONE.   MEDICATIONS AT HOME:  1.  Acetaminophen/hydrocodone 325/5 mg 1 tablet p.o. every 4 hours as needed. 2.  DuoNeb inhalation as needed.  3.  Aldactone 25 mg p.o. daily.  4.  Alprazolam  0.25 mg p.o. every 6 hours as needed.  5.  Amiodarone 200 mg p.o. b.i.d.  6.  Ascorbic acid 500 mg 2 tablets p.o. daily.  7.  Crestor 20 mg p.o. daily.  8.  Cardizem 120 mg p.o. daily.  9.  Colace 100 mg p.o. b.i.d.  10.  Eligard injection once every 6 months on April 1 and October 1.  11.  Iron sulfate 325 mg p.o. b.i.d.  12.  Fish oil 500 mg 2 capsules p.o. daily.  13.  Lasix 20 mg p.o. daily as needed for weight gain more than 3 pounds in 24 hours and 4 pounds in 7 days or if edema worsens with severe shortness of breath.  14.  Xopenex inhaled every 4 hours as needed.  15.  Losartan 25 mg p.o. daily.  16.  Singulair 10 mg p.o. daily.  17.  Morphine  20 mg/mL, 0.25 mL p.o. every 4 hours as needed.  18.  Multivitamin 1 tablet p.o. daily.  19.  Omeprazole 40 mg p.o. b.i.d. 20.  Os-Cal D 1 tablet p.o. b.i.d. 21.  Potassium chloride 10 mEq p.o. daily as needed.  22.  Primidone 50 mg p.o. daily.  23.  Senna 8.6 mg p.o. daily.  24.  Sertraline 50 mg p.o. daily.  25.  Spiriva once daily. 26.  Symbicort 2 puffs inhaled twice a day.  27.  Ventolin HFA 2 puffs inhaled every 4 hours as needed.   REVIEW OF SYSTEMS:  CONSTITUTIONAL: No fever. Positive for fatigue and weakness.  EYES: No blurred or double vision. He does have a history of glaucoma.  LUNGS: No cough, wheezing, hemoptysis.  CARDIOVASCULAR: No chest pain, orthopnea, edema.  GASTROINTESTINAL: No nausea, vomiting, diarrhea.  GENITOURINARY: No dysuria or hematuria.   ENDOCRINE: No polyuria or nocturia.  HEMATOLOGY: No anemia or easy bruising.  SKIN: No rash or lesion.  MUSCULOSKELETAL: No arthritis or muscle cramp.   NEUROLOGICAL: Positive for syncope. No tingling or numbness. Positive for weakness.  PSYCHIATRIC: Positive for anxiety. No history of depression.   PHYSICAL EXAMINATION:  VITAL SIGNS: Temperature 98.1, heart rate 103 per minute, respirations 20 per minute, blood pressure 116/62 mmHg, saturating 96% on 2 liters oxygen via nasal cannula.  GENERAL: A 79 year old male lying in the bed comfortably without any acute distress.  EYES: Pupils equal, round, reactive to light and accommodation. No scleral icterus. Extraocular muscles intact.  HEENT: Head atraumatic, normocephalic. Oropharynx and nasopharynx clear. NECK: Supple. No jugular venous distention. No thyroid enlargement or tenderness.  LUNGS: Clear to auscultation bilaterally. No wheezing, rales, rhonchi or crepitation.  CARDIOVASCULAR: S1, S2 normal. No murmurs, rubs or gallop.  ABDOMEN: Soft, obese, nontender, nondistended. Bowel sounds present. No organomegaly or mass.  EXTREMITIES: No pedal edema, cyanosis, clubbing.  NEUROLOGIC: Cranial nerves II through XII are intact. Muscle strength 5/5 in all extremities. Sensation intact.  PSYCHIATRIC: Seems alert and oriented x 3.  SKIN: No obvious rash, lesion or ulcer.  MUSCULOSKELETAL: No joint effusion or swelling.   DIAGNOSTIC STUDIES: Metabolic panel:  1.  Normal BMP except sodium 135, BUN of 30, creatinine 1.54, blood sugar of 113.  2.  Normal liver function tests.  3.  Normal first set of cardiac enzymes.  4.  CBC showed white count 12.3, hemoglobin 13.0, hematocrit 37.8, platelets 179.  5.  Negative UA.  6.  Chest x-ray while in the ED showed possible pulmonary edema. Underlying pulmonary fibrosis cannot be excluded.  7.  CT scan of the head without contrast in the ED showed no acute intracranial abnormality.   IMPRESSION AND PLAN:   1.  Syncope of unknown etiology. Will do serial troponins, monitor.  Monitor unit telemetry. Check TSH. Consult cardiology and neurology. Will check orthostatic vitals. This could be due to dehydration and/or new medication which he was started on last week, and he has only taken 2 doses of primidone which was started mainly for his tremors. We will wait for neurology opinion on that but will hold for the time being. He had negative carotid Dopplers last year. He had a nonfocal examination, will hold off MRI at this time.  2.  History of orthostatic hypotension. We will await cardiology input and check orthostatics. At this time, continue his blood pressure medication. Avoid diuretics.  3.  Acute renal failure, likely due to dehydration. Will hydrate him with IV fluids and monitor his renal function.  4.  Chronic obstructive pulmonary disease. Seems stable at this time. Continue home inhalers and nebulizer.  5.  History of chronic atrial fibrillation. Was on Xarelto at some point. Has been stopped due to gastrointestinal bleed. His rate is controlled. We will continue amiodarone and Cardizem and monitor him on telemetry.  6.  Hyperlipidemia. We will continue Crestor. 7.  Hypertension. Will continue Cardizem and Rocephin as needed.  8.  Anxiety. We will continue Xanax as needed.   CODE STATUS: FULL CODE.   TOTAL TIME TAKING CARE OF THIS PATIENT: 55 minutes.    ____________________________ Lucina Mellow. Manuella Ghazi, MD vss:np D: 03/17/2013 17:07:48 ET T: 03/17/2013 18:12:23 ET JOB#: [id number]  cc: Javier [name] S. Manuella Ghazi, MD, <Dictator> Francee Nodal, MD Minna Merritts, MD   Lucina Mellow [facility] MD ELECTRONICALLY SIGNED 03/19/2013 12:15

## 2014-12-09 NOTE — Consult Note (Signed)
Urology Consultation Report For Consultation: Follow up Urinary Retention, Gross Hematuria, Prostatitis MD: Epifanio Lesches, M.D.MD: Darcella Cheshire, M.D. Local Urologist: Norvel Richards, M.D., [facility], [place], [place] 79 y.o. [place], well-known to me form a recent consultation 11/29/2012 (please refer to that detailed Consultation Report). Pt was d/c from [facility] on 11/30/2012 following resolution of his acute on chronic CHF due to diastolic dysfunction with diuresis. The pt experienced a mild bump in his Creatinine, however, on  the increased dose of Lasix (40mg ) and so was d/c'd on 20mg  po qOD until f/u with Cardiology. With regard to the urologic issues during that hospitalization (ongoing urinary retention with initial foley placement 11/24/2012 for a 1425mL PVR, recurrent gross hematuria on Xarelto, catheter-associated prostatitis with bladder spasms), the hematuria resolved with brief holding of the Xarelto (24 hrs) and Augmentin was added to the pt's Levofloxacin to broaden the antimicrobial coverage. Follow up with the pt's established urologist was scheduled for this Wednesday, 12/09/2012. pt was doing well, at home, from the urologic standpoint, aside from severe bladder spasms with ambulation. There was only a brief episode of mild gross hematuria that resolved quickly. He completed the Levofloxacin on Friday, 12/04/2012 and his prednisone taper for his excarbation of COPD on Thursday, 12/03/2012. Yesterday, the pt repesented to [facility] ER with acute, generalized weakness, progressive dyspnea, and peripheral edema. He was found to be back in CHF with acute on CRI (Cr 1.42, increased from 1.19 on 11/30/2012) and progressive anemia (Hct 28.6%, down from 36% on 11/29/2012). He was admitted and diuresed with IV Lasix with 1345mL urine output over the initial 3hrs, 8min and 888mL over the night shift. His Losartan was held with an improvement in the Cr to 1.35 this morning. His Hct, however, continues  to drop to 26.3% without any gross hematuria. is being consulted to assist in the management of the foley catheter (?trial of voiding). PSH, SH, FH, ROS all as per the recent Consultation 11/29/2012 withe changes as noted in the HPI. Exam: T (37.2 C), P (78), RR (20), BP (104/66), O2 Sat (97% on 2L [place]) WDWN WM in NADWarm/Dry, no lesions about the Head/NeckNC/AT, EOMI, anictericno masses/bruitsCTA with nL respiratory effortRRR without M/G/R, 1+ Radial Pulses b/LNT/ND, no palpable masses/organomegalynL circ phallus with foley in good position draining clear light amber urine; b/L descended testes - NT, no massesnL ROMnn-focalA&O x4, pleasant, cooperative, and appropriate per HPI  Urinary Retentionuncertain, but likely multifactorial (acute overdistention with detrusor stretch myopathy +/- local recurrence of prostate cancer). The pt's bladder has been decompressed since 11/24/2012. He has also been on alpha blockade with tamsulosin. In this regard, a trial of voiding would not be unreasonable, however, the pt has been re-admitted with acute on chronic CHF requiring diuresis, as well as acute on CRI. Maintaining the foley catheter would be beneficial with regard to diuresis and monitoring UO during recovery of the ARI. The pt is tolerating the foley while in bed, but experiences significant bladde spasms with activity, so would like to remove the foley as soon as the CHF and ARI are resolved. Gross Hematurialikely multifactorial, however, has been essentially resolved since 11/28/2012. Prostate Cancers/p Brachytherapy - well-controlled on androgen deprivation. Acute on CRI off Losartan, despite diuresis. Progressive anemia due to gross hematuria as this has not been an issue since 11/28/2012 and his Hct was 36% on 11/29/2012. It is currently 26.3% despite diuresis. Remains hemodynamically stable, though, with improving renal function.  Keep foley until this is no further need  for diuresis and the creatinine  stabilizes.oxybutynin at midnight tonight in anticipation of possible voiding trial tomorrow morning. Agree with resuming antibiotics (Levofloxacin and Augmentin). Search for alternate sources of acute blood loss (?GI). follow with you.   Electronic Signatures: Darcella Cheshire (MD)  (Signed on 19-Apr-14 12:48)  Authored  Last Updated: 19-Apr-14 12:48 by Darcella Cheshire (MD)

## 2014-12-09 NOTE — H&P (Signed)
PATIENT NAME:  Javier [name], Javier [name] MR#:  [id number] DATE OF BIRTH:  [birth year]  DATE OF ADMISSION:  06/24/2013   PRIMARY CARE PHYSICIAN:  Dr. Casimer Lanius  CARDIOLOGIST:  Dr. Rockey Situ.   ADDENDUM:    The patient was seen earlier in the ER, plan was to get a second troponin and if negative send the patient home. The patient had continuous chest pain throughout, very nervous and very concerned. He wanted to end up getting a stress test. I will bring the patient in an observation, get a stress test and cardiology consultation in the morning. Pain is reproducible. I will give a dose of Solu-Medrol now and again in the morning. Hopefully he will be feeling better by tomorrow afternoon. Likely discharge tomorrow afternoon. The patient will be admitted as an observation.   TOTAL TIME SPENT ON PATIENT CARE: 75 minutes.    ____________________________ Tana Conch. Leslye Peer, MD rjw:cc D: 06/24/2013 19:51:12 ET T: 06/24/2013 20:09:38 ET JOB#: [id number]  cc: Tana Conch. Leslye Peer, MD, <Dictator> Marisue Brooklyn MD ELECTRONICALLY SIGNED 06/25/2013 18:58

## 2014-12-09 NOTE — Consult Note (Signed)
Urology Consultation Report for Consultation: Urinary Retention with Gross Hematuria/Prostatitis MD: Vivien Presto, M.D.MD: Darcella Cheshire, M.D. Urologist: Norvel Richards, M.D., [facility], [place], [place] 79 y.o. [place] admitted 11/26/2012 with Gross Hematuria/Prostatitis and Acute on Chronic CHF due to hypertensive diastolic dysfunction. The pt had been d/c'd from [facility] on 11/25/2012 following an admission for new onset atrial flutter 11/17/2012. The pt was successfully electrocardioverted on 11/19/2012. During that hospitalization, the pt developed acute urinary retention for which Dr. Edrick Oh was consulted 11/24/2012. The pt was found to have a PVR of 1474mL and a foley catheter was recommended for 7-10 days with alpha blockade with tamsulosin. The pt was to follow up with his established urologist in [place], Dr. Idell Pickles of [facility]. The pt had been d/c'd on Xarelto, as well, for his episode of atrial flutter. The first morning at home, after discharge, the pt awoke to find his urinary leg bag full of bright red blood. He emptied the bag and upon re-evaluation 45 minutes later, persistent bright red hematuria was appreciated prompting a return to the [facility] ER. the ER, the patient was noted to have an extremely tender prostate with a leukocystosis (24.5k). UA was significant for primarily gross hematuria, 18 wbc/hpf, neg nitrites (Urine and BLood Cultures would return neg). There was a mild anemia (Hct 37.1%).  pt was admitted for close observation of the hematuria and the Xarelto was held. The patient was started on Rocephin and changed to levofloxacin. The gross hematuria resolved promptly and the Xarelto was resumed 11/27/2012 without any recurrent gross hematuria. The patient c/o chronic perineal pain (5/10) which is exacerbated by movement, since re-admission. The pain required parenteral morphine yesterday, however, none today. The pt relates that he only had perineal and  suprapubic discomfort during the last admission when he was in retention. He denies any discomfort with urinary catheterization, only marked relief of pain. Currently, the urine is clear amber. pt's past urologic history is significant for recurrent prostate cancer s/p brachytherapy 6 years ago for Gleason Sum 6 cancer with a PSA of 5.0. The pt developed PSA recurrence to 10 for which he was treat with androgen deprivation with good response with a PSA of 0.14 on 03/09/2009. The pt requested a trial off androgen deprivation, however, promptly developed a rapidly rising PSA (5.6 on 04/27/2009) which was treated with resumption of androgen blockade. The last PSA was <0.05 on 07/20/2012. The pt did experience a transient episode of gross hematuria in Jan 2012. Of note, the pt was started on Spiriva 6 months ago for his COPD.  A-flutter (11/17/2012) - s/p successful cardioversion 3/5/[id number] due to diastolic dysfunctionCAD (s/p MI x2, most recently, 07/2012; s/p CABG x3 in 2004 and Cardiac Stents x4 six months after the CABG)COPD/Chronic BronchitisObstructive Sleep Apnea - on nocturnal O2 (2L)PVDProstate Cancerh/o TIA's (last 06/2012; s/p b/L CEA 2000)Anxiety (due to severe CAD/COPD)  s/p CABG x3 ([facility], 2004)s/p b/L CEA ([facility], 2000, Dr. Delfino Lovett McCann)s/p Cardiac Stents x4 (6 months after the CABG, [place])s/p [facility] (3 yrs ago)s/p T&A Former smoker (d/c'd 2 yrs ago; 1/2 ppd x 40 yrs); Denies alcohol use or h/o dependence. Married; lives withi his wife, daughter, and son-in-law in [place]. Postive for CAD, DM NKDA  Symbicort 160/4.5, 2 puffs bidCentrum Silver 1 po qDay (held on admission)Vitamin C, 1g po qDayXanax 0.25mg  po q6hrs prn anxietyCrestor 20mg  po qDayVentolin 2 puffs 4 times/day prn (rare usage)Combivent 4 times/day prn (rare usage)Lasix 20mg  po prn peripheral edema (  rare usage)Tramadol/acetominophen 37.5/325 po bid prn joint pain (uses approx q2weeks)Imdur 30mg  po qHSOmeprazole 20mg  po  qDayFISH Oil 1 g po bid (held on admission)Ferrous Sulfate 325mg  po bidNitrostat SL prn angina (rare usage)Singulair 10mg  po qDayColace prnOs-Cal 1 tab po bid (held on admission)Eligard 45mg  SQ q63monthsLosartan 50mg  po qDayTamsulosin 0.4mg  po qDayAmiodarone 200mg  po bidCardizem 300mg  po qDayXarelto 20mg  po qDay x 4 weeksNystatin swish and spit prn oral thrushCoreg 6.25mg  po bid  No fever/chill; positive for fatigue/weakness and weight gainno visual disturbances; no URIDOE, chronic cough/wheezesno CP/anginano N/V, diarrhea or constipation, no BRBPR or melena; recent colonoscopy 2 weeks ago significant for rectal bleeding points x4 that were cauterizedper HPIno bleeding diathesis or adenopathyno excess thirstno rashes/lesionsno joint painno lateralizing weakness, dysarthria or amaurosis fugaxno current anxiety or depression Examination T (20F), P (72), RR (20), BP (101/62), O2 Sat (94% on [place]) WDWN WM in NADMoist over the back, no lesions about the head/neckNC/AT, EOMI, anictericno masses/bruitsCTA with nL resp effortRRR with 6/6 Decrescendo Sys Murmur, no gallops/rubs, 1+ radial pulses b/LNT/ND, no palpable masses or organomegaly, no CVATnL circ phallus with foley in good position draining clear amber urine; b/L descended testes - NT, no massesNT, no edemanon-focalA&O x4, pleasant, cooperative, appropriate  WBC (24.5k), Hct (37.1%), Plt (191k), PT INR (1.3)/PTT (24.8), Cr 91.26); Urine and Blood Cx's negWBC (18k), Hct (36%), Plt (179k); Cr (1.11)WBC (18.2k), Hct (36%), Plt (165k)  Gross Hematuria - currently resolved with holding Xarelto x 24hrs without recurrence now back on Xarelto x 48hrs. Multiple potential contributing factors (h/o pelvic radiation, clinical prostatitis, indwelling foley on anticoagulation). No current indication for acute urologic intervention. Prostatitis - was not present during the onset of urinary retention, by history, however, given the repeated catheterizations during the prior  hospitalization with the recent prednisone taper, he may have developed a catheter-related prostatitis. Slowly improving perineal discomfort on Levofloxacin. Prostate Cancer - recurrent s/p brachytherapy - responding well to androgen deprivation.  Keep foley with f/u with his established urologist, Dr. Idell Pickles, in [place], as planned.Continue levofloxacin, but would add back Augmentin to broaden the coverage (will better cover enterococcus) given the persistent, significant perineal pain.Further evaluation of gross hematuria will be deferred to the pt's established urologist in [place] 5mg  po q6hrs prn bladder spasms/perineal pain.If the hematuria does not recur by the AM, ok for d/c to home from the Urologic standpoint. you very much for the opportunity to participate in this complicated, but most pleasant gentleman.   Electronic Signatures: Darcella Cheshire (MD) (Signed on 13-Apr-14 19:59)  Authored   Last Updated: 13-Apr-14 20:16 by Darcella Cheshire (MD)

## 2014-12-09 NOTE — Discharge Summary (Signed)
PATIENT NAME:  Javier [name], Javier [name] MR#:  [id number] DATE OF BIRTH:  07/02/1936  DATE OF ADMISSION:  05/06/2013 DATE OF DISCHARGE:  05/07/2013  ADMITTING DIAGNOSIS: Chest pain.   DISCHARGE DIAGNOSES:  1. Chest pain, felt to be noncardiac in origin, status post evaluation by cardiology. Possible musculoskeletal pain. Negative cardiac enzymes.  2. Recent history of cerebrovascular accident, status post tissue plasminogen activator, also has a history of atrial fibrillation, now restarted on Coumadin.  3. History of chronic diastolic heart failure without any evidence of acute exacerbation.  4. History of chronic obstructive pulmonary disease.  5. Hypertension.  6. History of prostate cancer.  7. History of coronary artery disease, status post coronary artery bypass grafting, with multiple stents.  8. Carotid endarterectomy with history of transient ischemic attack.  9. History of peripheral vascular disease.  10. Morbid obesity.  11. Obstructive sleep apnea.  12. Recent diagnosis of cerebrovascular accident, status post tissue plasminogen activator.  13. Status post carotid endarterectomy, cardiac stents, prostate surgery, coronary artery bypass grafting, laser in-situ keratomileusis surgery.   CONSULTANTS: Kathlyn Sacramento, MD  PERTINENT LABS AND EVALUATIONS: WBC 10.4, hemoglobin 12, platelet count 193. BMP: Glucose 99, BUN 20, creatinine 1.46, sodium 139, potassium 3.9, chloride 108, CO2 is 24. Troponin was less than 0.02 x3. Chest x-ray showed cardiomegaly with pulmonary edema. EKG showed atrial fibrillation without any ST-T wave changes.   HOSPITAL COURSE: Please refer to H and P done by me yesterday. The patient is a 79 year old with multiple medical problems, including coronary artery disease, CHF, recent CVA, receiving tPA, who presents with complaint of having chest pain which started earlier in the morning. The patient's symptoms were atypical; however, due to his history, we were asked to  admit the patient. He was placed under observation. Serial cardiac enzymes were done, which were negative. He was seen by cardiology. They felt that this was more musculoskeletal-related pain. His pain has resolved, and he is able to ambulate without any difficulties, currently stable for discharge.   DISCHARGE MEDICATIONS:  1. Symbicort 2 puffs b.i.d. 2. Iron sulfate 325 mg 1 tab p.o. b.i.d.  3. Eligard 45 mg every 6 months. 4. Singulair 10 daily.  5. Alprazolam 0.25 one tab p.o. q.6 p.r.n. 6. Ventolin 2 puffs q.4 p.r.n. 7. Morphine 0.25 q.4 p.r.n. 8. Acetaminophen/hydrocodone 325/1 one tab p.o. q.4 p.r.n. 9. Topamax 25 one tab p.o. b.i.d.  10. Codeine/guaifenesin 10 mL q.4 as needed 11. Nitrostat 0.4 sublingual p.r.n. 12. Prilosec 20 daily.  13. Fluticasone __________ daily.  14. Senna 1 tab p.o. daily. 15. Zoloft 50 daily.  16. Vitamin C 1000 mg daily. 17. Crestor 20 daily.  18. Warfarin 3 mg daily.  19. Fish oil 1000 mg 1 tab p.o. b.i.d. 20. Colace 100 one tab p.o. b.i.d., 21. Spiriva 18 mcg daily. 22. Lasix 20 one tab p.o. daily as needed. 23. Mucinex 600 one tab p.o. b.i.d. as needed. 24. Albuterol/Atrovent nebulizers for shortness of breath. 25. Robitussin 10 mL q.4 p.r.n.  26. Acetaminophen/tramadol 325/37.5 one to two tabs as needed.  27. Nystatin 5 mL b.i.d. 28. Aspirin 81 one tab p.o. daily.   DIET: Low sodium, low fat, low cholesterol.   ACTIVITY: As tolerated.   FOLLOWUP: With primary MD in 1 to 2 weeks.   TIME SPENT: Note, 35 minutes spent.  ____________________________ Lafonda Mosses. Posey Pronto, MD shp:OSi D: 05/07/2013 13:09:13 ET T: 05/07/2013 13:50:01 ET JOB#: [id number]  cc: Omni Dunsworth H. Posey Pronto, MD, <Dictator> Alric Seton MD ELECTRONICALLY SIGNED 05/17/2013  8:35 

## 2014-12-09 NOTE — H&P (Signed)
PATIENT NAME:  Javier [name], Javier [name] MR#:  [id number] DATE OF BIRTH:  February 10, 1936  DATE OF ADMISSION:  12/13/2012  REFERRING PHYSICIAN:  Dr.  Ulice Brilliant   PRIMARY CARE PHYSICIAN:  Dr. Mila Palmer   CARDIOLOGIST:  [facility], Dr. Rockey Situ  CHIEF COMPLAINT:  Chest pain.   HISTORY OF PRESENT ILLNESS:  The patient is a pleasant, 79 year old Caucasian male with multiple hospitalizations this month, with medical issues including diastolic CHF, COPD,  recent HCAP, oxygen-dependent COPD, history of prostatitis and AFib, status post cardioversion, who was on Xarelto and developed hematuria. He was just discharged a couple of days ago to [facility]. He was doing fine yesterday, but today developed some dizziness and was found to have low blood pressure, initially 90 systolic, then to 67H. He developed some chest pain, was given nitroglycerin. The chest pain is better, but still persistent. The chest pain did radiate to the left shoulder. The nitroglycerin was given x 3, and the pain is better now. He has a negative troponin, and EKG shows no significant ST elevations or depressions, and the hospitalist service was contacted for further evaluation and management. Of note, during the last hospitalization he had diastolic CHF, acute on chronic, as well as COPD flare and HCAP. He is in the middle of his prednisone taper, and he is on Zyvox to cover for possible MRSA for the HCAP. He has had no fevers, and the leukocytosis has trended down.   PAST MEDICAL HISTORY:  1.  History of recent prostatitis.  2.  Hematuria while on Xarelto, currently resolved.  3.  COPD, with nocturnal oxygen. 4.  Several bouts of acute on chronic diastolic CHF.   5.  History of A-flutter, status post cardioversion earlier this year, on Ambien, diltiazem and Coreg.  6.  History of bilateral carotid endarterectomy. 7.  History of TIA.   8.  History of prostate cancer.  9.  History of CAD, status post CABG in 2007, with occlusion of 2  vein grafts, with PCI x 4.  10.  History of PVD.  11.  History of hypertension. 12.  History of obstructive sleep apnea. 13.  History of obesity. 14.  History of iron deficiency anemia.   ALLERGIES:  No known drug allergies.   OUTPATIENT MEDICATIONS:  1.  Acetaminophen/hydrocodone 325/5 mg 1 tab every 4 hours as needed for pain.  2.  Symbicort 160/4.5, 2 puffs 2 times a day.  3.  Centrum Silver 1 tab daily.  4.  Ventolin HFA 90 mcg 2 puffs 4 times a day as needed for shortness of breath.  5.  Imdur 30 mg daily.  6.  Ferrous sulfate 325 mg 2 times a day.  7.  Nitrostat p.r.n.  8.  Docusate sodium 1 tab 2 times a day, 100 mg.  9.  Os-Cal 1 tab 2 times a day.  10.  Eligard 45 mg subcu every 6 months.  11.  Losartan 50 mg daily.  12.  Tamsulosin 0.4 mg daily.  13.  Coreg 6.25 mg 2 times a day.  14.  Amiodarone 200 mg daily.  15.  Morphine oral concentrate 0.25 mL every 4 hours as needed for pain or dyspnea, 20 mg per mL oral concentrate.  16.  Diltiazem 300 mg extended release daily.  17.  Xopenex every 4 hours.  18.  Tiotropium 18 mcg 1 cap daily inhaled.  19.  Lasix 20 mg daily.  20.  Guaifenesin p.r.n.  21.  Montelukast 10 mg at bedtime.  22.  Vitamin C 1 gram daily.  23.  Omega-3, 1 cap 1 gram daily.  24.  Alprazolam 0.25 mg every 6 hours as needed.  25.  Rosuvastatin 20 mg daily.  26.  Omeprazole 40 mg 2 times a day.  27.  Zoloft 50 mg daily.  28.  Prednisone taper.  29.  Linezolid 600 mg every 12 hours for 8 days additional.   SOCIAL HISTORY: Quit smoking 2 years ago. No alcohol or drug use. Currently at [facility].   FAMILY HISTORY:  Diabetes as well as CAD.  REVIEW OF SYSTEMS:  CONSTITUTIONAL:  Positive for global weakness. No focal weakness.  EYES:  No blurry vision or double vision.  EARS, NOSE, THROAT:  No tinnitus or hearing loss. Had a bout of dizziness prior.  RESPIRATORY:  Dry cough, some mild wheezing. No dyspnea on exertion. Recent COPD flare and HCAP.    CARDIOVASCULAR: Chest pain as above. Improved now. Has a history of edema and diastolic CHF and hypertension. History of A-flutter, status post ablation, currently back in flutter.  GASTROINTESTINAL:  No nausea, vomiting, diarrhea, hematemesis or melena.  GENITOURINARY:  Denies dysuria or hematuria.  ENDOCRINE: No polyuria or nocturia. HEMATOLOGIC/LYMPHATIC:  Has history of anemia, no easy bruising.  SKIN:  No rashes.  MUSCULOSKELETAL:  Chronic pain.  PSYCHIATRIC:  No anxiety or insomnia.   PHYSICAL EXAMINATION: VITAL SIGNS:  Temperature on arrival 98, respiratory rate 20, blood pressure 104/51, last blood pressure 123/67, pulse rate 75, O2 sat 96% on oxygen.  GENERAL:  The patient is an obese, Caucasian male lying in bed in no obvious distress, talking in full sentences.  HEENT: Normocephalic, atraumatic. Pupils are equal and reactive. Anicteric sclerae. Extraocular muscles intact. No JVD.  NECK:  Supple. Moist mucous membranes.  CARDIOVASCULAR:  S1, S2. Irregularly irregular. No murmurs, rubs or gallops.  LUNGS:  There is wheezing, right greater than left, with no significant crackles, somewhat coarse on the right.  ABDOMEN:  Soft, nontender, nondistended. Positive bowel sounds in all quadrants.  EXTREMITIES:  Have 1+ edema  NEUROLOGIC: Cranial nerves II through XII grossly intact. Strength is 5/5 in all extremities. Sensation intact to light touch.  PSYCHIATRIC:  Awake, alert, oriented x 3.   LABS:  BNP 1823. BUN 38, creatinine 1.4, of note it was 1.4 on April 21 and 1.25 on April 25. Sodium 136, potassium 4.3. Troponin negative. CK-MB negative, CK total 15. WBC 8.9, of note it was 11.3 on April 21, hemoglobin 9.6, platelets 271.   EKG: Shows rate of 69 and flutter. No acute ST elevations or depressions.   Chest x-ray PA and lateral showing bilateral diffuse interstitial thickening, likely chronic interstitial disease.   ASSESSMENT AND PLAN: We have a 79 year old Caucasian male with  multiple hospitalizations recently, including for healthcare-associated pneumonia, chronic obstructive pulmonary disease flare, diastolic congestive heart failure flare, hematuria. Just discharged a couple of days ago, presents with chest pain. The patient does have significant peripheral vascular disease as well as coronary artery disease, status post coronary artery bypass grafting and stenting. The chest pain is better, but persistent. There is no troponin leak, and he has no significant ST elevations or depressions.   The patient is well-known to [facility], and at this point we will admit the patient for observation and rule out acute coronary syndrome by cyclic cardiac markers. Would not do an echocardiogram, as it was just done within a month. Would start morphine p.r.n. Would obtain Cardiology consult and let them decide if  patient needs invasive versus noninvasive testing with his extensive peripheral vascular disease and CAD. Would continue the beta blocker, hold ARB, as well as decrease the calcium channel blocker to see if we have room to go up on the Imdur, given the recent blood pressure drop. Blood pressure currently is better. Would start morphine p.r.n. as well. In regards to the chronic diastolic CHF, although BNP is up, I think this is close to his baseline and I will just do the outpatient dose of Lasix at this point. In regards to the COPD flare, he is in the middle of his prednisone taper, which I would continue, as well as his linezolid for his  HCAP. He does have renal failure, which is stage III, and possibly this is his new baseline, given the trend of the creatinine that I am looking at in the last month or so. He is back in flutter, but is rate controlled and he is not on Xarelto,  per Cardiology. He is a FULL CODE. His hemoglobin level is stable.   Total time spent:  55 minutes.     ____________________________ Vivien Presto, MD sa:mr D: 12/13/2012 17:45:19  ET T: 12/13/2012 18:02:11 ET JOB#: [id number]  cc: Vivien Presto, MD, <Dictator> Minna Merritts, MD  Vivien Presto MD ELECTRONICALLY SIGNED 01/11/2013 13:55

## 2014-12-09 NOTE — Discharge Summary (Signed)
PATIENT NAME:  Javier [name], Javier [name] MR#:  [id number] DATE OF BIRTH:  25-Dec-1935  DATE OF ADMISSION:  08/15/2013 DATE OF DISCHARGE:  08/15/2013  ADMISSION DIAGNOSIS:  Transient ischemic attack.   DISCHARGE DIAGNOSES: 1.  Transient ischemic attack.  2.  History of cerebrovascular accident.  3.  History of atrial fibrillation on Coumadin.   CONSULTATIONS: None.   IMAGING:  1.  MRI of the brain showed no acute intracranial hemorrhage or CVA.  2.  Carotid ultrasound showed no hemodynamically significant stenosis.   HOSPITAL COURSE: This is a 79 year old male with known history of strokes in the past.  He is on Coumadin as well as aspirin and atrial fibrillation who presented again with left-sided weakness.  Found to have transient ischemic attack.   1.  Transient ischemic attack. The patient's symptoms were consistent with a transient ischemic attack. His INR was therapeutic. He is on aspirin.  We can change this to Plavix due to history of gastrointestinal. His MRI did not show evidence of cerebrovascular accident. He has had bilateral carotid endarterectomies in the past. His carotid ultrasound essentially showed no hemodynamically significant stenosis. We will continue Coumadin and aspirin.  2.  Atrial fibrillation. His INR is 2.8. He will continue his outpatient medications and follow Dr. Rockey Situ.  3.  History of coronary artery disease and coronary artery bypass graft and stent. The patient is on aspirin, metoprolol and Lopressor.  4. History of hyperlipidemia. The patient will continue on Crestor.   DISCHARGE MEDICATIONS: 1.  Symbicort two puffs twice a day.  2.  Ferrous sulfate twice a day.   3.  Eligard 45 mg every six months, on the first of April and first of October.  4.  Montelukast had 10 mg at bedtime.  5.  Ventolin HFA 2 puffs q. 4 hours p.r.n.  6.  Morphine 0.25 mg q. 4 hours p.r.n.  7.  Nitrostat sublingual p.r.n. chest pain.  8.  Florinef 0.1 mg daily.  9.  Zoloft 50 mg daily.   10.   Lopressor 20 mg daily.  11.  Fish oil 500 mg b.i.d.  12.  Docusate 100 mg b.i.d.  13.  Lasix 20 mg in the morning, p.r.n. swelling.  14.  DuoNeb  4 times a day as needed.  15.  Aspirin 81 mg daily.  16.  K-Chlor 10 mEq as needed when taking Lasix.  17.  Vitamin C 1000 mg daily.  18.  Xanax 0.25 mg q. 6 hours p.r.n.  19.  Acetaminophen/tramadol 325/37.5 q. 8 hours p.r.n.  20.  Metoprolol 50 mg b.i.d.  21.  Dilaudid 2 mg q. 4 hours.  22.  Ventolin 5 mg at bedtime.  23.  Omeprazole 20 mg daily.  24.  Spiriva 18 mcg daily.  25.  Coumadin 3 mg Monday, Wednesday, Friday and then 1.5 mg the other days in the evening.  26.  Carisoprodol 250 mg q. 6 hours.    DISCHARGE DIET: Low sodium, low fat diet.   DISCHARGE ACTIVITY: As tolerated.   DISCHARGE REFERRAL: Home health.   DISCHARGE FOLLOW-UP:  The patient will follow up with Barbette Reichmann, MD   TIME SPENT: 35 minutes. The patient is medically stable for discharge.   ____________________________ Christapher Gillian P. Benjie Karvonen, MD spm:cc D: 08/15/2013 14:19:49 ET T: 08/15/2013 23:58:13 ET JOB#: [id number]  cc: Avanthika Dehnert P. Benjie Karvonen, MD, <Dictator> Francee Nodal, MD Donell Beers Arnecia Ector MD ELECTRONICALLY SIGNED 08/16/2013 13:03

## 2014-12-09 NOTE — Discharge Summary (Signed)
PATIENT NAME:  Javier [name], Javier [name] MR#:  [id number] DATE OF BIRTH:  1936-01-17  DATE OF ADMISSION:  03/17/2013  DATE OF DISCHARGE:  03/18/2013  DISCHARGE DIAGNOSES: 1. Syncope, likely due to orthostatic hypotension, with volume depletion. Recommending compression stockings, no Aldactone, and as-needed Lasix for weight gain and/or fluid retention, per Cardiology.  2.  History of orthostatic hypotension, improving with above management, and avoid diuretics if at all possible.  3.  Acute on chronic kidney disease stage II to III, likely prerenal, with some worsening due to orthostatic hypotension, improving with hydration now. Creatinine close to baseline. 4.  Chronic diastolic heart failure, well compensated at this time, with ejection fraction of 55% to 60% by echo in April 2014. 5.  Recommend Lasix as needed.  6.  Persistent atrial fibrillation. Rate is well-controlled. Not a candidate for anticoagulation due to prior history of GI bleed, nosebleed and anemia. For now, will continue Cardizem and amiodarone.   SECONDARY DIAGNOSES: 1.  COPD.   2.  Chronic atrial fibrillation, not on anticoagulation.  3.  Diastolic heart failure.  4.  Hypertension.  5.  Prostate cancer.  6.  History of coronary artery disease, status post CABG.   7.  Carotid endarterectomy.  8.  TIA. 9.  History of peripheral vascular disease.  10.  Obesity.  11.  Obstructive sleep apnea.   CONSULTATIONS: 1.  Neurology, Dr. Melrose Nakayama. 2.  Cardiology, Dr. Fletcher Anon.  3.  Physical Therapy.   PROCEDURES/RADIOLOGY: Chest x-ray on July 30 showed no acute cardiopulmonary disease.   CT scan of the head without contrast in the ED showed no acute intracranial abnormality.   MAJOR LABORATORY PANEL: UA on admission was negative.   HISTORY AND SHORT HOSPITAL COURSE:  The patient is a 79 year old male with above-mentioned medical problem, who was admitted for syncope. Please see Dr. Trena Platt dictated History and Physical for further details.  Cardiology consultation was obtained with Dr. Fletcher Anon, who recommended stopping Aldactone, adding compression stockings, and use Lasix as needed, considering his significant orthostatic hypotension with volume depletion. The patient was feeling much better with IV hydration and adding compression stockings. Neurological consultation was obtained with Dr. Melrose Nakayama, who was in agreement with similar workup and management. The patient was feeling much better, was not feeling orthostatic or dizzy, was evaluated by Physical Therapy, and was recommended home health physical therapy, which was set up by Care Management. He is being discharged home in stable condition. He had a negative 3 sets of cardiac enzymes. He also had some hypomagnesemia, which was replaced and resolved.  On the date of discharge, his vital signs were as follows: Temperature 97.4, heart rate 87 per minute, respirations 22 per minute, blood pressure 120/71 mmHg, was saturating 95% on room air, not orthostatic any more on the date of discharge.   PERTINENT PHYSICAL EXAMINATION ON DAY OF DISCHARGE:  CARDIOVASCULAR:  S1, S2 normal. No murmur, rubs, gallop.  LUNGS:  Clear to auscultation bilaterally. No wheezing, rales, rhonchi or crepitation.  ABDOMEN:  Soft, benign.  NEUROLOGIC:  Nonfocal examination.  All other physical examination remained at baseline.   DISCHARGE MEDICATIONS: 1.  Symbicort 2 puffs inhaled twice a day.  2.  Iron sulfate 325 mg p.o. b.i.d.  3.  Eligard 45 mg subcu every 6 months on first of April and October.  4.  Colace twice a day.  5.  Spiriva once daily.   6.  Ascorbic acid 500 mg 2 tablets p.o. daily.  7.  Os-Cal with vitamin  D 1 tablet p.o. twice a day.  8.  Crestor 20 mg p.o. at bedtime.  9.  Fish oil 1000 mg p.o. daily.  10.  Losartan 25 mg p.o. daily.  11.  Singulair once daily.  12.  Omeprazole 40 mg p.o. b.i.d.  13.  Senna once daily.  14.  Sertraline 50 mg p.o. daily.  15.  Multivitamin once daily.   16.  Xopenex inhaled every 4 hours as needed.  17.  Alprazolam 0.25 mg p.o. every 6 hours as needed.  18. Lasix 20 mg p.o. daily as needed for weight gain more than 3 pounds in 24 hours and 4 pounds in 7 days, or if edema worsens or severe shortness of breath.  19.  Ventolin 2 puffs inhaled every 4 hours as needed.  20.  Morphine 20 mg per mL, 0.25 mL p.o. every 4 hours as needed.  21.  Acetaminophen/hydrocodone 325/5 mg 1 tablet p.o. every 4 hours as needed.  22.  Amiodarone 200 mg p.o. b.i.d.  23.  DuoNeb nebulizer inhaled as needed.  24.  Cardizem 120 mg p.o. daily.  25.  Potassium chloride 10 mEq p.o. daily as needed if Lasix is given.  26.  Primidone 50 mg p.o. daily.   DISCHARGE DIET:  Low sodium.   DISCHARGE ACTIVITY:  As tolerated.   DISCHARGE INSTRUCTIONS AND FOLLOWUP:  The patient was instructed to follow up with his primary care physician, Dr. Francee Nodal in 1 to 2 weeks. He will need followup with Cardiology, Dr. Fletcher Anon, in 2 to 4 weeks, and [facility] Neu[facility], Dr. Melrose Nakayama in 4 to 6 weeks. Total time discharging this patient was 63. He was also instructed to use compression stockings.    ____________________________ Lucina Mellow. Manuella Ghazi, MD vss:mr D: 03/18/2013 15:56:08 ET T: 03/18/2013 19:52:21 ET JOB#: [id number]  cc: Clare Casto S. Manuella Ghazi, MD, <Dictator> Francee Nodal, MD Mertie Clause. Fletcher Anon, MD Doneta Public Melrose Nakayama, MD      Remer Macho MD ELECTRONICALLY SIGNED 03/19/2013 12:15

## 2014-12-09 NOTE — H&P (Signed)
PATIENT NAME:  Javier [name], SPRINGBORN MR#:  [id number] DATE OF BIRTH:  08-28-1935  DATE OF ADMISSION:  12/22/2012  PRIMARY CARE PHYSICIAN: Dr. Georgiana Shore    CHIEF COMPLAINT: Hypotension and chest pain.   HISTORY OF PRESENT ILLNESS: This is a 79 year old male who presents to the hospital with hypotension and chest pain. The patient actually presented from his cardiologist's office. The patient was recently discharged from the hospital about a week ago to a [place] for further rehab. He was discharged on a prednisone taper and also Zyvox for a possible bronchitis/pneumonia. He has finished antibiotic and steroid therapy about 2 days ago. He went for a follow up to his cardiologist's office today. At the office he felt a little bit dizzy. They checked his blood pressure and it was noted to be low when the 60s to 70s. They sent him over to the ER for further evaluation. At triage, the patient was not noted to be hypotensive. Although he did have periods of hypotension while here in the ER. He also had some chest pain, for which he received some aspirin and some nitroglycerin and morphine and his symptoms have significantly improved since then. The patient was noted to have a mild leukocytosis. Hospitalist services were contacted for further treatment and evaluation.   REVIEW OF SYSTEMS: CONSTITUTIONAL: No documented fever. No weight gain or weight loss.  EYES: No blurry or double vision.  ENT: No tinnitus. No postnasal drip. No redness to the oropharynx.  RESPIRATORY: No cough, no wheeze, no hemoptysis, no dyspnea.  CARDIOVASCULAR: Positive chest pain. No orthopnea, no palpitations or syncope.  GASTROINTESTINAL: No nausea, no vomiting, no diarrhea. No abdominal pain, no melena or hematochezia.  GENITOURINARY: No dysuria or hematuria.  ENDOCRINE: No polyuria or nocturia. No polymyalgia or heat or cold intolerance.  HEMATOLOGIC: No anemia, no bruising, no bleeding.  INTEGUMENTARY:  No rashes. No lesions.  MUSCULOSKELETAL: No arthritis. No swelling. No gout.  NEUROLOGIC: No numbness or tingling. No ataxia. No seizure-type activity.  PSYCHIATRIC: No anxiety. No insomnia. No ADD.   PAST MEDICAL HISTORY: Consistent with end-stage chronic obstructive pulmonary disease, history of chronic atrial fibrillation, history of a GI bleed and hematuria  now off anticoagulation, history of peripheral vascular disease status post bilateral carotid endarterectomy, history of prostate cancer, history of coronary artery disease status post CABG, obstructive sleep apnea, history of iron deficient anemia, GERD.   ALLERGIES: PREDNISONE.   SOCIAL HISTORY: Used to be a smoker about a pack and a half a day for 40-50 years, quit about 2 years ago. No alcohol abuse. No illicit drug abuse. Currently resides at a skilled nursing facility at [facility].   FAMILY HISTORY: Diabetes and coronary disease.   CURRENT MEDICATIONS: As follows: Tylenol with hydrocodone 5/325, 1 tab every 4 hours needed, Xanax 0.25 mg q. 6 hours as needed, amiodarone 200 mg daily, vitamin C 1000 mg daily, Crestor 20 mg at bedtime, Cardizem CD 180 mg daily, Colace 100 mg b.i.d., Eligard 45 mg subcutaneous every 6 months, iron sulfate 300 mg daily, fish oil 1000 mg daily, Flomax 0.4 mg daily, Lasix 20 mg daily as needed for weight gain greater than 3 pounds in 1 day or 4 pounds in 7 days, guaifenesin 600 mg b.i.d., Xopenex  nebulizer q.i.d. as needed, losartan 25 mg daily, Singulair 10 mg at bedtime, morphine 20 mg per mL, 0.25 mL q.4 hours as needed for pain and air hunger, multivitamin daily, omeprazole 40 mg b.i.d., Os-Cal with  vitamin D 1 tab b.i.d., Senokot 1 tab daily, sertraline 50 mg daily, Spiriva 1 puff daily, Symbicort 160/4.5, two puffs b.i.d. and albuterol inhaler 2 puffs q. 4 hours as needed.   PHYSICAL EXAMINATION ON ADMISSION: Is as follows:  VITAL SIGNS:  Temperature is 97.9, pulse 87, respirations 20, blood  pressure 101/44, sats 96% on 2 liters nasal cannula.  GENERAL: He is a pleasant appearing male in no apparent distress.  HEENT: He is atraumatic, normocephalic. Extraocular muscles are intact. Pupils equal, round and reactive to light. Sclerae are anicteric. No conjunctival injection. No pharyngeal erythema.  NECK: Supple. No jugular venous distention. No bruits, no lymphadenopathy or thyromegaly.  HEART: Regular rate and rhythm. He does have a 2/6 systolic ejection murmur heard at the right sternal border. No rubs,  no clicks.  LUNGS: He has some coarse rhonchi diffusely but no rales, negative use of accessory muscles. No dullness to percussion. Good air entry bilaterally.  ABDOMEN: Soft, flat, nontender, nondistended. Has good bowel sounds. No hepatosplenomegaly appreciated.   EXTREMITIES: No evidence of any cyanosis or clubbing. He does have +2 pitting edema bilaterally from the knees to ankles, +2 pedal and radial pulses bilaterally.  NEUROLOGICAL: The patient is alert, awake and oriented x 3 with no focal motor or sensory deficits bilaterally.  SKIN: Moist and warm with no rash appreciated.   LYMPHATIC: There is no cervical or axillary lymphadenopathy.   LABORATORY, DIAGNOSTIC AND RADIOLOGIC DATA: Glucose of 83, BUN 25, creatinine 1.2, sodium 138, potassium 3.9, chloride 103, bicarbonate 28. The patient's troponin less than 0.02. White cell count 16.6, hemoglobin 9.9, hematocrit 30.4, platelet count 198.   The patient did have a chest x-ray done, which showed a small plug bilateral pleural effusions and pulmonary edema.   ASSESSMENT AND PLAN: This is a 78 year old male with a history of end-stage chronic obstructive pulmonary disease, chronic atrial fibrillation, history of hematuria, history of previous gastrointestinal bleed, peripheral vascular disease, history of coronary artery disease, status post coronary artery bypass graft, history of carotid endarterectomy, history of prostate  cancer, obstructive sleep apnea, iron deficient anemia, presents to the hospital from the cardiologist's office due to hypotension and chest pain.  1.  Hypotension. This is was likely orthostatic hypotension. The exact etiology of this is unclear. Question if this is related to medications he is taking. He is currently on Cardizem and losartan, which I will hold for now. Clinically, he does not appear to be dehydrated, but I will go ahead and gently hydrate him with IV fluids and repeat orthostatics in the morning. Waiting to discuss with cardiology about adjusting his medications for his heart failure and also his atrial fibrillation; therefore, I will consult [place] cardiology for now.  2.  Chest pain. This is likely related to his hypotension and currently his chest pain is resolved. There is no evidence of acute coronary syndrome. His EKG shows no acute ST changes. His first set of cardiac markers was negative. I will place him on off unit telemetry. I will start him on Ranexa,  which the cardiologist wanted to do so and I will consult them for further management of his chest pain for now.  3.  Chronic obstructive pulmonary disease. No evidence of acute exacerbation. Continue Symbicort, Spiriva, as needed DuoNebs. Continue morphine for air hunger.  4.  Chronic atrial fibrillation, currently rate -controlled. I will hold his amiodarone for now, he is not on anticoagulation given his history of hematuria and gastrointestinal bleed. He used to  be on Xarelto.  5. Gastroesophageal reflux disease. Continue Prilosec.  6.  History of iron deficient anemia. Continue iron supplements.  7.  Leukocytosis. This is likely related to the recent steroid taper that he just finished.  I do not appreciate any evidence of acute infectious process, even though he was hypotensive.  His chest x-ray does not show any evidence of pneumonia or bronchitis presently. I will repeat his white cell count in the morning. The patient  has had recurrent hospitalizations in the past month. He has had 4 of them in the month of April and one in February, one in January. I will get palliative care consult to discuss goals of care with the family.   CODE STATUS:  The patient presently is a full code.   TIME SPENT ON ADMISSION: 55 minutes    ____________________________ Belia Heman. Verdell Carmine, MD vjs:cc D: 12/22/2012 17:23:36 ET T: 12/22/2012 17:51:36 ET JOB#: [id number]  cc: Belia Heman. Verdell Carmine, MD, <Dictator> Henreitta Leber MD ELECTRONICALLY SIGNED 12/23/2012 15:27

## 2014-12-09 NOTE — H&P (Signed)
PATIENT NAME:  Javier [name], Javier [name] MR#:  [id number] DATE OF BIRTH:  [birth year]  DATE OF ADMISSION:  12/04/2012  Addendum  In regards to hematuria, prior history hematuria and recent hematuria, I am going to suspend the patient's Xarelto as looking back it appears that patient's estimated GFR is below 50, which is 48, for this particular patient and may even worsen with diuretic use, so we will be suspending Xarelto at this time.     ____________________________ Theodoro Grist, MD rv:ea D: 12/04/2012 18:13:30 ET T: 12/04/2012 23:21:40 ET JOB#: [id number]  cc: Theodoro Grist, MD, <Dictator> Artha Chiasson MD ELECTRONICALLY SIGNED 12/20/2012 18:06

## 2014-12-09 NOTE — Discharge Summary (Signed)
PATIENT NAME:  Javier [name], Javier [name] MR#:  [id number] DATE OF BIRTH:  12/13/1935  DATE OF ADMISSION:  11/26/2012 DATE OF DISCHARGE:  11/30/2012  PRIMARY CARE PHYSICIAN: Francee Nodal, MD   CONSULTATIONS:  1. Dr. Johnsie Cancel. 2. Dr. Rockey Situ from cardiology. 3. Dr. Maudie Mercury from urology.  CHIEF COMPLAINT: Shortness of breath and blood in the urine.   DISCHARGE DIAGNOSES: 1. Hematuria, possibly from prostatitis.  2. Acute on chronic respiratory failure from chronic obstructive pulmonary and diastolic congestive heart failure.  3. Atrial flutter, status post ablation  4. Acute on chronic diastolic dysfunction.  5. History of prostate cancer.  6. History of hypertension.  7. History of coronary artery disease.  8. History of chronic obstructive pulmonary disease.  9. History of obstructive sleep apnea and oxygen at night.  10. History of peripheral vascular disease.   DISCHARGE MEDICATIONS: 1. Symbicort 160/4.5, 2 puffs 2 times a day. 2. Centrum Silver 1 tab daily.  3. Vitamin C 1000 mg daily. 4. Alprazolam 0.25 mg every 6 hours as needed for anxiety.  5. Crestor 20 mg daily. 6. Ventolin HFA 2 puffs 4 times a day as needed. 7. Albuterol/ipratropium 2.5/0.5 mg. Please inhale 3 mL 4 times a day as needed for shortness of breath or wheezing.  8. Isosorbide mononitrate 30 mg daily. 9. Omeprazole 20 mg daily. 10. Fish oil 1000 mg 2 times a day. 11. Ferrous sulfate 325 mg 2 times a day.  12. Nitrostat 0.4 mg sublingual every 5 minutes as needed for chest pain. 13. Montelukast 10 mg daily. 14. Docusate sodium 100 mg 2 times a day. 15. Os-Cal 1 tab 2 times a day. 16. Spiriva 18 mcg 1 cap inhaled daily. 17. Eligard 45 mg subcutaneously every 6 months. 18. Losartan 50 mg daily. 19. Tamsulosin 0.4 mg daily.  20. Xarelto 20 mg daily. 21. Coreg 6.25 mg 2 times a day.  22. Acetaminophen/hydrocodone 325/5 mg every 4 hours as needed for pain. 23. Amiodarone 200 mg daily.  24. Diltiazem 240 mg 1 tab  daily extended-release. 25. Augmentin 1 tab every 12 hours for 10 days. 26. Oxybutynin 5 mg every 6 hours as needed. 27. Lasix 20 mg every other day. 28. Levaquin 250 mg daily for 10 days. 29. Prednisone 20 mg for a day, then 10 mg daily for a day, then stop.   DISCHARGE INSTRUCTIONS AND FOLLOWUP:   1. The patient will be going home with a home health nurse and with a Foley.  2. Diet is low sodium, low fat, low cholesterol.  3. Activity as tolerated.  4. Please follow with PCP within 1 to 2 weeks.  5. Please follow with Dr. Rockey Situ within a week.  6. Please follow with a urologist within a week.   CODE STATUS: The patient is a FULL CODE.   SIGNIFICANT LABORATORY AND RADIOLOGICAL DATA:  Initial BUN 42, creatinine 1.26, sodium 135. LFTs: Alkaline phosphatase was 44, ALT 54, AST is 17. Initial WBC 24.5.  Initial hemoglobin 11.9 and platelets of 191. Lowest hemoglobin was 11.5. INR on arrival is 1.3. Urine cultures as well as blood cultures have been no growth to date.  Urinalysis with 5823 RBCs, 18 WBCs, no bacteria, 3+ blood.   X-ray of the chest, 1 view, showing low-grade compensated CHF.  HISTORY OF PRESENT ILLNESS AND HOSPITAL COURSE: For full details of history and physical, please see the dictation by Dr. Benjie Karvonen on 04/10, but briefly this is a pleasant 79 year old male with history of chronic diastolic CHF, CAD,  COPD, obstructive sleep apnea, prostate cancer, who was just discharged the day prior to admission. He was discharged with a Foley catheter for urinary retention. He did have a-fib/a-flutter at that time and underwent ablation and was on Xarelto. He presented with hematuria and worsening of the shortness of breath. Of note, he was discharged on Lasix p.r.n. at that time, but the hematuria was new. The patient was admitted to the hospitalist service.   Xarelto was held.  The patient was started on Lasix IV as well. In regards to the hematuria, it was likely secondary to the Foley and  prostatitis. He was started on Levaquin again, and the patient was seen by Dr. Maudie Mercury. He has a urologist as an outpatient, and per Dr. Julianne Rice instructions he was discharged on 2 antibiotics, i.e. Levaquin and Augmentin for 10 days until he sees his urologist, who, at that time, could evaluate the patient. He did have some prostate tenderness on exam in the ER. Urine cultures and blood cultures, of note, have been negative.   In regards to his acute on chronic diastolic congestive heart failure, again he was on IV Lasix b.i.d. and had significant urine output. That was transitioned into Lasix daily, and the recommendation by Cardiology was to discharge him on Lasix standing dose.  However, he did have a slight bump in his creatinine the day prior to admission which did improve with holding of the Lasix to 40 mg dose. At this point, he will be discharged with a 20 mg dose every other day until he is seen by his cardiologist. He has been weaned off of oxygen and is doing relatively well. He was also started on steroids, and they have been tapered for his COPD. He did have acute on chronic respiratory failure as well which was multifactorial, likely, i.e. COPD and CHF.  At this point, he will be discharged with home health for further followup.   TOTAL TIME SPENT: 40 minutes.   ____________________________ Vivien Presto, MD sa:cb D: 11/30/2012 15:00:31 ET T: 11/30/2012 15:27:06 ET JOB#: [id number]  cc: Vivien Presto, MD, <Dictator> Francee Nodal, MD  Vivien Presto MD ELECTRONICALLY SIGNED 12/12/2012 13:56

## 2014-12-09 NOTE — H&P (Signed)
PATIENT NAME:  Javier [name], PETTET MR#:  [id number] DATE OF BIRTH:  Aug 12, 1936  DATE OF ADMISSION:  11/17/2012  REFERRING PHYSICIAN: Dr. Lisa Roca.   PRIMARY CARE PHYSICIAN:  Dr. Mila Palmer.   PRIMARY CARDIOLOGIST:  Dr. Esmond Plants.    CHIEF COMPLAINT: Shortness of breath, wheezing, cough.   HISTORY OF PRESENT ILLNESS: The patient is a 79 year old Caucasian male with history of COPD, diastolic CHF, coronary artery disease, hyperlipidemia, sleep apnea, on home oxygen, prostate cancer; has not been feeling well for the last 3 to 4 days, has increased wheezing, cough, white sputum and shortness of breath. He has been given by Dr. Raul Del oral steroids and oral antibiotics to be taken when he feels not good. He started taking it since Saturday and he took it for 3 days for now but still did not feel any improved and he was worsening so came to the Emergency Room. He was found having atrial fibrillation with tachycardia and so he was given injection IV diltiazem once but no response so started on diltiazem drip. He was given nebulizer treatment and oxygen supplementation and feeling a little better so being admitted for atrial fibrillation and COPD exacerbation.   REVIEW OF SYSTEMS:  CONSTITUTIONAL:  Negative for fever, fatigue, weakness, pain or weight loss.  EYES: No blurring, double vision, discharge or redness.  RESPIRATORY: He has some cough and wheezing and mild shortness of breath.  EARS, NOSE, THROAT: No tinnitus, ear pain or hearing loss.  CARDIOVASCULAR: No chest pain or orthopnea. Has some or palpitations but denies any syncopal episode yesterday.  GASTROINTESTINAL:  No nausea, vomiting, diarrhea or abdominal pain.  GENITOURINARY: No dysuria, hematuria or increased frequency.  ENDOCRINOLOGY: No heat or cold intolerance.  SKIN: No acne or rashes.  MUSCULOSKELETAL: No pain or swelling in the joints.  NEUROLOGICAL: No numbness, weakness, tremors or vertigo.  PSYCHIATRIC: No anxiety,  insomnia or depression.   PAST MEDICAL HISTORY: Hypertension, coronary artery disease, hyperlipidemia, CHF, diastolic dysfunction, COPD, obstructive sleep apnea with oxygen at night, peripheral vascular disease, prostatic cancer, status post brachytherapy.   PAST SURGICAL HISTORY: CABG, carotid endarterectomy, cardiac stent, Lasik surgery on the eye.    SOCIAL HISTORY: He quit smoking 2 years ago. Denies any alcohol drinking or illicit drug use.   FAMILY HISTORY: Significant for diabetes and coronary artery disease.   HOME MEDICATIONS: Nitrostat 0.4 mg sublingual every 5 minutes as needed for chest pain, omeprazole 20 mg delayed-release tablet once a day, Os-Cal D 2 times a day. Prednisone 10 mg oral tablet, start with 6 tablets and taper 10 mg daily, he started 3 days ago. Robitussin DM 5 mL oral every 4 hours as needed for cough, Spiriva inhaled once a day, Symbicort 2 puffs 2 times a day, Ventolin 2 puffs 4 times a day as needed for shortness of breath, vitamin C1000 mg oral tablet once a day in the morning, doxycycline 100 mg 2 times a day started 3 days ago.   PHYSICAL EXAMINATION: VITAL SIGNS: Temperature 97.8, pulse rate ranging from 146 to 120, respirations 26 to 22,  blood pressure on presentation was 122/72 but after starting diltiazem IV drip systolic is ranging in 82X.  GENERAL:  He is fully alert and oriented to time, place and person, obese, not in any acute distress, cooperative with history-taking and physical examination.  HEENT:  Atraumatic. Conjunctivae pink. Oral mucosa moist.  NECK: Supple. No JVD.  RESPIRATORY: Bilateral expiratory wheezing present, few crepitations. Equal air entry.  CARDIOVASCULAR: S1, S2  present. Irregular, tachycardia. No murmur appreciated.  ABDOMEN: Soft, nontender. Bowel sounds present. No organomegaly.  SKIN: No rashes.  LEGS: No edema.  NEUROLOGICAL: Power 5 out of 5 all 4 limbs.  PSYCHIATRIC: Does not appear in any acute psychiatric illness.    LABORATORY RESULTS. BNP 2140. Glucose 108, BUN 22, creatinine 1.08, sodium 141, potassium 3.3, chloride 104, CO2 28. WBC 13.3, hemoglobin 11.6, platelet count 188, MCV 89. Chest x-ray, portable, no acute cardiopulmonary disease. Echocardiogram reviewed from last year in the system shows diastolic heart failure, ejection fraction more than 55%.   ASSESSMENT AND PLAN: A 79 year old male with a history of hypertension, coronary artery disease, hyperlipidemia, congestive heart failure, diastolic dysfunction, chronic obstructive pulmonary disease, obstructive sleep apnea, presented with worsening chronic obstructive pulmonary disease and found having new-onset atrial fibrillation.  1.  New-onset atrial fibrillation.  Diltiazem IV push had no response in the ER so started on diltiazem IV drip. Heart rate now slowed down, running in 110 to 120, but blood pressure is running on the lower side, systolic in the 19E. Will place him in Critical Care Unit and monitor. Cardiology consult with Dr. Rockey Situ. Echocardiogram and troponin to be followed every 8 hours. This new-onset atrial fibrillation is possibly due to stress of chronic obstructive pulmonary disease exacerbation.  I do not want to start on any anticoagulation at this time. I will wait for Dr. Donivan Scull input on that.  2.  Chronic obstructive pulmonary disease exacerbation. We will give him IV Solu-Medrol, DuoNeb and Spiriva, also give him antibiotics of Rocephin and Zithromax, and continue him on oxygen supplementation.  3.  Diastolic congestive heart failure, chronic. This is stable currently, no signs or symptoms of heart failure. Will have to hold his baseline medication, ACE inhibitor and Imdur due to his borderline low blood pressure secondary to diltiazem drip. Will continue on aspirin.  4.  Coronary artery disease, status post coronary artery bypass graft. We will have to hold beta blocker and ACE inhibitor. Will just continue aspirin for now.  5.   Hypertension. We will hold oral antihypertensive medication at this time because blood pressure is running on the lower side due to atrial fibrillation and diltiazem IV drip.  6.  Hyperlipidemia. We will check his lipid level and continue his rosuvastatin.  7.  Chronic anemia. We will continue oral supplementation of iron.  8.  Deep vein thrombosis prophylaxis with heparin subcutaneous.  9.  Gastrointestinal prophylaxis with pantoprazole oral.  10.  Healthcare power of attorney is wife, [place], phone number [phone].  11.  CODE STATUS: Full code.   TOTAL CRITICAL CARE TIME SPENT: 60 minutes.    ____________________________ Ceasar Lund Anselm Jungling, MD vgv:cs D: 11/17/2012 17:45:00 ET T: 11/17/2012 18:48:28 ET JOB#: [id number]  cc: Ceasar Lund. Anselm Jungling, MD, <Dictator> Vaughan Basta MD ELECTRONICALLY SIGNED 11/29/2012 22:20

## 2014-12-09 NOTE — Consult Note (Signed)
PATIENT NAME:  Javier [name], Javier [name] MR#:  [id number] DATE OF BIRTH:  09-Feb-1936  DATE OF CONSULTATION:  12/05/2012  REFERRING PHYSICIAN:      Theodoro Grist, MD CONSULTING PHYSICIAN:  Anivea Velasques R. Ma Hillock, MD  REASON FOR CONSULTATION: Leukocytosis, also has thrombocytopenia and anemia.   HISTORY OF PRESENT ILLNESS: The patient is a 79 year old gentleman with past medical history significant for COPD on chronic oxygen, diastolic congestive heart failure, coronary artery disease status post CABG surgery, carotid endarterectomy, cardiac stent, Lasik surgery on his eye, hyperlipidemia, obesity, obstructive sleep apnea, peripheral vascular disease, history of iron deficiency anemia on oral iron therapy, history of atrial flutter, status post ablation, history of prostate cancer status post brachytherapy, hypertension, EGD and colonoscopy March 2014 showed nonbleeding angio ectasias in the duodenum. The patient has had recent hospitalization for prostatitis and hematuria from 11/26/2012 through 11/30/2012. He is currently admitted to the hospital for a complaint of not feeling well, progressive generalized weakness with increasing shortness of breath and lower extremity swelling. The patient has recently finished antibiotics along with a course of prednisone, states that the last was of prednisone was 2 days ago. CBC today shows WBC count elevated at 14,600 with increased neutrophils of 12,200, 9.8% lymphocytes, 5.8% monocytes, hemoglobin 8.9 with normal MCV of 87, platelet count 97,000, creatinine is 1.35. Liver function tests are unremarkable except for low albumin of 2.4, low AST of 12. Prior to this, CBC on April 18th showed a WBC count of 18,300 with platelet count 107 and hemoglobin 9.5, neutrophilia. Prior to this on 11/29/2012, platelet count was low normal range at 165, hemoglobin better at 11.5, WBC 18,200 with ANC of 15,300.   The patient clinically denies any obvious bleeding symptoms, states that hematuria  has improved. He denies any fevers or night sweats. Appetite is good, no unintentional weight loss. He has chronic dyspnea on exertion and weakness and does not ambulate much. He is currently admitted for management of congestive heart failure.   PAST MEDICAL HISTORY/PAST SURGICAL HISTORY: As in history of present illness above.   FAMILY HISTORY: Noncontributory, remarkable for diabetes and heart disease.   SOCIAL HISTORY: Ex-smoker, quit 2 years ago. Denies alcohol or recreational drug usage.     ALLERGIES: No known drug allergies.   HOME MEDICATIONS:  Crestor 10 mg daily, carvedilol 6.25 mg b.i.d., amiodarone 200 mg daily, albuterol/ipratropium inhaler q.i.d. p.r.n., docusate sodium 100 mg b.i.d., ferrous sulfate 325 mg p.o. b.i.d., isosorbide mononitrate 30 mg p.o. at bedtime, Levaquin 250 mg p.o. every 24 hours, montelukast 10 mg p.o. daily, Nitrostat 0.4 mg sublingual p.r.n., Os-Cal 500/400 unit 1 tablet b.i.d., Xarelto 20 mg p.o. daily at bedtime, Symbicort 2 puffs b.i.d., Ventolin inhaler 2 puffs q.i.d. p.r.n., vitamin C 1 gram daily, tamsulosin 0.4 mg daily, Spiriva 18 mcg inhaler daily, oxybutynin 5 mg q.6 hours p.r.n., omeprazole 20 mg daily, losartan 50 mg p.o. daily, Lasix 10 mg p.o. every other day, fish oil 1 gram b.i.d., Eligard 45 mg q.6 month subcutaneous injection, Cardizem CD 240 mg once daily, Centrum Silver 1 daily, Augmentin 1 tablet b.i.d., alprazolam 0.25 mg q.6 hours p.r.n., Norco 325/5 mg 1 tablet q.4 hours p.r.n.   REVIEW OF SYSTEMS: CONSTITUTIONAL: As in history of present illness. No fevers or night sweats.  HEENT: Denies headaches or dizziness at rest. No epistaxis, ear or jaw pain. No new sinus symptoms.  CARDIAC: Has progressive dyspnea and some orthopnea, on chronic oxygen therapy. Denies any chest pain currently or palpitation.  LUNGS: Has  chronic cough and dyspnea on exertion. No hemoptysis.  GASTROINTESTINAL: No nausea, vomiting or diarrhea. No bright red blood  in stools or melena.  GENITOURINARY: No dysuria, hematuria which has recently resolved.  SKIN: Has minor easy bruising, otherwise no rashes or pruritus.  HEMATOLOGIC: As in history of present illness.  MUSCULOSKELETAL: No progressive bone pains. States he has chronic arthritis which is unchanged.  NEUROLOGIC: Denies focal weakness, seizures or loss of consciousness.  ENDOCRINE: No polyuria or polydipsia. Appetite is good.   PHYSICAL EXAMINATION: GENERAL: The patient is a moderately built and well nourished individual, sitting in bed, alert and oriented x 4 and converses appropriately. No acute distress, on nasal cannula oxygen. No icterus. Mild pallor.  VITAL SIGNS: Temperature 99, pulse 78, respiratory rate 20, blood pressure 104/66, 97% on 2 liters.   HEENT: Normocephalic, atraumatic. Extraocular movements intact. Sclera anicteric. No oral thrush or petechiae.  NECK: Negative for lymphadenopathy.  CARDIOVASCULAR: S1 and S2, regular rate and rhythm.  LUNGS: Lungs show bilateral diminished breath sounds overall.  He has bilateral crepitations, no rhonchi.  LYMPHATICS: No adenopathy in the axillary or inguinal areas.  ABDOMEN: Soft, obese, nontender. No hepatosplenomegaly clinically.  MUSCULOSKELETAL: No obvious joint deformity or swelling.  EXTREMITIES: Trace edema. No cyanosis.  NEUROLOGIC: Limited examination. Cranial nerves intact. Moves all extremities spontaneously.   LABORATORY, DIAGNOSTIC AND RADIOLOGIC DATA:  As in history of present illness above.   IMPRESSION AND RECOMMENDATIONS: This is a 79 year old gentleman with history of multiple medical problems as described above, currently admitted with progressive weakness, dyspnea and felt to have recurrent heart failure. The patient had recent admission 04/10 through 04/14 for prostatitis and hematuria and he has been on antibiotics including Augmentin and Levaquin.  He also completed a course of prednisone 2 days ago. Hematology has  been consulted for leukocytosis, he also has known history of iron deficiency anemia on oral iron twice daily. Upon this admission also has developed new onset thrombocytopenia. CBCs in the past have shown normal platelet count, although he has had fluctuating leukocytosis at least since 11/21/2010 when WBC count was 20,900 with ANC of 18,200, hemoglobin 11.2, platelets 201. Recurrent leukocytosis in his case most likely reactive.  Given repeated episodes of sickness and hospitalization, most recently he has been on prednisone and this could cause reactive leukocytosis also.  Regarding leukocytosis, will get serum LDH, lab score and manual differential and the pathologist review of smear and if indicated, he may need further work-up to rule out underlying bone marrow disorder.   He has new onset thrombocytopenia, could be medication-induced since he has recently been on antibiotics but will also check disseminated intravascular coagulopathy panel including PT/PTT/fibrinogen/FDP along with LDH/haptoglobin (the patient otherwise does not have fever, altered mental status or progressive renal failure). Continue to monitor CBC daily and watch for bleeding symptoms.   Regarding anemia, will get B12, folate level and also repeat iron studies since the last one was done about 2 months ago. Also, will get stool Hemoccult upon bowel movement to see if he has  recurrent gastrointestinal blood loss. SIEP unremarkable recently. We will also check serum erythropoietin level. We will continue to follow and make her to the recommendations based upon results of work-up. The patient explained above, agreeable to this plan.   Thank you for the referral. Please feel free to contact me if any additional questions.      ____________________________ Rhett Bannister Ma Hillock, MD srp:ct D: 12/05/2012 10:09:06 ET T: 12/05/2012 10:34:41 ET JOB#: [id number]  cc: Kaetlyn Noa R. Ma Hillock, MD, <Dictator> Alveta Heimlich MD ELECTRONICALLY  SIGNED 12/07/2012 9:47

## 2014-12-09 NOTE — H&P (Signed)
PATIENT NAME:  Javier [name], Javier [name] MR#:  [id number] DATE OF BIRTH:  [birth year]  DATE OF ADMISSION:  02/06/2013  PRIMARY CARE PHYSICIAN:  Dr. Francee Nodal.   CARDIOLOGIST:  Dr. Rockey Situ.   REFERRING PHYSICIAN:  Dr. Francene Castle.   CHIEF COMPLAINT:  Generalized weakness.   HISTORY OF PRESENT ILLNESS:  Javier [name] is a 79 year old Caucasian male who recently had pneumonia, was recovering and went to rehabilitation and just discharged Friday, along with home health nurse to follow up on his condition at home.  He was doing reasonably well until yesterday when he felt generalized weakness, feels wobbly when he is walking.  His legs and arms are shaky when he walks.  The home health nurse checked his blood pressure and upon standing it was 90/60.  The patient was referred to the hospital for further evaluation of his orthostatic blood pressure changes.  Here, the patient appears to be dehydrated from his blood work-up.  He received IV fluids.  Subsequently, his blood pressure in supine was 162/81 with a pulse of 75 and upon standing blood pressure dropped to 136/70 with a pulse of 87.  The patient was admitted for observation and to continue IV hydration and hold his diuretics.  Other than that, the patient denies having any recent chest pain.  No vomiting.  No diarrhea.  No fever.  No chills.  No abdominal pain.  No urinary symptoms.   REVIEW OF SYSTEMS:  CONSTITUTIONAL:  Denies having any fever.  No chills, but he has fatigue, especially upon standing and walking.  EYES:  No blurring of vision.  No double vision.  EARS, NOSE, THROAT:  No hearing impairment.  No sore throat.  No dysphagia.  CARDIOVASCULAR:  No chest pain.  No shortness of breath.  No syncope.  RESPIRATORY:  No cough.  No sputum production.  No hemoptysis.  No chest pain.  GASTROINTESTINAL:  No abdominal pain, no vomiting, no diarrhea.  GENITOURINARY:  No dysuria.  No frequency of urination.  MUSCULOSKELETAL:  No joint pain or  swelling.  No muscular pain or swelling.  INTEGUMENTARY:  No skin rash.  No ulcers.  NEUROLOGY:  No focal weakness.  No seizure activity.  No headache.  PSYCHIATRY:  Denies anxiety or depression.  ENDOCRINE:  No polyuria or polydipsia.  No heat or cold intolerance.   PAST MEDICAL HISTORY:  The patient is recovering from recent pneumonia.  Last admission here was in April of this year, admitted with acute on chronic diastolic congestive heart failure.  The patient has chronic obstructive pulmonary disease, chronic atrial fibrillation.  At one point he was on Xarelto for anticoagulation, but that was discontinued due to anemia and hematuria.  Systemic hypertension, history of prostatic cancer, history of coronary artery disease, status post coronary artery bypass graft in 2008.  Subsequently, he had multiple stent implants.  History of carotid endarterectomy and TIA, history of peripheral vascular disease, obesity.  Obstructive sleep apnea syndrome.  The patient is on oxygen 2 liters using nasal cannula at night for the last six months.  He is not placed on CPAP.   PAST SURGICAL HISTORY:  Coronary artery bypass graft, carotid endarterectomy, cardiac stentings and prostatic surgery and LASIK eye surgery.   SOCIAL HABITS:  Ex-chronic smoker.  He quit two years ago, used to smoke 1 pack every three days since age of 74.  No history of alcohol or other drug abuse.   SOCIAL HISTORY:  He is retired from [employer], operational  management in a company.  The patient is married.  He is living with his wife and his daughter and his son-in-law.   FAMILY HISTORY:  His father died from complications of throat and esophageal cancer.  His mother died at age of 57 from a heart attack.   ADMISSION MEDICATIONS:  Morphine, however the patient tells me he is not taking it.  Multivitamin 1 tablet once a day.  Prilosec 40 mg twice a day, potassium chloride 10 mEq once a day, senna 1 tablet once a day, Colace 100 mg twice a  day, Singulair 10 mg once a day.  Spiriva 1 inhalation once a day.  Symbicort 2 puffs twice a day.  Ventolin inhaler 2 puffs q. 4 hours as needed, vitamin C 2 tablets in the morning, Xanax 0.25 mg q. 6 hours as needed for anxiety, Zoloft 50 mg once a day, Aldactone 25 mg once a day, amiodarone 200 mg twice a day and calcium 500 mg with vitamin D twice a day, Crestor 20 mg a day, diltiazem 120 mg once a day, iron 325 mg twice a day, fish oil 500 mg 2 tablets a day, Norco 5/325 q. 4 hours as needed for pain, Lasix 20 mg a day as needed, losartan 25 mg once a day.  Levalbuterol nebulization as needed.  Eligard 45 mg subcutaneous q. 6 months.  Oxygen 2 liters nasal cannula at night only.   ALLERGIES:  PREDNISONE, THE REACTION IS UNCLEAR.   PHYSICAL EXAMINATION: VITAL SIGNS:  Again, his blood pressure at home was 90/60.  Here, his blood pressure supine position was 162/81 with a pulse of 75 and sitting was 146/81 with a pulse of 80 and standing blood pressure 136/70 with a pulse of 87.  His O2 saturation was 97%.  Temperature 97.8, respiratory rate 18.  GENERAL APPEARANCE:  Elderly male lying in bed in no acute distress.  HEAD AND NECK:  No pallor.  No icterus.  No cyanosis.  Ear examination revealed normal hearing, no discharge, no lesions.  Examination of the nose showed no discharge, no bleeding, no ulcers.  Oropharyngeal examination revealed normal lips and tongue, no oral thrush, no ulcers.  Eye examination revealed normal eyelids and conjunctivae.  Pupils about 5 mm, round, equal and reactive to light.  NECK:  Supple.  Trachea at midline.  No thyromegaly.  No cervical lymphadenopathy.  HEART:  Normal S1, S2.  No S3, S4.  Irregular S1, S2.  No carotid bruits.  RESPIRATORY:  Revealed normal breathing pattern without use of accessory muscles.  No rales.  No wheezing.  ABDOMEN:  Soft without tenderness.  No hepatosplenomegaly.  No masses.  No hernias.  SKIN:  Revealed no ulcers.  No subcutaneous nodules.   MUSCULOSKELETAL:  No joint swelling.  No clubbing.  NEUROLOGIC:  Cranial nerves II through XII were intact.  No focal motor deficit.  PSYCHIATRIC:  The patient is alert and oriented x 3.  Mood and affect were normal.   LABORATORY FINDINGS:  Chest x-ray showed no acute cardiopulmonary abnormalities.  Evidence of cardiomegaly.  His EKG showed atrial fibrillation with controlled ventricular rate at 80 per minute.  His blood work-up showed a glucose of 98, BUN 25, creatinine 1.4, sodium 135, potassium 4.5.  His calcium 9.2.  Normal liver function tests and liver transaminases.  CBC showed elevated white count reaching 16,000.  Of note, his white count on May 6th was 16,000.  On May 7th was 16 again, on May 9th was 9000.  His hemoglobin 12, hematocrit 37, platelet count 213.  Urinalysis was unremarkable.   ASSESSMENT: 1.  Generalized weakness associated with orthostatic blood pressure dropping secondary to likely dehydration.  2.  Dehydration.  3.  Nonspecific elevation of white blood cell count without a finding of any focus of infection.  4.  Chronic atrial fibrillation.  5.  Coronary artery disease.  6.  Chronic obstructive pulmonary disease.  7.  Obstructive sleep apnea syndrome, on oxygen 2 liters at night, nasal cannula.  8.  His other medical problems include systemic hypertension, peripheral vascular disease, obesity, iron deficiency anemia, history of transient ischemic attack, history of prostatic cancer.   PLAN:  We will admit the patient for observation.  I will hold Lasix and Aldactone.  Gentle IV hydration with normal saline at 80 mL per hour with careful monitoring to avoid volume overload and development of congestive heart failure.  Repeat orthostatic blood pressure findings in the morning.  I will continue the rest of his home medications.  Ted stockings for deep vein thrombosis prophylaxis.  Of note, the patient was on Xarelto a few months ago and it was discontinued due to  development of anemia and hematuria.  The patient indicates that he has a LIVING WILL and his code status is FULL CODE unless his condition is incurable or he is in a vegetative state, then he does not want to be kept indefinitely on life support.  Therefore his code is status right now is FULL CODE.   Time spent in evaluating this patient took more than 1 hour.  This includes reviewing his medical records.     ____________________________ Clovis Pu. Lenore Manner, MD amd:ea D: 02/06/2013 02:06:33 ET T: 02/06/2013 04:38:44 ET JOB#: [id number]  cc: Clovis Pu. Lenore Manner, MD, <Dictator> Mike Craze Irven Coe MD ELECTRONICALLY SIGNED 02/06/2013 5:30

## 2014-12-09 NOTE — Consult Note (Signed)
General Aspect 79 year old male with history of smoking 40 years, severe asymmetric LVH with moderate LVOT gradient,  COPD, coronary artery disease with bypass surgery in 2007, occlusion of 2 vein grafts with PCI x4, peripheral vascular disease with bilateral carotid endarterectomies in 2002, history of TIA in 2002 who haspresented several times to [facility] with COPD exacerbation,  TIA, episode of COPD exacerbation/bronchitis also with episode of shortness of breath and chest pain that took him to [facility] where he had a cardiac catheterization showing patent vessels with no significant progression of his disease, who presents for increasing SOB, new onset atrial flutter.  He reports increasing SOB and discomfort starting 36 hours ago, could not get comfortable in bed, polyuria, poor sleep. Severe SOB yesterday at 6 AM. Could not walk very far without having to stop. He started ABX and steroids by himself last saturday/sunday for cough, concern for bronchittis (daughter has bronchitis). He called EMTs yesterday AM for SOB.   Recent EGD and colonoscopy for guaiac positive stools,  by Dr. Vira Agar with bx and caudery by report.    Prior echo with asymmetric LVH severe, diastolic relaxation abnormality, normal systolic function, mild MR significant chordal SAM with LVOT gradient of 3 m/s and gradient of 35 mm of mercury and increases to gradient of 49 with Valsalva    Frequent COPD exacerbations, often requiring hospitalization Often needing long courses of antibiotics and steroids   hospitalization in May 2013 chest pain, COPD exacerbation. Stress test showed no ischemia. Echocardiogram was essentially normal with mild MR. received significant IV fluids through his hospital course. Edema got worse through the next week with significant pain in his legs. This improved on outpatient diuretics. Lasix held periodically for low blood pressure Found to be very iron deficient, very anemic. Labs show iron level  XVI, hematocrit 24 BNP 400   Present Illness He reports having recent admission to the hospital 08/31/2012 for shortness of breath. Treated for COPD exacerbation, started on Levaquin IV steroids. CT scan of the chest showing stable pulmonary nodule. No recent chest pain. Edema has improved but still with mild swelling. He is currently taking Lasix daily, but not on a regular basis.   He was previously found to be resistant to Plavix by P2 Y. 12 assay  at [facility]   prior Carotid ultrasound done in the hospital 01/12/2012 shows no significant carotid disease    EKG in the office last week was normal sinus rhythm with rate 71 beats per minute with no significant ST or T wave changes  PAST SURGICAL HISTORY: CABG, carotid endarterectomy, cardiac stent, Lasik surgery on the eye.    SOCIAL HISTORY: He quit smoking 2 years ago. Denies any alcohol drinking or illicit drug use.   FAMILY HISTORY: Significant for diabetes and coronary artery disease.   Physical Exam:  GEN well developed, well nourished, no acute distress   HEENT red conjunctivae   NECK supple   RESP postive use of accessory muscles  rhonchi   CARD Regular rate and rhythm  Murmur   Murmur Systolic   ABD denies tenderness  soft   LYMPH negative neck   EXTR negative edema   SKIN normal to palpation   NEURO motor/sensory function intact   PSYCH alert, A+O to time, place, person, good insight   Review of Systems:  Subjective/Chief Complaint SOB   General: Fatigue   Skin: No Complaints   ENT: No Complaints   Eyes: No Complaints   Neck: No Complaints  Respiratory: Frequent cough  Short of breath   Cardiovascular: Tightness  Palpitations  Dyspnea   Gastrointestinal: No Complaints   Genitourinary: No Complaints   Vascular: No Complaints   Musculoskeletal: No Complaints   Neurologic: No Complaints   Hematologic: No Complaints   Endocrine: No Complaints   Psychiatric: No Complaints   Review of  Systems: All other systems were reviewed and found to be negative   Medications/Allergies Reviewed Medications/Allergies reviewed     Anemia, Chronic:    CVA/Stroke:    MI - Myocardial Infarct:    prostate cancer with brachytherapy:    prostate cancer with bradytherapy:    peripheral vascular disease:    HTN:    CAD:    COPD:    Bronchitis:    High cholesterol:    angina:    triple bypass:    lasik surgery both eyes:    4 cardiac stents:    Carotid Endarterectomy:        Admit Diagnosis:   PERSISTENT ATRIAL FIBRILLATION: Onset Date: 18-Nov-2012, Status: Active, Description: PERSISTENT ATRIAL FIBRILLATION  Home Medications: Medication Instructions Status  doxycycline hyclate hyclate 100 mg oral capsule 1 cap(s) orally 2 times a day for 10 days Active  predniSONE 10 mg oral tablet 6 tab(s) orally once a day (in the morning) on day 1, then 5 tablets daily on day 2, then 4 tablets daily on day 3, then 3 tablets daily on day 4, then 2 tablets daily on day 5, then 1 tablet daily on day 6 Active  Symbicort 160 mcg-4.5 mcg/inh inhalation aerosol 2 puff(s) inhaled 2 times a day Active  alprazolam 0.25 mg oral tablet 1 tab(s) orally every 6 hours as needed for anxiety Active  Centrum Silver oral tablet 1 tab(s) orally once a day (in the morning) Active  Vitamin C 1000 mg oral tablet 1 tab(s) orally once a day (in the morning) Active  carvedilol 6.25 mg oral tablet 1 tab(s) orally 2 times a day Active  Fish Oil 1000 mg oral capsule 1 cap(s) orally 2 times a day (morning and evening). Active  clonidine 0.2 mg oral tablet 0.5 tab(s) orally 2 times a day (morning and evening) Active  amlodipine 10 mg oral tablet 0.5 tab(s) orally once a day (in the evening) Active  Crestor 20 mg oral tablet 1 tab(s) orally once a day (in the evening) Active  Ventolin HFA 90 mcg/inh inhalation aerosol 2 puff(s) inhaled 4 times a day as needed for shortness of breath Active   albuterol-ipratropium 2.5 mg-0.5 mg/3 mL inhalation solution 3 milliliter(s) inhaled 4 times a day, As Needed- for Shortness of Breath , for Wheezing  Active  furosemide 20 mg oral tablet 1 tab(s) orally once a day as needed for swelling Active  acetaminophen-tramadol 325 mg-37.5 mg oral tablet 1 tab(s) orally 1 to 2 times a day as needed for joint pain Active  Mucinex 600 mg oral tablet, extended release 1 tab(s) orally 2 times a day as needed for congestion Active  isosorbide mononitrate 30 mg oral tablet, extended release 1 tab(s) orally once a day (in the evening) Active  omeprazole 20 mg oral delayed release tablet 1 tab(s) orally once a day (in the morning) Active  acetaminophen-hydrocodone 325 mg-5 mg oral tablet 1 tab(s) orally every 4 hours, As Needed for severe pain from coughing Active  codeine-guaifenesin 10 mg-100 mg/5 mL oral syrup 10 milliliter(s) orally every 4 hours, As Needed for pain from coughing Active  Aspirin Enteric Coated 81  mg oral delayed release tablet 1 tab(s) orally once a day for 4 weeks Active  montelukast 10 mg oral tablet 1 tab(s) orally once a day (in the evening) Active  docusate sodium 100 mg oral tablet 1 tab(s) orally 2 times a day (morning and evening) Active  Os-Cal 500 + D 500 mg-400 intl units oral tablet, chewable 1 tab(s) orally 2 times a day (morning and evening) Active  Spiriva 18 mcg inhalation capsule 1 each inhaled once a day Active  Robitussin DM Sugar Free 10 mg-100 mg/5 mL oral liquid 5 milliliter(s) orally every 4 hours, As Needed for cough Active  Eligard 45 mg/6 months subcutaneous injection, extended release 45 milligram(s) subcutaneous every 6 months Active  Nicotrol Inhaler 10 mg inhalation device 10 milligram(s) inhaled every 2 hours, As Needed for smoking cessation Active  ferrous sulfate 325 mg oral tablet 1 tab(s) orally 2 times a day Active  Nitrostat 0.4 mg sublingual tablet 1 tab(s) sublingual every 5 minutes, As Needed as needed  for chest pain if no relief call md or go to emergency room **maximum 3 tablets** Active   Lab Results:  Routine Chem:  02-Apr-14 04:19   Hemoglobin A1c ([facility]) 5.8 (The American Diabetes Association recommends that a primary goal of therapy should be <7% and that physicians should reevaluate the treatment regimen in patients with HbA1c values consistently >8%.)  Glucose, Serum  207  BUN  31  Creatinine (comp) 1.28  Sodium, Serum 138  Potassium, Serum 3.9  Chloride, Serum 102  CO2, Serum 27  Calcium (Total), Serum 8.7  Anion Gap 9  Osmolality (calc) 288  eGFR (African American) >60  eGFR (Non-African American)  55 (eGFR values <25m/min/1.73 m2 may be an indication of chronic kidney disease (CKD). Calculated eGFR is useful in patients with stable renal function. The eGFR calculation will not be reliable in acutely ill patients when serum creatinine is changing rapidly. It is not useful in  patients on dialysis. The eGFR calculation may not be applicable to patients at the low and high extremes of body sizes, pregnant women, and vegetarians.)  Magnesium, Serum 2.1 (1.8-2.4 THERAPEUTIC RANGE: 4-7 mg/dL TOXIC: > 10 mg/dL  -----------------------)  Cholesterol, Serum 166  Triglycerides, Serum 113  HDL (INHOUSE) 59  VLDL Cholesterol Calculated 23  LDL Cholesterol Calculated 84 (Result(s) reported on 18 Nov 2012 at 06:19AM.)  Cardiac:  02-Apr-14 04:19   Troponin I 0.03 (0.00-0.05 0.05 ng/mL or less: NEGATIVE  Repeat testing in 3-6 hrs  if clinically indicated. >0.05 ng/mL: POTENTIAL  MYOCARDIAL INJURY. Repeat  testing in 3-6 hrs if  clinically indicated. NOTE: An increase or decrease  of 30% or more on serial  testing suggests a  clinically important change)  Routine Hem:  02-Apr-14 04:19   WBC (CBC) 9.7  RBC (CBC)  4.07  Hemoglobin (CBC)  11.9  Hematocrit (CBC)  35.8  Platelet Count (CBC) 183  MCV 88  MCH 29.1  MCHC 33.1  RDW  15.9  Neutrophil % 85.9   Lymphocyte % 10.4  Monocyte % 3.7  Eosinophil % 0.0  Basophil % 0.0  Neutrophil #  8.3  Lymphocyte # 1.0  Monocyte # 0.4  Eosinophil # 0.0  Basophil # 0.0 (Result(s) reported on 18 Nov 2012 at 05:22AM.)   EKG:  Interpretation EKG shows atrial flutter, rate 113   Radiology Results: XRay:    01-Apr-14 11:56, Chest Portable Single View  Chest Portable Single View   REASON FOR EXAM:    Shortness  of Breath  COMMENTS:       PROCEDURE: DXR - DXR PORTABLE CHEST SINGLE VIEW  - Nov 17 2012 11:56AM     RESULT: Comparison is made to the study of 08 October 2012.    CABG changes are present. Cardiac monitoring electrodes are present. The   lungs are clear. The heart and pulmonary vessels are normal. The bony and   mediastinal structures are unremarkable. There is no effusion. There is   no pneumothorax or evidence of congestive failure.    IMPRESSION:  No acute cardiopulmonary disease.    Dictation Site: 2    Verified By: Sundra Aland, M.D., MD    No Known Allergies:   Vital Signs/Nurse's Notes: **Vital Signs.:   02-Apr-14 04:00  Temperature Temperature (F) 97.5    05:00  Vital Signs Type Routine  Pulse Pulse 78  Respirations Respirations 24  Systolic BP Systolic BP 161  Diastolic BP (mmHg) Diastolic BP (mmHg) 70  Mean BP 93  Pulse Ox % Pulse Ox % 95  Pulse Ox Activity Level  At rest  Oxygen Delivery 2L  Pulse Ox Heart Rate 80    Impression 79 year old male with history of smoking 40 years, severe asymmetric LVH with moderate LVOT gradient,  COPD, coronary artery disease with bypass surgery in 2007, occlusion of 2 vein grafts with PCI x4, peripheral vascular disease with bilateral carotid endarterectomies in 2002, history of TIA in 2002 who has[facility]ed several [facility] to [facility] with COPD exacerbation,  TIA, episode of COPD exacerbation/bronchitis also with episode of shortness of breath and chest pain that took him to [facility] where he had a cardiac  catheterization showing patent vessels with no significant progression of his disease, who presents for increasing SOB, new onset atrial flutter.  1)Respiratory distress Likely from Atrial flutter likely converted to atrial flutter 3/31 in the PM, possibly 4/1 early AM . He has eaten breakfast today. Poor candidate for cardioversion and anticoagulation given recent caudery/GI lesions one week ago.  He is sitting up in bed, appears somewhat comfortable, some SOB. ---Suggest we try to rate control for now (hold amlodipine, start diltiazem 120 mg daily, continue b-blocker) hold diltiazem IV infusion --Let his GI lesions heal for several weeks, then cautiously start anticoagulation,  could consider TEE to decrease time on anticoagulation,  then cardiovert as an outpt  2) COPD: long smoking hx increasing SOB started 10 days ago, he put himself on prednisone and ABX  3)severe asymmetric LVH with moderate LVOT gradient echo pending Will need to avoid excessive diuresis  4)CAD, CABG some chest tightness in the setting of atrial flutter Would monitor cardiac enz low dose asa 81 mg daily   Electronic Signatures: Ida Rogue (MD)  (Signed 02-Apr-14 09:58)  Authored: General Aspect/Present Illness, History and Physical Exam, Review of System, Past Medical History, Health Issues, Home Medications, Labs, EKG , Radiology, Allergies, Vital Signs/Nurse's Notes, Impression/Plan   Last Updated: 02-Apr-14 09:58 by Ida Rogue (MD)

## 2014-12-09 NOTE — H&P (Signed)
PATIENT NAME:  Javier [name], Javier [name] MR#:  [id number] DATE OF BIRTH:  20-Sep-1935  DATE OF ADMISSION:  11/26/2012  PRIMARY CARE PHYSICIAN: Francee Nodal, MD   CHIEF COMPLAINT: Hematuria and shortness of breath.   HISTORY OF PRESENT ILLNESS: This is a very pleasant 79 year old male with a history of diastolic congestive heart failure, coronary artery disease, COPD, sleep apnea on home oxygen, and prostate cancer status post brachytherapy.  He was  just discharged yesterday for new onset atrial fibrillation as well as urinary retention. He was seen by Dr. Rockey Situ for his new onset atrial fibrillation and was on heparin in the hospital and discharged with Xarelto.  He also was seen by Dr. Jacqlyn Larsen due to his urinary retention. As far as his history of prostate cancer, according to Dr. Bjorn Loser last note, his PSA is actually 0.2, which is good. He was discharged yesterday, and he said he was very well when he was discharged. He was not having any issues; however, today he had gross hematuria and was concerned, so he came to the ER.  In the ER, he did have 2 urinals full of hematuria;  now it is resolving. He denies that he pulled his Foley.   REVIEW OF SYSTEMS:   CONSTITUTIONAL: No fever, fatigue, weakness, weight loss or weight gain.  EYES:  No blurred or double vision.  EARS, NOSE AND THROAT:  No ear pain, hearing loss, seasonal allergies. He does snore.  RESPIRATORY: He does have a cough, and he has wheezing. He has a history of COPD, and he does have some dyspnea.  CARDIOVASCULAR: No chest pain, orthopnea. He does have edema which is new, and he does have dyspnea on exertion. No palpitations or syncope.  GASTROINTESTINAL: No nausea, vomiting, diarrhea, abdominal pain, melena or ulcers.  GENITOURINARY: Positive hematuria. No dysuria.  HEMATOLOGIC/LYMPHATIC: He has chronic anemia.  ENDOCRINE: No polyuria or polydipsia.  SKIN: No rashes or lesions.  MUSCULOSKELETAL: No pain in the shoulders or knees. No  redness or gout. NEUROLOGICAL:  No history of CVA or TIA or seizures.   PSYCHIATRIC:  No history of anxiety or depression.   PAST MEDICAL HISTORY: 1. The patient was diagnosed with new onset atrial fibrillation from 04/01 through 11/24/2012. The patient was cardioverted during his last hospitalization. 2. History of diastolic heart failure.  3. Hypertension.  4. History of CAD.  5. History of COPD.  6. Obstructive sleep apnea with oxygen at night.  7. History of peripheral vascular disease.  8. History of prostate cancer, status post brachytherapy.   PAST SURGICAL HISTORY:  1. CABG.  2. CEA.  3. Cardiac stent.  4. Lasik eye surgery.   SOCIAL HISTORY: The patient quit smoking about 2 years ago. No alcohol or IV drug use.   FAMILY HISTORY: Positive for CAD, diabetes.   MEDICATIONS:  The patient was discharged with the following medications: 1. Symbicort 160/4.5, 2 puffs b.i.d.  2. Centrum Silver 1 tab daily.  3. Vitamin C 1 gram daily.  4. Xanax 0.25 mg q. 6 hours p.r.n. anxiety.  5. Crestor 20 mg daily.  6. Ventolin 2 puffs 4 times a day.  7. Combivent 4 times a day p.r.n.   8. Lasix 20 mg p.r.n. for swelling.   9. Tramadol/acetaminophen 37.5/325, 1 tablet as needed for joint pains b.i.d.  10. Imdur 30 mg at bedtime.  11. Omeprazole 20 mg daily.  12. Fish oil 1 gram b.i.d.  13. Ferrous sulfate 325 b.i.d.  14. Nitrostat sublingual p.r.n. chest  pain.  15. Singulair 10 daily mg daily.  16. Colace p.r.n.  17. Os-Cal 1 tablet b.i.d.  18. Eligard 45 mg every 6 months for prostate cancer.  19. Losartan 50 mg daily.  20. Tamsulosin 0.4 mg daily.  21. Amiodarone 200 mg b.i.d.  22. Cardizem 300 mg daily.  23. Xarelto 20 mg daily for 4 weeks.  24. Nystatin swish and spit for oral thrush.  25. Coreg 6.25 b.i.d.  26. Augmentin 875/125 b.i.d. for 10 days.  27. Prednisone taper.   ALLERGIES: No known drug allergies.   PHYSICAL EXAMINATION: VITAL SIGNS: Temperature 96.9, pulse  is 59, respirations 18, blood pressure 119/61, 94% on room air.  GENERAL: The patient is alert, oriented, not in acute distress.  HEENT: Head is atraumatic. Pupils are round.  Sclerae are anicteric. Mucous membranes are moist. Oropharynx is clear.  NECK: Supple without JVD, carotid bruit or enlarged thyroid.  CARDIOVASCULAR: Regular.  He has sinus brady with no irregular heartbeat. PMI is hard to palpate. No murmurs, gallops or rubs.  LUNGS: He has got crackles at the bases with rhonchi and wheezing  ABDOMEN: Obese, bowel sounds are positive. Nontender, nondistended, no hepatosplenomegaly.  EXTREMITIES: He has 2+ pitting edema bilaterally and symmetrically.  GENITOURINARY:  He does have a Foley catheter placed. On prostate exam by the ER physician, the patient was very tender when the ER physician did a prostate exam.  NEUROLOGICAL:   Cranial nerves II through XII are grossly intact. No focal deficits.  SKIN: Without rash or lesions.   LABORATORY AND RADIOLOGICAL DATA:  Sodium 135, potassium 4.9, chloride 101, bicarbonate 27, BUN 42, creatinine 1.26, glucose 215, bilirubin 0.4, alkaline phosphatase 44, ALT 54, AST 17, total protein 6.0, albumin 2.8. White blood cells 24.5, hemoglobin 12, hematocrit 37.1, platelets are 191. INR is 1.3. Urinalysis shows 3+ blood, 100 protein, no LCE or no nitrite. Red blood cells 5823, 18 white blood cells.  Chest x-ray shows mild congestive heart failure.  EKG is sinus bradycardia with a heart rate of 56.   ASSESSMENT AND PLAN: The patient is a 79 year old male who was just discharged from the hospital with new onset atrial fibrillation, status post cardioversion, on Xarelto, and also urinary retention with history of prostate cancer who presents with hematuria and acute on chronic diastolic heart failure.   1. Hematuria:  Likely the patient pulled his Foley; however, he is also noted to be on Xarelto.  He just had a cardioversion, so he probably needs the Xarelto;  but for today, I will go head and hold it.  We will have Cardiology see the patient in consultation regarding Xarelto, also consult Dr. Jacqlyn Larsen in regards to his hematuria. His hematuria seems to have resolved at this time, but we will monitor it closely. No indication for CBI at this time.  2. Prostatitis:  The patient apparently is very tender to palpation. He has an elevated white blood cell count, probably has a prostate infection. We will continue Rocephin and monitor his CBC carefully.  3. New onset atrial fibrillation, status post cardioversion during this recent hospitalization:  As mentioned, we will consult Dr. Rockey Situ.  He is in sinus bradycardia, so will need to monitor  his heart rate as the patient is also on Cardizem.  4. Leukocytosis:  Likely secondary to prostatitis. We will continue to monitor.  The patient will be on antibiotics.  5. History of obstructive sleep apnea:  We will provide O2 p.r.n.  6. Acute on chronic diastolic  heart failure:  The patient's chest x-ray is consistent with heart failure as well as physical exam.  We will use Lasix and monitor I's and O's and daily weights, continue his outpatient medications.  7. Chronic obstructive pulmonary disease: The patient was recently treated for COPD.  We will continue steroid taper.   CODE STATUS: The patient is a FULL CODE STATUS.     TIME SPENT: Approximately 50 minutes.  ____________________________ Donell Beers. Benjie Karvonen, MD spm:cb D: 11/26/2012 20:39:32 ET T: 11/26/2012 21:38:48 ET JOB#: [id number]  cc: Evonda Enge P. Benjie Karvonen, MD, <Dictator> Francee Nodal, MD Minna Merritts, MD Denice Bors. Jacqlyn Larsen, MD Donell Beers Emilly Lavey MD ELECTRONICALLY SIGNED 11/26/2012 22:38

## 2014-12-09 NOTE — Discharge Summary (Signed)
DATE OF BIRTH:  [birth year]  DATE OF ADMISSION:  10/08/2012 DATE OF DISCHARGE:  10/12/2012  DISCHARGE DIAGNOSES:   1.  Acute-on-chronic chronic obstructive pulmonary disease exacerbation, now improved. 2.  History of chronic diastolic heart failure, currently well compensated, can resume Lasix at home. 3.  Acute renal failure, now improved, likely prerenal. 4. Dysphagia, possible yeast infection due to steroids. Patient would like to get Mycostatin, which is prescribed.   SECONDARY DIAGNOSES: 1.  Hypertension. 2.  Coronary artery disease. 3.  Hyperlipidemia.  4.  Congestive heart failure. 5.  Diastolic dysfunction.  6.  Chronic obstructive pulmonary disease. 7.  Obstructive sleep apnea.  8.  Peripheral vascular disease. 9.  Prostate cancer, status post brachytherapy.   CONSULTATIONS:  None.   PROCEDURES AND RADIOLOGY: Chest x-ray on the 20th of February showed no acute cardiopulmonary disease.   MAJOR LABORATORY PANEL:  UA on admission was negative.   Blood cultures x 2 were negative on the 20th of February.   HISTORY AND SHORT HOSPITAL COURSE: The patient is a 79 year old male with the above-mentioned medical problems, admitted for acute-on-chronic COPD exacerbation. The patient was slowly improving with steroids, and he also had some acute renal failure, which was thought to be prerenal in etiology, was improving with IV fluids. With his steroid, he was getting some oral thrush and yeast, for which he was started on Mycostatin, which seemed to help him. He was close to his baseline, and was discharged home on the 24th of February in stable condition. On the date of discharge, vital signs were as follows: Temperature 97.9, heart rate 73 per minute, respirations 20 per minute, blood pressure 111/68 mmHg, and he was saturating at 94% on room air.    PHYSICAL EXAMINATION ON THE DAY OF DISCHARGE:  CARDIOVASCULAR:  S1, S2 normal. No murmurs, rubs or gallop.  LUNGS:  Clear to  auscultation bilaterally. No wheezing, rales, rhonchi or crepitation.  ABDOMEN:  Soft, benign.  NEUROLOGIC: Nonfocal examination.  All other physical examination remained at baseline.   DISCHARGE MEDICATTIONS:   1.  Symbicort 2 puffs inhaled twice a day. 2.  Zetia 10 mg 1/2 tablet p.o. daily.  3.  Centrum Silver once daily.  4.  Vitamin C 1000 mg p.o. daily. 5.   Os-Cal plus vitamin D 1 tablet p.o. b.i.d.  6.  Spiriva Once daily.   7.  Alprazolam 0.25 mg p.o. every 6 hours as needed.  8.  Clonidine 0.2 mg 1/2 tablet p.o. b.i.d.  9.  Amlodipine 5 mg p.o. daily.  10.  Crestor 20 mg p.o. daily. 11.  Ventolin 2 puffs inhaled 4 times a day as needed.  12.  DuoNeb inhaled 4 times a day as needed.  13.  Lasix 20 mg p.o. daily as needed.  14.  Acetaminophen/tramadol 3.5/37.5 at 1 tablet p.o. 1 to 2 times a day as needed.  15.  Mucinex 600 mg p.o. b.i.d. as needed. 16.  Isosorbide mononitrate 30 mg p.o. daily. 17.  Omeprazole 20 mg p.o. daily.  18.  Coreg 6.25 mg p.o. b.i.d.  19.  Aspirin 81 mg 2 tablets p.o. daily. 20.  Fish oil 1000 mg p.o. b.i.d.  21.  Montelukast 10 mg p.o. daily. 22.  Nicotrol inhaler 10 mg every 2 hours as needed. 23.  Colace 100 mg p.o. b.i.d.  24.  Robitussin DM 10 mL p.o. every 4 hours as needed.  25.  Acetaminophen/hydrocodone 325/5 mg 1 tablet p.o. every 4 hours as needed. 26.  Codeine/guaifenesin  10 mL p.o. every 4 hours as needed.  27.  Nystatin 100,000 units 5 mL p.o. b.i.d. for 7 days. 28.  Prednisone 60 mg p.o. daily, taper 10 mg daily until finished.  29.  Iron sulfate 325 mg p.o. b.i.d.  30.  Nitrostat 0.4 mg sublingual every 5 minutes as needed. 31.  Eligard 1 dose subcutaneous every 6 months.   DISCHARGE DIET: Regular.   DISCHARGE ACTIVITY: As tolerated.   DISCHARGE INSTRUCTIONS AND FOLLOWUP:  The patient was instructed to follow up with his primary care physician, Dr. Mila Palmer, in 1 to 2 weeks. He will need followup with Dr. Rockey Situ in 2 to 4  weeks, and Dr. Inez Pilgrim from Oncology in 4 to 6 weeks. He will need followup with Dr. Raul Del from Pulmonary as scheduled.    Total time discharging this patient was 55 minutes.     ____________________________ Lucina Mellow. Manuella Ghazi, MD vss:mr D: 10/13/2012 17:30:00 ET T: 10/13/2012 19:42:59 ET JOB#: [id number]  cc: Annita Ratliff S. Manuella Ghazi, MD, <Dictator> Francee Nodal, MD Minna Merritts, MD Herbon E. Raul Del, MD Simonne Come Inez Pilgrim, MD     Remer Macho MD ELECTRONICALLY SIGNED 10/15/2012 13:59

## 2014-12-09 NOTE — Consult Note (Signed)
PATIENT NAME:  Javier [name], Javier [name] MR#:  [id number] DATE OF BIRTH:  Dec 17, 1935  DATE OF CONSULTATION:  11/24/2012  CONSULTING PHYSICIAN:  Denice Bors. Jacqlyn Larsen, MD  HISTORY:  The patient is a 79 year old white gentleman with a history of adenocarcinoma of the prostate. He underwent brachytherapy a number of years prior by Dr. Lucia Bitter in [place].  His PSA had risen. He was placed on Eligard every 6 months. His most recent PSA was around 0.2, indicating overall good prostate cancer control. He was recently admitted with shortness of breath, coughing and wheezing. He has a long history of significant COPD. He was found to be in atrial fibrillation. He developed subsequent urinary retention. He is doing better from a pulmonary standpoint. The catheter has been removed. He has been able to void some spontaneously with high bladder residuals. A recent bladder scan demonstrated approximately 800 mL. His catheterized volume, however, was 1300 mL. He had been placed on Flomax. That seemed to provide some benefit. Discussion earlier today with the primary physician was undertaken. It was suggested the catheter be replaced for a minimum of 7 to 10 days to allow bladder decompression. When the Foley catheter was placed, 1400 mL was drained from the bladder. This is consistent with bladder over-distention.  Return of bladder function will be delayed in this situation. Discussion has been undertaken from this standpoint with the patient. He was doing reasonably well prior to presentation with urination. He did have some frequency and urgency. He related on average 2 times nocturia. He had no significant dysuria or hematuria. The potential causes of urinary retention were discussed in detail. The need for further evaluation was also discussed. He understands the situation.   PAST MEDICAL HISTORY: Significant for: 1. Adenocarcinoma of the prostate.  2. COPD. 3. Hypertension.  4. Coronary artery disease.  5. Hyperlipidemia.   6. Congestive heart failure.  7. Diastolic dysfunction.  8. Sleep apnea with oxygen use.  9. Peripheral vascular disease.   PAST SURGICAL HISTORY: Significant for: 1. Prostate brachytherapy.  2. Carotid endarterectomy.  3. Cardiac stent. 4. Coronary artery bypass graft. 5. Lasix eye surgery.   SOCIAL HISTORY: Significant for a past history of tobacco use. He quit approximately 2 years prior. He denies any alcohol or drug use.   ALLERGIES: No known drug allergies.   MEDICATIONS ON ADMISSION: Nitrostat 0.4 mg sublingual as needed, omeprazole 25 mg once daily, Os-Cal 2 tabs daily, prednisone 10 mg as a taper, Robitussin 5 mL every 4 hours as needed, Spiriva once daily, Symbicort 2 puffs twice daily, Ventolin 2 puffs 4 times a day as needed, vitamin C 1000 mg daily, doxycycline 100 mg 2 times daily started 3 days prior to admission.   PHYSICAL EXAMINATION: VITAL SIGNS: Vital signs stable.  HEENT: Within normal limits.  CHEST: Decreased breath sounds bilaterally with slight wheeze.  CARDIOVASCULAR: Irregular rate and rhythm.  ABDOMEN: Soft, nontender, nondistended. No palpable masses.  EXTREMITIES: Free range of motion x 4.  NEUROLOGIC: Motor and sensory are grossly intact.  GENITOURINARY: Foley catheter in place draining clear yellow urine.   ASSESSMENT: 1. Urinary retention.  2. Prostate cancer.   RECOMMENDATIONS: The patient will need bladder decompression with a Foley catheter for at least 10 days. He should remain on Flomax 0.4 mg. He will likely need to remain on this medication indefinitely. He is established with Dr. Lucia Bitter as his primary urologist. He wishes to follow up as an outpatient with his primary urologist. He should contact Dr. Lucia Bitter upon  discharge to schedule a followup in 7 to 10 days for catheter removal and voiding trial. He will likely need eventual cystoscopy for further evaluation of his bladder. There is a slight chance that he may need further surgical  intervention if he is unable to void with the addition of Flomax. The use of the Eligard for his prostate cancer indicates good prostate cancer control. It is, therefore, much less likely that this is a factor.    If there are any further questions during hospitalization, please feel free to contact us.   ____________________________ Denice Bors. Jacqlyn Larsen, MD bsc:cb D: 11/24/2012 22:43:59 ET T: 11/24/2012 23:31:21 ET JOB#: [id number]  cc: Denice Bors. Jacqlyn Larsen, MD, <Dictator> Denice Bors Ezabella Teska MD ELECTRONICALLY SIGNED 11/25/2012 8:27

## 2014-12-09 NOTE — Discharge Summary (Signed)
PATIENT NAME:  Javier [name], Javier [name] MR#:  [id number] DATE OF BIRTH:  09/23/1935  DATE OF ADMISSION:  06/26/2013 DATE OF DISCHARGE:  06/29/2013  ADMITTING PHYSICIAN:  Dr. Loletha Grayer    DISCHARGING PHYSICIAN: Gladstone Lighter, M.D.   PRIMARY CARE PHYSICIAN: Dr. Casimer Lanius   PRIMARY CARDIOLOGIST: Dr. Ida Rogue.   CONSULTATIONS IN THE HOSPITAL: Cardiology consultation by Dr. Rockey Situ and Dr. Fletcher Anon.   DISCHARGE DIAGNOSES: 1.  Musculoskeletal atypical chest pain.   2.  Coronary artery disease status post bypass graft surgery and percutaneous coronary intervention.  3.  Chronic diastolic congestive heart failure.  4.  History of cerebrovascular accident and transient ischemic attacks.  5.  Anxiety.  6.  Gastroesophageal reflux disease.  7.  Chronic obstructive pulmonary disease.  8.  Chronic atrial fibrillation on Coumadin.   DISCHARGE MEDICATIONS:  1.  Symbicort 160/4.5 two puffs b.i.d.   2.  Ferrous sulfate 325 mg p.o. b.i.d.. 3.  Eligard 45 mg for 6 months subcutaneous injection - 45 mg subcutaneously every 6 month in April and October.  4.  Monteleukast 10mg   p.o. at bedtime.  5.  Ventolin inhaler 2 puffs q.4 h. p.r.n. for shortness of breath.  6.  Roxanol 20 mg per mL concentrate - 0.25 mL orally q. 4 p.r.n. for pain or dyspnea. 7.  Sublingual nitroglycerin 0.4 mg p.r.n. for chest pain.  8.  Prilosec 20 mg p.o. daily  9.  Florinef 0.1 mg p.o. daily.  10.  Senna 8.6 mg p.o. daily in the morning.  11.  Zoloft 50 mg p.o. in the morning.  12.  Crestor 20 mg p.o. in the evening.  13.  Fish oil 1 gm capsule p.o. b.i.d.  14.  Colace 100 mg p.o. b.i.d.  15.  Spiriva 18 mcg inhalation daily.  16.  Lasix 20 mg p.o. in the morning as needed for swelling.  17.  Mucinex 600 mg p.o. b.i.d. p.r.n. for congestion.  18.  DuoNebs 3 mL 4 times a day as needed for shortness of breath.  19.  Robitussin-DM 10 mL q.4h. p.r.n. for cough.  20.  Nystatin  5 mL p.o. b.i.d.  p.r.n. for thrush.   21.  Aspirin 81 mg p.o. daily.  22.  Klor-Con 10 mEq p.o. daily p.r.n. while on while on Lasix.  23.  Vitamin C 1000 mg p.o. daily.  24.  Warfarin 3 mg tablet on Monday, Wednesday, Friday and Sunday.  25.  Warfarin 1.5 mg on Tuesday, Thursday and Saturday.  26.  Xanax 0.25 mg p.o. q.6 hours p.r.n. for anxiety.  27.  Acetaminophen/tramadol 325 mg/37.5 mg oral tablet q. 8 hours p.r.n. for musculoskeletal pain.  28.  Lovenox 100 mg subcutaneously twice a day for 5 days.  29.  Metoprolol 50 mg p.o. b.i.d.  DISCHARGE DIET: Low-sodium.   DISCHARGE ACTIVITY: As tolerated.    FOLLOWUP INSTRUCTIONS: 1.  PT and INR check in 3 days.  2.  Cardiology follow-up in 1 week.  3.  Resume home health nursing.  4.  Continue Lovenox along with Coumadin until INR reaches goal of between 2 to 3 and then stop Lovenox.   LABS AND IMAGING STUDIES PRIOR TO DISCHARGE:  INR prior to discharge is 1.1. WBC 11.1, hemoglobin 11.3, hematocrit 33.8, platelet count 196.   Sodium 139, potassium 3.7, chloride 105, bicarbonate 31, BUN 23, creatinine 1.2, glucose 98 and calcium of 8.7. Cardiac catheterization revealing significant three-vessel coronary artery disease with patent left main stent, patent LIMA to LAD and SVG  to obtuse marginal.  Saphenous venous graft to diagonal is known to be occluded. The abnormal stress test is likely due to ischemia in obtuse marginal 1 distribution, which was noted by past. Myocardial stress test revealing no significant wall motion abnormality noted.  Moderate ischemia in the lateral and inferolateral territory. Ejection fraction is calculated to be 51%. No EKG changes concerning for ischemia. Cardiac enzymes remain negative on admission.   BRIEF HOSPITAL COURSE: Mr. Dowe is a 79 year old Caucasian male with multiple medical problems and multiple hospitalizations secondary to history of CAD with chronic angina, status post PCI and bypass graft surgery, history of COPD, multiple TIAs and  prior history of CVA and chronic atrial fibrillation, on Coumadin who presents to the hospital secondary to chest pain.  1.  Chest pain. This time the chest and was found to be more musculoskeletal in nature with tenderness to palpation.  He was already on optimal cardiac medications; however, had a stress test done because of his high risk factors and it was found to be abnormal. The abnormality of the stress test with moderate ischemic changes in the inferolateral walls was secondary to a chronic occlusion. However, because of his high risk factors, he underwent a cardiac catheterization which revealed patent stent to his left main and patent grafts to his LAD.  However, one of the grafts to obtuse margin was occluded, which was stable from before. However, this was the region that was causing the abnormality on the stress test. The patient has remained chest pain-free while in the hospital. His musculoskeletal pain was mostly treated with tramadol. Because of his cardiac catheterization, his Coumadin was held and had to be restarted back on Coumadin. Because of his high risk with the multiple TIAs and CVA with atrial fibrillation history, he has been bridged with Lovenox at this time. Metoprolol was added to his medication regimen prior to discharge. The patient already on aspirin. He is on Imdur and metoprolol added.  At this time the patient is also on Crestor.  2.  Chronic diastolic congestive heart failure, EF is greater than 50%. The patient takes Lasix as needed because his renal function is very sensitive to Lasix, so based on weight gain and pedal edema, he takes p.r.n. Lasix and potassium supplements.  3.  Chronic atrial fibrillation. Rate was well controlled. The patient was started back on metoprolol. He was on Coumadin and INR was therapeutic when he came but Coumadin was held due to his cardiac catheterization and had to be restarted back, but because of his high risk with multiple TIAs and CVA  requiring TPA in the past, he was restarted back on Coumadin with the bridging from Lovenox at home. The INR is to be rechecked in 3 days and will follow up with Dr. Rockey Situ for any adjustments of his Coumadin dose.  4.  Chronic obstructive pulmonary disease has remained stable when in the hospital. All his inhalers and nebulizers are being continued at this time.  5.  His course has been otherwise uneventful in the hospital.   DISCHARGE CONDITION: Stable.   DISCHARGE DISPOSITION: Home with home health.   TIME SPENT ON DISCHARGE: 40 minutes.     ____________________________ Gladstone Lighter, MD rk:dp D: 06/29/2013 13:25:09 ET T: 06/29/2013 15:09:44 ET JOB#: [id number]  cc: Gladstone Lighter, MD, <Dictator> Minna Merritts, MD Dr. Casimer Lanius Gladstone Lighter MD ELECTRONICALLY SIGNED 07/13/2013 18:08

## 2014-12-09 NOTE — Consult Note (Signed)
HEMATOLOGY followup -  patient is sitting in bed, continues to have dyspnea.  Eating fair.  isolated low-grade temperature of 100.7 today, repeat temperature currently is better at 98.  Denies feeling fever or chills. Denies obvious bleeding issues. weak, otherwise A, O x 3, NAD          vitals-   98, 103, 24, 96/58, 94% on 3 L           lungs - b/l decreased breath sounds, bibasilar crackles, no rhonchi          abd - soft, NT          extremities - edema + hemoglobin 10.3, WBC 16,500, ANC 13,900, platelets 111K, creatinine 1.6. On 4/19 - PT/PTT/FDP unremarkable, fibrinogen elevated at 731, LDH 248, serum iron low 21, iron saturation low at 8%, TIBC 271, ferritin 18.  79 year old gentleman with history of multiple medical problems as described above, currently admitted with progressive weakness, dyspnea and felt to have recurrent heart failure. He has had persistent and fluctuating leukocytosis at least since 11/21/2010 (when WBC count was 20,900 with ANC of 18,200) which is most likely reactive etiology given repeated episodes of sickness and hospitalization, and recent prednisone usage  Serum LDH is just above normal range, LAP score is pending.  No new recommendations at this time, continue to monitor CBC. has new onset thrombocytopenia, could be medication-induced since he has recently been on antibiotics.  DIC panel is unremarkable, serum LDH just about normal range and haptoglobin is pending (the patient otherwise does not have persistent fever, altered mental status or progressive renal failure).  Platelet count today has improved to 111K. Continue to monitor CBC and watch for bleeding symptoms. anemia, he continues to have evidence of fine deficiency despite taking oral iron for the past 2 months also.  B12, folate, EPO level is pending.  Stool Hemoccult is pending. SIEP unremarkable recently. Will request his primary hematologist Dr.Gittin to followup tomorrow and see if patient would benefit from  parenteral iron therapy, and also to follow up for other above-mentioned hematological issues.     Electronic Signatures: Jonn Shingles (MD)  (Signed on 20-Apr-14 21:52)  Authored  Last Updated: 20-Apr-14 21:52 by Jonn Shingles (MD)

## 2014-12-09 NOTE — Discharge Summary (Signed)
PATIENT NAME:  Javier [name], Javier [name] MR#:  [id number] DATE OF BIRTH:  February 12, 1936  DATE OF ADMISSION:  12/24/2012 DATE OF DISCHARGE:  12/25/2012  ADMISSION DIAGNOSIS: Hypotension.   DISCHARGE DIAGNOSES: 1.  Hypotension, likely medication induced, resolved.  2.  Acute on chronic respiratory failure secondary to acute on chronic diastolic heart failure and acute chronic obstructive pulmonary disease exacerbation.  3.  Endstage chronic obstructive pulmonary disease.  4.  History of atrial flutter.  5.  Chronic iron deficiency anemia.  6.  Acute on chronic kidney disease.  7.  Depression.  8.  Leukocytosis.   CONSULTS: Dr. Rockey Situ.   DISCHARGE LABORATORY DATA: Sodium 136, potassium 4.5, chloride 101, bicarbonate 28, BUN 18, creatinine 0.95, and glucose 157. White blood cells 9.1, hemoglobin 9.4, hematocrit 27.6, and platelets 131.   Troponin is negative. Blood cultures are negative to date.   HOSPITAL COURSE:  This is a 79 year old male sent from cardiology office with hypotension. For further details, please refer to Dr. Edward Jolly H and P. 1.  Hypotension, likely medication induced. The patient's blood pressure issues actually resolved. He is hypertensive. He was put back on his diltiazem. 2.  Acute on chronic respiratory failure, multifactorial, from COPD exacerbation and acute on chronic diastolic heart failure. The patient was started on some steroids and antibiotics for COPD exacerbation. His chest x-ray on admission showed some pulmonary edema. He is very tenuous with his fluid balance. Therefore, Dr. Rockey Situ was consulted. Dr. Rockey Situ did give him 20 mg IV of Lasix and he responded quite well to this, and he has diuresed well with his Lasix.  3.  Endstage COPD with repeated flare ups.  As above, the patient was treated for COPD exacerbation.  4.  History of atrial flutter status post cardioversion. The patient is now on amiodarone and will be on a tapering dose of his amiodarone as well as  diltiazem. Xarelto has been stopped due to bleeding in the past.  5.  Chronic iron deficiency anemia. His hemoglobin remained stable.  6.  Acute on chronic kidney disease from hypotension and probably pulmonary edema. We did give him Lasix for his pulmonary edema, CHF exacerbation, and his creatinine actually improved.  7.  Depression.  We continued the patient's Zoloft.  8.  Leukocytosis. No evidence of pneumonia on chest x-ray.  The patient did have some low-grade fevers. He might have had acute bronchitis. He was started on antibiotics for COPD exacerbation.  His white blood cell count has normalized.   DISCHARGE MEDICATIONS:  1.  Symbicort 2 puffs b.i.d.  2.  Ferrous sulfate 325 mg b.i.d.  3.  Eligard 45 mg every 6 months, on the 1st of April and October.  4.  Docusate 100 mg 1 tablet b.i.d.  5.  Spiriva 18 mcg daily.  6.  Vitamin C 500 mg 2 tablets daily.  7.  Os-Cal 1 tablet b.i.d.  8.  Crestor 20 mg daily. 9.  Fish oil 500 mg 2 tablets daily.  10.  Flomax 0.4 mg daily.  11.  Losartan 25 mg daily.  12.  Monteleukast10 mg at bedtime.  13.  Omeprazole 40 mg b.i.d.  14.  Senna 1 tablet daily. 15.  Zoloft 50 mg daily.  16.  Multivitamin 1 tablet daily.  17.  Levalbuterol 3 mL q. 4 hours p.r.n.  18.  Xanax 0.25 mg q. 6 hours p.r.n. anxiety.  19.  Lasix 20 mg 1 tablet a day as needed for weight gain of greater than 3 pounds  in 24 hours and 4 pounds in 7 days of if edema worsens or severe shortness of breath. 20.  Ventolin HFA 2 puffs q. 4 hours p.r.n. shortness of breath.  21.  Morphine 0.25 mL q. 4 hours p.r.n. pain.  22.  Acetaminophen/hydrocodone 325/5 q. 4 hours p.r.n. pain.  23.  Levaquin 750 mg daily for 4 days.  24.  Amiodarone 400 mg b.i.d. for 4 days then 200 mg b.i.d.  25.  Albuterol ipratropium inhalation q. 4 hours for 4 days.  26.  Diltiazem 120 mg daily.   DISCHARGE OXYGEN: 2 liters nasal cannula at night.   DISCHARGE DIET: Low sodium.   DISCHARGE ACTIVITY: As  tolerated.  DISCHARGE FOLLOWUP:  The patient will follow up with Dr. Rockey Situ next week.  TIME SPENT:  Approximately 35 minutes. ____________________________ Donell Beers. Benjie Karvonen, MD spm:sb D: 12/25/2012 10:29:03 ET T: 12/25/2012 11:02:52 ET JOB#: [id number]  cc: Quince Santana P. Benjie Karvonen, MD, <Dictator> Mahaley Schwering P Joanathan Affeldt MD ELECTRONICALLY SIGNED 12/25/2012 13:00

## 2014-12-09 NOTE — Consult Note (Signed)
PATIENT NAME:  Javier [name], Javier [name] MR#:  [id number] DATE OF BIRTH:  10-31-1935  SOAP NOTE - 12/09/12 - Late Entry  The patient states that he is better than yesterday. Ready to start getting up and moving. No chest pain. No leg pain or calf pain.   PHYSICAL EXAMINATION:  VITAL SIGNS:  Temperature 98.9, pulse 84, respirations 20, blood pressure 146/84, sats 97% on room air.  GENERAL:  A pleasant gentleman, quite cooperative sitting in a chair. Obese.  HEENT: Normocephalic, nontraumatic. Perrla, EOMI  NECK:  Supple, short, thick. No stridor.  CHEST: Bilateral breath sounds. Rare wheeze.  CARDIAC:  Regular rhythm. No obvious murmur or gallops.  ABDOMEN:  Soft, nontender, obese.  EXTREMITIES:  Edema present, improving. No calf pain or cords.  SKIN:  Fair turgor. No rashes, no ULCERLYMPH:  No nodes.  NEUROLOGIC:  No gross deficits, unchanged, just globally weak.  PSYCHIATRIC:  Appropriate affect. Alert and oriented.   LABORATORIES:  Creatinine is 1.4. WBC 11.3.   IMPRESSIONS:   1. Bilateral infiltrates, ?? concern about fluid; also inflammatory process, although pneumonia cannot be ruled out. Hence, I recommended that we continue the same course the he has been on; that is, continue his antibiotics, monitor his fluids, oxygen p.r.n. I am going to repeat his chest x-ray in the morning.  2.  Chronic obstructive pulmonary disease/asthma. Recommendation: We are going to continue his same COPD/asthma regimen; steroids, maybe tomorrow we can start weaning off, switching to p.o. prednisone.  3.  Deconditioned.   RECOMMENDATION:  Pulmonary rehab, in the interim, physical therapy, reassess tomorrow.    ____________________________ Freda Munro Raul Del, MD hef:mr D: 12/10/2012 18:39:00 ET T: 12/10/2012 19:02:54 ET JOB#: [id number]  cc: Herbon E. Raul Del, MD, <Dictator> Erby Pian MD ELECTRONICALLY SIGNED 01/05/2013 13:20

## 2014-12-09 NOTE — Consult Note (Signed)
General Aspect 79 year old male with h/o smoking 40 years, frequent admissions, h/o severe COPD exacerbations/bronchitis, chronic respiratory failure, CAD s/p CABG in 2007, occlusion of 2 VGs with PCI x4, persistent AF/flutter, HFpEF, PVD with bilateral carotid endarterectomies in 2002, h/o CVA/TIAs, h/o prostate CA s/p brachytherapy, h/o GIB/hematuria and anemia who presents back to [facility] with chest pain.   He has been admitted multiple times for for chest pain, A/C diastolic CHF, A/COPD and syncope- orthostatic/hypovolemia in 7-03/2013. He was seen again at [facility] 04/19/13 for left-sided hemiparesis, slurred speech and weakness. tPa was administered and he was transferred to [facility]. MRI revealed no evidence of infarct and he had near full neurological recovery. Cardioembolic source was suspected. Anticoagulation had been held previously d/t hematuria. He was started on full-dose ASA and discharged. He followed up with Dr. Rockey Situ. Coumadin started after a long discussion, ASA held, goal INR 1.8-2.2 to minimize bleeding risk. Repeat TIAs x 2 last month. No evidence of CVA on imaging. Manifested as L arm/ & L leg weakness.   Hospitalization in 04/2013 for atypical chest pain deemed to be musculoskeletal.   Echocardiogram 11/18/12:  EF 55-60%, rhythm atrial flutter, mild LA dilatation, mildly dilated LA, mild TR/MR, mildly elevated PASP, diastolic dysfxn, rhythm noted to be atrial flutter. On prior echos, severe asymmetric LVH with moderate LVOT gradient, mitral SAM identified.  He reports experiencing sharp, L sided chest pain worse on inspiration, palpation and cough at 2 PM and continuing constantly today. He took NTG SL with some relief. It did radiate down his L arm. He has had 2-3 episodes of malaise, cough and congestion over the past 1-2 months. No fevers, chills or sputum productive. No relation to exertion. No SOB/DOE, PND, orthopnea, LE edema, palpitations or syncope.   Present Illness In the ED,  EKG revealed rate-controlled atrial fibrillation. Initial TnI WNL x 3. Cardiology has been consulted for chest pain. CMP- BUN 15/ Cr 1.13, alb 3.3. INR 2.7. CBC- WBC 13.3K, Hgb 11.5/Hct 33.6. BNP 2976. CXR showed mild vascular congestion with early edema on the left. He was admitted by medicine for overnight rule out and underwent Lexiscan Myoview this AM.   Prior Veva Holes done in the hospital 08/2011 indicated mild ischemia, basal-mid lateral wall, old infarct mild peri-infarct ischemia, EF 72%.   PAST SURGICAL HISTORY: CABG, carotid endarterectomy, cardiac stent, Lasik surgery.    SOCIAL HISTORY: He quit smoking 2 years ago. Denies any alcohol drinking or illicit drug use.   FAMILY HISTORY: Significant for diabetes and coronary artery disease. Father with esophageal CA.   Physical Exam:  GEN no acute distress, obese   HEENT pink conjunctivae, PERRL, hearing intact to voice   NECK supple  No masses  trachea midline  no appreciable bruits   RESP normal resp effort  no use of accessory muscles  diffuse centralized rhonchi, most prominent in the RLL field with associated wheezing   CARD Irregular rate and rhythm  Normal, S1, S2  No murmur  chest wall tenderness to palpation   ABD denies tenderness  soft  normal BS   EXTR negative cyanosis/clubbing, negative edema   SKIN normal to palpation, skin turgor good   NEURO follows commands, motor/sensory function intact   PSYCH alert, A+O to time, place, person   Review of Systems:  Subjective/Chief Complaint chest pain   General: malaise   Respiratory: Frequent cough   Cardiovascular: Chest pain or discomfort   Review of Systems: All other systems were reviewed and found  to be negative   Home Medications: Medication Instructions Status  Symbicort 160 mcg-4.5 mcg/inh inhalation aerosol 2 puff(s) inhaled 2 times a day Active  Eligard 45 mg/6 months subcutaneous injection, extended release 45 milligram(s) subcutaneous  every 6 months on the first of April and October Active  ferrous sulfate 325 mg oral tablet 1 tab(s) orally 2 times a day Active  codeine-guaiFENesin 10 mg-100 mg/5 mL oral syrup 10 milliliter(s) orally every 4 hours, As Needed for pain from coughing Active  Nitrostat 0.4 mg sublingual tablet 1 tab(s) sublingual every 5 minutes, As Needed - for Chest Pain Active  PriLOSEC 20 mg oral delayed release tablet 1 tab(s) orally once a day (in the morning) Active  fludrocortisone 0.1 mg oral tablet 1 tab(s) orally once a day (in the morning) Active  Senna 8.6 mg oral tablet 1 tab(s) orally once a day (in the morning) Active  Zoloft 50 mg oral tablet 1 tab(s) orally once a day (in the morning) Active  Crestor 20 mg oral tablet 1 tab(s) orally once a day (in the evening) Active  warfarin 3 mg oral tablet 1 tab(s) orally once a day (in the evening) on Monday, Wednesday, Friday, and Sunday Active  Fish Oil 1000 mg oral capsule 1 cap(s) orally 2 times a day Active  docusate sodium 100 mg oral tablet 1 tab(s) orally 2 times a day Active  Spiriva 18 mcg inhalation capsule 1 each inhaled once a day (in the morning) Active  furosemide 20 mg oral tablet 1 tab(s) orally once a day (in the morning), As Needed for swelling Active  Mucinex 600 mg oral tablet, extended release 1 tab(s) orally 2 times a day, As Needed for congestion Active  albuterol-ipratropium 2.5 mg-0.5 mg/3 mL inhalation solution 3 milliliter(s) inhaled 4 times a day, As Needed - for Shortness of Breath Active  Robitussin DM To Go 20 mg-200 mg/10 mL oral liquid 10 milliliter(s) orally every 4 hours, As Needed for cough Active  acetaminophen-traMADol 325 mg-37.5 mg oral tablet 1 tab(s) orally 1 to 2 times a day, As Needed for joint pain Active  nystatin 100000 units/mL oral suspension 5 milliliter(s) orally 2 times a day Active  montelukast 10 mg oral tablet 1 tab(s) orally once a day (at bedtime)  Active  ALPRAZolam 0.25 mg oral tablet 1 tab(s)  orally every 6 hours, As Needed for anxiety Active  Ventolin HFA 90 mcg/inh inhalation aerosol 2 puff(s) inhaled every 4 hours, As Needed - for Shortness of Breath Active  morphine 20 mg/mL oral concentrate 0.25 milliliter(s) orally every 4 hours, As Needed - for Pain Active  acetaminophen-HYDROcodone 325 mg-5 mg oral tablet 1 tab(s) orally every 4 hours, As Needed - for severe Pain from coughing Active  aspirin 81 mg oral delayed release tablet 1 tab(s) orally once a day (in the morning) Active  Klor-Con 10 mEq oral tablet, extended release 1 tab(s) orally once a day, As Needed when taking Lasix for swelling Active  Vitamin C 1000 mg oral tablet 1 tab(s) orally once a day (in the morning) Active  warfarin 3 mg oral tablet 0.5 tab(s) (1.5 mg) orally once a day (in the evening) on Tuesday, Thursday, and Saturday Active   Lab Results:  Routine Chem:  06-Nov-14 15:06   B-Type Natriuretic Peptide [facility])  2976 (Result(s) reported on 24 Jun 2013 at 05:09PM.)   EKG:  Interpretation atrial fibrillation, LAD, no ST/T changes   Rate 107   EKG Comparision Not changed from  10/20  tracing   Radiology Results: XRay:    06-Nov-14 15:20, Chest Portable Single View  Chest Portable Single View   REASON FOR EXAM:    Chest Pain  COMMENTS:       PROCEDURE: DXR - DXR PORTABLE CHEST SINGLE VIEW  - Jun 24 2013  3:20PM     CLINICAL DATA:  Chest pain on the right    EXAM:  PORTABLE CHEST - 1 VIEW    COMPARISON:  05/23/2013    FINDINGS:  Postsurgicalchanges are again noted. The cardiac shadow is stable.  Mild increased vascular congestion is noted with some changes of  early edema on the left. No sizable effusion is seen. No  pneumothorax is noted.     IMPRESSION:  Changes of early CHF.      Electronically Signed    By: Inez Catalina M.D.    On: 06/24/2013 15:37         Verified By: Everlene Farrier, M.D.,    Prednisone: Unknown  Vital Signs/Nurse's Notes: **Vital Signs.:   07-Nov-14  05:20  Vital Signs Type Q 4hr  Temperature Temperature (F) 97.6  Celsius 36.4  Pulse Pulse 95  Respirations Respirations 20  Systolic BP Systolic BP 712  Diastolic BP (mmHg) Diastolic BP (mmHg) 95  Mean BP 117  Pulse Ox % Pulse Ox % 95  Pulse Ox Activity Level  At rest  Oxygen Delivery 2L; Nasal Cannula  *Intake and Output.:   07-Nov-14 08:00  Grand Totals Intake:  0 Output:  0    Net:  0 33 Hr.:  0  Dietary Restrictions  NPO    Impression 79 year old male with h/o smoking 40 years, frequent admissions for severe COPD exacerbations/bronchitis, chronic respiratory failure, CAD s/p CABG in 2007, occlusion of 2 VGs with PCI x4, persistent AF/flutter, HFpEF, PVD with bilateral carotid endarterectomies in 2002, history of TIA in 2002, h/o prostate CA s/p brachytherapy, h/o GIB/hematuria and anemia who presents back to [facility] with chest pain.   1. Chest wall pain The patient described chest discomfort aggravated by inspiration and coughing for several hours. Still not completely resolved. On exam, he endorses discomfort with chest wall palpation. EKG reveals no ischemic changes. TnI x 3 WNL. He has had a viral prodrome 2-3 times over the past 1-2 months. He underwent Myoview this AM.  -- Supportive care for inflammatory etiologies- musculoskeletal or pleuritic or costochondritis -- Lexiscan Myoview shows moderate lateral/inferolateral reversibility, EF 51%. He has a significant CAD history. CAD stable on 2012 cath at [facility]. Prior graft-OM1 PCI in 2010 (same distribution on stress). Discuss utility of intervention with MD.  -- Would avoid NSAIDs given CKD and h/o GIB. Add Ultram.  -- Patient would most definitely benefit from [facility] care management services if approved given multiple admissions and underlying comorbidities  2. H/o syncope/orthostatic hypotension No further episodes of syncope/presyncope.  -- Continue compression stockings  3. Chronic diastolic CHF BNP 4580 on admission. JVP up  a bit on exam. Fine bibasilar rales. EF 55-60% in April of this year. Euvolemic weight ~ 230 lbs. 229-230 lbs this admission.  -- Continue Lasix PO. He is sensitive to hypovolemia/dehydration. Avoid A/CKD as well. -- Salt/fluid restriction while inpatient.   Plan 4. Persistent atrial fibrillation Rate-controlled. Recent cardioembolic CVA. Coumadin restarted. No abnormal bleeding. U/a w/o evidence of hematuria. INR 2.1 today. Goal 1.8-2.2.  -- Will adjust Coumadin to recent regimen- 3mg  daily, except 1.5mg  Mon, Fri.  -- Rate-controlled off AVN blockers. Continue to monitor  on telemetry.  5 .CAD s/p CABG, multiple PCIs EKG w/o ischemic changes. Trop-I WNL x 3. -- Would start full dose ASA given h/o CABG. Will need to monitor bleeding with concomitant Coumadin use.  6. H/o R-sided CVA/TIAs Minimal left-sided deficits. Suspected to be cardioembolic while off anticoagulation for a-fib due to h/o hematuria.  -- Continue Coumadin. Goal INR 1.8-2.2 as above.  7. CKD, stage III Stable. Per primary team.  8. COPD Stable. Per primary team   Electronic Signatures for Addendum Section:  Kathlyn Sacramento (MD) (Signed Addendum [phone] 13:59)  The patient was seen and exmained. Agree with the above. His chest pain is atypical. However, stress test showed lateral and inferolateral ischemia. I had a prolonged discussion with the patient about management options. I favord medical therapy for now given his comorbidities and previous difficult PCI at [facility]. However, the patient is exteremely worried and prefers going though cardiac cath. I discussed risks and benefits. Plan cardiac cath Monday. Hold Warfarin.   Electronic Signatures: Meriel Pica (PA-C)  (Signed 07-Nov-14 13:26)  Authored: General Aspect/Present Illness, History and Physical Exam, Review of System, Home Medications, Labs, EKG , Radiology, Allergies, Vital Signs/Nurse's Notes, Impression/Plan Kathlyn Sacramento (MD)  (Signed [phone]  13:59)  Co-Signer: General Aspect/Present Illness, History and Physical Exam, Review of System, Home Medications, Labs, EKG , Radiology, Allergies, Vital Signs/Nurse's Notes, Impression/Plan   Last Updated: 07-Nov-14 13:59 by Kathlyn Sacramento (MD)

## 2014-12-09 NOTE — Telephone Encounter (Signed)
Do you want him to keep wearing our monitor?

## 2014-12-09 NOTE — H&P (Signed)
PATIENT NAME:  Javier [name], WAFER MR#:  [id number] DATE OF BIRTH:  [birth year]  DATE OF ADMISSION:  08/31/2012  PRIMARY CARE PHYSICIAN: Dr. Mila Palmer  PULMONOLOGIST: Wallene Huh, MD  REFERRING PHYSICIAN: Charlesetta Ivory, MD   CHIEF COMPLAINT: Shortness of breath, cough.   HISTORY OF PRESENT ILLNESS: This is a 79 year old male with significant past medical history of COPD exacerbation on home oxygen at nighttime only, history of recurrent pneumonia x 2 in the past, hypertension, hyperlipidemia, diastolic dysfunction of the heart and hyperlipidemia who presents with complaints of shortness of breath. The patient reports he has seen his PCP over the last week, where he prescribed him p.o. azithromycin course as well as prednisone tapering course. He reports he did finish p.o. azithromycin course on Saturday, and he is still taking his p.o. prednisone tapering dose without significant improvement, actually reports he has been having his cough worsened over the last two days. In the ED, the patient had diffuse wheezing where he received IV Solu-Medrol and nebulizer treatment without much improvement of his wheezing so the hospitalist was requested to admit the patient for COPD exacerbation. The patient denies any fever or chills, any hemoptysis. He reports he has been having a cough. He reports it is nonproductive, but he feels he has congestion but cannot bring the sputum up. The last time the patient was admitted was in November of last year for similar complaints of COPD exacerbation. The patient denies any chest pain, any coffee-ground emesis, any melena, any abdominal pain or any bright red blood per rectum.   PAST MEDICAL HISTORY: 1. History of COPD. 2. History of prostate cancer status post brachytherapy.  3. History of pneumonia x 2.  4. Peripheral vascular disease.  5. Coronary artery disease.  6. Hypertension.  7. Obstructive sleep apnea with oxygen at night, no CPAP.  8. Hyperlipidemia.   9. Diastolic dysfunction, congestive heart failure.   PAST SURGICAL HISTORY: 1. Coronary artery bypass graft.  2. Carotid endarterectomy.  3. Cardiac stent.  4. LASIK on eyes.   ALLERGIES: No known drug allergies.   HOME MEDICATIONS: 1. Prednisone tapering dose. 2. Acetaminophen/hydrocodone as needed.  3. Acetaminophen/tramadol as needed.  4. Aspirin 160 mg oral daily.  5. Clonidine 0.1 mg oral 2 times a day. 6. Isosorbide mononitrate 30 mg oral daily.  7. Nitrostat as needed.  8. Nystatin oral suspension 2 times a day.  9. Crestor 20 mg oral in the evening. 10. Zetia 5 mg in the morning. 11. Eligard subcutaneous injection every 6 months.  12. Alprazolam 0.25 mg every 6 hours as needed for anxiety.  13. Coreg 6.25 mg oral 2 times a day.  14. DuoNebs as needed.  15. Symbicort 2 puffs 2 times a day.  16. Ventolin as needed.  17. Norvasc 5 mg oral daily.  18. Lasix 20 mg as needed.  19. Mucinex 600 mg p.o. 2 times a day. 20. Colace 2 times a day.  21. Montelukast 10 mg oral daily.  22. Fish oil 1000 mg oral 2 times a day.  23. Omeprazole 20 mg oral daily.  24. Nicotrol inhaler device as needed.  25. Multivitamin 1 tablet daily.  26. Os-Cal 1 tablet 2 times a day.  27. Vitamin C 1000 mg oral daily.   SOCIAL HISTORY: The patient lives at home. He quit smoking 2 years ago. Since then has been smoking on and off. Denies any recent smoking. Does not drink. Denies IV drug abuse. Retired.   FAMILY HISTORY: Significant for  diabetes and coronary artery disease in the family.   REVIEW OF SYSTEMS:   CONSTITUTIONAL: Denies fever, chills. Has complaints of weakness and fatigue.   EYES: Denies blurry vision, double vision, pain or inflammation.   ENT: Denies any tinnitus, ear pain, hearing loss, epistaxis or discharge.   RESPIRATORY: Has complaints of cough and wheezing and dyspnea. Denies any hemoptysis.   CARDIOVASCULAR: Denies chest pain, edema, arrhythmia, palpitations or  syncope.   GASTROINTESTINAL: Denies nausea, vomiting, diarrhea, abdominal pain or hematemesis.  GENITOURINARY: Denies dysuria, hematuria or renal colic.   ENDOCRINE: Denies polyuria, polydipsia, heat or cold intolerance.   HEMATOLOGY: Denies anemia, easy bruising or bleeding diathesis.   INTEGUMENTARY: Denies acne, rash or skin lesions.   MUSCULOSKELETAL: Denies any gout, redness, arthritis, cramps or swelling.   NEUROLOGICAL: Denies vertigo, ataxia, dementia, headache, CVA, TIA or seizures.   PSYCHIATRIC: Denies anxiety, insomnia, schizophrenia, nervousness or bipolar disorder.   PERTINENT LABS/RADIOLOGIC STUDIES: Glucose 118, proBNP 455, BUN 24, creatinine 1.29, sodium 141, potassium 3.5, chloride 107 and CO2 24. Troponin less than 0.02. White blood cells 17.2, hemoglobin 11.5, hematocrit 36.8 and platelets 197.   Chest x-ray: There is bilateral diffuse interstitial thickening representing interstitial edema versus interstitial pneumonitis secondary to an infectious or inflammatory etiology.   ASSESSMENT AND PLAN: 1. Chronic obstructive pulmonary disease exacerbation. The patient presents with diffuse wheezing and known history of COPD. We will have him on oxygen as needed. We will start him on IV Solu-Medrol and DuoNebs and p.r.n. albuterol. We will continue him on his home dose of Symbicort and Spiriva. Given the fact he is having cough with congestion and unable to produce sputum, we will have him on Mucinex and we will start him on IV Levaquin.  2. History of acute diastolic congestive heart failure. The patient had some mild congestion on his chest x-ray. We will have him on p.o. Lasix p.o. 2 times a day as clinically he does not appear to be in volume overload so we will diurese gently and may need to repeat chest x-ray in 48 hours to follow on the volume status.  3. Coronary artery disease. The patient has no chest pain, no EKG changes. We will continue him on aspirin, statin, Zetia  and beta blockers.  4. Hypertension. Blood pressure controlled. Continue on home medications of clonidine, Norvasc, beta blockers and Imdur.  5. Obstructive sleep apnea. We will continue the patient on oxygen at bedtime.  6. Hyperlipidemia. We will continue on Zetia and statin.  7. DVT prophylaxis. Subcutaneous heparin.  8. GI prophylaxis. On PPI.   CODE STATUS: FULL CODE.   TOTAL TIME SPENT ON ADMISSION AND PATIENT CARE: 55 minutes.  ____________________________ Albertine Patricia, MD dse:sb D: 08/31/2012 08:15:07 ET T: 08/31/2012 09:46:12 ET JOB#: [id number]  cc: Albertine Patricia, MD, <Dictator> Erek Kowal Graciela Husbands MD ELECTRONICALLY SIGNED 09/04/2012 7:36

## 2014-12-09 NOTE — Consult Note (Signed)
General Aspect 79 year old male with h/o smoking 40 years,severe  COPD, frequent admissions for COPD exacerbations/bronchitis, coronary artery disease with bypass surgery in 2007, occlusion of 2 vein grafts with PCI x4, PVD with bilateral carotid endarterectomies in 2002, history of TIA in 2002, h/o prostate CA s/p brachytherapy, TIA,  admitted  for hematuria, malaise, anemia.  Recent EGD and colonoscopy for guaiac positive stools, by Dr. Vira Agar with bx and caudery by report.  Recent admission for hamaturia that stopped on its own. xarelto restarted and discharged.  Still recovering from COPD flare and bronchitis. On ABX and prednisone.     He has frequent COPD exacerbations, often requiring hospitalization often needing long courses of antibiotics and steroids.  He has received significant IV lasix BID on this admission for the concern of diastolic CHF. Converted to atrial flutter last night. Some chest tightness this AM in atrial flutter.  This AM, BP low. He has had 7.5 liters out, now with worsening renal failure (prerenal).  Hospitalization in May 2013 chest pain, COPD exacerbation. Stress test showed no ischemia. Echocardiogram was essentially normal with mild MR.   Present Illness cardiac catheterization several years ago showing patent vessels w/o significant progression of his disease.   He was previously found to be resistant to Plavix by P2Y12 assay at [facility].  discharged several weeks ago for new onset atrial flutter s/p DCCV. Placed on amiodarone, diltiazem and Xarelto. He felt well initially, but began having gross hematuria, increased DOE/SOB, LE edema and returned the [facility] ED. There, he filled two urinals full of gross hematuria which gradually resolved. Leukocytosis was evident on CBC and ED provider noted a tender prostate. He was started on Rocephin for prostatitis. d/ced home after hematuria stopped, back on anticoagulation given recent cardioversion.   Prior Carotid u/s done  in the hospital 01/12/2012 shows no significant carotid disease.   PAST SURGICAL HISTORY: CABG, carotid endarterectomy, cardiac stent, Lasik surgery on the eye.    SOCIAL HISTORY: He quit smoking 2 years ago. Denies any alcohol drinking or illicit drug use.   FAMILY HISTORY: Significant for diabetes and coronary artery disease.   Physical Exam:  GEN well developed, well nourished, critically ill appearing   HEENT PERRL, hearing intact to voice, moist oral mucosa, appears pale   NECK supple   RESP postive use of accessory muscles  rhonchi  crackles   CARD Irregular rate and rhythm  Murmur   Murmur Systolic   Systolic Murmur Out flow   ABD denies tenderness  soft   LYMPH negative axillae   EXTR negative edema   SKIN normal to palpation   NEURO motor/sensory function intact   PSYCH alert, A+O to time, place, person, good insight   Review of Systems:  Subjective/Chief Complaint blood in urine, shortness of breath, Malaise, weak "don't feel good" thick cough   General: Fatigue  Weakness   Skin: No Complaints   ENT: No Complaints   Eyes: No Complaints   Neck: No Complaints   Respiratory: Frequent cough  Short of breath  Sputum   Cardiovascular: Dyspnea   Gastrointestinal: No Complaints   Genitourinary: No Complaints   Vascular: No Complaints   Musculoskeletal: No Complaints   Neurologic: No Complaints   Hematologic: No Complaints   Endocrine: No Complaints   Psychiatric: No Complaints   Review of Systems: All other systems were reviewed and found to be negative   Medications/Allergies Reviewed Medications/Allergies reviewed     Anemia, Chronic:    CVA/Stroke:  MI - Myocardial Infarct:    prostate cancer with brachytherapy:    prostate cancer with bradytherapy:    peripheral vascular disease:    HTN:    CAD:    COPD:    Bronchitis:    High cholesterol:    angina:    triple bypass:    lasik surgery both eyes:    4  cardiac stents:    Carotid Endarterectomy:   Home Medications: Medication Instructions Status  Lasix 20 mg oral tablet 1 tab(s) orally every other day Active  Levaquin 250 mg oral tablet 1 tab(s) orally every 24 hours Active  amoxicillin-clavulanate 875 mg-125 mg oral tablet 1 tab(s) orally every 12 hours Active  acetaminophen-hydrocodone 325 mg-5 mg oral tablet 1 tab(s) orally every 4 hours, As Needed for severe pain from coughing Active  amiodarone 200 mg oral tablet 1 tab(s) orally once a day Active  diltiazem 240 mg/24 hours oral capsule, extended release 1 cap(s) orally once a day Active  oxybutynin 5 mg oral tablet 1 tab(s) orally every 6 hours, As Needed, bladder spasms, perineal pain , As needed, bladder spasms, perineal pain Active  losartan 50 mg oral tablet 1 tab(s) orally once a day Active  tamsulosin 0.4 mg oral capsule 1 cap(s) orally once a day (after a meal) Active  rivaroxaban 20 mg oral tablet 1 tab(s) orally once a day (at bedtime) Active  carvedilol 6.25 mg oral tablet 1 tab(s) orally 2 times a day Active  Symbicort 160 mcg-4.5 mcg/inh inhalation aerosol 2 puff(s) inhaled 2 times a day Active  alprazolam 0.25 mg oral tablet 1 tab(s) orally every 6 hours as needed for anxiety Active  Centrum Silver oral tablet 1 tab(s) orally once a day (in the morning) Active  Vitamin C 1000 mg oral tablet 1 tab(s) orally once a day (in the morning) Active  Fish Oil 1000 mg oral capsule 1 cap(s) orally 2 times a day (morning and evening). Active  Crestor 20 mg oral tablet 1 tab(s) orally once a day (in the evening) Active  Ventolin HFA 90 mcg/inh inhalation aerosol 2 puff(s) inhaled 4 times a day as needed for shortness of breath Active  albuterol-ipratropium 2.5 mg-0.5 mg/3 mL inhalation solution 3 milliliter(s) inhaled 4 times a day, As Needed- for Shortness of Breath , for Wheezing  Active  isosorbide mononitrate 30 mg oral tablet, extended release 1 tab(s) orally once a day (in the  evening) Active  omeprazole 20 mg oral delayed release tablet 1 tab(s) orally once a day (in the morning) Active  montelukast 10 mg oral tablet 1 tab(s) orally once a day (in the evening) Active  docusate sodium 100 mg oral tablet 1 tab(s) orally 2 times a day (morning and evening) Active  Os-Cal 500 + D 500 mg-400 intl units oral tablet, chewable 1 tab(s) orally 2 times a day (morning and evening) Active  Eligard 45 mg/6 months subcutaneous injection, extended release 45 milligram(s) subcutaneous every 6 months Active  ferrous sulfate 325 mg oral tablet 1 tab(s) orally 2 times a day Active  Nitrostat 0.4 mg sublingual tablet 1 tab(s) sublingual every 5 minutes, As Needed as needed for chest pain if no relief call md or go to emergency room **maximum 3 tablets** Active  Spiriva 18 mcg inhalation capsule 1 cap(s) via handihaler once a day. Active   Lab Results:  Routine Chem:  20-Apr-14 07:00   Glucose, Serum  112  BUN  29  Creatinine (comp)  1.60  Sodium, Serum  130  Potassium, Serum 3.6  Chloride, Serum  92  CO2, Serum 31  Calcium (Total), Serum 8.6  Anion Gap 7  Osmolality (calc) 267  eGFR (African American)  48  eGFR (Non-African American)  42 (eGFR values <62m/min/1.73 m2 may be an indication of chronic kidney disease (CKD). Calculated eGFR is useful in patients with stable renal function. The eGFR calculation will not be reliable in acutely ill patients when serum creatinine is changing rapidly. It is not useful in  patients on dialysis. The eGFR calculation may not be applicable to patients at the low and high extremes of body sizes, pregnant women, and vegetarians.)  Routine Hem:  20-Apr-14 07:00   WBC (CBC)  16.5  RBC (CBC)  3.64  Hemoglobin (CBC)  10.3  Hematocrit (CBC)  31.5  Platelet Count (CBC)  111  MCV 87  MCH 28.4  MCHC 32.7  RDW  16.4  Neutrophil % 84.0  Lymphocyte % 9.6  Monocyte % 5.3  Eosinophil % 0.4  Basophil % 0.7  Neutrophil #  13.9  Lymphocyte  # 1.6  Monocyte # 0.9  Eosinophil # 0.1  Basophil # 0.1 (Result(s) reported on 06 Dec 2012 at 07:27AM.)   EKG:  Interpretation EKG on arrival shows NSR Telemetry showing at atrial fibrillation, rates in the 90s   Radiology Results: XRay:    20-Apr-14 09:53, Chest Portable Single View  Chest Portable Single View   REASON FOR EXAM:    respiratory failure  COMMENTS:       PROCEDURE: DXR - DXR PORTABLE CHEST SINGLE VIEW  - Dec 06 2012  9:53AM     RESULT: Comparison is made to the study dated 04 December 2012 and to images   from 26 November 2012. CABG changes are present. Cardiac monitoring   electrodes are in position. There is progressive increased density   throughout the lungs most likely secondary to either diffuse bilateral   pneumonia or diffuse interstitial edema versus nonedematous infiltrate.   No large effusion is seen. Cardiac silhouette is enlarged. Cardiac   monitoring electrodes are present. No pneumothorax is evident. Surgical   clips are seen of the right neck likely from previous carotid surgery.    IMPRESSION:  Extensive areas of density in both lungs concerning for     bilateral pneumonia versus diffuse interstitial edema. Continued   cardiomegaly. CABG changes are present. No significant effusion.    Dictation Site: 1        Verified By: GSundra Aland M.D., MD    No Known Allergies:   Vital Signs/Nurse's Notes: **Vital Signs.:   20-Apr-14 15:23  Vital Signs Type Routine  Temperature Temperature (F) 98.9  Celsius 37.1  Temperature Source oral  Pulse Pulse 116  Respirations Respirations 18  Systolic BP Systolic BP 95  Diastolic BP (mmHg) Diastolic BP (mmHg) 60  Mean BP 71  Pulse Ox % Pulse Ox % 92  Pulse Ox Activity Level  At rest  Oxygen Delivery 3L    Impression 79year old male with h/o smoking 40 years, severe asymmetric LVH with moderate LVOT gradient,  COPD, coronary artery disease with bypass surgery in 2007, occlusion of 2 vein grafts  with PCI x4, PVD with bilateral carotid endarterectomies in 2002, history of TIA in 2002, h/o prostate CA s/p brachytherapy [facility]  [facility], episode of COPD exacerbation/bronchitis who was re-admitted yesterday for hematuria, anemia, malaise. Converting to atrial flutter last night.   Plan 1)  Malaise, hypotension, sweating (soaked second gown from sweat tonight) Concerning for bacteremia, PNA CXR showing b/l pna, only on augmentin thick cough, SOB with no improvement after 7 liters diuresis (now prerenal) --Will do blood cx, start zosyn and vanco as he is high risk of getting worse quickly given end stage lung disease.  --Will recheck troponin now to make sure he is not having ischemia (chest pain earlier today)  2) Atrial fibrillation: Converted to atrial fib last night (previous arrhythmia was atrial flutter) Could have been secondary to low potassium, albuterol nebs and inhalers, continued underlying COPD/hypoxia, PNA on CXR -Increase amiodarone to 400 mg po q8 in effort to convert. Not a candidate for anticoagulation given hematuria, nose bleeds  3. Anemia,hematuria/nose bleeds --Was on anticoagulation following recent cardioversion xarelto on hold given recent hematuria and nose bleeds --will restart amiodarone 400 mg q8, hold b-blocker and diltiazem as BP low  4. Acute on chronic COPD flare End stage lung disease, repeated long admissions for COPD exacerbation. on ABX, just finished prednisone CXR with b/l PNA  5. Prostatitis recent abx  6.  Acute on chronic diastolic CHF Started on high dose lasix IV BID on this admission. Suspect SOB is from COPD predominantly, very mild diastolic CHF --Lasix on hold secondary to renal failure. --If fact with low BP today, 7.5 liters negative, creatinein1.6, will give IVF  7. CAD s/p CABG, PCI Chest pain today likely from COPD, tachycardia (flutter) enz negative so far  8. Carotid  artery disease 9. H/o TIA 10. H/o prostate CA s/p brachytherapy   Electronic Signatures: Ida Rogue (MD)  (Signed 20-Apr-14 20:09)  Authored: General Aspect/Present Illness, History and Physical Exam, Review of System, Past Medical History, Home Medications, Labs, EKG , Radiology, Allergies, Vital Signs/Nurse's Notes, Impression/Plan   Last Updated: 20-Apr-14 20:09 by Ida Rogue (MD)

## 2014-12-09 NOTE — H&P (Signed)
DATE OF BIRTH:  Jun 14, 1936  PRIMARY CARE PHYSICIAN:  Dr. Mila Palmer  REFERRING PHYSICIAN:  Dr. Benjaman Lobe  CHIEF COMPLAINT: Shortness of breath, cough, wheezing for 2 days.   HISTORY OF PRESENT ILLNESS:  A 79 year old Caucasian male with a history of COPD,   diastolic CHF,  pneumonia, presented to the ED with cough, sputum, shortness of breath and wheezing for the past 2 days. The patient denies any fever or chills. No headache or dizziness, but has mild leg edema recently. The patient was noted to have anemia and got anemia workup as outpatient, but the patient denies any blood in stool, black stool, no bloody urine. The patient denies any orthopnea, nocturnal dyspnea or leg edema.   PAST MEDICAL HISTORY:  Hypertension, CAD, hyperlipidemia, CHF,  diastolic dysfunction, COPD, obstructive sleep apnea with oxygen at night, PVD, prostate cancer status post brachytherapy.   PAST SURGICAL HISTORY:  CABG, carotid endarterectomy, cardiac stent,  LASIK on eyes.    SOCIAL HISTORY:  The patient quit smoking 2 years ago. Denies any alcohol drinking or illicit drugs.   FAMILY HISTORY:  Significant for diabetes and CAD.  REVIEW OF SYSTEMS:    CONSTITUTIONAL: The patient denies any fever or chills. No headache or dizziness. No weakness.  EYES:  No double vision or blurry vision. EARS, NOSE, THROAT:  No postnasal drip , slurred speech or dysphagia. No epistaxis.  RESPIRATORY:  Positive for cough, sputum, shortness of breath and wheezing. No hemoptysis.  CARDIOVASCULAR:  No chest pain, palpitation, orthopnea or nocturnal dyspnea, but has mild leg edema.  GASTROINTESTINAL: No abdominal pain, nausea, vomiting or diarrhea. No melena or bloody stool.  GENITOURINARY:  No dysuria, hematuria or incontinence.   ENDOCRINOLOGY:  No polyuria, polydipsia, heat or cold intolerance.  HEMATOLOGY:  No easy bruising or bleeding, but has anemia recently.  SKIN:  No rash or jaundice.  NEUROLOGY:  No syncope, loss of  consciousness or seizure.   HOME MEDICATIONS: 1.  Zetia 10 mg p.o. 0.5 tablets once a day in the morning.  2.  Vitamin C 1000 mg p.o. daily. 3.  Ventolin HFA 90 mcg inhalation 2 puffs inhaled 4 times a day p.r.n. for shortness of breath.  4.  Symbicort 160 mcg/4.5 mcg 2 puffs twice a day. 5.  Spiriva 18 mcg inhalation 1 cap inhaled once a day. 6.  Robitussin DM 10 mL p.o. every 4 hours p.r.n. for cough.  7.  Os-Cal  500 +D 1 tablet b.i.d.  8.  Omeprazole 20 mg p.o. daily.  9.  Nystatin 100,000 units per mL/swish and swallow 5 mL b.i.d.  10.  Nitrostat 0.4 mg 1 tablet sublingual every 5 minutes p.r.n. for chest pain.  11.  Nicotrol inhaler 10 mg each inhaled every 2 hours p.r.n.  12.  Mucinex 600 mg p.o. tablets twice a day p.r.n. for congestion.  13.  Singulair 10 mg p.o. daily.  14.  Imdur 1 tablet once a day.  15.  Lasix 20 mg p.o. once a day p.r.n.  16.  Fish oil 1000 mg p.o. b.i.d.  17. Ferrous sulfate 325 mg p.o. t.i.d.  18.  Eligard 45 mg per 6 months 1 injection subcutaneous.  19.  Crestor 20 mg p.o. daily at bedtime.  20.  Colace 100 mg p.o. b.i.d.  21.  Codeine/guaifenesin 10 mg/100 mg per 5 mL. 22.  Oral Cipro 10 mL every 4 hours p.r.n.  23.  Clonidine 0.2 mg p.o. tablet, 0.5  tablets b.i.d.  24.  Centrum Silver p.o.  tablet once a day.  25.  Coreg 6.25 mg p.o. b.i.d.  26.  Aspirin 81 mg 2 tablets once a day.  27.  Norvasc 10 mg p.o. tablets, 0.5 tablets once a day.  28.  Alprazolam 0.5 mg p.o. every 6 hours p.r.n.  29.  DuoNeb 3 mL inhaled 4 times a day p.r.n.  30.  Acetaminophen/tramadol 1 tablet p.o. 1 to 2 times a day p.r.n. for joint pain.  31.  Acetaminophen/hydrocodone 325 mg/5 mg p.o. tablet every 4 hours p.r.n.   VITAL SIGNS: Temperature 98.1, blood pressure 149/66, pulse 76, respirations 22, oxygen saturation 98% on oxygen by nasal cannula.   PHYSICAL EXAMINATION:  GENERAL:   The patient is alert, awake, oriented, in no acute distress.  HEENT:  Pupils round,  equal, reactive to light and accommodation. Moist oral mucosa. Clear oropharynx.  NECK:  Supple. No JVD or carotid bruit. No lymphadenopathy. No thyromegaly.  CARDIOVASCULAR:  S1, S2.  Regular rate and rhythm. No murmurs or gallops.  PULMONARY: Bilateral air entry. Bilateral wheezing and crackles, but no use of accessory muscles to breathe.  ABDOMEN:  Soft. No distention. No tenderness. No organomegaly. Bowel sounds present.  EXTREMITIES: Mild leg edema, but no cyanosis. No calf tenderness. Strong bilateral pedal pulses.  SKIN:  No rash or jaundice.  NEUROLOGIC:  Alert and oriented x 3. No focal deficits. Power 5/5. Sensation intact.   LABORATORY DATA:  Urinalysis is negative. BNP 292. CK 23, CK-MB less than 0.5. Glucose 104, BUN 21, creatinine 1.14, electrolytes normal. WBC 9.1, hemoglobin 8.7, platelets 216. Troponin less than 0.02. Anemia workup 2 days ago showed retic count 3.5, direct Coombs test negative.  EKG shows sinus rhythm with occasional PVCs at 79 BPM.  Chest x-ray showed no infiltrate, but chronic changes, with mild interstitial markings. Please refer to the report reported by Radiology.   IMPRESSIONS: 1.  Chronic obstructive pulmonary disease exacerbation, with hypoxia.  2.  Anemia.  3.  Congestive heart failure with diastolic dysfunction, compensated.  4.  Coronary artery disease.  5.  Peripheral vascular disease.  6.  Obstructive sleep apnea.   PLAN OF TREATMENT:   1.  The patient will be admitted to a medical floor. Will start Solu-Medrol, Zithromax. Continue DuoNeb, Symbicort, Spiriva and Ventolin.  2.  Will start congestive heart failure protocol, daily weight, fluid restriction, and give Lasix 20 mg p.o. daily.  3.  For anemia, we will follow up CBC and get stool occult test. If it is positive, may need a GI consult.  4.  We will continue aspirin, hypertension medication and Zocor.  5.  Gastrointestinal and deep vein thrombosis prophylaxis.   Discussed the patient's  situation and plan of treatment with the patient.   TIME SPENT:  About 58 minutes.    ____________________________ Demetrios Loll, MD qc:mr D: 10/08/2012 19:03:00 ET T: 10/08/2012 20:17:35 ET JOB#: [id number]  cc: Demetrios Loll, MD, <Dictator> Demetrios Loll MD ELECTRONICALLY SIGNED 10/09/2012 14:26

## 2014-12-09 NOTE — Consult Note (Signed)
General Aspect 79 year old male with h/o smoking 40 years, frequent admissions for severe COPD exacerbations/bronchitis, coronary artery disease with bypass surgery in 2007, occlusion of 2 vein grafts with PCI x4, PAF/flutter (off anticoagulation 2/2 hematuria), PVD with bilateral carotid endarterectomies in 2002, history of TIA in 2002, h/o prostate CA s/p brachytherapy, h/o GIB/hematuria and anemia who was transported to [facility] ED for admission from the office yesterday for chest pain and SOB.  He has multiple recent admissions for chest pain, acute on chronic diastolic CHF and COPD.  Hospitalization in May 2013 chest pain, COPD exacerbation. Stress test showed no ischemia. Echocardiogram 11/18/12:  EF 55-60%, rhythm atrial flutter, mild LA dilatation, mildly dilated LA, mild TR, mildly elevated PASP. Rhythm noted to be atrial flutter.  He followed up in the office yesterday and was noted to be dyspneic, hypotensive (SBP 60-70s), dizziness and c/o chest discomfort. He had been in his USOH previously that morning. He was sitting his is wheelchair when he became acute lightheaded, did not stand, no precipatory chest pain. Thereafter, he developed substernal chest burning without radiation and shortness of breath. Family was present who insisted he be admitted. He was transferred to [facility] ED for further evaluation.   Present Illness There, EKG revealed rate controlled a-fib without ST/T changes. Initial trop-I WNL. CBC- WBC 16.6, Hgb 9.9/Hct 30. BMET unremarkable. CXR with pulmonary edema and small bilateral pleural effusions. BPs labile in the ED. Chest pain was improved by ASA, NTG and morphine. He was admitted by the medicine service. Antihypertensives were held and he was gently hydrated. Cardiology has been consulted for further cardiac management.   Prior Carotid u/s done in the hospital 01/12/2012 shows no significant carotid disease.   PAST SURGICAL HISTORY: CABG, carotid endarterectomy, cardiac  stent, Lasik surgery on the eye.    SOCIAL HISTORY: He quit smoking 2 years ago. Denies any alcohol drinking or illicit drug use.   FAMILY HISTORY: Significant for diabetes and coronary artery disease.   Physical Exam:  GEN well developed, obese   HEENT pink conjunctivae, PERRL, hearing intact to voice   NECK supple  No masses  trachea midline   RESP postive use of accessory muscles  wheezing  rhonchi  course rhonchi, no appreciable wheezing, decrease bibasilar breath sounds, fine bibasilar rales   CARD Irregular rate and rhythm  III/VI systolic murmur at LUSB/LLSB   ABD denies tenderness  soft  distended  hypoactive BS   EXTR negative cyanosis/clubbing, trace-1+ bilateral pedal edema   SKIN normal to palpation   NEURO follows commands, motor/sensory function intact   PSYCH A+O to time, place, person, lethargic   Review of Systems:  Subjective/Chief Complaint shortness of braeth   General: Weight loss or gain  Fatigue  Weakness  Trouble sleeping   Skin: No Complaints    ENT: No Complaints    Eyes: No Complaints    Neck: No Complaints    Respiratory: Short of breath  Wheezing   Cardiovascular: Chest pain or discomfort  Tightness  Palpitations  Dyspnea  Orthopnea  Edema   Gastrointestinal: No Complaints  Nausea   Genitourinary: No Complaints    Vascular: No Complaints    Musculoskeletal: Muscle or joint pain   Neurologic: Dizzness   Hematologic: No Complaints    Endocrine: No Complaints    Psychiatric: No Complaints    Review of Systems: All other systems were reviewed and found to be negative    Medications/Allergies Reviewed Medications/Allergies reviewed      Anemia,  Chronic:    CVA/Stroke:    MI - Myocardial Infarct:    prostate cancer with brachytherapy:    prostate cancer with bradytherapy:    peripheral vascular disease:    HTN:    CAD:    COPD:    Bronchitis:    High cholesterol:    angina:    triple bypass:    lasik  surgery both eyes:    4 cardiac stents:    Carotid Endarterectomy:   Home Medications: Medication Instructions Status  Symbicort 160 mcg-4.5 mcg/inh inhalation aerosol 2 puff(s) inhaled 2 times a day (0900, 1800) Active  Eligard 45 mg/6 months subcutaneous injection, extended release 45 milligram(s) subcutaneous every 6 months on the first of April and October (1000) Active  ferrous sulfate 325 mg oral tablet 1 tab(s) orally 2 times a day (0900, 1800) Active  Os-Cal Calcium+D3 500 mg-200 intl units oral tablet 1 tab(s) orally 2 times a day Active  guaiFENesin 600 mg oral tablet, extended release 1 tab(s) orally 2 times a day, As Needed for cough/congestion Active  docusate sodium sodium 100 mg oral capsule 1 cap(s) orally 2 times a day (0800, 1800) Active  Spiriva 18 mcg inhalation capsule 1 each inhaled once a day (0900) Active  amiodarone 200 mg oral tablet 1 tab(s) orally once a day (0900) Active  ascorbic acid 500 mg oral tablet 2 tab(s) (1000 mg) orally once a day (0900) Active  Os-Cal 500 + D 500 mg-400 intl units oral tablet, chewable 1 tab(s) orally 2 times a day (0900, 1800) Active  Crestor 20 mg oral tablet 1 tab(s) orally once a day (at bedtime) (2100) Active  Diltiazem Hydrochloride ER 180 mg/24 hours oral capsule, extended release 1 cap(s) orally once a day (0900) Active  Fish Oil 500 mg oral capsule 2 cap(s) (1000 mg) orally once a day (0900) Active  Flomax 0.4 mg oral capsule 1 cap(s) orally once a day (0900) Active  acetaminophen-HYDROcodone 325 mg-5 mg oral tablet 1 tab(s) orally every 4 hours, As Needed - for Pain Active  losartan 25 mg oral tablet 1 tab(s) orally once a day (0900) Active  montelukast 10 mg oral tablet 1 tab(s) orally once a day (at bedtime) (2100) Active  omeprazole 40 mg oral delayed release capsule 1 cap(s) orally 2 times a day (0630, 1630) Active  Senna 8.6 mg oral tablet 1 tab(s) orally once a day (0900) Active  sertraline 50 mg oral tablet 1 tab(s)  orally once a day (0900) Active  multivitamin 1 tab(s) orally once a day (0900) Active  levalbuterol 1.25 mg/3 mL inhalation solution 3 milliliter(s) (1 vial) inhaled every 4 hours, As Needed - for Shortness of Breath Active  ALPRAZolam 0.25 mg oral tablet 1 tab(s) orally every 6 hours, As Needed for anxiety Active  furosemide 20 mg oral tablet 1 tab(s) orally once a day, As Needed for weight gain >3 lbs in 24 hours and 4 lbs in 7 days, or if edema worsens or severe shortness of breath Active  morphine 20 mg/mL oral concentrate 0.25 milliliter(s) orally every 4 hours, As Needed - for Pain Active  Ventolin HFA 90 mcg/inh inhalation aerosol 2 puff(s) inhaled every 4 hours, As Needed - for Shortness of Breath Active   Lab Results:  Routine Chem:  07-May-14 04:09   Glucose, Serum  101  BUN  29  Creatinine (comp)  1.36  Sodium, Serum 138  Potassium, Serum 3.9  Chloride, Serum 103  CO2, Serum 26  Calcium (  Total), Serum 8.5  Anion Gap 9  Osmolality (calc) 282  eGFR (African American)  59  eGFR (Non-African American)  51 (eGFR values <85m/min/1.73 m2 may be an indication of chronic kidney disease (CKD). Calculated eGFR is useful in patients with stable renal function. The eGFR calculation will not be reliable in acutely ill patients when serum creatinine is changing rapidly. It is not useful in  patients on dialysis. The eGFR calculation may not be applicable to patients at the low and high extremes of body sizes, pregnant women, and vegetarians.)  Cardiac:  07-May-14 04:09   CK, Total  14  CPK-MB, Serum  < 0.5 (Result(s) reported on 23 Dec 2012 at 05:18AM.)  Troponin I < 0.02 (0.00-0.05 0.05 ng/mL or less: NEGATIVE  Repeat testing in 3-6 hrs  if clinically indicated. >0.05 ng/mL: POTENTIAL  MYOCARDIAL INJURY. Repeat  testing in 3-6 hrs if  clinically indicated. NOTE: An increase or decrease  of 30% or more on serial  testing suggests a  clinically important change)  Routine  Hem:  07-May-14 04:09   WBC (CBC)  16.0  RBC (CBC)  3.31  Hemoglobin (CBC)  9.5  Hematocrit (CBC)  29.1  Platelet Count (CBC) 176  MCV 88  MCH 28.8  MCHC 32.7  RDW  17.8  Neutrophil % 79.2  Lymphocyte % 13.0  Monocyte % 7.4  Eosinophil % 0.1  Basophil % 0.3  Neutrophil #  12.7  Lymphocyte # 2.1  Monocyte #  1.2  Eosinophil # 0.0  Basophil # 0.1 (Result(s) reported on 23 Dec 2012 at 04:58AM.)   EKG:  Interpretation atrial fibrillation, no ST/T changes, RBBB   Rate 99    Prednisone: Unknown  Vital Signs/Nurse's Notes: **Vital Signs.:   07-May-14 08:00  Vital Signs Type Routine  Temperature Temperature (F) 99.1  Celsius 37.2  Temperature Source oral  Pulse Pulse 99  Respirations Respirations 20  Systolic BP Systolic BP 98  Diastolic BP (mmHg) Diastolic BP (mmHg) 63  Mean BP 74  Pulse Ox % Pulse Ox % 95  Pulse Ox Activity Level  At rest  Oxygen Delivery 3L; Nasal Cannula    Impression 79year old male with h/o smoking 40 years, frequent admissions for severe COPD exacerbations/bronchitis, coronary artery disease with bypass surgery in 2007, occlusion of 2 vein grafts with PCI x4, persistent AF/flutter (off anticoagulation 2/2 hematuria), PVD with bilateral carotid endarterectomies in 2002, history of TIA in 2002, h/o prostate CA s/p brachytherapy, h/o GIB/hematuria and anemia [facility] chest pain and SOB.   1. Acute on chronic respiratory failure, multifactorial multiple frequent admissions for COPD flares, and just recently completed a prednisone taper.  Suspect this is multifactorial secondary to COPD, acute on chronic diastolic CHF, atrial fibrillation and will take a comprehensive treatment for his underlying driving comorbidities. O2 sat's mid 90s on 3L O2.  -- Continue pulmonary toilet. Pulm/CCM consult would be of benefit to treat pulmonary dysfunction.   2. Persistent atrial fibrillation Noted  on telemetry today and EKG yesterday. Rate elevated. Started on digoxin.  On amiodarone outpatient. No anticoagulation due to GIB/hematuria and underlying anemia.  -- Restart amiodarone at 2080mBID -- Hold antihypertensives, hold diltiazem and metoprolol given low BP  3. Acute on chronic diastolic CHF Suspect atrial fibrillation +/- cardiac ischemia underlying. He does have evidence of pulmonary edema/pleural effusions on CXR. He has a very narrow euvolemic window, and minimal amounts of fluid accumulation likely  decompensate his respiratory status. Cr has trended up today.  -- Hold IVF to avoid fluid overload -- No diuretics today, re-evaluate potential for this tomorrow, will need to check renal function   Plan . 4. CAD s/p CABG, multiple PCIs Chest discomfort in the office and ED yesterday very well may have been secondary to coronary underperfusion from hypotension on a background of underlying anemia and coronary disease. Question is whether CAD has progressed or remained stable. Last stress test about 1 year ago was normal.  -- Will need a repeat nuclear stress test to evaluate for ischemia, could be done as an outpt   5. Hypotension Description of hypotension in the office supports vasodepressor +/- iatrogenic/medicine etiologies. Labile BPs in the ED and this AM limiting management of a-fib.  -- Hold IVF with A/C diastolic CHF -- Hold antihypertensives  6. Normocytic anemia Will need to ensure H/H remain stable and gradually improve. Does take iron supplementation as an outpatient. Denies active bleeding.  -- Continue iron supplementation -- Add stool softeners with this  7. Prerenal azotemia, mild Hold off on IVF with CHF.  -- Monitor with daily BMETs   Electronic Signatures: Meriel Pica (PA-C)  (Signed 07-May-14 10:55)  Authored: General Aspect/Present Illness, History and Physical Exam, Review of System, Past Medical History, Home Medications, Labs, EKG , Radiology,  Allergies, Vital Signs/Nurse's Notes, Impression/Plan Ida Rogue (MD)  (Signed 07-May-14 16:14)  Authored: History and Physical Exam, Review of System, Impression/Plan  Co-Signer: General Aspect/Present Illness, Past Medical History, Home Medications, Labs, EKG , Allergies, Vital Signs/Nurse's Notes, Impression/Plan   Last Updated: 07-May-14 16:14 by Ida Rogue (MD)

## 2014-12-09 NOTE — Discharge Summary (Signed)
PATIENT NAME:  Javier [name], Javier [name] MR#:  [id number] DATE OF BIRTH:  [birth year]  DATE OF ADMISSION:  08/31/2012  DATE OF DISCHARGE:  09/03/2012  ADMITTING DIAGNOSIS:  Shortness of breath, cough.   DISCHARGE DIAGNOSES: 1. Shortness of breath and cough due to acute on chronic obstructive pulmonary disease exacerbation, as well as possible acute on chronic diastolic congestive heart failure.  2.  Possible acute bronchitis.  3.  History of prostate cancer, status post brachytherapy.  4.  History of pneumonia x 2 in the past.  5.  Peripheral vascular disease.  6.  Coronary artery disease.   7.  Hypertension. 8.  Obstructive sleep apnea with O2 at night, unable to tolerate CPAP.  9.  Hyperlipidemia.  10.  Status post coronary artery bypass graft.  11.  Status post carotid endarterectomy.  12.  Status post LASIK surgery on the eyes.  13.  Status post cardiac stent.   CONSULTANTS: Dr. Raul Del.   PERTINENT LABORATORY AND EVALUATIONS: Admitting glucose 118.  BNP was 455. BUN 24, creatinine was 1.29, sodium 151, potassium was 3.5, chloride 107, CO2 was 24. Troponin was less than 0.02. WBC count was 17.2, hemoglobin 11.5, platelet count was 197. Chest x-ray showed bilateral diffuse interstitial thickening representing possible interstitial edema versus interstitial pneumonitis, secondary infection or inflammatory etiology.  blood cultures x 2: No growth. EKG showed sinus rhythm with PACs.   HOSPITAL COURSE: Please refer to H and P done by the admitting physician. The patient is a pleasant 79 year old white male with history of coronary artery disease, COPD, sleep apnea, who is on home O2 at nighttime only, and has a history of previous pneumonia x 2 in the past, who presented with complaint of shortness of breath. The patient had recently been seen by his primary care provider and was prescribed p.o. azithromycin, as well as prednisone taper; however, his shortness of breath continued progressively worse,  as well as his cough had worsened. The patient arrived in the ED with diffuse wheezing, where he was treated with IV Solu-Medrol and nebulizer treatment without much improvement. We were asked to admit the patient. The patient also was thought to have possible flare of his acute diastolic CHF. The patient was treated with nebulizers, steroids and antibiotics. With these treatments, he slowly started to improve. He was seen by Dr. Raul Del, who felt that he was doing much better today, and agreed with his discharge. The patient is stable for discharge home.   DISCHARGE INSTRUCTIONS:  The patient is to weigh himself every day same time, preferably the first thing in the morning. Call physician if gains more than 2 pounds in 1 day or more than 5 pounds in 1 week.   DISCHARGE MEDICATIONS:  Symbicort 2 puffs b.i.d., Zetia 10, half tab daily in the morning; Centrum Silver 1 tab p.o. daily, vitamin C 1000 mg 1 tab p.o. daily, Os-Cal plus vitamin D 1 tab p.o. b.i.d., Spiriva 18 mcg 1 cap in handheld inhaler daily, Eligard 45 mg every 6 months subcutaneous injection, Nitrostat 0.4 sublingual  every 5 minutes p.r.n. for chest pain, alprazolam 0.5, 1 tab p.o. q.6 p.r.n. for anxiety, clonidine 0.2 mg half tab p.o. b.i.d., amlodipine 5 mg daily, Crestor 20 daily, Ventolin 2 puffs inhalation 4 times a day as needed, albuterol/Atrovent nebs 4 times per day as needed for wheezing, Lasix 20 mg, 1 tab p.o. daily as needed for lower extremity swelling, acetaminophen/tramadol 325/37.5, 1 to 2 times as needed for joint pain daily, Mucinex 600  mg 1 tab p.o. b.i.d. as needed for congestion, isosorbide dinitrate 30 mg 1 tab p.o. daily, omeprazole 20 mg 1 tab p.o. daily, carvedilol 6.25 mg 1 tab p.o. daily, aspirin  81 mg, 2 tabs daily in the morning, fish oil 1000 mg, 1 tab p.o. b.i.d., Singulair 10 daily, nicotine inhaler 10 mg 2 hours as needed, Colace 100 mg, 1 tab p.o. b.i.d., Robitussin 10 mL q.4 p.r.n., acetaminophen/hydrocodone  325/5, 1 tab p.o. daily as needed for severe pain; prednisone taper 60 mg, taper down by 10 mg until complete; nystatin powder 2 times a day as needed, Levaquin 500 mg 1 tab p.o. daily for the next 4 days, codeine/guaifenesin 10 mg/100 mg/5 mL/10 mL q.4 p.r.n. for cough, O2 at nighttime p.r.n.   DIET: Low sodium, low fat, low cholesterol.   ACTIVITY: As tolerated.   FOLLOWUP INSTRUCTIONS: Follow with Dr. Leonides Sake in 1 to 2 weeks. Follow with Dr. Raul Del in 2 to 4 weeks.   TIME SPENT:  35 minutes.  ____________________________ Lafonda Mosses Posey Pronto, MD shp:dm D: 09/03/2012 17:04:00 ET T: 09/03/2012 20:22:55 ET JOB#: [id number]  cc: Anya Murphey H. Posey Pronto, MD, <Dictator> Dr. Leonides Sake Dr. Jayme Cloud MD ELECTRONICALLY SIGNED 09/07/2012 16:44

## 2014-12-09 NOTE — Telephone Encounter (Signed)
Pt calling stating that couple of weeks back dr Rockey Situ ordered a monitor He had a seizer, and ended up in [place] for a few days.  They took him off a few medications, and made him wear their monitor.they made him wear their while he was in the hospital.   He has our monitor but does not know if he should put it back on. He has not worn it four about 8 days.  Needs to know what to do. Was suppose to keep it on for 30 days. He only wore our monitor week or two not sure. States it may have been a little over 2 weeks.  Please advise.

## 2014-12-09 NOTE — Discharge Summary (Signed)
PATIENT NAME:  Javier [name], Javier [name] MR#:  [id number] DATE OF BIRTH:  [birth year]  DATE OF ADMISSION:  12/04/2012 DATE OF DISCHARGE:  12/11/2012  DISPOSITION: To [facility].  DISCHARGE DIAGNOSES: 1.  Acute on chronic diastolic heart failure.  2.  Chronic obstructive pulmonary disease exacerbation.  3.  Healthcare-associated pneumonia ,likley bacterial.  4.  Chronic atrial fibrillation.  5.  Hematuria and anemia secondary to Xarelto, resolved. Now off Xarelto. 6.  Hypertension.  7.  History of prostate cancer status post brachytherapy.  8.  Sepsis, developed during inpatient stay, which was treated, but sepsis was not present on admission.  DISCHARGE MEDICATIONS: 1.  Symbicort 160/4.5 2 puffs b.i.d.  3.  Ventolin 90 mcg 2 puffs 4 times daily as needed for trouble breathing.  4.  Imdur 30 mg p.o. daily.  5.  Ferrous sulfate 325 mg p.o. b.i.d.  6.  Nitrostat sublingual p.r.n. for chest pain.  7.  Colace 100 mg p.o. b.i.d.  8.  Eligard 45 mg sub-Q every 6 months.  9.  Losartan 50 mg p.o. daily.  10.  Tamsulosin 0.4 mg p.o. daily.  11.  Coreg 6.25 mg p.o. b.i.d. 12.  Amiodarone 200 mg p.o. daily.  13.  Morphine (Roxanol) 0.25 mL every 4 hours p.r.n. for dyspnea and pain.  14.  Cardizem 300 mg p.o. daily.  15.  Spiriva 18 mcg inhalation daily.  16.  Montelukast 10 mg p.o. daily.  17.  Senna 1 tablet daily.  18.  Vitamin C 1 gram p.o. daily.  19.  Omega-3 fatty acids 1 tablet daily.  20.  Xanax 0.25 mg p.o. q. 6 hours as needed for anxiety.  21.  Crestor 20 mg p.o. daily.  22.  Omeprazole 40 mg p.o. b.i.d.  23.  Zoloft 50 mg p.o. daily.  24.  Prednisone 20 mg 3 tablets daily for 2 days, 2 tablets daily for 2 days, 1 tablet daily for 2 days and then stop.  25.  Zyvox 600 mg p.o. b.i.d. for 10 days.  26.  Percocet 5/325 mg 1 tablet every 4 hours as needed for pain.  OXYGEN: 2 liters via nasal cannula.   DIET: Low sodium, low fat diet. Fluid restriction up to 1.4 liters per day.    CONSULTANTS:  Cardiology with Dr. Esmond Plants, pulmonary with Dr. Raul Del and palliative care with Dr. Ermalinda Memos.   She was also seen by Dr. Leia Alf.   HOSPITAL COURSE: 1.  CHF exacerbation. The patient is a 79 year old male patient with history of hypertension, congestive heart failure, COPD on chronic oxygen therapy and chronic atrial fibrillation who had been hospitalized in April, from 10th to 14th, and also April 1st to 9th. The last time he was admitted for atrial flutter and COPD. Second time he was admitted for hematuria and CHF exacerbation.  He came in because of shortness of breath. He was admitted on the 18th.  He had shortness of breath with pedal edema and also orthopnea.  The patient's BNP was slightly elevated on admission to 1404 and chest x-ray was consistent with some pulmonary edema. The patient was admitted to hospitalist service, on telemetry, for CHF exacerbation.  Started on IV Lasix along with Foley catheter was continued. Daily ins and outs were maintained. The patient's shortness of breath slightly got better the next day, but the patient developed hypotension and also lethargy.  The patient's Lasix was held.  He received around 250 mL of fluid bolus, 2 times, and the patient persistently was  hypotensive on 20th of April.  The patient also complained of chest pain at that time.  The patient's EKG did not show any acute new changes and troponins were negative.  Dr. Rockey Situ has seen the patient regarding his CHF.  Right now his volume status improved. He is right now only on Lasix 20 mg daily and we had to hold 40 IV q. 12 hours.  The patient's blood pressure improved nicely on the following days. Right now he is more euvolemic and he is clinically better in terms of shortness of breath. Dr. Rockey Situ recommended he can continue 20 mg daily of Lasix and he recommended some fluid restriction because the patient returned back to atrial fibrillation.  2.  COPD exacerbation and also has  healthcare-associated pneumonia. The patient initially did not have any pneumonia, but the next day he developed hypotension and chest x-ray was showing extensive areas of density in both lungs concerning for bilateral pneumonia. The patient also was hypotensive and lethargic so it was thought that the patient might have impending sepsis, later white count of 16.5, on the 20th, BUN 29 and creatinine 1.60.  Started on Vanco and Zosyn and also blood cultures obtained. The patient's blood culture did not show any growth. WBC returned to 11.3 on the following day, 21st of April, and also creatinine improved to 2.8 and 1.4 after stopping Lasix and received couple of fluid boluses. The patient since then remained afebrile and blood pressure improved nicely. The patient received antibiotics, from 20th of April, with vanc and Zosyn.  Because of previous history of antibiotic use and multiple hospitalizations, we are going to give him Zyvox to cover for MRSA and he can continue that for 10 days. The patient also was seen by Dr. Raul Del, pulmonology, because of his severe COPD. The patient had a repeat chest x-ray which did not show much change even though he was clinically better. The patient is right now already on Symbicort and Spiriva along with we discharged on tapering course of prednisone that he can continue along with oxygen.  3.  The patient also received palliative care because of multiple medical problems and repeated hospitalizations. He is a FULL CODE now and he understands that he has severe medical problems and prognosis is poor. 4.  History of chronic atrial fibrillation and history of atrial flutter. The patient was cardioverter before and he also received Xarelto.  When he came he had some hematuria and epistaxis so Dr. Rockey Situ said to stop Xarelto and right now he is rate controlled with amiodarone and also Cardizem and Coreg. Dr. Rockey Situ said not to give Xarelto anymore.  5.  History of urinary retention  and hematuria.  Seen by Dr. Delma Officer, the urologist.  The patient had bladder training done and his Foley catheter is removed.  Right now he is on Flomax. He has a urologist in [place]. He can follow up with him. He is on Eligard every 6 months. The patient has been able to void well without the Foley catheter. 6.  History of coronary artery disease status post CABG and deconditioning. He was seen by a physical therapist.  7.  Anemia of chronic disease. The patient also has slight thrombocytopenia with platelets of 98. Seen by Dr. Leia Alf. The patient had erythropoietin level drawn which is showing 21.3.  He also had ( leucocyte alkaline phosphatase which is within normal range and his folic acid is 36.6. Vitamin B12 level is 585. Haptoglobin level is 227.  The patient had fibrinogen of 731 and FDP is less than 10. The patient right now is stable.  PERTINENT LABORATORY AND DIAGNOSTICS:  Since admission. EKG on admission: Normal sinus rhythm. Chest x-ray on admission:  Congestive heart failure and pulmonary edema and previous CABG changes.   Initial WBC on April 18th of 8.3, hemoglobin 9.5, hematocrit 28.6, platelets 107.  The patient's leukocytosis is thought to be secondary to steroids.  BNP 1404. Troponin less than 0.02. The patient's white count gradually improved with antibiotics to 11.3 on April 21st.  Blood cultures did not show any growth. The patient's occult blood in the stool positive.  Chest x-ray, repeated on 24th of April, consistent with some edema.   The patient's condition is stable today.  The patient is discharged to [facility].  TIME SPENT ON DISCHARGE PREPARATION: More than 30 minutes. ____________________________ Epifanio Lesches, MD sk:sb D: 12/11/2012 13:04:10 ET T: 12/11/2012 13:39:11 ET JOB#: [id number]  cc: Epifanio Lesches, MD, <Dictator> Minna Merritts, MD Murlean Iba, MD PCP Epifanio Lesches MD ELECTRONICALLY SIGNED 12/14/2012 21:13

## 2014-12-10 NOTE — Consult Note (Signed)
PATIENT NAME:  Javier [name], Javier [name] MR#:  [id number] DATE OF BIRTH:  [birth year]  DATE OF CONSULTATION:  10/09/2013  CONSULTING PHYSICIAN:  Rogue Jury, MD  REQUESTING PHYSICIAN: Internal medicine hospitalist.   REASON FOR CONSULTATION: TIA.   HISTORY OF PRESENT ILLNESS: The patient is a 79 year old white male who was admitted to the hospital after he developed an acute onset of left face, arm and leg weakness associated with incoherent speech and confusion while watching TV in bed as witnessed by his wife. About 5 to 10 minutes after this occurred, he develop significant abdominal pain, associated with bilateral lower extremity excruciating leg pain from the knees down to the feet and throbbing pulsating painful sensation in both legs. The left face, arm and leg symptoms, which included mainly, weakness and aphasia all resolved in about 15 minutes. Most of the abdominal pain and bilateral lower extremity pain have resolved with some residual still there at a lower level of pain. An ultrasound of the abdomen was done when he came in, which showed a 5.7 cm abdominal aortic aneurysm. The patient does have a history of atrial fibrillation and is on Coumadin for cardioembolic stroke prevention; however, since he has COPD and often put on prednisone for COPD exacerbations, it has been experienced in the past with him that prednisone increases his INR dramatically and therefore he was taken off the Coumadin while he was having the prednisone treatment and only recently restarted back on it. Therefore when he arrived to the hospital, his INR was subtherapeutic at 1.4. CT scan of the brain was done, which I reviewed personally. It indicates no acute hemorrhages. There is no abnormal hypodensities or ventriculomegaly.  A carotid Doppler ultrasound has been performed, which I reviewed personally. It indicates no extracranial carotid stenosis. Transthoracic echocardiogram is pending. The patient has had 4 prior  identical episodes of left face, arm and leg weakness lasting for about 15 minutes in the past. His complete metabolic panel is normal. His LDL is 65, HDL is 48, triglycerides are 207. Troponin I is negative. TSH is 2.85. CBC is normal except for a hemoglobin of 11 with a normal MCV and platelets are low at 78,000 without a clear history that he is aware of. The patient quit smoking about 2 years ago. Urinalysis is normal   PAST MEDICAL HISTORY: 1.  TIA.  2.  Recently identified abdominal aortic aneurysm.  3.  Atrial fibrillation.  4.  Depression.  5.  COPD and asthma.   PAST SURGICAL HISTORY: Negative.   CURRENT MEDICATIONS IN THE HOSPITAL:  1.  Aspirin 81 mg daily.  2.  Metoprolol 50 mg b.i.d.  3.  Singulair 10 mg daily.  4.  Zoloft 50 mg daily.  5.  Coumadin 3 mg 3 days a week alternating with 1.5 mg 4 times a week.  ALLERGIES:  AS DOCUMENTED IN THE ELECTRONIC RECORD.  SOCIAL HISTORY: Denies current smoking and quit 2 years ago. No history of alcohol or drug abuse.   FAMILY HISTORY: Not a contributor.   REVIEW OF SYSTEMS: Reveals no fever, no meningismus. No diplopia. No dysphagia. No chest pain.  No shortness of breath. No diarrhea or constipation.  All other review of systems are negative.   PHYSICAL EXAMINATION: VITAL SIGNS: Blood pressure is 143/75, pulse of 82, temperature 97.2.  HEART: Regular rate and rhythm, S1, S2. No murmurs.  LUNGS: Clear to auscultation.  NECK: There are no carotid bruits.  NEUROLOGIC: He is awake and alert. Language is fluent.  Comprehension, naming and repetition are intact. He is fully oriented. Pupils are equal and reactive. Extraocular movements are intact. Face is symmetrical. Tongue is midline. Strength is 5 out of 5 bilaterally in the upper and lower extremities in all muscle groups. There is no pronator drift. There is no Babinski. There is no Hoffmann sign. Reflexes are +1 and symmetrical. Coordinations with finger-to-nose and heel-to-shin are  intact and symmetrical. Sensory testing is intact. Gait testing was deferred at this time due to leg pain.   IMPRESSION AND PLAN: The patient likely suffered another recurrent transient ischemic attack involving the right internal capsule most likely causing left face, arm and leg weakness for 15 minutes. This was likely cardioembolic given his history of atrial fibrillation and no other uncontrolled risk factor. He quit smoking, only 2 years ago, which does indicate that smoking is still an active risk factor for him and his level does not return to a level of an nonsmoker for a full five years. His INR was subtherapeutic and therefore the risk of cardioembolic transient ischemic attack was increased.   I am not sure how to correlate that transient ischemic attack with the recently found abdominal aortic aneurysm and its associated symptoms of abdominal pain with lower extremity bilateral pain. Certainly the abdominal aortic aneurysm could have caused intramural thrombus with distal embolism to both legs causing the pain and throbbing in both legs; however, I am not sure why this occurred at the exact same time as the transient ischemic attack. Nevertheless, due to the fact that his abdominal aortic aneurysm is greater than 5 cm it is at higher risk of rupture and should likely be treated and I will defer that to the vascular surgeon for further discussions with him.  Since his INR is subtherapeutic and he has recently had a transient ischemic attack and may have potentially had an intramural abdominal aortic aneurysm clot, he should be on Lovenox as a bridge until his INR is greater than 2.0. Therefore, I will start him on Lovenox 1 mg/kg subcutaneously every 12 hours while we await the INR to become therapeutic.   The patient is incidentally left-handed and therefore this may have explained why he had aphasic symptoms during his transient ischemic attack.     ____________________________ Rogue Jury, MD se:dp D: 10/09/2013 13:31:17 ET T: 10/09/2013 13:56:07 ET JOB#: [id number]  cc: Rogue Jury, MD, <Dictator> Rogue Jury MD ELECTRONICALLY SIGNED 11/09/2013 18:07

## 2014-12-10 NOTE — Consult Note (Signed)
General Aspect 79 y/o male with a h/o CAD s/p CABG, severe COPD and chronic bronchitis, chronic orthostasis, syncope, chronic afib on eliquis, and PAD, recent admissions for chest pain and  SOB. Cardiology was consulted for chest pain.   Present Illness . 79 y/o male with a h/o CAD s/p CABG in 2007.  His last cath was in 06/2013, revealing patent LM stent with 2/3 patent grafts (known occlusion to the VG->Diag).  He also has a h/o chronic diast chf (EF 50-55% 09/2013), afib (on eliquis), Rem tobacco abuse (1/2 ppd x 50 yrs, quit 2014) with O2 dependent COPD and chronic bronchitis, AAA s/p endovascular repair 09/2013, HTN, TIA s/p bilat CEA (2002), and a long h/o orthostatic hypotension with periodic syncope.   He presents with chest pain. He was going through papers when he had acute onset of chest pain, 8/10. He took NTG and pain decreased to 5/10. He took anouther NTG and called 911. BP was low on their arrival. He was given ASA and morphine in the ER with improvement of his pain. Cardiac enz negative x 3. He reports working with PT this past week with no pain on exertion. Most of his recent chest pain sx seem to happen at rest. He deneis any GI type sx (belching, buring) His weight has been stable and he has been adjusting his home lasix dose accordingly.    Admitted 10 days ago with SOB, improved with diuresis. interestingly no significant weight gain or edema on admission.  Admitted in 01/07/14 with syncope, turned in 30 day even monitor. No arrhythmia noted, though he had no syncope.    admitted to [facility] in March 2015 with for diast chf exacerbation.  He was effectively diuresed and since that discharge, he has done reasonably well.    He continues to have problems with anxiety.   In the ER, he was started on lasix IV.   Physical Exam:  GEN well developed, pleasant, nad.   HEENT hearing intact to voice, moist oral mucosa   NECK supple  No masses  no bruits/jvd.   RESP normal resp effort   clear BS  diffuse scant rales   CARD Irregular rate and rhythm  Normal, S1, S2  No murmur   ABD denies tenderness  soft  normal BS   LYMPH negative neck   EXTR negative cyanosis/clubbing, negative edema   SKIN normal to palpation   NEURO motor/sensory function intact, grossly intact, nonfocal.   PSYCH alert, A+O to time, place, person, good insight   Review of Systems:  Subjective/Chief Complaint feels ok, chest pain resolved, mild SOB (chronic)   General: Fatigue   Skin: No Complaints   ENT: No Complaints   Eyes: No Complaints   Neck: No Complaints   Respiratory: Short of breath  on home O2, chronic SOB,   Cardiovascular: Dyspnea   Gastrointestinal: No Complaints  Nausea   Genitourinary: No Complaints   Vascular: No Complaints   Musculoskeletal: No Complaints   Neurologic: No Complaints   Hematologic: No Complaints   Endocrine: No Complaints   Psychiatric: No Complaints   Review of Systems: All other systems were reviewed and found to be negative   Medications/Allergies Reviewed Medications/Allergies reviewed   Family & Social History:  Family and Social History:  Family History Coronary Artery Disease  mother died with MI in her early 66's.  Father died of cancer in his 33's.   Social History positive tobacco (Greater than 1 year), smoked ~ 1/3-1/2 ppd  x 50+ yrs, quit 2014. No etoh/drugs.   Place of Living Home  Lives in [place] with wife, dtr, son-in-law.     Remote Tobacco Abuse: a. 1/3 - 1/2 ppd x 50+ yrs.  Quit 2014.   AAA: a. 09/2013 s/p endovascular stenting.   COPD with recurrent Bronchitis: a. On Home O2.   Orthostatic Hypotension and Syncope: a. On florinef;  b. 09/2013 Echo: EF 50-55%.   Permanent Afib: a. CHA2DS2VASc = 7, On eliquis.   Chronic Diastolic CHF: a. 01/9677 Echo: EF 50-55%, mild LVH, low nl RV fxn, mild to mod MR/TR.   Carotid Arterial Dzs: a. 2002 s/p bilat CEA.   TIA/CVA: a. 2002 s/p bilat CEA.   CAD: a. s/p CABG  2007;  b. s/p prior stenting including LM stenting;  c. 06/2013 Myoview: Lat ischemia;  d.  06/2013 Cath: LM stent patent, LIMA->LAD ok, VG->OM3 ok, VG->Diag known to be occluded.   afib:    Nocturnal hypoxia on noct 02:    Anemia, Chronic:    CVA/Stroke:    MI - Myocardial Infarct:    prostate cancer with brachytherapy:    peripheral vascular disease:    HTN:    CAD:    COPD:    Bronchitis:    High cholesterol:    angina:    AAA repair:    CABG (Coronary Artery Bypass Graft):    lasik surgery both eyes:    4 cardiac stents:    Carotid Endarterectomy:   Home Medications: Medication Instructions Status  furosemide 20 mg oral tablet 1 tab(s) orally once a day Active  Klor-Con M20 oral tablet, extended release 1 tab(s) orally once a day, As Needed when taking lasix Active  Symbicort 160 mcg-4.5 mcg/inh inhalation aerosol 2 puff(s) inhaled 2 times a day Active  Eligard 45 mg/6 months subcutaneous injection, extended release 45 milligram(s) subcutaneous every 6 months on the first of April and October Active  Nitrostat 0.4 mg sublingual tablet 1 tab(s) sublingual every 5 minutes up to 3 doses as needed for chest pain. *if no relief call md or go to emergency room* Active  fludrocortisone 0.1 mg oral tablet 1 tab(s) orally once a day (in the morning) take only 3 times a week for 5 pounds or more weight gain/ swelling. If no relief, take furosemide with potassium.Marland Kitchen Active  Crestor 20 mg oral tablet 1 tab(s) orally once a day (in the evening) Active  Fish Oil 1000 mg oral capsule 1 cap(s) orally 2 times a day Active  docusate sodium 100 mg oral tablet 1 tab(s) orally 2 times a day Active  Ventolin HFA 90 mcg/inh inhalation aerosol 2 puff(s) inhaled every 4 hours, As Needed - for Shortness of Breath Active  Vitamin C 1000 mg oral tablet 1 tab(s) orally once a day (in the morning) Active  Spiriva 18 mcg inhalation capsule 1 caps via handihaler once a day (in the morning)  Active  Senna 8.6 mg oral tablet 1 tab(s) orally once a day (in the morning) Active  Mucinex 600 mg oral tablet, extended release 1 tab(s) orally 2 times a day, As Needed for cough/congestion Active  sertraline 50 mg oral tablet 2 tab(s) orally once a day (in the morning) Active  albuterol-ipratropium 1 vial(s) inhaled 4 times a day, As Needed Active  cefuroxime 500 mg oral tablet 1 tab(s) orally 2 times a day Active  pantoprazole 40 mg oral delayed release tablet 1 tab(s) orally 2 times a day Active  midodrine 5  mg oral tablet 1 tab(s) orally 3 times a day for low blood pressure. *blood pressure under 100=no lasix* Active  bisoprolol 5 mg oral tablet 1 tab(s) orally 2 times a day Active  nystatin 100000 units/mL oral suspension swish and swallow 5 milliliter(s) orally 2 times a day Active  Hydromet 1.5 mg-5 mg/5 mL oral syrup 1 teaspoonsful (5 milliliters) orally every 6 hours as needed for pain from coughing Active  Robitussin DM To Go 20 mg-200 mg/10 mL oral liquid 10 milliliter(s) orally every 4 hours, As Needed for cough Active  ferrous sulfate 325 mg oral tablet 1 tab(s) orally 2 times a day Active  Aspirin Enteric Coated 81 mg oral delayed release tablet 1 tab(s) orally once a day (in the morning) Active  Eliquis 5 mg oral tablet 1 tab(s) orally 2 times a day Active  ALPRAZolam 0.5 mg oral tablet 2 tabs (1mg ) orally once, then 1 tab orally every 15 minutes for 1 hour only as needed for anxiety. Active  Zantac 150 150 mg oral tablet 1 tab(s) orally once a day, As Needed - for Indigestion, Heartburn Active   EKG:  EKG Interp. by me   Interpretation EKG shows afib, poor r prog, old inf infarct.   Radiology Results: XRay:    19-Jul-15 16:11, Chest Portable Single View  Chest Portable Single View   REASON FOR EXAM:    chest pain  COMMENTS:       PROCEDURE: DXR - DXR PORTABLE CHEST SINGLE VIEW  - Mar 06 2014  4:11PM     CLINICAL DATA:  Chest pain.    EXAM:  PORTABLE CHEST - 1  VIEW    COMPARISON:  February 24, 2014.    FINDINGS:  Stable cardiomegaly is noted. Status post coronary artery bypass  graft. Mild bilateral perihilar and basilar interstitial densities  are noted concerning for pulmonary edema, which are worse on the  left than the right. Minimal left pleural effusion is noted. No  pneumothorax is noted.     IMPRESSION:  Cardiomegaly with mild bilateral perihilar and basilar interstitial  densities are noted concerning for pulmonary edema.      Electronically Signed    By: Sabino Dick M.D.    On: 03/06/2014 16:16         Verified By: Marveen Reeks, M.D.,    No Known Allergies:   Vital Signs/Nurse's Notes: **Vital Signs.:   20-Jul-15 05:04  Temperature Temperature (F) 98  Celsius 36.6  Temperature Source oral  Pulse Pulse 75  Respirations Respirations 18  Systolic BP Systolic BP 382  Diastolic BP (mmHg) Diastolic BP (mmHg) 94  Mean BP 110  Pulse Ox % Pulse Ox % 100  Pulse Ox Activity Level  At rest  Oxygen Delivery 3L    Impression 1) Chest pain presenting with chest pain. 8/10 initially Several recent admission for chest pain. Last ischemia workup in 11/14 with myoview and cath This admission, No changes on EKG, cardiac enz negative Currently pain free --Discussed options with him. Will avoid cath as enz negative. Could consider repeat stress test in AM (lexiscan). If he looks good and if patient choses, could do as an outpt but he is weak and may have difficulty getting back and forth --If stress unchanged from 11/14, would add ranexa 1000 mg po BID  2. Syncope/Orthostatic hypotn:   No recent syncope  finished 30 even monitor, no significant arrhythmia, though had no syncope with monitor in place outpt follow up with Dr.  Caryl Comes COntinue midodrine 5 TID for BP support  3.  CAD: recent admission for chest pain This was pleuritic, musculoskeletal in nature  further workup as above as he continue to present to the  hospital  4) Chronic diastolic CHF BNP lower than last week, creatinine BUN mildly elevated consistent with adequate diuresis Do not this he has CHF exacerbation as numbers the same or better than last week.  Possible COPD exacerbation if SOB. May need steroids if SOB.   5.  HTN:   Stable on current meds. Will avoid hypotension given hx of syncope  6.  HL:   On statin.  7.  COPD:   O2 dependent.  chronic mild SOB would monitor sx with diuresis  8.  PVD:  S/P bilat CEA.  Stable findings on recent carotid u/s  9.  AAA:  S/P endovascular stent grafting in Feb.  Stable CTA on 5/18.  10. Afib:   Rate-controlled and asymptomatic.  Anticoagulated with Eliquis. h/o TIAs and CVA off anticoagulation  11. dispo: frequent admissions for numerous, different issues each admission. Need to consider nursing home   Electronic Signatures: Ida Rogue (MD)  (Signed 20-Jul-15 09:56)  Authored: General Aspect/Present Illness, History and Physical Exam, Review of System, Family & Social History, Past Medical History, Home Medications, EKG , Radiology, Allergies, Vital Signs/Nurse's Notes, Impression/Plan   Last Updated: 20-Jul-15 09:56 by Ida Rogue (MD)

## 2014-12-10 NOTE — Discharge Summary (Signed)
PATIENT NAME:  TRACY, KINNER MR#:  [id number] DATE OF BIRTH:  1936/06/28  DATE OF ADMISSION:  01/07/2014  DATE OF DISCHARGE:  01/07/2014  ADMITTING DIAGNOSIS: Recurrent syncope.   DISCHARGE DIAGNOSES: 1. Recurrent syncope of unclear etiology at this time, suspected vasovagal versus orthostatic hypotension related. 2.  Chronic abdominal pain.  3.  Anemia.  4.  Right pleural effusion, likely chronic diastolic CHF. No history of coronary artery disease,  obstructive sleep apnea, peripheral vascular disease, chronic diastolic CHF, hypertension, TIA, CVA, chronic AFib, on chronic anticoagulation with Eliquis, chronic respiratory failure, on 2 liters of oxygen through nasal cannula at baseline.   DISCHARGE CONDITION: Stable.   DISCHARGE MEDICATIONS: The patient is to resume  1.  Symbicort 160/4.5, 2 puffs twice daily. 2.  Iron sulfate 325 mg twice daily.   3.  Eligard 45 mg subcutaneous every 6 months.  4.  Montelukast 10 mg once at bedtime. 5.  Ventolin HFA 2 puffs every 4 hours as needed.  6.  Nitrostat 0.4 mg sublingually every 5 minutes as needed.  7.  Fludrocortisone 0.1 mg daily.  8.  Zoloft 50 mg p.o. daily.  9.  Crestor 20 mg p.o. in the evening.  10.  Fish oil 1 gram twice daily.  11.  Docusate sodium 100 mg p.o. twice daily.  12.  Aspirin 81 mg p.o. daily.  13.  Vitamin C 1000 mg once daily.  14.  Spiriva 18 mcg inhalation daily.  15.  Senna 8.6 mg p.o. daily.  16.  Mucinex 600 mg twice daily as needed.  17.  Codeine/guaifenesin 10/100 in 5 mL oral solution syrup, 10 mL every 4 hours as needed.  18.  Acetaminophen/hydrocodone 355/5 mg 1 tablet every 4 hours as needed.  19.  Eliquis 5 mg twice daily.  20.  Prilosec 10 mg p.o. 2 tablets daily.  21.  Bisoprolol 5 mg p.o. twice daily.  22.  Furosemide 40 mg p.o. daily.  23.  Klor-Con 20 mEq once daily.  24.  DuoNeb 0.5 mg/2.5 mg in 3 mL inhalation solution every 4 hours as needed.  25.  Alprazolam 0.5 mg 3 times daily.  26.   New medication lisinopril 2.5 mg once daily.   The patient may need to have his bisoprolol dose decreased as well, since he is initiated on lisinopril. Also, watch patient's potassium levels as well as patient's home health, Physical Therapy and nurse. The patient's physical therapist and nurse are required to monitor patient's blood pressure, especially orthostatic vital signs. Also educating patient about the ways to counteract this orthostatic hypotension problem. Home oxygen with portable tank at 2 liters of oxygen through nasal cannula.   DIET: 2 grams salt, low fat, low cholesterol, regular consistency.   ACTIVITY LIMITATIONS: As tolerated. The patient was advised to be slow to stand and watching his symptoms very closely. A referral to home health physical therapist as well as nurse.   FOLLOWUP APPOINTMENT: With Dr. Leonides Sake in [facility] in the next 2 or 3 days after discharge. The patient was also advised to have potassium level checked in the next 2 or 3 days after discharge, as he is now on potassium supplements as well as lisinopril. Most of them may increase potassium levels.    CONSULTANTS: Care Management, Social Work, Dr. Acie Fredrickson, Cardiology.  RADIOLOGIC STUDIES: CT scan of cervical spine without contrast as well as CT of head without contrast 01/06/2014 showed no acute abnormality, atrophy and chronic small vessel white matter ischemic  changes were noted, as well as mild cervical spine degenerative changes. Carotid ultrasound 01/07/2014 showed less than 50% stenosis of the right and left internal carotid arteries. Irregular rhythm, consider atrial fibrillation.  REASON FOR ADMISSION:  The patient is a 79 year old Caucasian male with past medical history significant for history of syncopal episodes in the past, who presents to the hospital with complaints of syncope again. Apparently patient passed out, non-witnessed syncope. He was working at home. He had no presyncopal  warning symptoms. His family heard him hit the floor and called EMS. He regained consciousness in approximately 5 minutes. No chest pain, shortness of breath, nausea, vomiting, diaphoresis, lightheadedness or dizziness were noted. He was taken to [facility], and his CT scan was checked of his head and neck, which was unremarkable. His EKG showed AFib, which is chronic, with no acute changes. Labs were unremarkable. Telemetry revealed atrial fibrillation with no pauses or ectopy.  LAB DATA:  On arrival to the hospital was unremarkable, with mild elevation of CO2 level of 34. His cardiac enzymes x 3 were within normal limits. The patient's CBC was unremarkable. White blood cell count of 9.2, hemoglobin 12.2, platelet count 160. Absolute neutrophil count was 7.3. Urinalysis was remarkable for 3 red blood cells, less than 1 white blood cell, negative for leukocyte esterase, nitrites, proteins, or blood. Negative for bacteria or epithelial cells.   HOSPITAL COURSE:  The patient was admitted to the hospital for further evaluation. His cardiac enzymes were cycled, and a Cardiology consultation with Dr. Acie Fredrickson was obtained. The patient's cardiologist, Dr. Mertie Moores, saw the patient in consultation the same day, 01/07/2014. He felt that the patient's syncope very likely was related to orthostatic hypotension. Initially episodes which occurred earlier in the month according to when patient was sitting, and his blood pressure was noted be low prior to episode. Yesterday, however, he was walking in his home and post-syncope, he was somewhat groggy and had no symptoms. The cardiologist felt that patient's ongoing orthostatic hypotension remains a strong possibility for the course of his syncope. The patient has never been shown to have significant arrhythmia during the event. Apparently he has worn  outpatient monitor in the past. He did not have any episodes of syncope. According to Dr. Acie Fredrickson,  they could arrange an outpatient event monitor if he fails to have recurrent syncope while wearing it. Consider referral to [facility] lab for implantable loop recorded. In the meantime, recommended to continue his home dose of Florinef and follow up with Dr. Rockey Situ as outpatient. Apparently Dr. Rockey Situ wanted these resting blood pressure to be in the 150s range.   In regards to chronic diastolic congestive heart failure, according to Dr. Acie Fredrickson, the patient's volume looked good. He recommended to continue his home doses of Lasix, and continue beta blocker as well as low dose of ACE inhibitor.  In regards to coronary artery disease, there was no chest pain. He was recommended to continue aspirin, beta blocker as well as ACE inhibitor and statin.  For hypertension, he felt it was stable on current medications.   For her hyperlipidemia, the patient is to continue statin. For history of COPD, he recommended to continue oxygen therapy. However, according to patient, he had no dyspnea symptoms prior to syncopal spells, making hypoxia less likely to be primary cause of his obvious syncopal episodes.   In regards to cardiovascular disease, status post bilateral CEA. The patient had carotid ultrasound today, on the day of admission, 01/07/2014, and  it was unremarkable.  In regards to AAA,  the patient did have endovascular stent grafting in February 2015, and the patient had CTA repeated on 01/03/2014, and that was also unremarkable.  For atrial fibrillation, recommended to continue anticoagulation with Eliquis, and rate control was beta blocker. Dr. Acie Fredrickson felt that patient needs likely to have an event monitor or implantable loop recorder for those episodes to delineate a little bit better about those results.    Time spent:  40 minutes.   ____________________________ Theodoro Grist, MD rv:mr D: 01/07/2014 14:00:00 ET T: 01/07/2014 21:36:27 ET JOB#: [id number]  cc: Theodoro Grist, MD, <Dictator>  Thelma Viana MD ELECTRONICALLY SIGNED 01/30/2014 20:40

## 2014-12-10 NOTE — Consult Note (Signed)
General Aspect 79 y/o male with a h/o CAD s/p CABG, severe COPD and chronic bronchitis, chronic orthostasis, syncope, chronic afib on eliquis, and PAD, recent admission for chest pain presenting with SOB. Cardiology was consulted for SOB, possible acute on chronic diastolic CHF.   Present Illness . 79 y/o male with a h/o CAD s/p CABG in 2007.  His last cath was in 06/2013, revealing patent LM stent with 2/3 patent grafts (known occlusion to the VG->Diag).  He also has a h/o chronic diast chf (EF 50-55% 09/2013), afib (on eliquis), Rem tobacco abuse (1/2 ppd x 50 yrs, quit 2014) with O2 dependent COPD and chronic bronchitis, AAA s/p endovascular repair 09/2013, HTN, TIA s/p bilat CEA (2002), and a long h/o orthostatic hypotension with periodic syncope, on florinef therapy.  Admitted in 01/07/14 with syncope, turned in 30 day even monitor. No arrhythmia noted, though he had no syncope.    admitted to [facility] in March 2015 with for diast chf exacerbation.  He was effectively diuresed and since that discharge, he has done reasonably well.    His weight has been stable and he has been adjusting his home lasix dose accordingly.    He reports having increased cough, congestion, SOB. He denies any weight gain, no edema or ABD swelling. He was  brought to the ER by his family. He continues to have problems with anxiety.   In the ER, he was started on lasix IV. Still reports continued cough and SOB   Physical Exam:  GEN well developed, pleasant, nad.   HEENT hearing intact to voice, moist oral mucosa   NECK supple  No masses  no bruits/jvd.   RESP normal resp effort  clear BS  crackles at the left base 1/2 up   CARD Irregular rate and rhythm  Normal, S1, S2  No murmur   ABD denies tenderness  soft  normal BS   LYMPH negative neck   EXTR negative cyanosis/clubbing, negative edema   SKIN normal to palpation   NEURO grossly intact, nonfocal.   PSYCH alert, A+O to time, place, person, good insight    Review of Systems:  Subjective/Chief Complaint Tired, SOB, cough   General: Fatigue  Weakness   Skin: No Complaints   ENT: No Complaints   Eyes: No Complaints   Neck: No Complaints   Respiratory: Frequent cough  Short of breath  on home O2, chronic SOB, mild cough   Cardiovascular: Dyspnea   Gastrointestinal: No Complaints  Nausea   Genitourinary: No Complaints   Vascular: No Complaints   Musculoskeletal: No Complaints   Neurologic: No Complaints   Hematologic: No Complaints   Endocrine: No Complaints   Psychiatric: No Complaints   Review of Systems: All other systems were reviewed and found to be negative   Medications/Allergies Reviewed Medications/Allergies reviewed   Family & Social History:  Family and Social History:  Family History mother died with MI in her early 81's.  Father died of cancer in his 77's.   Social History smoked ~ 1/3-1/2 ppd x 50+ yrs, quit 2014. No etoh/drugs.   Place of Living Home  Lives in [place] with wife, dtr, son-in-law.     Remote Tobacco Abuse: a. 1/3 - 1/2 ppd x 50+ yrs.  Quit 2014.   AAA: a. 09/2013 s/p endovascular stenting.   COPD with recurrent Bronchitis: a. On Home O2.   Orthostatic Hypotension and Syncope: a. On florinef;  b. 09/2013 Echo: EF 50-55%.   Permanent Afib: a. CHA2DS2VASc =  7, On eliquis.   Chronic Diastolic CHF: a. 09/2977 Echo: EF 50-55%, mild LVH, low nl RV fxn, mild to mod MR/TR.   Carotid Arterial Dzs: a. 2002 s/p bilat CEA.   TIA/CVA: a. 2002 s/p bilat CEA.   CAD: a. s/p CABG 2007;  b. s/p prior stenting including LM stenting;  c. 06/2013 Myoview: Lat ischemia;  d.  06/2013 Cath: LM stent patent, LIMA->LAD ok, VG->OM3 ok, VG->Diag known to be occluded.   afib:    Nocturnal hypoxia on noct 02:    Anemia, Chronic:    CVA/Stroke:    MI - Myocardial Infarct:    prostate cancer with brachytherapy:    peripheral vascular disease:    HTN:    CAD:    COPD:    Bronchitis:    High  cholesterol:    angina:    AAA repair:    CABG (Coronary Artery Bypass Graft):    lasik surgery both eyes:    4 cardiac stents:    Carotid Endarterectomy:        Admit Reason:   CHF (congestive heart failure) (428.0): Onset Date: 22-Feb-2014, Status: Active, Coding System: ICD9, Coded Name: Congestive heart failure, unspecified  Home Medications: Medication Instructions Status  traMADol 50 mg oral tablet 1 tab(s) orally 2 times a day Active  isosorbide mononitrate 30 mg oral tablet, extended release 1 tab(s) orally once a day Active  Klor-Con M20 oral tablet, extended release 1 tab(s) orally once a day, As Needed when taking lasix Active  Symbicort 160 mcg-4.5 mcg/inh inhalation aerosol 2 puff(s) inhaled 2 times a day Active  Eligard 45 mg/6 months subcutaneous injection, extended release 45 milligram(s) subcutaneous every 6 months on the first of April and October Active  Nitrostat 0.4 mg sublingual tablet 1 tab(s) sublingual every 5 minutes up to 3 doses as needed for chest pain. *if no relief call md or go to emergency room* Active  fludrocortisone 0.1 mg oral tablet 1 tab(s) orally once a day (in the morning) take only 3 times a week for 5 pounds or more weight gain/ swelling. If no relief, take furosemide with potassium.Marland Kitchen Active  Crestor 20 mg oral tablet 1 tab(s) orally once a day (in the evening) Active  Fish Oil 1000 mg oral capsule 1 cap(s) orally 2 times a day Active  docusate sodium 100 mg oral tablet 1 tab(s) orally 2 times a day Active  Ventolin HFA 90 mcg/inh inhalation aerosol 2 puff(s) inhaled every 4 hours, As Needed - for Shortness of Breath Active  Vitamin C 1000 mg oral tablet 1 tab(s) orally once a day (in the morning) Active  Spiriva 18 mcg inhalation capsule 1 caps via handihaler once a day (in the morning) Active  Senna 8.6 mg oral tablet 1 tab(s) orally once a day (in the morning) Active  Mucinex 600 mg oral tablet, extended release 1 tab(s) orally 2 times a  day, As Needed for cough/congestion Active  pantoprazole 40 mg oral delayed release tablet 1 tab(s) orally 2 times a day Active  midodrine 5 mg oral tablet 1 tab(s) orally 3 times a day for low blood pressure. *blood pressure under 100=no lasix* Active  bisoprolol 5 mg oral tablet 1 tab(s) orally 2 times a day Active  nystatin 100000 units/mL oral suspension swish and swallow 5 milliliter(s) orally 2 times a day Active  Hydromet 1.5 mg-5 mg/5 mL oral syrup 1 teaspoonsful (5 milliliters) orally every 6 hours as needed for pain from coughing Active  Robitussin DM To Go 20 mg-200 mg/10 mL oral liquid 10 milliliter(s) orally every 4 hours, As Needed for cough Active  ferrous sulfate 325 mg oral tablet 1 tab(s) orally 2 times a day Active  Aspirin Enteric Coated 81 mg oral delayed release tablet 1 tab(s) orally once a day (in the morning) Active  sertraline 50 mg oral tablet 1 tab(s) orally once a day (in the morning) Active  furosemide 20 mg oral tablet 1 tab(s) orally every other day for weight less than 215 lbs, take 1 tab orally once a day for weight 215-to 218 lbs, take 2 tabs (40mg ) orally once a day for weight greater than 219 to 222 lbs, 1 tab orally 2 times a day for weight greater than 223 lbs. Active  Eliquis 5 mg oral tablet 1 tab(s) orally 2 times a day Active  ALPRAZolam 0.5 mg oral tablet 2 tabs (1mg ) orally once, then 1 tab orally every 15 minutes for 1 hour only as needed for anxiety. Active  albuterol 2.5 mg/3 mL (0.083%) inhalation solution 1 vial (3 milliliters) via nebulizer 4 times a day as needed for shortness of breath/wheezing  Active  Zantac 150 150 mg oral tablet 1 tab(s) orally once a day, As Needed - for Indigestion, Heartburn Active   EKG:  EKG Interp. by me   Interpretation EKG shows afib, poor r prog, old inf infarct.   Radiology Results: XRay:    07-Jul-15 09:01, Chest Portable Single View  Chest Portable Single View   REASON FOR EXAM:    SHORTNESS OF  BREATH  COMMENTS:       PROCEDURE: DXR - DXR PORTABLE CHEST SINGLE VIEW  - Feb 22 2014  9:01AM     CLINICAL DATA:  Short of breath    EXAM:  PORTABLE CHEST - 1 VIEW    COMPARISON:  02/13/2014    FINDINGS:  Cardiomegaly. Pulmonary edema and vascular congestion are worse.  Bilateral basilar opacity likely a combination of volume loss and  pleural fluid are worse. No pneumothorax.     IMPRESSION:  Worsening CHF.      Electronically Signed    By: Maryclare Bean M.D.    On:02/22/2014 09:04         Verified By: Jamas Lav, M.D.,    No Known Allergies:   Vital Signs/Nurse's Notes: **Vital Signs.:   07-Jul-15 19:38  Vital Signs Type Routine  Temperature Temperature (F) 98.4  Celsius 36.8  Temperature Source oral  Pulse Pulse 85  Respirations Respirations 20  Systolic BP Systolic BP 409  Diastolic BP (mmHg) Diastolic BP (mmHg) 81  Mean BP 93  Pulse Ox % Pulse Ox % 93  Pulse Ox Activity Level  At rest  Oxygen Delivery 2L    Impression 1. SOB, respiratory distress  possible acute on Chronic diastolic CHF:   Unable to exclude early bronchitis No recent weight gain, following a rigid weight/lasix regimen, no edema or ABD swelling New cough, consistent with prior bronchitis epsiodes No recent admits for CHF -Would continue lasix IV BID with change back to po lasix for a rise in his creatinine --If SOB persists with a rise in creatinine, would consider starting ABX for possible bronchitis (given COPD, in teh past he has required a long coarse of ABX to prevent readmission, 14 days)  2. Syncope/Orthostatic hypotn:   recently finished 30 even monitor no significant arrhythmia, though had no syncope Was schedule to see Dr. Caryl Comes as an outpt for syncoep  and for inplantable loop monitor today (cancelled) Would continue florinef as he has significant orthostatic hypotension without florinef.  3.  CAD: recent admission for chest pain pleuritic, musculoskeletal in nature last  admission. No further workup  4.  HTN:   Stable on current meds.  5.  HL:   On statin.  6.  COPD:   O2 dependent.   chronic cough, now with exacerbation, possible bronchitis. would monitor sx with diuresis  7.  PVD:  S/P bilat CEA.  Stable findings on recent carotid u/s  8.  AAA:  S/P endovascular stent grafting in Feb.  Stable CTA on 5/18.  9. Afib:   Rate-controlled and asymptomatic.  Anticoagulated with Eliquis. h/o TIAs and CVA off anticoagulation  10. dispo: frequent admissions for numerous, different issues each admission. Need to consider nursing home   Electronic Signatures: Ida Rogue (MD)  (Signed 07-Jul-15 21:50)  Authored: General Aspect/Present Illness, History and Physical Exam, Review of System, Family & Social History, Past Medical History, Health Issues, Home Medications, EKG , Radiology, Allergies, Vital Signs/Nurse's Notes, Impression/Plan   Last Updated: 07-Jul-15 21:50 by Ida Rogue (MD)

## 2014-12-10 NOTE — H&P (Signed)
PATIENT NAME:  Javier [name], Javier [name] MR#:  [id number] DATE OF BIRTH:  1936-01-17  DATE OF ADMISSION:  03/29/2014  PRIMARY CARE PROVIDER: Dr. Francee Nodal.   CARDIOLOGY: Dr. Rockey Situ.   CHIEF COMPLAINT: Confusion, left-sided weakness, and slurred speech.   HISTORY OF PRESENT ILLNESS: A 79 year old Caucasian male patient with multiple comorbidities including congestive heart failure, CAD, hypertension, multiple TIAs presents to the Emergency Room, brought in by family after they noticed that patient, on waking up from sleep, was extremely confused, unable to recognize them, had some slurred speech, and also worse left-sided weakness.   Patient supposedly tends to have on-and-off episodes of confusion with lots of thoughts, maybe lasting a few seconds, but this lasted a couple of hours until he presented to the Emergency Room today. He also had some worse left weakness. The patient, from prior stroke, had residual left weakness, which is worse today.  Presently, the patient feels back to baseline, but considering his risk factors, he is being admitted for a TIA work-up to the hospital under observation.   The patient has had significant weakness working with physical therapy at home.   PAST MEDICAL HISTORY:  1.  Chronic obstructive pulmonary disease and chronic respiratory failure on 2 liters oxygen.  2.  CAD.  3.  Chronic diastolic CHF.  4.  Peripheral vascular disease.  5.  Obstructive sleep apnea.  6.  Hypertension. 7.  TIAs.  8.  CVA, status post TPA 1 year prior.  9.  Prostate cancer.  10.  Chronic atrial fibrillation.  11.  Chronic anticoagulation.   PAST SURGICAL HISTORY:  1.  LASIK.  2.  Prostate surgery. 3.  Cardiac stent. 4.  CABG.  5.  Carotid endarterectomy.   ALLERGIES: NO KNOWN DRUG ALLERGIES.   FAMILY HISTORY:  throat cancer in his father. No other cancers in the family.  MI in his mother.   SOCIAL HISTORY: The patient smoked in the past, but quit in 2014. Does not  drink any alcohol. No illicit drugs. Lives at home.  He and his wife stayed with his daughter and son-in-law. He ambulates with a cane at home, has been progressively getting weak over the past few weeks.   REVIEW OF SYSTEMS: CONSTITUTIONAL: He complains of generalized weakness and fatigue.  EYES: No blurred vision, pain or redness.  EAR, NOSE, AND THROAT: No tinnitus, ear pain, hearing loss.  RESPIRATORY: Has some chronic dry cough. Also, chronic obstructive pulmonary disease and chronic respiratory failure.  CARDIOVASCULAR: No chest pain, orthopnea, edema.  GASTROINTESTINAL: No nausea, vomiting, diarrhea, abdominal pain. GENITOURINARY: No history of hematuria or frequency.  ENDOCRINE: No polyuria, nocturia or thyroid problems.  HEMATOLOGIC AND LYMPHATIC: No anemia, easy bruising or bleeding.  INTEGUMENTARY:  No acne, rash, lesion.  MUSCULOSKELETAL: Has some arthritis.  NEUROLOGIC: Has a chronic left-sided weakness from his prior stroke. History of CVA and TIA.  PSYCHIATRIC: Has depression.   HOME MEDICATIONS:  1.  DuoNeb inhaled 4 times a day as needed.  2.  Alprazolam 0.5 two tablets oral every 6 hours as needed for anxiety.  3.  Aspirin 81 mg daily.  4.  Bisoprolol 5 mg oral 2 times a day.  5.  Crestor 20 mg daily.  6.  Docusate sodium 100 mg 2 times a day.  7.  Eligard 45 mg subcutaneous every 6 months. 8.  Eliquis 5 mg oral 2 times a day.  9.  Ferrous sulfate 325 mg oral 2 times a day.  10.  Fish oil  1000 mg oral 2 times a day.  11.  Fludrocortisone 0.1 mg oral once a day in the morning, 3 times a week.  12.  Lasix 20 mg oral once a day.  13.  Imdur 30 mg oral once a day.  14.  Potassium chloride 20 mEq oral once a day when taking Lasix.  15.  Midodrine 5 mg oral 3 times a day for low blood pressure as needed.  16.  Mucinex 600 mg oral 2 times a day as needed for cough.  17.  Nitrostat 0.4 sublingual as needed for chest pain.  18.  Protonix 40 mg oral 3 times a day.  19.   Robitussin-DM 10 mL every 4 hours as needed for cough.  20.  Senna 8.6 mg oral once a day.  21.  Sertraline 50 mg 2 tablets once a day.  22.  Spiriva 18 mcg inhaled once a day.  23.  Symbicort 160/4.5 mg 2 puffs inhaled 2 times a day.  24.  Ventolin HFA 2 puffs inhaled 4 hours as needed.  25.  Zantac 150 mg oral once a day as needed for indigestion.   PHYSICAL EXAMINATION:  VITAL SIGNS: Temperature 98.5, pulse of 80, respirations 18, blood pressure 142/85, saturating 98% on 2 liters oxygen.  GENERAL: Obese, Caucasian male patient lying in bed, seems comfortable, conversational.  PSYCHIATRIC: Alert and oriented x 3. Mood and affect appropriate. Judgment intact. Pleasant.  HEENT: Atraumatic, normocephalic.  Oral mucosa moist and pink. External ears normal. Pallor positive. No icterus. Pupils bilaterally equal and reactive to light. No slurred speech. No facial droop.  NECK: Supple. No thyromegaly or palpable lymph nodes. Trachea midline. No carotid bruit, JVD.  CARDIOVASCULAR: S1, S2, no murmurs, irregular. Peripheral pulses 2+. Has 1+ edema.  RESPIRATORY: Normal work of breathing. Clear to auscultation on both sides.  GASTROINTESTINAL: Soft abdomen, nontender. Bowel sounds present. No hepatosplenomegaly palpable.  GENITOURINARY: No significant bladder distention.  SKIN: Warm and dry. No petechiae or rash.  MUSCULOSKELETAL: No joint swelling, redness in large joints. Normal muscle tone.  NEUROLOGICAL: Motor strength 5/5 in right upper and lower extremities, 4+/5 in left upper and lower extremities. Reflexes 1+. Sensation to fine touch intact all over. Cranial nerves II-XII intact. Gait not tested.  LYMPHATIC: No cervical lymphadenopathy.   LABORATORY STUDIES:  Glucose of 97, BUN 3000, BUN 19, creatinine 1.38, sodium 140, potassium 3.7, with AST, ALT, alkaline phosphatase, bilirubin normal. Troponin less than 0.02. WBC 8.6, hemoglobin 9.7, platelets of 167,000, INR 1.3.   Urinalysis shows no  bacteria, no WBC.   ABG shows pH of 7.4 with pCO2 41, pO2 of 107.   EKG shows atrial fibrillation. No acute ST-T wave changes.   CT scan of the head without contrast shows atrophy with prior infarcts, nothing acute.   Chest x-ray shows cardiomegaly, small persistent bilateral pleural effusions, improved vascular congestion.   ASSESSMENT AND PLAN:  1.  Acute confusion, slurred speech, and worsening left-sided weakness in a patient with atrial fibrillation, prior endarterectomy, and history of strokes. This is likely transient ischemic attack. The patient has had multiple episodes of transient ischemic attacks in the past. Presently, he is back to baseline. We will admit the patient onto a telemetry floor, get echo and MRI. The patient had a carotid Doppler earlier this year, will not repeat this. He had less than 50% stenosis on that Doppler. The patient will be on aspirin and statin. He continues to be on Eliquis for his atrial fibrillation. We  will consult neurology for further input with the case. The patient will get neurologic checks q. 4 hours and fall precautions.  2.  Hypertension. Continue home medications.  3.  Chronic atrial fibrillation on Eliquis. Continue rate control medications along with Eliquis.  4.  Chronic diastolic congestive heart failure. No crackles on examination and no signs of fluid overload. No pulmonary edema on the chest x-ray. Continue home dose of Lasix and monitor.  5.  Deep vein thrombosis prophylaxis. The patient is on Eliquis.   CODE STATUS: FULL CODE.   TIME SPENT TODAY ON THIS CASE: 40 minutes.    ____________________________ Javier Alf Jaslene Marsteller, MD srs:TT D: 03/29/2014 17:39:46 ET T: 03/29/2014 19:44:41 ET JOB#: [id number]  cc: Alveta Heimlich R. Jakeria Caissie, MD, <Dictator> Francee Nodal, MD Neita Carp MD ELECTRONICALLY SIGNED 04/04/2014 14:05

## 2014-12-10 NOTE — Consult Note (Signed)
General Aspect 79 y/o male with a h/o CAD s/p CABG, severe COPD and chronic bronchitis, chronic orthostasis, syncope, chronic afib on eliquis, and PAD, recent admissions for chest pain and  SOB. Cardiology was consulted for chest pain.   Present Illness . 79 y/o male with a h/o CAD s/p CABG in 2007.  His last cath was in 06/2013, revealing patent LM stent with 2/3 patent grafts (known occlusion to the VG->Diag).  He also has a h/o chronic diast chf (EF 50-55% 09/2013), afib (on eliquis), Rem tobacco abuse (1/2 ppd x 50 yrs, quit 2014) with O2 dependent COPD and chronic bronchitis, AAA s/p endovascular repair 09/2013, HTN, TIA s/p bilat CEA (2002), and a long h/o orthostatic hypotension with periodic syncope.   He presents with chest pain. He was bending over to give the dogs a treat, and as he stood up he developed CP. Sx then spread to his other joints ("all over, like before the prednisone"). Pain control in the ER and on the floor with morphine. Prednisone up from 10 to 20 mg daily here. Sed rate pending. Currently 4/10 pain, appears comfortable. He has not seen rheumatology Recently seen in clinic on prednisone 20 mg daily and was doing well.   recent hospitalization approximately one month ago for bronchitis, diastolic CHF. Improved with antibiotics and diuresis .  Recently seen in clinic 1 weeks ago,  reports having recent diagnosis of polymyalgia rheumatica. Sedimentation rate was elevated. He is currently on a prednisone taper and was feeling much better . Prior to starting prednisone, he had severe diffuse arthritis . He states that he has followup with neurology at the beginning of October 2015 for EEG given prior history of syncope Weight was stable at 208 up to 210 pounds  Hospital admission in July for shortness of breath improved with diuresis.   Prior hospital admission March 7858 for diastolic CHF exacerbation.  in the hospital July 20 for chest pain. Cardiac enzymes negative x3, had  been working with physical therapy with no symptoms. Chest pain came on at rest. Stress test showed ejection fraction 46%, fixed defect in the inferolateral region, previous scar. He was started on isosorbide He was discharged to [facility] but after 3 days, asked his wife to pick him up as he was having terrible service and care per the patient.   Admitted in 01/07/14 with syncope, turned in 30 day even monitor. No arrhythmia noted, though he had no syncope.    admitted to [facility] in March 2015 with for diast chf exacerbation.  He was effectively diuresed and since that discharge, he has done reasonably well.    He continues to have problems with anxiety.   Physical Exam:  GEN well developed, pleasant, nad.   HEENT hearing intact to voice, moist oral mucosa   NECK supple  No masses  no bruits/jvd.   RESP normal resp effort  clear BS  diffuse scant rales   CARD Irregular rate and rhythm  Normal, S1, S2  No murmur   ABD denies tenderness  soft  normal BS   LYMPH negative neck   EXTR negative cyanosis/clubbing, negative edema   SKIN normal to palpation   NEURO motor/sensory function intact, grossly intact, nonfocal.   PSYCH alert, A+O to time, place, person, good insight   Review of Systems:  Subjective/Chief Complaint feels ok, chest pain resolved, mild SOB (chronic)   General: Weight loss or gain  Fatigue   Skin: No Complaints   ENT: No  Complaints   Eyes: No Complaints   Neck: No Complaints   Respiratory: Short of breath  on home O2, chronic SOB,   Cardiovascular: Dyspnea   Gastrointestinal: No Complaints  Nausea   Genitourinary: No Complaints   Vascular: No Complaints   Musculoskeletal: No Complaints   Neurologic: No Complaints   Hematologic: No Complaints   Endocrine: No Complaints   Psychiatric: No Complaints   Review of Systems: All other systems were reviewed and found to be negative   Medications/Allergies Reviewed Medications/Allergies  reviewed   Family & Social History:  Family and Social History:  Family History Coronary Artery Disease  mother died with MI in her early 73's.  Father died of cancer in his 45's.   Social History positive tobacco (Greater than 1 year), smoked ~ 1/3-1/2 ppd x 50+ yrs, quit 2014. No etoh/drugs.   Place of Living Home  Lives in [place] with wife, dtr, son-in-law.     Remote Tobacco Abuse: a. 1/3 - 1/2 ppd x 50+ yrs.  Quit 2014.   AAA: a. 09/2013 s/p endovascular stenting.   COPD with recurrent Bronchitis: a. On Home O2.   Orthostatic Hypotension and Syncope: a. On florinef;  b. 09/2013 Echo: EF 50-55%.   Permanent Afib: a. CHA2DS2VASc = 7, On eliquis.   Chronic Diastolic CHF: a. 08/2876 Echo: EF 50-55%, mild LVH, low nl RV fxn, mild to mod MR/TR.   Carotid Arterial Dzs: a. 2002 s/p bilat CEA.   TIA/CVA: a. 2002 s/p bilat CEA.   CAD: a. s/p CABG 2007;  b. s/p prior stenting including LM stenting;  c. 06/2013 Myoview: Lat ischemia;  d.  06/2013 Cath: LM stent patent, LIMA->LAD ok, VG->OM3 ok, VG->Diag known to be occluded.   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, Status: Active, Description: ANGINA PECTORIS  Home Medications: Medication Instructions Status  Imdur 30 mg oral tablet, extended release 1 tab(s) orally once a day (at bedtime) Active  ALPRAZolam 0.5 mg oral tablet 2 tab(s) orally every 6 hours, As Needed - for Anxiety, Nervousness/ chest pain/sob  Active  furosemide 20 mg oral tablet 1 tab(s) orally once a day Active  Klor-Con M20 oral  tablet, extended release 1 tab(s) orally once a day, As Needed when taking lasix Active  Symbicort 160 mcg-4.5 mcg/inh inhalation aerosol 2 puff(s) inhaled 2 times a day Active  Eligard 45 mg/6 months subcutaneous injection, extended release 45 milligram(s) subcutaneous every 6 months on the first of April and October Active  Nitrostat 0.4 mg sublingual tablet 1 tab(s) sublingual every 5 minutes up to 3 doses as needed for chest pain. *if no relief call md or go to emergency room* Active  fludrocortisone 0.1 mg oral tablet 1 tab(s) orally once a day (in the morning) take only 3 times a week for 5 pounds or more weight gain/ swelling. If no relief, take furosemide with potassium.Marland Kitchen Active  Crestor 20 mg oral tablet 1 tab(s) orally once a day (in the evening) Active  Fish Oil 1000 mg oral capsule 1 cap(s) orally 2 times a day Active  docusate sodium 100 mg oral tablet 1 tab(s) orally 2 times a day Active  Ventolin HFA 90 mcg/inh inhalation aerosol 2 puff(s) inhaled every 4 hours, As Needed - for Shortness of Breath Active  Spiriva 18 mcg inhalation capsule 1 caps via handihaler once a day (in the morning) Active  Senna 8.6 mg oral tablet 1 tab(s) orally once a day (in the morning) Active  Mucinex 600 mg oral tablet, extended release 1 tab(s) orally 2 times a day, As Needed for cough/congestion Active  sertraline 50 mg oral tablet 2 tab(s) orally once a day (in the morning) Active  albuterol-ipratropium 1 vial(s) inhaled 4 times a day, As Needed Active  pantoprazole 40 mg oral delayed release tablet 1 tab(s) orally 2 times a day Active  bisoprolol 5 mg oral tablet 1 tab(s) orally 2 times a day Active  nystatin 100000 units/mL oral suspension swish and swallow 5 milliliter(s) orally 2 times a day Active  ferrous sulfate 325 mg oral tablet 1 tab(s) orally 2 times a day Active  Aspirin Enteric Coated 81 mg oral delayed release tablet 1 tab(s) orally once a day (in the morning) Active  Zantac 150 150 mg  oral tablet 1 tab(s) orally once a day, As Needed - for Indigestion, Heartburn Active  midodrine 5 mg oral tablet 1 tab(s) orally 3 times a day as needed for low blood pressure. *blood pressure under 100=no lasix*, or imdur Active  Eliquis 5 mg oral tablet 0.5 tab(s) orally once a day (at bedtime) Active   Lab Results:  Routine Chem:  19-Sep-15 21:54   Glucose, Serum  105  BUN  30  Creatinine (comp) 1.14  Sodium, Serum 143  Potassium, Serum  3.2  Chloride, Serum  108  CO2, Serum 27  Calcium (Total), Serum  8.4  Anion Gap 8  Osmolality (calc) 292  eGFR (African American) >60  eGFR (Non-African American) >60 (eGFR values <52m/min/1.73 m2 may be an indication of chronic kidney disease (CKD). Calculated eGFR is useful in patients with stable renal function. The eGFR calculation will not be reliable in acutely ill patients when serum creatinine is changing rapidly. It is not useful in  patients on dialysis. The eGFR calculation may not be applicable to patients at the low and high extremes of body sizes, pregnant women, and vegetarians.)  B-Type Natriuretic Peptide ([facility]  2799 (Result(s) reported on 07 May 2014 at 10:20PM.)  Cardiac:  19-Sep-15 21:54   Troponin I < 0.02 (0.00-0.05 0.05 ng/mL or less: NEGATIVE  Repeat testing in 3-6 hrs  if clinically indicated. >0.05 ng/mL: POTENTIAL  MYOCARDIAL INJURY. Repeat  testing in 3-6 hrs if  clinically indicated. NOTE: An increase or decrease  of 30% or more on serial  testing suggests a  clinically important change)  20-Sep-15 02:23   Troponin I < 0.02 (0.00-0.05 0.05 ng/mL or less: NEGATIVE  Repeat testing in 3-6 hrs  if clinically indicated. >0.05 ng/mL: POTENTIAL  MYOCARDIAL INJURY. Repeat  testing in 3-6 hrs if  clinically indicated. NOTE: An increase or decrease  of 30% or more on serial  testing suggests a  clinically important change)    05:27   Troponin I < 0.02 (0.00-0.05 0.05 ng/mL or less: NEGATIVE  Repeat  testing in 3-6 hrs  if clinically indicated. >0.05 ng/mL: POTENTIAL  MYOCARDIAL  INJURY. Repeat  testing in 3-6 hrs if  clinically indicated. NOTE: An increase or decrease  of 30% or more on serial  testing suggests a  clinically important change)  Routine Coag:  19-Sep-15 21:54   Prothrombin  16.0  INR 1.3 (INR reference interval applies to patients on anticoagulant therapy. A single INR therapeutic range for coumarins is not optimal for all indications; however, the suggested range for most indications is 2.0 - 3.0. Exceptions to the INR Reference Range may include: Prosthetic heart valves, acute myocardial infarction, prevention of myocardial infarction, and combinations of aspirin and anticoagulant. The need for a higher or lower target INR must be assessed individually. Reference: The Pharmacology and Management of the Vitamin K  antagonists: the seventh ACCP Conference on Antithrombotic and Thrombolytic Therapy. KJZPH.1505 Sept:126 (3suppl): [id number]. A HCT value >55% may artifactually increase the PT.  In one study,  the increase was an average of 25%. Reference:  "Effect on Routine and Special Coagulation Testing Values of Citrate Anticoagulant Adjustment in Patients with High HCT Values." American Journal of Clinical Pathology 2006;126:400-405.)  Routine Hem:  19-Sep-15 21:54   WBC (CBC)  13.1  RBC (CBC)  3.38  Hemoglobin (CBC)  10.3  Hematocrit (CBC)  31.6  Platelet Count (CBC) 165 (Result(s) reported on 07 May 2014 at 10:08PM.)  MCV 94  MCH 30.6  MCHC 32.7  RDW  15.4   EKG:  EKG Interp. by me   Interpretation EKG shows afib, poor r prog, old inf infarct.   Radiology Results: XRay:    19-Sep-15 22:06, Chest Portable Single View  Chest Portable Single View   REASON FOR EXAM:    chest pain; shortness of breath  COMMENTS:       PROCEDURE: DXR - DXR PORTABLE CHEST SINGLE VIEW  - May 07 2014 10:06PM     CLINICAL DATA:  Sternal chest pain, radiating down  left arm.  Shortness of breath.    EXAM:  PORTABLE CHEST - 1 VIEW    COMPARISON:  Chest radiograph from 03/29/2014    FINDINGS:  The lungs are well-aerated. Mild vascular congestion is noted. A  small left pleural effusion is seen. Mildly increased interstitial  markings could reflect minimal interstitialedema, given the  patient's symptoms. No pneumothorax is identified    The cardiomediastinal silhouette is mildly enlarged. The patient is  status post median sternotomy, with evidence of prior CABG. No acute  osseous abnormalities are seen.     IMPRESSION:  Mild vascular congestion and mild cardiomegaly. Small left pleural  effusion seen. Mildly increased interstitial markings could reflect  minimal interstitial edema.      Electronically Signed    By: Garald Balding M.D.    On: 05/07/2014 22:52        Verified By: JEFFREY . CHANG, M.D.,    No Known Allergies:   Vital Signs/Nurse's Notes: **Vital Signs.:   20-Sep-15 11:58  Vital Signs Type Routine  Temperature Temperature (F) 98  Celsius 36.6  Temperature Source oral  Pulse Pulse 90  Respirations Respirations 17  Systolic BP Systolic BP 697  Diastolic BP (mmHg) Diastolic BP (mmHg) 58  Mean BP 72  Pulse Ox % Pulse Ox % 96  Pulse Ox Activity Level  At rest  Oxygen Delivery 2L    Impression 1) Chest pain atypical in nature, more consistent with his rheumatologic issue (polymyalgias rheumatica per the patient) presenting with chest pain. 9/10 initially after bending over and then standing up, then "all joints hurt"  No changes on EKG, cardiac enz negative,  Currently 4/10 pain relieved with morphine, tender to palpation --may need higher dose prednisone, rheum consult and outpt follow up  2) Diffuse arthritis/polymyalgia rheumatica managed by rheum as an outpt, on prednisone sx of arthritis much better on prednisone Chest pain could possibly be recurrent rheumatic arthritis issue as prednisone is weaned.    2. Syncope/Orthostatic hypotn:   No recent syncope  finished 30 day event monitor, no significant arrhythmia, though had no syncope with monitor in place outpt follow up with Dr. Caryl Comes COntinue florinef for BP support, midodrine PRN  3.  CAD: Previous admissions for chest pain Previously had pleuritic, musculoskeletal sx.  4) Chronic diastolic CHF Seen recently in clinic, weight has been stable on outpt lasix regimen  5.  HTN:   Stable on current meds. Will avoid hypotension given hx of syncope  6.  HL:   On statin.  7.  COPD:   O2 dependent.  chronic mild SOB would monitor sx with diuresis  8.  PVD:  S/P bilat CEA.  Stable findings on recent carotid u/s  9.  AAA:  S/P endovascular stent grafting in Feb.  Stable CTA on 5/18.  10. Afib:   Rate-controlled and asymptomatic.  Anticoagulated with Eliquis. h/o TIAs and CVA off anticoagulation  11. dispo: frequent admissions for numerous, different issues each admission. Need to consider nursing home   Electronic Signatures: Ida Rogue (MD)  (Signed 20-Sep-15 14:36)  Authored: General Aspect/Present Illness, History and Physical Exam, Review of System, Family & Social History, Past Medical History, Health Issues, Home Medications, Labs, EKG , Radiology, Allergies, Vital Signs/Nurse's Notes, Impression/Plan   Last Updated: 20-Sep-15 14:36 by Ida Rogue (MD)

## 2014-12-10 NOTE — H&P (Signed)
PATIENT NAME:  Javier [name], CLINGENPEEL MR#:  [id number] DATE OF BIRTH:  1936/02/11  DATE OF ADMISSION:  02/13/2014  REASON FOR ADMISSION: Chest pain.   PRIMARY CARDIOLOGIST: Dr. Rockey Situ.   PRIMARY CARE PHYSICIAN: Dr. >   HISTORY OF PRESENT ILLNESS: The very nice 79 year old gentleman who has been hospitalized here on multiple occasions. Last time he was here was on 01/07/2014, discharged the same day. The reason for that admission was syncopal episode. Apparently the patient has had three syncopal episodes, and after that he was put on loop recorder monitor. The patient just finished his loop recorded yesterday and today he comes with a history of chest pain. The patient went to bed being his normal self without any significant symptomatology. Wake up at 7:30 in the morning and had increased shortness of breath with chest pressure. The chest pressure was located in the middle of the chest across the sternum, pressure-like, with intensity of 9 out of 10. The pain relieved to some degree after taking nitroglycerin. He took 3 times nitroglycerin at home and the pain went down to 7/10. He called paramedics as the pain did not go away, and the paramedics gave him 3 more doses of nitroglycerin with the relief down to 5. The patient came up to the Emergency Department and he was put on nitroglycerin drip and his pain was somewhere around 2/10. As everything goes he says that he did not have any sweating. No nausea.  He coughs but he always does, there is no changes on on that. He feels like his pain is worse with deep breaths, better with rest. The shortness of breath has been an issue here. The patient is admitted for evaluation of unstable angina versus pleuritic chest pain. The pain is not radiating to the back or to the abdomen. He has history of abdominal aortic aneurysm. His last cardiac catheterization was done on 07/08/2013 revealing a patent left main stent with  patent graft with well-known occlusion of the  diagonal. The patient has been taking midodrine due to orthostatic hypotension and, apparently, there was some changes in his rhythm, which are nonspecific whenever he was here last time for which to loop recorder monitor was ordered. There has to be a determination of the patient needs a pacemaker or not apparently, and I will be done by Dr. Humphrey Rolls. The patient admitted for evaluation unstable angina.   REVIEW OF SYSTEMS: A 12 system review of systems is done.   CONSTITUTIONAL: No fever, fatigue, weakness, weight loss, the patient has may be 1 or 2 pounds of weight gain in the last couple of days but no signs of congestive heart failure. He has some edema today.  EYES: No blurry vision, double vision.  EARS, NOSE, THROAT: No difficulty swallowing or tinnitus.  RESPIRATORY: Positive for chronic cough, no hemoptysis.  CARDIOVASCULAR: As mentioned above. Positive syncope. Positive palpitations. Positive chronic arrhythmia. Positive edema. Positive congestive heart failure.  GASTROINTESTINAL: No nausea, vomiting, abdominal pain, constipation, diarrhea.  GENITOURINARY: No dysuria or hematuria.  HEMATOLOGIC AND LYMPHATIC: No anemia, easy bruising or bleeding.  ENDOCRINE: No polyuria or polydipsia.  SKIN: No rashes or petechiae.  MUSCULOSKELETAL: No significant joint pain or gout at this moment.  NEUROLOGIC: No numbness, tingling positive previous transient ischemic attack.  PSYCHIATRIC: No significant agitation. The patient has no significant depression.   PAST MEDICAL HISTORY: 1. Coronary artery disease  2. Chronic obstructive pulmonary disease.  3. Chronic respiratory failure on 2 liters of oxygen.  4. Obstructive  sleep apnea.  5. Peripheral vascular disease.  6. Congestive heart failure, diastolic ejection fraction of 50% to 55%.  7. Hypertension.  8. Transient ischemic attack.  9. Morbid obesity.  10. Prostate cancer.  11. History of cerebrovascular accident, requiring TPA in the past.   12. Chronic atrial fibrillation.  13. Chronic anticoagulation.   PAST SURGICAL HISTORY:  1. Lasik  2. Prostate surgery.  3. Coronary cardiac stents.  4. Coronary artery bypass graft.  5. Carotid endarterectomy.    ALLERGIES: Not known drug allergies.   FAMILY HISTORY: Positive for esophageal and throat cancer on his dad.  Denies any history of other cancers in the family. Positive for myocardial infarction in her mother who died at 45.   SOCIAL HISTORY: The patient's smoke 1/2 pack a day over 50 years,  quit in 2014 apparently. No alcohol or drugs. Lives with his wife,  daughter and son-in-law.   CURRENT MEDICATIONS: Vitamin C 1000 mg daily, Ventolin 90 mcg 2 puffs 4 times a day Symbicort 150/4.5 mg once a day, sucralfate 1 gram 4 times a day Spiriva 18 mcg daily, sertraline 50 mg daily. Senna 8.6 mg once a day, Robitussin-DM 10 mg 4 times a day as needed, Protonix 40 mg twice daily, nystatin as needed, nitroglycerin as needed for pain, Mucinex 600 mg twice daily. Midodrine 5 mg 3 times daily, Hydromet 1.5/5 mg as needed. Furosemide 20 mg every other day, fludrocortisone 0.1 mg daily, ferrous sulfate 325 mg twice daily,  DuoNebs as needed. docusate 100 mg twice daily, Crestor 20 mg once a day, bisoprolol 5 mg 2 times a day, aspirin 81 mg daily, Eliquis 5 mg twice daily, alprazolam  p.r.n.   PHYSICAL EXAMINATION:  VITAL SIGNS: Blood pressure 131/89, pulse 75, respirations 19, temperature 98.1. Oxygen saturation 98% on 2 liters which is his normal home oxygen.  GENERAL: The patient is alert and oriented x3, in no acute distress whatsoever. No respiratory distress.  HEENT: Pupils are equal and reactive. Extraocular movements are intact. Mucosae are moist. Anicteric sclerae. Pink conjunctivae. No oral lesions. No oropharyngeal exudates.  NECK: Supple. No JVD. No thyromegaly. No adenopathy. No carotid bruits.  CARDIOVASCULAR: Positive tenderness to palpation at the level of the sternum. Any  pressure elicits significant pain, which is sharp in acute. The patient says that is similar to think that he has whenever he takes deep breath.  CARDIOVASCULAR: In general, irregularly irregular with atrial fibrillation, chronic, well-known. He does not have any significant murmurs, rubs, or gallops at this moment. No displacement of PMI.  LUNGS: Overall clear with some crepitations at the level of both bases which seem very chronic and no signs of fluid exacerbation. No use of accessory muscles. No dullness to percussion.  ABDOMEN: Soft, nontender, nontender, mildly distended, chronically. No masses or hepatosplenomegaly. Bowel sounds are positive.  GENITAL: Negative for external lesions.   EXTREMITIES: Positive edema, +1. No cyanosis or clubbing.  VASCULAR: Pulses +1 capillary refill around 3 seconds. Bevelyn Buckles is negative.  MUSCULOSKELETAL: No joint effusion or joint swelling.   SKIN: No rashes or petechiae.                  LYMPHATIC: Negative for lymphadenopathy in neck or supraclavicular areas.  PSYCHIATRIC: No agitation. The patient is oriented x 3.  NEUROLOGIC: Cranial nerves II through XII intact. No focal findings.   IMPORTANT RESULTS: BNP four for lunch, and 4700. BUN 22, creatinine 1.47, which is a little bit above, his 1.28 has chronic kidney  disease stage III, right now his GFR is 46, sodium 143, potassium 3.7. Troponin is negative x 1. White count is 8.8, hemoglobin 11.1, platelet count 144. INR is 1.4. Urinalysis is pending   EKG: No significant ST depression or elevation. Positive known atrial fibrillation with left axis deviation and signs of previous inferior myocardial infarction.   Chest x-ray: Little bit of increase of asymmetric edema or infiltrates and persistent cardiomegaly, when compared with previous, seems like a little bit increase, so the patient might have a little degree of acute congestive heart failure on top of that. No infiltrates. No widening of the mediastinum.    ASSESSMENT AND PLAN: A 79 year old gentleman with multiple medical problems including coronary artery disease and sleep apnea, obesity, chronic obstructive pulmonary disease, chronic respiratory failure, on 2 liters of oxygen, transient ischemic attacks despite Coumadin for what this been changed  on Eliquis.  Prostate cancer, atrial fibrillation, etc.  Comes today with acute chest pain.  1. Unstable angina. The patient has significant chest pain, which is located in the middle of the chest, relieved to some degree with nitroglycerin. He has some characteristics of pleuritic pain for what we are going to admit him and observe him overnight, treat him as unstable angina to some degree. I had a discussion with Dr. Rockey Situ who agree with the plan right now and overall he is okay with me leaving him on a Eliquis alone without the heparin. If for some reason there is any positive enzymes we will hold the Eliquis and change it to heparin drip. The patient has been started on nitroglycerin drip and his pain is really good right now I think we can start transitioning into nitroglycerin patch and see how he reacts to it. If the pain bounced, back we are going to put him back on a nitroglycerin drip. So, the patient is stable. He is already on a beta blocker. He has history of significant orthostatic hypotension for which, it will be difficult to adjust the medications. The patient also had a history of strokes and transient ischemic attacks while he was on Coumadin for what he is now on Eliquis. Whenever I talked  to Dr. Rockey Situ he  preferred the patient be on Eliquis as long as possible. If there is any positive cardiac enzymes  within next hours or on the repeat test, we will switch the Eliquis for heparin, but but for now, we are going to just continue that current medication. Continue morphine as needed. Continue oxygen as a home regimen 2 liters and increase if  necessary.  2. Overall at this moment, we are just  going to rule him out  with cardiac enzymes. No need for an echocardiogram or a stress test as he had recent tests done, and especially he had a catheterization back in November of last year as mentioned above. The patient seems hemodynamically stable and he will be sent to telemetry.  3. As far as his other medical problems: Atrial fibrillation. Continue Eliquis. Continue low dose beta blocker. The patient is on rate controlled.  4. Congestive heart failure. The patient has a slight exacerbation of congestive heart failure. His x-ray has significant changes compared with previous with slight pulmonary edema. The patient does not take Lasix on a regular basis. At this moment, we are just going to put him on daily doses of p.o. Lasix. Monitor closely as he has a slight bump on his creatinine. Usually creatinine is around 1.2, right now is 1.4. Monitor  closely again and avoid other nephrotoxins. Continue oral Lasix for now.  5. Hypertension. The patient has also history of orthostatic hypotension for what we are going to continue his midodrine .  6. History of cerebrovascular accident. Continue Eliquis. No signs of new neurologic symptoms. No signs of new transient ischemic attacks.  7. Syncope. The patient had a loop monitor that he returned to  cardiology office in the last couple of days. No active syncope since the patient has been wearing his loop recorder. He is stable.  8. As far as his chronic respiratory failure. We continue oxygen at 2 liters for now. Chronic obstructive pulmonary disease. There are no signs of exacerbation at that moment.   TIME SPENT: I spent about 45 minutes with this patient. Continue  gastrointestinal prophylaxis with Protonix deep vein thrombosis prophylaxis with Levaquin.   Case discussed with Dr. Rockey Situ    ____________________________ Palmas Sink, MD rsg:sg D: 02/13/2014 12:25:50 ET T: 02/13/2014 13:15:36 ET JOB#: [id number]  cc: [place] Sink, MD, <Dictator> ROBERTO America Brown MD ELECTRONICALLY SIGNED 03/03/2014 1:41

## 2014-12-10 NOTE — H&P (Signed)
PATIENT NAME:  Javier [name], Javier [name] MR#:  [id number] DATE OF BIRTH:  Jan 09, 1936  DATE OF ADMISSION:  01/07/2014  PRIMARY CARE PHYSICIAN: Dr. Francee Nodal  REFERRING EMERGENCY ROOM PHYSICIAN:  Dr. Karma Greaser   CHIEF COMPLAINT: Syncope.   HISTORY OF PRESENT ILLNESS: The patient is a 79 year old pleasant Caucasian male with multiple medical problems who came to the ER via EMS with complaints of fall and syncopal episode. Family reported that they heard patient falling onto the ground, probably his loss of consciousness lasted for 5-10 seconds. The patient reports that at around 9:30 p.m. last night he went to bathroom.  While coming out of the bathroom, after taking 10 steps, suddenly without any prodrome he fell on the ground and he hurt his back and neck. He could not recall having any chest pain, shortness of breath, dizziness, black floaters prior to fall. Recently, patient had another episode of syncope in April following which he was seen by his cardiologist, Dr. Rockey Situ. The patient was told that his blood pressure was running low at that time and some medication adjustments were done. Today, in the ER, patient's CAT scan of the head did not reveal any acute abnormality. CT of the neck did not reveal any fractures. Orthostatics were normal and all vital signs seemed to be stable. During my examination, the patient denies any chest pain, shortness of breath, abdominal pain, nausea, vomiting, diarrhea. Denies any dizziness. He has reported that he did not have any symptoms prior to the syncopal episode. He is resting comfortably, denies any headache or blurry vision. No other complaints. No family members are at bedside. The patient had a history of abdominal aortic aneurysm, status post graft. He had a CAT scan of the abdomen regarding followup on the abdominal aortic aneurysm, which has revealed a normal graft site.   PAST MEDICAL HISTORY: History of coronary artery disease, obstructive sleep apnea,  peripheral vascular disease, and history of diastolic congestive heart failure with a recent echocardiogram done on 10/09/2013. Has revealed left ventricular ejection fraction of 50% to 55%, hypertension, prostate cancer on Eligard. He gets shots every 6 months regarding the prostate cancer.  TIA, peripheral vascular disease, morbid obesity,  obstructive sleep apnea, history of CVA requiring tPA, chronic atrial fibrillation on Eliquis.  His Coumadin was discontinued. History of COPD, lives on 2 liters of oxygen at home. He indicates he has chronic respiratory failure.   PAST SURGICAL HISTORY: Status post coronary artery bypass grafting, carotid endarterectomy, cardiac stents. Prostate surgery, Lasix surgery.   ALLERGIES: No known drug allergies.   PSYCHOSOCIAL HISTORY: Lives at home with wife. Denies smoking, alcohol, or illicit drug usage.   FAMILY HISTORY: Father died from complications of throat and esophageal cancers.    REVIEW OF SYSTEMS:  CONSTITUTIONAL: Denies any fever or chills. No weight loss or weight gain.    HEENT:  Eyes: Denies blurry vision, double vision. No edema. No glaucoma. ENT: Denies any epistaxis, discharge, tinnitus. No seasonal allergies. Denies any dysphagia.  CARDIOVASCULAR: Denies any chest pain, palpitations, shortness of breath. Had syncope.  RESPIRATORY: Denies any coughing, wheezing, or hemoptysis. GASTROINTESTINAL: Denies nausea, vomiting, diarrhea, hematemesis, or melena.  GENITOURINARY: No dysuria, hematuria. Denies any urinary frequency.  ENDOCRINE: Denies polyuria, nocturia, heat or cold intolerance.  SKIN: Denies any skin rashes, lesions.  PSYCHIATRIC: Normal mood and affect.  NEUROLOGICAL: Denies any history of TIA but had history of CVA.  HOME MEDICATIONS: Zoloft 50 mg p.o. once daily, vitamin C 1000 mg 1 tablet p.o.  once daily, Midrin 2 puffs inhalation every 4 hours as needed, Symbicort 160 mcg 2 puffs inhalation 2 times a day, Spiriva 18 mcg 1  HandiHaler once a day, senna 8.6 mg 1 tablet p.o. once a day, Prilosec 20 mg 2 tablets p.o. once a day, Nitrostat 0.4 mg 1 tablet sublingually every 5 minutes x 3 as needed for chest pain, Mucinex 600 mg 1 tablet p.o. 2 times a day, montelukast 10 mg p.o. once daily, Klor-Con 20 mg p.o. once daily , furosemide 40 mg 1 tablet p.o. once a day, fludrocortisone 0.1 mg 1 tablet p.o. once a day, fish oil 1000 mg 1 capsule 2 p.o. b.i.d., iron sulfate 325 mg p.o. b.i.d., Eligard every 6 months subcutaneous shot, Coreg100 mg p.o. b.i.d., Crestor 20 mg p.o. once daily, metoprolol 5 mg p.o. b.i.d., aspirin 81 mg once daily, apixaban 5 mg p.o. b.i.d., alprazolam  0.5 mg 1 tablet p.o. 3 times a day, Tylenol every 4 to 6 hours as needed basis.   PHYSICAL EXAMINATION:  VITAL SIGNS: Temperature 97.7, pulse 80, respirations 18, blood pressure 148/81, pulse oximetry 98%.  GENERAL APPEARANCE: Not in any acute distress. Moderately built and nourished.  HEENT: Normocephalic, atraumatic. Pupils are equally reacting to light and accommodation. No scleral icterus. No conjunctival injection. No sinus tenderness. No postnasal drip.  NECK: Supple.  No JVD, no thyromegaly.   Range of motion is intact.  LUNGS: Clear to auscultation bilaterally. No accessory muscle use. No anterior chest wall tenderness on palpation.  CARDIOVASCULAR: Irregularly irregular. No murmur.   GASTROINTESTINAL:  Soft. Bowel sounds are positive in all 4 quadrants. Obese, nontender, nondistended. No hepatosplenomegaly. No masses felt.  NEUROLOGICAL:  Awake, alert, oriented x 3.  Cranial nerves II-XII are grossly intact. Motor and sensory are intact. Reflexes are 2+ . EXTREMITIES: No edema, no cyanosis, no clubbing.  SKIN: Warm to touch. Normal turgor. No rashes. No lesions.  MUSCULOSKELETAL: No joint effusion, tenderness, erythema.  PSYCHIATRIC: Normal mood and affect.   LABORATORY AND IMAGING STUDIES: Chem-8, glucose is normal. BUN, creatinine, sodium,  potassium, and chloride are normal, GFR of 58. Anion gap is 4. Serum osmolality and calcium results are normal. Troponin less than 0.00. WBC 6.6, hemoglobin 12.0, hematocrit 36.7, platelets are 160,000, MCV 92. A 12-lead EKG has revealed atrial fibrillation at 85 ventricular rate. Recent echocardiogram done on February 21st has revealed a left ventricular ejection fraction of 50% to 55%. CT of the abdomen with contrast has revealed no evidence of acute abnormality.  Aortic stent graft is slightly decreased the size of the previous abdominal aortic aneurysm site. No definite complicating features ,small to moderate right pleural effusion and mild cardiomegaly.  No thoracic and lumbar compression fractures are  noted. CAT scan of the head and cervical spine: No acute abnormality, atrophy, and chronic small vessel white matter ischemic changes.  Mild cervical degenerative changes.   ASSESSMENT AND PLAN: A 79 year old Caucasian male presenting to the ER with a chief complaint of syncope.  1. Syncope, probably from cardiac arrhythmia.  Differential diagnosis neurologic  versus vasovagal.  Patient does not have any orthostatic hypotension and orthostatics were checked in the ER. We will admit him to telemetry. We will obtain carotid Dopplers, cycle cardiac biomarkers. Cardiology consult is placed to Dr. Rockey Situ who is his cardiologist. Recent echo has revealed  an ejection fraction of 50%-55%.  2. History of diastolic congestive heart failure. We will resume Lasix and continue potassium supplementation with his home medications. No exacerbation of congestive  heart failure and fluid overload noted at this time.  3. Coronary artery disease. Continue home medications, aspirin and statin.  4. History of stroke. Continue aspirin and statin.  5. Chronic history of peripheral vascular disease. No venous stasis wounds or infection noted at this time. 6. Obstructive sleep apnea. Continue 2 liters of oxygen via nasal  cannula. The patient is oxygen dependent.   7. History of chronic atrial fibrillation, rate controlled. On Eliquis, we will continue the same.  8. I will provide gastrointestinal prophylaxis. Deep vein thrombosis prophylaxis is not needed as the patient is on Eliquis.  CODE STATUS:  Patient is full code. Wife and daughter are the medical powers of attorney.   Diagnosis and plan of care was discussed in detail with the patient. He verbalized understanding of the plan.    ____________________________ Nicholes Mango, MD ag:dd/am D: 01/07/2014 02:02:24 ET T: 01/07/2014 03:08:19 ET JOB#: [id number]  cc: Nicholes Mango, MD, <Dictator> Francee Nodal, MD  Nicholes Mango MD ELECTRONICALLY SIGNED 01/11/2014 1:51

## 2014-12-10 NOTE — Discharge Summary (Signed)
PATIENT NAME:  Javier [name], Javier [name] MR#:  [id number] DATE OF BIRTH:  1936/03/29  DATE OF ADMISSION:  03/06/2014 DATE OF DISCHARGE:  03/09/2014  ADMISSION DIAGNOSES: Chest pain, shortness of breath.   DISCHARGE DIAGNOSES:  Chronic stable angina producing chest pain. The patient had a stress test that showed a fixed defect which was known from previous.   OTHER DIAGNOSES: Include:  1.  Multiple admissions due to similar issues.  2.  Diastolic congestive heart failure, chronic and stable without signs of exacerbation.  3.  Syncope due to orthostatic hypotension, not active at this time.  4.  Coronary artery disease.  5.  Hypertension.  6.  Hyperlipidemia.  7.  Chronic obstructive pulmonary disease.  8.  Chronic respiratory failure, oxygen dependent at 2 liters nasal cannula.  9.  Peripheral vascular disease, status post carotid endarterectomy, bilateral.  10. Abdominal aortic aneurysm status post repair.  11. Atrial fibrillation.  12. Chronic anticoagulation.   PERTINENT DIAGNOSTIC RESULTS DURING THIS HOSPITALIZATION: Include:  A stress test that, as mentioned above, showed a fixed defect. Ejection fraction 46%. The defect was noticed in the inferolateral region where the patient had a previous scar on his EKG with Q-waves. No other significant abnormality noted. No acute changes in perfusion.   HOSPITAL COURSE: This is a very nice 79 year old gentleman who has a history of multiple admissions to our hospital due to chest pain and shortness of breath. The patient came up in  03/06/2014 with a complaint of chest pain and shortness of breath. He is on 2 liters nasal cannula at home but he states that he was having more trouble breathing over the past couple of days. The shortness of breath got gradually worse. He has dyspnea on exertion and he started having some increase in lower extremity edema at some point during the previous hospitalization, which improved after he was put on Lasix. The patient  started to have chest pain radiating to the left arm, squeezing in quality and 10 out of 10 in intensity. No worsening or relieving factors. The patient became very anxious and decided to come to the Emergency Department.  By that time the pain had resolved.   The patient was evaluated by cardiology, who determined that a stress test may give [place] an answer. The stress test showed results as mentioned above. Cardiology recommended for the patient to have Ranexa; unfortunately the patient does not have the ability to pay for this medication, and his other option would be Imdur.    The patient is having a lot of issues with admissions and on top of that he is not able to perform a very well at home. He is weak and he has not really bounced back from his previous multiple hospitalizations and the acute CHF exacerbations.  We thought that sending him to a skilled nursing facility for rehabilitation would be best.  The patient qualified to go to [facility] today.   The patient overall is doing okay. As far as his other medical problems he is stable. Dr. Ellyn Hack has evaluated him today and he feels like his last workup for ischemia in November 2014 with a Myoview has not changed significantly to the one done yesterday and that everything correlates with the changes seen on the EKG. Dr. Ellyn Hack cannot exclude microvascular disease for which Ranexa would be indicated twice daily, as he mentioned, but due to the insurance coverage the patient is not able to afford Ranexa, so Dr. Ellyn Hack recommended that the patient  start Imdur.   DISCHARGE DIAGNOSES:  1.  Syncope: The patient has had event monitor without any significant arrhythmias. The patient has significant orthostatic hypotension for which he takes midodrine 3 times a day and since he has been on midodrine the have not persisted.  2.  Coronary artery disease: The patient had a coronary artery bypass graft before. As mentioned above, microvascular disease is  a big issue. The patient is going to a skilled nursing facility. Physical therapy is going to be instituted to increase endurance. The patient is aware that he has chronic angina and that he is going to have chest pain if he does certain activities.  The patient is being encouraged to do physical activity under supervision with physical therapy.  3.  Chronic diastolic congestive heart failure: At this moment, there are no signs of volume overload, it is compensated and chronic.  4.  Chronic obstructive pulmonary disease is a chronic issue but has not been exacerbated lately. He is on oxygen at home.   5.  Hypertension: The patient is stable on his current medications. No changes.    The patient's other medical problems are stable.   TIME SPENT: I spent about 45 minutes discharging this patient.   MEDICATIONS AT DISCHARGE: Symbicort 160/4.5 two puffs twice daily, Eligard 45 mg every 6 months, Ventolin 9 micrograms 2 puffs every 4 hours for shortness of breath, nitroglycerin as needed for chest pain, fludrocortisone 0.1 mg once a day, Crestor 20 mg once daily, fish oil 100 mg 2 times a day, docusate 100 mg twice daily, Spiriva 18 mcg once daily,  Senna as needed for constipation, Mucinex 600 mg twice daily as needed for congestion, Klor-Con 20 mEq once a day, Protonix 40 mg once daily, midodrine 5 mg 3 times daily, bisoprolol 5 mg twice daily, nystatin as needed for thrush, Hydromet as needed for cough, Robitussin as needed for cough and congestion, ferrous sulfate 325 mg twice daily, aspirin 81 mg daily, Eliquis 5 mg 2 times daily, Apresoline 0.5 mg 2 tablets p.r.n. anxiety, Zantac 150 mg once a day, furosemide 20 mg once a day, sertraline 50 mg 2 tablets once a day, albuterol 1 puff q.4 hours as needed for shortness of breath. We are going to add Imdur 30 mg p.o. at bedtime.  FOLLOWUP: With cardiology in 2 weeks.    ____________________________ Landis Sink, MD rsg:lt D: 03/09/2014  11:45:34 ET T: 03/09/2014 13:23:59 ET JOB#: [id number]  cc: Cabazon Sink, MD, <Dictator> Ocie Stanzione America Brown MD ELECTRONICALLY SIGNED 03/15/2014 0:27

## 2014-12-10 NOTE — Discharge Summary (Signed)
PATIENT NAME:  Javier [name], Javier [name] MR#:  [id number] DATE OF BIRTH:  06/23/1936  DATE OF ADMISSION:  09/20/2013 DATE OF DISCHARGE:  09/26/2013  PRIMARY CARE PHYSICIAN: Dr. Barbette Reichmann.   FINAL DIAGNOSES:  1. Chronic obstructive pulmonary disease exacerbation.  2. History of congestive heart failure but no signs on this hospital stay.  3. Atrial fibrillation.  4. Hypertension.  5. Gastroesophageal reflux disease.   6. Hyperlipidemia.   MEDICATIONS ON DISCHARGE: Include Symbicort 160/4.5 two puffs twice a day, ferrous sulfate 325 mg 1 tablet twice a day, Eligard 45 mg per 6 month subcutaneous injection extended release, Singulair 10 mg at bedtime, Ventolin HFA 90 mcg/inhalation 2 puffs every 4 hours as needed for shortness of breath, morphine 20 mg/mL 0.25 mL every 4 hours as needed for shortness of breath, Nitrostat 0.4 mg 1 tablet sublingual every 5 minutes as needed for chest pain, fludrocortisone 0.1 mg in the morning, Zoloft 50 mg in the morning, Crestor 20 mg in the evening, fish oil 1000 mg twice a day, Colace 100 mg twice a day, Lasix 20 mg in the morning as needed for swelling, DuoNeb nebulizer solution 3 mL 4 times a day as needed for shortness of breath, aspirin 81 mg daily, vitamin C 1000 mg in the morning, metoprolol 50 mg twice a day, Spiriva 1 inhalation daily, Prilosec 20 mg daily, Senna 8.6 mg in the morning, potassium chloride 1 tablet daily as needed for swelling when taking Lasix, Mucinex 600 mg twice a day as needed for cough or congestion, Robitussin-DM 10 mL every 4 hours as needed for cough, oxycodone/acetaminophen 5/325 one tablet every 4 hours as needed for pain, warfarin 1.5 mg at bedtime, nystatin 1000 units/mL 5 mL every 6 hours until completion, Xanax 0.25 mg every 6 hours as needed for anxiety, prednisone 10 mg 4 tablets day one, 3 tablets day two, 2 tablets days three and four, 1 tablet days five and six, 1/2 tablet days 7 and 8.   HOME HEALTH: Resume physical therapy  and nurse. Two liters nasal cannula at night.   DIET: Low-sodium diet, regular consistency.   ACTIVITY: As tolerated.   FOLLOWUP: With Dr. Raul Del next week, 1 to 2 weeks with Dr. Casimer Lanius.   HOSPITAL COURSE: The patient was admitted 09/20/2013, discharged 09/26/2013. Came in with cough, sputum, shortness of breath for 2 days. Admitted to telemetry for COPD, coagulopathy and dehydration.   LABORATORY: EKG: Atrial fibrillation, rapid ventricular response. INR 4.1. Magnesium 1.9. Glucose 130, BUN 25, creatinine 1.07, sodium 136, potassium 3.8, chloride 104, CO2 of 30, calcium 8.6. Liver function tests normal range. Troponin negative. Blood cultures negative. Chest x-ray: Interval decrease in pulmonary edema pattern with persistent basilar atelectasis. Cardiac enzymes x 3 negative. TSH slightly low at 0.158. INR upon discharge 3.8.   HOSPITAL COURSE BY PROBLEM LIST:  1. For the patient's COPD exacerbation, the patient received IV Zithromax here in the hospital, IV Solu-Medrol. Switched over to prednisone taper. He finished his full Zithromax course in the hospital. The patient was feeling better and wanted to go home. Very slight expiratory wheeze heard. Can follow up with Dr. Raul Del as outpatient. Continue inhalers and nebulizers.  2. History of CHF but no signs on this hospital stay. The patient initially was given Lasix but that was stopped. The patient was concerned that he only takes Lasix when he gets swelling, so we held off on the Lasix the rest of the hospital day.  3. Atrial fibrillation: Therapeutic on Coumadin. Dose  decreased down to 1.5 mg daily. Continue to follow up INR as outpatient. The patient is rate controlled.  4. Hypertension: Blood pressure borderline during the hospital course. Continue outpatient medication. Blood pressure upon discharge 155/89.  5. Hyperlipidemia, on Crestor.  6. Gastroesophageal reflux disease, on Prilosec.   TIME SPENT ON DISCHARGE: 35 minutes.    DISPOSITION: The patient improved from admission. Sent home in stable condition.   ____________________________ Tana Conch. Leslye Peer, MD rjw:gb D: 09/27/2013 17:13:07 ET T: 09/28/2013 00:06:01 ET JOB#: [id number]  cc: Tana Conch. Leslye Peer, MD, <Dictator> Francee Nodal, MD Dr. Cari Caraway MD ELECTRONICALLY SIGNED 10/09/2013 12:14

## 2014-12-10 NOTE — H&P (Signed)
PATIENT NAME:  Javier [name], Javier [name] MR#:  [id number] DATE OF BIRTH:  [birth year]  DATE OF ADMISSION:  03/06/2014  REFERRING PHYSICIAN: Schaevitz.   CARDIOLOGIST: Gollan.   PRIMARY CARE PHYSICIAN: Shapley.    CHIEF COMPLAINT: Chest pain, shortness of breath.   HISTORY OF PRESENT ILLNESS: A 79 year old Caucasian gentleman with history of COPD with chronic respiratory failure on 2 liters nasal cannula at baseline, coronary artery disease, diastolic congestive heart failure, ejection fraction of 50% to 55%, atrial fibrillation on Eliquis for anticoagulation, presenting with chest and shortness of breath. Describes 2-day duration of shortness of breath, gradually worsening. Mainly described as dyspnea on exertion without associated cough, fevers, chills. He does note having increasing lower extremity edema; however, denies any orthopnea. He now has acute onset of chest pain lasting about an hour. It occurred at rest. Located over left chest, radiating to the left arm. Squeezing in quality 10 to 10 intensity. No worsening or relieving factors. No further symptomatology at this time.   REVIEW OF SYSTEMS:   CONSTITUTIONAL: Denies fever, fatigue, weakness.  EYES: Denies blurry vision, double vision or eye pain.  EARS, NOSE, THROAT: Denies tinnitus, ear pain, hearing loss.  RESPIRATORY: Positive for shortness of breath. Denies cough or wheezing.  CARDIOVASCULAR: Positive for chest pain and lower extremity edema as described above. Denies any orthopnea or palpitations.  GASTROINTESTINAL: Denies nausea, vomiting, diarrhea, abdominal pain.  GENITOURINARY: Denies dysuria or hematuria.  ENDOCRINE: Denies nocturia or thyroid problems.  HEMATOLOGIC AND LYMPHATIC: Denies easy bruising or bleeding.  SKIN: Denies rashes or lesions.  MUSCULOSKELETAL: Denies pain in neck, back, shoulder, knees, hips or arthritic symptoms.  NEUROLOGIC: Denies paralysis, paresthesias.  PSYCHIATRIC: Denies anxiety or depressive symptoms.    Otherwise, full review of systems performed by me is negative.   PAST MEDICAL HISTORY: COPD, chronic respiratory failure requiring 2 liters nasal cannula at baseline, coronary artery disease, diastolic congestive heart failure, ejection fraction 50% to 55%, obstructive sleep apnea, hypertension and chronic atrial fibrillation.   FAMILY HISTORY: Positive for coronary artery disease.   SOCIAL HISTORY: Remote tobacco use. Denies any alcohol or drug usage.   ALLERGIES: No known drug allergies.   HOME MEDICATIONS: Fludrocortisone 0.1 mg p.o. daily as needed for weight gain, aspirin 81 mg p.o. daily, Nitrostat 0.4 mg sublingual every 5 minutes as needed for chest pain, Eliquis 5 mg p.o. b.i.d., sertraline 50 mg 2 tabs p.o. daily, nystatin 100,000 units oral swish/swallow twice daily, Crestor 20 mg p.o. daily, Eligard 45 mg every 6 months, alprazolam 0.5 mg as needed for anxiety, bisoprolol 5 mg p.o. b.i.d., DuoNeb treatments 4 times daily as needed for shortness of breath, Spiriva 18 mcg inhalation daily, Symbicort 160/4.5 mcg inhalation 2 puffs b.i.d., Lasix 20 mg p.o. daily, Mucinex 600 mg b.i.d. as needed for cough, Zantac 150 mg p.o. daily as needed for indigestion, Feosol 325 mg p.o. b.i.d., Colace 100 mg p.o. b.i.d., Senna 8.6 mg p.o. daily, potassium 20 mEq p.o. daily, midodrine 5 mg p.o. 3 times a day as needed for low blood pressure, fish oil 1000 mg p.o. b.i.d., pantoprazole 40 mg p.o. b.i.d.,vitamin C 1000 mg p.o. daily.   PHYSICAL EXAMINATION:  VITAL SIGNS: Temperature 98.3, heart rate 69, respirations 22, blood pressure 104/67, saturating 97% on 3 liters nasal cannula. Weight 97.2 kg, BMI 28.3.  GENERAL: Well-nourished, well-developed, Caucasian gentleman, currently in no acute distress.  HEAD: Normocephalic, atraumatic.  EYES: Pupils equal, round and reactive to light. Extraocular muscles intact. No scleral icterus.  MOUTH: Moist  mucosal membranes. Dentition intact. No abscess noted.   EARS, NOSE, THROAT: Clear without exudates. No external lesions.  NECK: Supple. No thyromegaly. No nodules. No discernible JVD.  PULMONARY: Greatly diminished breath sounds at the bases, more so left versus right, with associated coarse crackles. Otherwise, good air entry bilaterally.  CHEST: Nontender to palpation.  CARDIOVASCULAR: S1 and S2. Irregular rate, irregular rhythm. No murmur, rub or gallop Edema 1+ in the lower extremities despite having compression stockings. Pedal pulses 2+ bilaterally.  GASTROINTESTINAL: Soft, nontender, nondistended. No masses. Positive bowel sounds. No hepatosplenomegaly.  MUSCULOSKELETAL: No swelling, clubbing. Edema positive as described above. Range of motion full in all extremities.  NEUROLOGIC: Cranial nerves II through XII intact. No gross focal neurological deficits. Sensation is intact. Reflexes intact.  SKIN: No ulceration, lesions, rash or cyanosis. Skin warm, dry. Turgor intact.  PSYCHIATRIC: Mood and affect within normal limits. The patient is awake, alert and oriented x 3. Insight and judgment intact.   LABORATORY DATA: EKG performed. Afib without ST or T wave abnormalities. Chest x-ray performed, revealing cardiomegaly with bilateral perihilar infiltrates consistent with pulmonary edema. Sodium 142, potassium 3.7, chloride 107, bicarb 28, BUN 22, creatinine 1.51, glucose 126. BNP 4016. Troponin less than 0.02. , hemoglobin 11.5.   ASSESSMENT AND PLAN: A 79 year old gentleman with history of chronic obstructive pulmonary disease on 2 liters nasal cannula at a baseline, as well as diastolic congestive heart failure, presenting with chest pain and shortness of breath.  1. Acute on chronic diastolic heart failure: Monitor ins and outs, urine output and renal function. Diurese with Lasix. Provide DuoNeb treatments. Supplemental oxygen to keep oxygen saturation greater than 90%. Will consult cardiology. He follows with Dr. Rockey Situ. Also, consult the congestive  heart failure clinic as he has had multiple readmissions for congestive heart failure.  2. Chest pain: Admit to telemetry. Initiate aspirin and statin therapy. Trend cardiac enzymes x 3.  3. Chronic obstructive pulmonary disease, not in acute exacerbation: Breathing treatments as described above. Continue with home medications, including Spiriva and Symbicort, as well as supplemental oxygen.  4. Hyperlipidemia: Statin therapy.  5. Gastroesophageal reflux disease: Proton pump inhibitor therapy.  6. Atrial fibrillation: Continue Eliquis.   7. Venous thromboembolism prophylaxis with Eliquis.   The patient is FULL CODE.   TIME SPENT: 45 minutes.   ____________________________ Aaron Mose. Cason Luffman, MD dkh:gb D: 03/06/2014 21:57:28 ET T: 03/06/2014 23:09:46 ET JOB#: [id number]  cc: Aaron Mose. Akiel Fennell, MD, <Dictator> Alisa Stjames Woodfin Ganja MD ELECTRONICALLY SIGNED 03/07/2014 20:26

## 2014-12-10 NOTE — Consult Note (Signed)
Referring Physician:  Alba Destine :   Primary Care Physician:  Barbette Reichmann :   Reason for Consult: Admit Date: 29-Mar-2014  Chief Complaint: "I went to take a nap and woke up in the hospital"  Reason for Consult: CVA   History of Present Illness: History of Present Illness:   Mr. Wenke is a 79 yo man with PMH notable for atrial fibrillation on Eliquis, prior stroke with residual left hemiparesis, and recent TIAs with left hemiparesis who presented yesterday to [facility] with confusion, dysarthria, and left hemiparesis after waking from a nap. I discussed the case on the phone with the emergency physician at the time of presentation and he was deemed not to be a candidate for IV thrombolysis due to extended time since last known well (wake-up symptoms with LNW > 5 hours prior). The patient remembers only lying down for a nap and then being confused in the emergency room. According to his family, he was discovered lying down on the couch confused, dysarthric, and unable to move his left arm. The patient relates several such episodes over the last several months; he thinks at least 6 or 7 self-limited episodes of left hemiparesis and dysarthria. He was admitted to the hospital for further workup and improved to his baseline within about 4 hours (which includes a prior left hemiparesis).  ROS:  General denies complaints   HEENT no complaints   Lungs no complaints   Cardiac no complaints   GI no complaints   GU no complaints   Musculoskeletal no complaints   Extremities no complaints   Skin no complaints   Neuro left weakness   Endocrine no complaints   Psych anxiety   Past Medical/Surgical Hx:  Remote Tobacco Abuse: a. 1/3 - 1/2 ppd x 50+ yrs.  Quit 2014.  AAA: a. 09/2013 s/p endovascular stenting.  COPD with recurrent Bronchitis: a. On Home O2.  Orthostatic Hypotension and Syncope: a. On florinef;  b. 09/2013 Echo: EF 50-55%.  Permanent Afib: a. CHA2DS2VASc =  7, On eliquis.  Chronic Diastolic CHF: a. 08/6107 Echo: EF 50-55%, mild LVH, low nl RV fxn, mild to mod MR/TR.  Carotid Arterial Dzs: a. 2002 s/p bilat CEA.  TIA/CVA: a. 2002 s/p bilat CEA.  CAD: a. s/p CABG 2007;  b. s/p prior stenting including LM stenting;  c. 06/2013 Myoview: Lat ischemia;  d.  06/2013 Cath: LM stent patent, LIMA->LAD ok, VG->OM3 ok, VG->Diag known to be occluded.  afib:   Nocturnal hypoxia on noct 02:   Anemia, Chronic:   CVA/Stroke:   MI - Myocardial Infarct:   prostate cancer with brachytherapy:   peripheral vascular disease:   HTN:   CAD:   COPD:   Bronchitis:   High cholesterol:   angina:   AAA repair:   CABG (Coronary Artery Bypass Graft):   lasik surgery both eyes:   4 cardiac stents:   Carotid Endarterectomy:   Home Medications: Medication Instructions Last Modified Date/Time  Imdur 30 mg oral tablet, extended release 1 tab(s) orally once a day (at bedtime) 11-Aug-15 17:03  ALPRAZolam 0.5 mg oral tablet 2 tab(s) orally every 6 hours, As Needed - for Anxiety, Nervousness/ chest pain/sob  11-Aug-15 17:03  furosemide 20 mg oral tablet 1 tab(s) orally once a day 11-Aug-15 17:03  Klor-Con M20 oral tablet, extended release 1 tab(s) orally once a day, As Needed when taking lasix 11-Aug-15 17:03  Symbicort 160 mcg-4.5 mcg/inh inhalation aerosol 2 puff(s) inhaled 2 times a day 11-Aug-15 17:03  Eligard 45 mg/6 months subcutaneous injection, extended release 45 milligram(s) subcutaneous every 6 months on the first of April and October 11-Aug-15 17:03  Nitrostat 0.4 mg sublingual tablet 1 tab(s) sublingual every 5 minutes up to 3 doses as needed for chest pain. *if no relief call md or go to emergency room* 11-Aug-15 17:03  fludrocortisone 0.1 mg oral tablet 1 tab(s) orally once a day (in the morning) take only 3 times a week for 5 pounds or more weight gain/ swelling. If no relief, take furosemide with potassium.Marland Kitchen 11-Aug-15 17:03  Crestor 20 mg oral tablet 1 tab(s)  orally once a day (in the evening) 11-Aug-15 17:03  Fish Oil 1000 mg oral capsule 1 cap(s) orally 2 times a day 11-Aug-15 17:03  docusate sodium 100 mg oral tablet 1 tab(s) orally 2 times a day 11-Aug-15 17:03  Ventolin HFA 90 mcg/inh inhalation aerosol 2 puff(s) inhaled every 4 hours, As Needed - for Shortness of Breath 11-Aug-15 17:03  Spiriva 18 mcg inhalation capsule 1 caps via handihaler once a day (in the morning) 11-Aug-15 17:03  Senna 8.6 mg oral tablet 1 tab(s) orally once a day (in the morning) 11-Aug-15 17:03  Mucinex 600 mg oral tablet, extended release 1 tab(s) orally 2 times a day, As Needed for cough/congestion 11-Aug-15 17:03  pantoprazole 40 mg oral delayed release tablet 1 tab(s) orally 2 times a day 11-Aug-15 17:03  midodrine 5 mg oral tablet 1 tab(s) orally 3 times a day for low blood pressure. *blood pressure under 100=no lasix* 11-Aug-15 17:03  bisoprolol 5 mg oral tablet 1 tab(s) orally 2 times a day 11-Aug-15 17:03  nystatin 100000 units/mL oral suspension swish and swallow 5 milliliter(s) orally 2 times a day 11-Aug-15 17:03  Robitussin DM To Go 20 mg-200 mg/10 mL oral liquid 10 milliliter(s) orally every 4 hours, As Needed for cough 11-Aug-15 17:03  ferrous sulfate 325 mg oral tablet 1 tab(s) orally 2 times a day 11-Aug-15 17:03  Aspirin Enteric Coated 81 mg oral delayed release tablet 1 tab(s) orally once a day (in the morning) 11-Aug-15 17:03  Eliquis 5 mg oral tablet 1 tab(s) orally 2 times a day 11-Aug-15 17:03  Zantac 150 150 mg oral tablet 1 tab(s) orally once a day, As Needed - for Indigestion, Heartburn 11-Aug-15 17:03  sertraline 50 mg oral tablet 2 tab(s) orally once a day (in the morning) 11-Aug-15 17:03  albuterol-ipratropium 1 vial(s) inhaled 4 times a day, As Needed 11-Aug-15 17:03   Allergies:  No Known Allergies:   Social/Family History: Social History: Lives at home with his wife. Quit smoking last year. Denies EtOH or illicit drug use.  Family  History: Mother had a history of MI. No known FH of strokes.   Vital Signs: **Vital Signs.:   12-Aug-15 11:07  Vital Signs Type Routine  Temperature Temperature (F) 98.1  Celsius 36.7  Temperature Source oral  Pulse Pulse 64  Respirations Respirations 19  Systolic BP Systolic BP 177  Diastolic BP (mmHg) Diastolic BP (mmHg) 68  Mean BP 81  Pulse Ox % Pulse Ox % 97  Pulse Ox Activity Level  At rest  Oxygen Delivery 2L   EXAM: Well-developed, well-nourished, in NAD. No conjunctival injection or scleral edema. Oropharynx clear. No carotid bruits auscultated. Normal S1, S2 and regular cardiac rhythm on exam. Lungs clear to auscultation bilaterally. Abdomen soft and nontender. Peripheral pulses palpated. No clubbing, cyanosis, or edema in extremities.  MENTAL STATUS: Alert and oriented to person, place, and time. Language fluent and  appropriate. Cognition and memory conversationally intact. CRANIAL NERVES: Visual fields full to confrontation. PERRL. EOMI. Facial sensation intact. Mild left facial weakness present. Hearing intact to finger rub. Uvula midline with symmetric palatal elevation. Tongue midline without fasciculations. MOTOR: Normal bulk and tone. Strength 4/5 throughout left arm and leg with slight drift in both; 5/5 throughout right arm and leg. REFLEXES: 2+ in biceps, triceps, patella, and achilles bilaterally. Equivocal plantar responses bilaterally. SENSORY: Intact to vibration and pinprick throughout without extinction. COORDINATION: No ataxia or dysmetria on finger-nose or heel-shin maneuvers. GAIT: Not tested  NIHSS Score: 3 (1 for left facial weakness, 1 for left arm drift, 1 for left leg drift) ABCD2 TIA Risk score: 6 (8.1% 2-day stroke risk).  Lab Results: LabObservation:  12-Aug-15 10:23   OBSERVATION Reason for Test  Hepatic:  11-Aug-15 14:49   Bilirubin, Total 0.4  Alkaline Phosphatase 80 (46-116 NOTE: New Reference Range 03/08/14)  SGPT (ALT) 15  (14-63 NOTE: New Reference Range 03/08/14)  SGOT (AST) 25  Total Protein, Serum 7.7  Albumin, Serum  3.1  Routine Chem:  11-Aug-15 14:49   B-Type Natriuretic Peptide [facility])  3113 (Result(s) reported on 29 Mar 2014 at 03:22PM.)  Glucose, Serum 97  BUN  19  Creatinine (comp)  1.38  Sodium, Serum 144  Potassium, Serum 3.7  Chloride, Serum 107  CO2, Serum 29  Calcium (Total), Serum 8.7  Osmolality (calc) 289  eGFR (African American)  57  eGFR (Non-African American)  49 (eGFR values <30m/min/1.73 m2 may be an indication of chronic kidney disease (CKD). Calculated eGFR is useful in patients with stable renal function. The eGFR calculation will not be reliable in acutely ill patients when serum creatinine is changing rapidly. It is not useful in  patients on dialysis. The eGFR calculation may not be applicable to patients at the low and high extremes of body sizes, pregnant women, and vegetarians.)  Anion Gap 8  Cardiac:  11-Aug-15 14:49   Troponin I < 0.02 (0.00-0.05 0.05 ng/mL or less: NEGATIVE  Repeat testing in 3-6 hrs  if clinically indicated. >0.05 ng/mL: POTENTIAL  MYOCARDIAL INJURY. Repeat  testing in 3-6 hrs if  clinically indicated. NOTE: An increase or decrease  of 30% or more on serial  testing suggests a  clinically important change)  Routine UA:  11-Aug-15 15:32   Color (UA) Yellow  Clarity (UA) Clear  Glucose (UA) Negative  Bilirubin (UA) Negative  Ketones (UA) Negative  Specific Gravity (UA) 1.013  Blood (UA) Negative  pH (UA) 5.0  Protein (UA) Negative  Nitrite (UA) Negative  Leukocyte Esterase (UA) Negative (Result(s) reported on 29 Mar 2014 at 04:00PM.)  RBC (UA) NONE SEEN  WBC (UA) 1 /HPF  Bacteria (UA) NONE SEEN  Epithelial Cells (UA) NONE SEEN  Hyaline Cast (UA) 2 /LPF (Result(s) reported on 29 Mar 2014 at 04:00PM.)  Routine Coag:  11-Aug-15 14:49   Prothrombin  15.8  INR 1.3 (INR reference interval applies to patients on anticoagulant  therapy. A single INR therapeutic range for coumarins is not optimal for all indications; however, the suggested range for most indications is 2.0 - 3.0. Exceptions to the INR Reference Range may include: Prosthetic heart valves, acute myocardial infarction, prevention of myocardial infarction, and combinations of aspirin and anticoagulant. The need for a higher or lower target INR must be assessed individually. Reference: The Pharmacology and Management of the Vitamin K  antagonists: the seventh ACCP Conference on Antithrombotic and Thrombolytic Therapy. CQJJHE.1740Sept:126 (3suppl): [id number] A HCT value >  55% may artifactually increase the PT.  In one study,  the increase was an average of 25%. Reference:  "Effect on Routine and Special Coagulation Testing Values of Citrate Anticoagulant Adjustment in Patients with High HCT Values." American Journal of Clinical Pathology 2006;126:400-405.)  Routine Hem:  11-Aug-15 14:49   WBC (CBC) 8.6  RBC (CBC)  3.92  Hemoglobin (CBC)  11.7  Hematocrit (CBC)  36.0  Platelet Count (CBC) 167 (Result(s) reported on 29 Mar 2014 at 03:07PM.)  MCV 92  MCH 29.8  MCHC 32.5  RDW 13.1   Radiology Results: MRI:    12-Aug-15 09:53, MRI Brain Without Contrast  MRI Brain Without Contrast   REASON FOR EXAM:    slurred speech  COMMENTS:       PROCEDURE: MR  - MR BRAIN WO CONTRAST  - Mar 30 2014  9:53AM     CLINICAL DATA:  Extreme fatigue with memory loss and altered mental  status.    EXAM:  MRI HEAD WITHOUT CONTRAST    TECHNIQUE:  Multiplanar, multiecho pulse sequences of the brain and surrounding  structures were obtained without intravenous contrast.  COMPARISON:  CT head 03/29/2014.  MR head 08/15/2013.    FINDINGS:  No evidence for acute infarction, hemorrhage, mass lesion,  hydrocephalus, or extra-axial fluid. Generalized atrophy. Mild to  moderate subcortical and periventricular T2 and FLAIR  hyperintensities, likely chronic  microvascular ischemic change. Flow  voids are maintained throughout the carotid, basilar, and vertebral  arteries.    There are no areas of chronic hemorrhage. Pituitary, pineal, and  cerebellar tonsils unremarkable. No upper cervical lesions.  Visualized calvarium, skull base, and upper cervical osseous  structures unremarkable. Scalp and extracranial soft tissues,  orbits, sinuses, and mastoids show no acute process.  Compared with priors, similar appearance is noted.     IMPRESSION:  Chronic changes as described.  No acute intracranial abnormality.      Electronically Signed    By: Rolla Flatten M.D.    On: 03/30/2014 10:15         Verified By: Staci Righter, M.D.,  CT:    11-Aug-15 15:28, CT Head Without Contrast  CT Head Without Contrast   REASON FOR EXAM:    altered mental status  COMMENTS:   May transport without cardiac monitor    PROCEDURE: CT  - CT HEAD WITHOUT CONTRAST  - Mar 29 2014  3:28PM     CLINICAL DATA:  Altered mental status    EXAM:  CT HEAD WITHOUT CONTRAST    TECHNIQUE:  Contiguous axial images were obtained from the base of the skull  through the vertex without intravenous contrast.    COMPARISON:  Jan 06, 2014  FINDINGS:  There is stable moderate generalized atrophy. There is no mass,  hemorrhage, extra-axial fluid collection, or midline shift. There is  patchy small vessel disease in the centra semiovale bilaterally,  stable. No new gray-white compartment lesion is identified. No acute  infarct apparent. The bony calvarium appears intact. The mastoid air  cells are clear. There is rightward deviation of the nasal septum.     IMPRESSION:  Atrophy with patchy periventricular small vessel disease, stable. No  intracranial mass, hemorrhage, or acute appearing infarct. Stable  rightward deviation of the nasalseptum.      Electronically Signed    By: Lowella Grip M.D.    On: 03/29/2014 15:34         Verified By: Leafy Kindle. Jasmine December,  M.D.,   Impression/Recommendations:  Recommendations:   Mr. Arismendez is a 79 yo man with a history of prior strokes and atrial fibrillation (on anticoagulation) who presents with a prolonged episode of confusion, dysarthria, and left hemiparesis that resolved after several hours. He also describes several nearly identical (though shorter-lasting) episodes over the last several months. His neurologic exam is significant for mild left hemiparesis with normal sensation, which he says is residual from a prior stroke. Brain MRI this morning did not show any new area of infarct. am concerned that the stereotyped nature of the recurrent TIAs may implicate an intracranial vascular process rather than cardioembolic source of stroke. Another possible cause is seizure originating from the previously damaged area of cortex.  Routine EEG to evaluate for right-sided epileptiform discharges indicative of seizure tendency. I expect to see slowing due to the previous stroke, but sharp waves or PLEDs may merit treatment with an AEDs such as levetiracetam in case these episodes are seizures rather than TIAsIntracranial vessel imaging with either CTA with contrast or MRA without contrast to evaluate for severe intracranial stenosis as a source for these TIAs. May need to consider weighing risk of adding an antiplatelet to his anticoagulation if this is foundChange Crestor to Lipitor 47m daily for secondary stroke risk reduction (SPARCL trial) you for the opportunity to participate in Mr. SDunnecare. Please page neurology consults with further questions.  Electronic Signatures: HCarmin Richmond(MD)  (Signed 12-Aug-15 12:59)  Authored: REFERRING PHYSICIAN, Primary Care Physician, Consult, History of Present Illness, Review of Systems, PAST MEDICAL/SURGICAL HISTORY, HOME MEDICATIONS, ALLERGIES, Social/Family History, NURSING VITAL SIGNS, Physical Exam-, LAB RESULTS, RADIOLOGY RESULTS, Recommendations   Last Updated:  12-Aug-15 12:59 by HCarmin Richmond(MD)

## 2014-12-10 NOTE — H&P (Signed)
PATIENT NAME:  Javier [name], Javier [name] MR#:  [id number] DATE OF BIRTH:  1935/12/26  DATE OF ADMISSION:  10/08/2013  PRIMARY CARE PROVIDER: Francee Nodal, MD  EMERGENCY DEPARTMENT REFERRING PHYSICIAN: Conni Slipper, MD  CHIEF COMPLAINT: Left arm weakness, left leg weakness, abdominal pain, left leg pain.   HISTORY OF PRESENT ILLNESS: The patient is a 79 year old well known to our service with numerous admissions for multiple complaints. Last admission was in February. He was discharged on February 8. Admitted with COPD exacerbation. He states that he started having left arm and left leg weakness, which started in the afternoon and it was feeling heavy. Now his symptoms have improved. He also states that at the same time he started having abdominal pain and left leg pain. The ED physician did a CT scan of his abdomen, and the patient is noted to have an infrarenal abdominal aortic aneurysm with maximal diameter of 5.7 cm. He spoke to Dr. Lucky Cowboy, who recommended the patient be admitted. He will see the patient for possible surgical intervention. The patient otherwise denies any fevers, chills. No chest pain. He has chronic intermittent shortness of breath. He uses oxygen as needed and then at nighttime.   PAST MEDICAL HISTORY:  1.  History of diastolic CHF. 2.  History of COPD.  3.  Chronic atrial fibrillation, for which he is on Coumadin. 4.  History of hypertension. 5.  History of prostate cancer.  6.  History of coronary artery disease, status post coronary artery bypass graft in 2008, multiple stents.  7.  Carotid endarterectomy.  8.  TIA.  9.  History of peripheral vascular disease.  10.  Morbid obesity. 11.  Obstructive sleep apnea. 12.  History of CVA requiring tPA.   PAST SURGICAL HISTORY: Status post CABG, carotid endarterectomy, cardiac stents, prostate surgery, and Lasik surgery.   ALLERGIES: To no medications.   MEDICATIONS:  List personally reviewed. Aspirin 81 mg 1 tab p.o. daily,  Prilosec 20 daily; fludrocortisone 0.1 mg is given as p.r.n., only take 3 times a week if he gains more than 5 pounds; senna 1 tab p.o. daily, Zoloft 50 daily, KCl 10 mEq 1 tab p.o. daily, metoprolol tartrate 1 tab p.o. b.i.d., iron 65 mg 1 tab p.o. b.i.d., montelukast 10 daily, vitamin C 1000 mg with ascorbic acid 1 mg daily, Crestor 20 daily; warfarin 3 mg alternating with 1.5 mg, except on Saturday he takes 1.5 mg and Sunday he also takes 1.5 mg; fish oil 1000 mg 1 tab p.o. b.i.d.,  stool softeners/Colace 1 tab p.o. b.i.d., Spiriva HandiHaler inhaler daily, Symbicort 2 puffs b.i.d., Lasix 20 mg 1 tab p.o. daily, alprazolam 0.25 mg 1 tab p.o. q.6 p.r.n., Eligard 45 mg every 6 months, Mucinex 600 mg 1 tab p.o. b.i.d., Ventolin 1 puff as needed, ipratropium/albuterol as needed for shortness of breath, Robitussin q.4 hours p.r.n., (Dictation Anomaly)  0.25 mL q.4 p.r.n. for pain, tramadol/APAP 37.5 one tab p.o. b.i.d. as needed for joint pain, hydrocodone/acetaminophen 1 tab p.o. q.4 hours p.r.n. for pain, codeine/guaifenesin 10 mL q.4 p.r.n. cough, Nitrostat 0.4 as needed.   SOCIAL HISTORY: History of smoking previously. He does not smoke anymore. No alcohol or drug use.   FAMILY HISTORY: Father died from complications of throat and esophageal cancers.   REVIEW OF SYSTEMS:    CONSTITUTIONAL: Denies any fevers, chills. No weight loss. No weight gain.  HEENT: Denies any blurred vision. No double vision. No erythema. No glaucoma. No cataracts. No tinnitus. No seasonal allergies. No difficulty  swallowing. Denies any hearing loss.  CARDIOVASCULAR: Denies any chest pain. Complains of shortness of breath. No palpitations. No syncope.  PULMONARY: Denies any cough, wheezing, hemoptysis.  GASTROINTESTINAL: Denies any nausea, vomiting, diarrhea.  GENITOURINARY: Denies any frequency, urgency, or hesitancy.  ENDOCRINE: Denies any polyuria or polydipsia.  SKIN: Denies any skin rash.  PSYCHIATRIC: Denies any anxiety  or depression.  NEUROLOGIC: History of CVA.  LYMPHATICS: Denies any lymph node enlargements.   PHYSICAL EXAMINATION: VITAL SIGNS: Temperature 98.5, pulse 86, respirations 18, blood pressure 156/88.  GENERAL: The patient is a well-developed male, chronically ill.  HEENT: Head atraumatic, normocephalic. Pupils equally round, reactive to light and accommodation. There is no conjunctival pallor. No scleral icterus. Nasal exam shows no drainage or ulceration. Oropharynx is clear.  NECK: Supple without any JVD. No thyroid enlargement or tenderness.  LUNGS: Clear to auscultation bilaterally without any rales, rhonchi, or wheezing.  CARDIOVASCULAR: S1, S2 positive. No murmurs, rubs, clicks, or gallops.  ABDOMEN: Soft. There is tenderness in the epigastric region. No guarding, no rebound.  EXTREMITIES: No clubbing, cyanosis, or edema.  SKIN: No rash.  LYMPHATICS: No lymph nodes palpable.  VASCULAR: Good DP, PT pulses.  PSYCHIATRIC: Awake, alert, oriented x 3, not depressed or anxious.  MUSCULOSKELETAL: There is no erythema or swelling.  NEUROLOGICAL: Cranial nerves II through XII grossly intact. Strength is intact.   LABORATORY, DIAGNOSTIC, AND RADIOLOGICAL DATA: EKG shows atrial fibrillation. CT of the abdomen without contrast shows infrarenal abdominal aortic aneurysm with maximal diameter of 5.7 cm. There is no perianeurysmal leakage of blood. There is no evidence of acute hepatobiliary or acute urinary tract abnormality. There is no evidence of bowel obstruction or ileus. There is a moderate-sized right pleural effusion and trace left pleural effusion. CT scan of the head without contrast shows no acute intracranial processes.   ASSESSMENT AND PLAN: The patient is a 79 year old white male with multiple admissions for different complaints, comes in with left-sided weakness, now improved. Also has abdominal pain and left leg pain.  1.  Possible transient ischemic attack: On aspirin and Coumadin  already. Will continue same. No changes to the medications. Will have neurology come see the patient. Will get carotid Dopplers and echocardiogram of the heart.  2.  Abdominal pain with abdominal aortic aneurysm: ED MD spoke to Dr. Delana Meyer of vascular, who recommends inpatient admission and vascular evaluation for possible repair. If there is an aneurysm repair, then we will have to ask cardiology for preoperative clearance, which is Dr. Rockey Situ. 3.  Atrial fibrillation/atrial flutter: Continue Coumadin and metoprolol.  4.  Chronic obstructive pulmonary disease: Continue Spiriva, Symbicort, p.r.n. nebulizers.  5.  Hyperlipidemia: Continue Crestor.  6.  History of diastolic congestive heart failure: Currently appears compensated. Will hold off on Lasix.  7.  Miscellaneous: The patient is already on Coumadin. This should be sufficient for deep vein thrombosis prophylaxis.   TIME SPENT ON THIS PATIENT: 55 minutes.    ____________________________ Lafonda Mosses Posey Pronto, MD shp:jcm D: 10/08/2013 19:04:45 ET T: 10/08/2013 19:57:00 ET JOB#: [id number]  cc: Gardenia Witter H. Posey Pronto, MD, <Dictator> Alric Seton MD ELECTRONICALLY SIGNED 10/22/2013 17:59

## 2014-12-10 NOTE — Discharge Summary (Signed)
Dates of Admission and Diagnosis:  Date of Admission 29-Mar-2014   Date of Discharge 31-Mar-2014   Admitting Diagnosis TIA   Final Diagnosis Episode of confusion- Likely TIA Vs seizures- recurrent episodes- without any sinificant findings on repeated work ups. Ch Diastolic CHF HTn Hyperlipidemia Ch Afib    Chief Complaint/History of Present Illness A 79 year old Caucasian male patient with multiple comorbidities including congestive heart failure, CAD, hypertension, multiple TIAs presents to the Emergency Room, brought in by family after they noticed that patient, on waking up from sleep, was extremely confused, unable to recognize them, had some slurred speech, and also worse left-sided weakness.   Patient supposedly tends to have on-and-off episodes of confusion with lots of thoughts, maybe lasting a few seconds, but this lasted a couple of hours until he presented to the Emergency Room today. He also had some worse left weakness. The patient, from prior stroke, had residual left weakness, which is worse today.  Presently, the patient feels back to baseline, but considering his risk factors, he is being admitted for a TIA work-up to the hospital under observation.   The patient has had significant weakness working with physical therapy at home.   Allergies:  No Known Allergies:   Hepatic:  11-Aug-15 14:49   Bilirubin, Total 0.4  Alkaline Phosphatase 80 (46-116 NOTE: New Reference Range 03/08/14)  SGPT (ALT) 15 (14-63 NOTE: New Reference Range 03/08/14)  SGOT (AST) 25  Total Protein, Serum 7.7  Albumin, Serum  3.1  Cardiology:  12-Aug-15 10:07   Echo Doppler REASON FOR EXAM:     COMMENTS:     PROCEDURE: [facility] - ECHO DOPPLER COMPLETE(TRANSTHOR)  - Mar 30 2014 10:07AM   RESULT: Echocardiogram Report  Patient Name:   Javier [name]. Date of Exam: 03/30/2014 Medical Rec #:  [id number]  Custom1: Date of Birth:  [birth year]                Height:       72.0 in Patient Age:     46 years                Weight:       207.2 lb Patient Gender: M                       BSA:          2.16 m??  Indications: CVA Sonographer:    Sherrie Sport RDCS Referring Phys: Graciella Freer, H  Summary:  1. No source of TIA or CVA noted. Rhythm is atrial fibrillation.  2. Left ventricular ejection fraction, by visual estimation, is 55 to  60%.  3. Normal global left ventricular systolic function.  4. Normal right ventricular size and systolic function.  5. Moderately dilated left atrium.  6. Mild to moderate mitral valve regurgitation.  7. Mild tricuspid regurgitation.  8. RVSP is high normal. 2D AND M-MODE MEASUREMENTS (normal ranges within parentheses): Left Ventricle:          Normal IVSd (2D):      1.45 cm (0.7-1.1) LVPWd (2D):     1.08 cm (0.7-1.1) Aorta/LA:                  Normal LVIDd (2D):     4.11 cm (3.4-5.7) Aortic Root (2D): 3.40 cm (2.4-3.7) LVIDs (2D):     2.86 cm           Left Atrium (2D): 5.10 cm (1.9-4.0) LV FS (2D):  30.4 %   (>25%) LV EF (2D):     58.3 %   (>50%)                                   Right Ventricle:                                   RVd (2D):        6.44 cm LV DIASTOLIC FUNCTION: MV Peak E: 1.48 m/s E/e' Ratio: 13.30                     Decel Time: 236 msec SPECTRAL DOPPLER ANALYSIS (where applicable): Mitral Valve: MV P1/2 Time: 68.44 msec MV Area, PHT: 3.21 cm?? Aortic Valve: AoV Max Vel: 1.44 m/s AoV Peak PG: 8.3 mmHg AoV Mean PG:   5.0 mmHg LVOT Vmax: 1.26 m/s LVOT VTI: 0.257 m LVOT Diameter: 2.20 cm AoV Area, Vmax: 3.33 cm?? AoV Area, VTI: 3.68 cm?? AoV Area, Vmn: 3.61 cm?? Tricuspid Valve and PA/RV Systolic Pressure: TR Max Velocity: 2.88 m/s RA  Pressure: 5 mmHg RVSP/PASP: 38.1 mmHg Pulmonic Valve: PV Max Velocity: 0.94 m/s PV Max PG: 3.5 mmHg PV Mean PG:  PHYSICIAN INTERPRETATION: Left Ventricle: The left ventricular internal cavity size was normal. LV  posterior wall thickness was normal. No leftventricular hypertrophy.   Global LV systolic function was normal. Left ventricular ejection  fraction, by visual estimation, is 55 to 60%. Right Ventricle: Normal right ventricular size, wall thickness, and  systolic function. The right ventricular size is normal. Global RV   systolic function is normal. Left Atrium: The left atrium is moderately dilated. Right Atrium: The right atrium is normal in size. Pericardium: There is no evidence of pericardial effusion. Mitral Valve: The mitralvalve is normal in structure. Mild to moderate  mitral valve regurgitation is seen. Tricuspid Valve: The tricuspid valve is normal. Mild tricuspid  regurgitation is visualized. The tricuspid regurgitant velocity is 2.88  m/s, and with an assumed right atrial pressure of 5 mmHg, the estimated  right ventricular systolic pressure is normal at 38.1 mmHg. Aortic Valve: The aortic valve was not well seen. Mild to moderate aortic  valve sclerosis/calcification is present, without any evidence ofaortic  stenosis. No evidence of aortic valve regurgitation is seen. Pulmonic Valve: The pulmonic valve is not well seen. The pulmonic valve  is normal. Trace pulmonic valve regurgitation. Aorta: The aortic root and ascending aorta are structurally normal, with  no evidence of dilitation.  [id number] Ida Rogue MD Electronically signed by [id number] Ida Rogue MD Signature Date/Time: 03/30/2014/6:00:43 PM  *** Final ***  IMPRESSION: .    Verified By: Minna Merritts, M.D., MD  Routine Chem:  11-Aug-15 14:49   B-Type Natriuretic Peptide [facility])  3113 (Result(s) reported on 29 Mar 2014 at 03:22PM.)  Glucose, Serum 97  BUN  19  Creatinine (comp)  1.38  Sodium, Serum 144  Potassium, Serum 3.7  Chloride, Serum 107  CO2, Serum 29  Calcium (Total), Serum 8.7  Osmolality (calc) 289  eGFR (African American)  57  eGFR (Non-African American)  49 (eGFR values <47m/min/1.73 m2 may be an indication of chronic kidney disease  (CKD). Calculated eGFR is useful in patients with stable renal function. The eGFR calculation will not be reliable in acutely ill patients when serum creatinine is changing rapidly. It is not  useful in  patients on dialysis. The eGFR calculation may not be applicable to patients at the low and high extremes of body sizes, pregnant women, and vegetarians.)  Anion Gap 8  Cardiac:  11-Aug-15 14:49   Troponin I < 0.02 (0.00-0.05 0.05 ng/mL or less: NEGATIVE  Repeat testing in 3-6 hrs  if clinically indicated. >0.05 ng/mL: POTENTIAL  MYOCARDIAL INJURY. Repeat  testing in 3-6 hrs if  clinically indicated. NOTE: An increase or decrease  of 30% or more on serial  testing suggests a  clinically important change)  Routine UA:  11-Aug-15 15:32   Color (UA) Yellow  Clarity (UA) Clear  Glucose (UA) Negative  Bilirubin (UA) Negative  Ketones (UA) Negative  Specific Gravity (UA) 1.013  Blood (UA) Negative  pH (UA) 5.0  Protein (UA) Negative  Nitrite (UA) Negative  Leukocyte Esterase (UA) Negative (Result(s) reported on 29 Mar 2014 at 04:00PM.)  RBC (UA) NONE SEEN  WBC (UA) 1 /HPF  Bacteria (UA) NONE SEEN  Epithelial Cells (UA) NONE SEEN  Hyaline Cast (UA) 2 /LPF (Result(s) reported on 29 Mar 2014 at 04:00PM.)  Routine Coag:  11-Aug-15 14:49   Prothrombin  15.8  INR 1.3 (INR reference interval applies to patients on anticoagulant therapy. A single INR therapeutic range for coumarins is not optimal for all indications; however, the suggested range for most indications is 2.0 - 3.0. Exceptions to the INR Reference Range may include: Prosthetic heart valves, acute myocardial infarction, prevention of myocardial infarction, and combinations of aspirin and anticoagulant. The need for a higher or lower target INR must be assessed individually. Reference: The Pharmacology and Management of the Vitamin K  antagonists: the seventh ACCP Conference on Antithrombotic and Thrombolytic  Therapy. DHRCB.6384 Sept:126 (3suppl): [id number]. A HCT value >55% may artifactually increase the PT.  In one study,  the increase was an average of 25%. Reference:  "Effect on Routine and Special Coagulation Testing Values of Citrate Anticoagulant Adjustment in Patients with High HCT Values." American Journal of Clinical Pathology 2006;126:400-405.)  Routine Hem:  11-Aug-15 14:49   WBC (CBC) 8.6  RBC (CBC)  3.92  Hemoglobin (CBC)  11.7  Hematocrit (CBC)  36.0  Platelet Count (CBC) 167 (Result(s) reported on 29 Mar 2014 at 03:07PM.)  MCV 92  MCH 29.8  MCHC 32.5  RDW 13.1   PERTINENT RADIOLOGY STUDIES: LabUnknown:    11-Aug-15 15:28, CT Head Without Contrast  PACS Image     12-Aug-15 09:53, MRI Brain Without Contrast  PACS Image     12-Aug-15 17:16, MRA Brain Without Contrast  PACS Image   MRI:    12-Aug-15 09:53, MRI Brain Without Contrast  MRI Brain Without Contrast   REASON FOR EXAM:    slurred speech  COMMENTS:       PROCEDURE: MR  - MR BRAIN WO CONTRAST  - Mar 30 2014  9:53AM     CLINICAL DATA:  Extreme fatigue with memory loss and altered mental  status.    EXAM:  MRI HEAD WITHOUT CONTRAST    TECHNIQUE:  Multiplanar, multiecho pulse sequences of the brain and surrounding  structures were obtained without intravenous contrast.  COMPARISON:  CT head 03/29/2014.  MR head 08/15/2013.    FINDINGS:  No evidence for acute infarction, hemorrhage, mass lesion,  hydrocephalus, or extra-axial fluid. Generalized atrophy. Mild to  moderate subcortical and periventricular T2 and FLAIR  hyperintensities, likely chronic microvascular ischemic change. Flow  voids are maintained throughout the carotid, basilar, and vertebral  arteries.  There are no areas of chronic hemorrhage. Pituitary, pineal, and  cerebellar tonsils unremarkable. No upper cervical lesions.  Visualized calvarium, skull base, and upper cervical osseous  structures unremarkable. Scalp and extracranial  soft tissues,  orbits, sinuses, and mastoids show no acute process.  Compared with priors, similar appearance is noted.     IMPRESSION:  Chronic changes as described.  No acute intracranial abnormality.      Electronically Signed    By: Rolla Flatten M.D.    On: 03/30/2014 10:15         Verified By: Staci Righter, M.D.,    12-Aug-15 17:16, MRA Brain Without Contrast  MRA Brain Without Contrast   REASON FOR EXAM:    Look for intra cranial vessel obstruction- as have   recurrent similar TIA episode  COMMENTS:       PROCEDURE: MR  - MRA BRAIN WO CONTRAST  - Mar 30 2014  5:16PM     CLINICAL DATA:  Recurrent TIAs.    EXAM:  MRA HEAD WITHOUT CONTRAST    TECHNIQUE:  Angiographic images of the Circle of Willis were obtained using MRA  technique without intravenous contrast.  COMPARISON:  Routine brain MRI 03/30/2014    FINDINGS:  Visualized distal vertebral arteries are patent with the right being  mildly dominant. Right PICA origin is patent. Left PICA origin is  not imaged. AICA and SCA origins are patent. Basilar artery is  patent without stenosis. There are small bilateral posterior  communicating arteries. Proximal PCAs are unremarkable. Apparent  narrowing of the left greater than right distal P2 segments is  favored to be artifactual due to motion.    Internal carotid arteries are patent from skullbase to carotid  termini. Focally diminished flow related enhancement in proximal  left M2 branches is favored to be artifact, with otherwise  unremarkable appearance of the MCAs and ACAs. Anterior communicating  artery is patent. No intracranial aneurysm is identified.     IMPRESSION:  Unremarkable head MRA allowing for mild motion artifact. No major  intracranial arterial occlusion or proximal stenosis.      Electronically Signed    By: Logan Bores    On: 03/30/2014 17:25         Verified By: Ferol Luz, M.D.,  CT:    11-Aug-15 15:28, CT Head Without  Contrast  CT Head Without Contrast   REASON FOR EXAM:    altered mental status  COMMENTS:   May transport without cardiac monitor    PROCEDURE: CT  - CT HEAD WITHOUT CONTRAST  - Mar 29 2014  3:28PM     CLINICAL DATA:  Altered mental status    EXAM:  CT HEAD WITHOUT CONTRAST    TECHNIQUE:  Contiguous axial images were obtained from the base of the skull  through the vertex without intravenous contrast.    COMPARISON:  Jan 06, 2014  FINDINGS:  There is stable moderate generalized atrophy. There is no mass,  hemorrhage, extra-axial fluid collection, or midline shift. There is  patchy small vessel disease in the centra semiovale bilaterally,  stable. No new gray-white compartment lesion is identified. No acute  infarct apparent. The bony calvarium appears intact. The mastoid air  cells are clear. There is rightward deviation of the nasal septum.     IMPRESSION:  Atrophy with patchy periventricular small vessel disease, stable. No  intracranial mass, hemorrhage, or acute appearing infarct. Stable  rightward deviation of the nasalseptum.      Electronically  Signed    By: Lowella Grip M.D.    On: 03/29/2014 15:34         Verified By: Leafy Kindle. WOODRUFF, M.D.,   Pertinent Past History:  Pertinent Past History 1.  Chronic obstructive pulmonary disease and chronic respiratory failure on 2 liters oxygen.  2.  CAD.  3.  Chronic diastolic CHF.  4.  Peripheral vascular disease.  5.  Obstructive sleep apnea.  6.  Hypertension. 7.  TIAs.  8.  CVA, status post TPA 1 year prior.  9.  Prostate cancer.  10.  Chronic atrial fibrillation.  11.  Chronic anticoagulation.   Hospital Course:  Hospital Course 1.  Acute confusion, slurred speech, and worsening left-sided weakness in a patient with atrial fibrillation, prior endarterectomy, and history of strokes.  likely transient ischemic attack. The patient has had multiple episodes of transient ischemic attacks in the past.   back to baseline. negative echo and MRI. had a carotid Doppler earlier this year, will not repeat this. He had less than 50% stenosis on that Doppler.   on aspirin and statin.  on Eliquis for his atrial fibrillation.  appreciated  consult neurology for further input with the case. suggest Brain angiogram and EEG- MRA negative, EEG as out pt with clinic.  2.  Hypertension. Continue home medications.  3.  Chronic atrial fibrillation on Eliquis. Continue rate control medications along with Eliquis.  4.  Chronic diastolic congestive heart failure. No crackles on examination and no signs of fluid overload. No pulmonary edema on the chest x-ray. Continue home dose of Lasix and monitor. d/c today home. resume services with [facility].   Condition on Discharge Stable   Code Status:  Code Status Full Code   DISCHARGE INSTRUCTIONS HOME MEDS:  Medication Reconciliation: Patient's Home Medications at Discharge:     Medication Instructions  symbicort 160 mcg-4.5 mcg/inh inhalation aerosol  2 puff(s) inhaled 2 times a day   eligard 45 mg/6 months subcutaneous injection, extended release  45 milligram(s) subcutaneous every 6 months on the first of April and October   ventolin hfa 90 mcg/inh inhalation aerosol  2 puff(s) inhaled every 4 hours, As Needed - for Shortness of Breath   nitrostat 0.4 mg sublingual tablet  1 tab(s) sublingual every 5 minutes up to 3 doses as needed for chest pain. *if no relief call md or go to emergency room*   fludrocortisone 0.1 mg oral tablet  1 tab(s) orally once a day (in the morning) take only 3 times a week for 5 pounds or more weight gain/ swelling. If no relief, take furosemide with potassium..   crestor 20 mg oral tablet  1 tab(s) orally once a day (in the evening)   fish oil 1000 mg oral capsule  1 cap(s) orally 2 times a day   docusate sodium 100 mg oral tablet  1 tab(s) orally 2 times a day   spiriva 18 mcg inhalation capsule  1 caps via handihaler once a  day (in the morning)   senna 8.6 mg oral tablet  1 tab(s) orally once a day (in the morning)   mucinex 600 mg oral tablet, extended release  1 tab(s) orally 2 times a day, As Needed for cough/congestion   klor-con m20 oral tablet, extended release  1 tab(s) orally once a day, As Needed when taking lasix   pantoprazole 40 mg oral delayed release tablet  1 tab(s) orally 2 times a day   bisoprolol 5 mg oral tablet  1 tab(s) orally 2 times a day   nystatin 100000 units/ml oral suspension  swish and swallow 5 milliliter(s) orally 2 times a day   robitussin dm to go 20 mg-200 mg/10 ml oral liquid  10 milliliter(s) orally every 4 hours, As Needed for cough   ferrous sulfate 325 mg oral tablet  1 tab(s) orally 2 times a day   aspirin enteric coated 81 mg oral delayed release tablet  1 tab(s) orally once a day (in the morning)   eliquis 5 mg oral tablet  1 tab(s) orally 2 times a day   zantac 150 150 mg oral tablet  1 tab(s) orally once a day, As Needed - for Indigestion, Heartburn   furosemide 20 mg oral tablet  1 tab(s) orally once a day   sertraline 50 mg oral tablet  2 tab(s) orally once a day (in the morning)   albuterol-ipratropium  1 vial(s) inhaled 4 times a day, As Needed   alprazolam 0.5 mg oral tablet  2 tab(s) orally every 6 hours, As Needed - for Anxiety, Nervousness/ chest pain/sob    imdur 30 mg oral tablet, extended release  1 tab(s) orally once a day (at bedtime)   midodrine 5 mg oral tablet  1 tab(s) orally 3 times a day as needed for low blood pressure. *blood pressure under 100=no lasix*, or imdur   promethazine 12.5 mg oral tablet  1 tab(s) orally every 6 hours, As Needed, nausea , As needed, nausea     Physician's Instructions:  Home Health? Yes   [facility]  Nurse  resume services.   Diet Low Sodium  Low Fat, Low Cholesterol   Activity Limitations As tolerated   Return to Work Not Applicable   Time frame for Follow Up Appointment 1-2 weeks   neurology clinic.   Other Comments Neurology office in 2-3 weeks to arrange for out pt EEG studies.   Electronic Signatures: Vaughan Basta (MD)  (Signed [phone] 14:21)  Authored: ADMISSION DATE AND DIAGNOSIS, CHIEF COMPLAINT/HPI, Allergies, PERTINENT LABS, PERTINENT RADIOLOGY STUDIES, PERTINENT PAST HISTORY, HOSPITAL COURSE, DISCHARGE INSTRUCTIONS HOME MEDS, PATIENT INSTRUCTIONS   Last Updated: 17-Aug-15 14:21 by Vaughan Basta (MD)

## 2014-12-10 NOTE — H&P (Signed)
PATIENT NAME:  Javier [name], Javier [name] MR#:  [id number] DATE OF BIRTH:  [birth year]  DATE OF ADMISSION:  06/15/2014  PRIMARY CARE PHYSICIAN: Dr. Casimer Lanius.  REFERRING PHYSICIAN: Dr. Larae Grooms.   CHIEF COMPLAINT: Altered mental status.   HISTORY OF PRESENT ILLNESS: Javier [name] is a 79 year old male with a history of multiple medical multiple medical problems, including coronary artery disease status post coronary artery bypass grafting, hypertension, diabetes mellitus, AAA repair in the past, who comes to the Emergency Department with complaints of syncope about 1 to 1-1/2 hours. The patient has been experiencing these episodes more frequently. Concerning this went to Dr. Melrose Nakayama who obtained EEG and started the patient on Keppra. However, the patient has not filled his Keppra. The patient had multiple admissions in the past, almost every month with various complaints of chest pain, syncope, weakness; however, workup has been negative. Workup today in the Emergency Department is completely unremarkable. The patient states experiencing more weakness on the left side of the body.   PAST MEDICAL HISTORY:  1. AAA status post endovascular repair.  2. Atrial fibrillation on chronic anticoagulation.  3. COPD.  4. Hyperlipidemia.  5. Diastolic congestive heart failure.  6. Coronary artery disease status post coronary artery bypass grafting.  7. Hypertension.   PAST SURGICAL HISTORY:  1. Carotid endarterectomy.  2. Four stents placement.  3. Lasik surgery on both eyes.  4. AAA repair. 5. Coronary artery bypass grafting.   ALLERGIES: No known drug allergies.   HOME MEDICATIONS:  1. Zantac 150 mg once a day.  2. Ventolin 90 mcg 2 puffs every 4 hours. 3. Symbicort 160/4.5 two puffs 2 puffs 2 times a day.  4. Spiriva 18 mcg once a day.  5. Sertraline 50 mg once a day.  6. Senna 8.6 mg once a day.  7. Prednisone 15 mg daily.  8. Protonix 40 mg 2 times a day.  9. Nystatin 100,000 units 4 times  a day. 10. Norco 5/325 mg every 4 to 6 hours as needed.  11. Nitrostat 0.4 mg sublingual every 5 minutes as needed. 12. Mucinex 600 mg 2 times a day.  13. Amiodarone 5 mg 3 times a day.  14. Imdur 30 mg once a day.  15. Lasix 20 mg once a day.  16. Fludrocortisone 0.1 mg once a day. 17. Fish oil 1000 mg 2 times a day.  18. Docusate sodium 100 mg 2 times a day.  19. Crestor 20 mg once a day.  20. Bisoprolol 5 mg 2 times a day.  21. Aspirin 81 mg daily.  22. Apixaban 5 mg 2 times a day.  23. Alprazolam 0.5 mg 2 tablets every 6 hours as needed.  24. DuoNeb 4 times a day.   SOCIAL HISTORY: No history of smoking, drinking alcohol, or using illicit drugs. Married, lives with his wife, daughter, and son-in-law.   FAMILY HISTORY: Positive for throat cancer.   REVIEW OF SYSTEMS:  CONSTITUTIONAL: Experiencing generalized weakness.  EYES: No change in vision.  ENT: No change in hearing.  RESPIRATORY: No cough, shortness of breath.  CARDIOVASCULAR: No chest pain, palpations.  GASTROINTESTINAL: No nausea, vomiting, abdominal pain.  GENITOURINARY: No dysuria or hematuria. HEMATOLOGIC: No easy bruising or bleeding.  SKIN: No rash or lesions.  MUSCULOSKELETAL: No joint pains and aches. States weakness on the left side of the body.   PHYSICAL EXAMINATION:  GENERAL: This is a well-built, well-nourished, age-appropriate male lying down in the bed, not in distress.  VITAL SIGNS: Temperature 98,  pulse 100, blood pressure 143/71, respiratory rate of 20, oxygen saturation 100% on room air.  HEENT: Head normocephalic, atraumatic. No scleral icterus. Conjunctivae normal. Pupils equal and react to light. Extraocular movements are intact. Mucous membranes moist. No pharyngeal erythema.  NECK: Supple. No lymphadenopathy. No JVD. No carotid bruit. No thyromegaly.  CHEST: Has no focal tenderness.  LUNGS: Bilaterally clear to auscultation.  HEART: S1, S2 regular. No murmurs are heard.  ABDOMEN: Bowel  sounds present. Soft, nontender, nondistended. No hepatosplenomegaly.  EXTREMITIES: No pedal edema. Pulses 2+.  NEUROLOGIC: The patient is alert, oriented to place, person, and time. Cranial nerves II through XII intact. Motor 5/5 in upper and lower extremities  and downgoing.   EKG: 12-lead normal sinus rhythm with no ST-T wave abnormalities.   ASSESSMENT AND PLAN: Javier [name] is a 69 male with multiple admissions in the past for various symptoms like the chest pain, syncope, left-sided weakness, who comes to the Emergency Department again with 2 episodes of confusion.  1. Altered mental status. The patient was seen by Dr. Melrose Nakayama, underwent the EEG. The patient was initiated on Keppra. However, there was no time to absorb the medication.  2. The patient had extensive workup done in the past. Carotid Dopplers and echocardiogram. No indication to repeat. The patient also had  monitor which was also unremarkable. EEG report is pending. Will reconsult Dr. Melrose Nakayama.  3. Hypertension. Continue with the current regimen.  4. Hyperlipidemia. Continue with the Crestor.  5. Keep the patient on deep vein thrombosis prophylaxis with Lovenox.   TIME SPENT: 50 minutes.    ____________________________ Monica Becton, MD pv:lt D: 06/16/2014 00:07:16 ET T: 06/16/2014 00:43:34 ET JOB#: [id number]  cc: Monica Becton, MD, <Dictator> Monica Becton MD ELECTRONICALLY SIGNED 06/18/2014 23:23

## 2014-12-10 NOTE — Consult Note (Signed)
Present Illness The patient is a 79 year old states that he started having left arm and left leg weakness, which started yesterday in the afternoon and it was feeling heavy. Now his symptoms have resolved. He also states he started having abdominal pain in the epigastric area.  The pain has been present ofr a week or two.  It is made worse with food.  No alleviating factors.  The ED physician did a CT scan of his abdomen noncontrast, and the patient is noted to have an infrarenal abdominal aortic aneurysm with maximal diameter of 5.7 cm. The patient otherwise denies any changes in bowel habits. No fevers, chills. No chest pain. He has chronic intermittent shortness of breath. He uses oxygen as needed and then at nighttime.   PAST MEDICAL HISTORY:  1.  History of diastolic CHF. 2.  History of COPD.  3.  Chronic atrial fibrillation, for which he is on Coumadin. 4.  History of hypertension. 5.  History of prostate cancer.  6.  History of coronary artery disease, status post coronary artery bypass graft in 2008, multiple stents.  7.  Carotid endarterectomy.  8.  TIA.  9.  History of peripheral vascular disease.   Home Medications: Medication Instructions Status  warfarin 1 mg oral tablet 1.5 tabs (1.37m) orally once a day (at bedtime) Active  ALPRAZolam 0.25 mg oral tablet 1 tab(s) orally every 6 hours, As Needed for anxiety Active  nystatin 100,000 units/mL oral suspension 5 milliliter(s) orally every 6 hours Active  Symbicort 160 mcg-4.5 mcg/inh inhalation aerosol 2 puff(s) inhaled 2 times a day Active  Eligard 45 mg/6 months subcutaneous injection, extended release 45 milligram(s) subcutaneous every 6 months on the first of April and October Active  ferrous sulfate 325 mg oral tablet 1 tab(s) orally 2 times a day Active  Nitrostat 0.4 mg sublingual tablet 1 tab(s) sublingual every 5 minutes up to 3 doses as needed for chest pain. *if no relief call md or go to emergency room* Active   fludrocortisone 0.1 mg oral tablet 1 tab(s) orally once a day (in the morning) Active  Zoloft 50 mg oral tablet 1 tab(s) orally once a day (in the morning) Active  Crestor 20 mg oral tablet 1 tab(s) orally once a day (in the evening) Active  Fish Oil 1000 mg oral capsule 1 cap(s) orally 2 times a day Active  docusate sodium 100 mg oral tablet 1 tab(s) orally 2 times a day Active  furosemide 20 mg oral tablet 1 tab(s) orally once a day (in the morning), As Needed for swelling Active  albuterol-ipratropium 2.5 mg-0.5 mg/3 mL inhalation solution 1 vial (3 milliliters) via nebulizer 4 times a day, As Needed - for Shortness of Breath Active  montelukast 10 mg oral tablet 1 tab(s) orally once a day (at bedtime)  Active  Ventolin HFA 90 mcg/inh inhalation aerosol 2 puff(s) inhaled every 4 hours, As Needed - for Shortness of Breath Active  morphine 20 mg/mL oral concentrate 0.25 milliliter(s) orally every 4 hours, As Needed - for Pain Active  aspirin 81 mg oral delayed release tablet 1 tab(s) orally once a day (in the morning) Active  Vitamin C 1000 mg oral tablet 1 tab(s) orally once a day (in the morning) Active  metoprolol tartrate 50 mg oral tablet 1 tab(s) orally 2 times a day Active  Spiriva 18 mcg inhalation capsule 1 caps via handihaler once a day (in the morning) Active  PriLOSEC 20 mg oral delayed release tablet 1 tab(s)  orally once a day (in the morning) Active  Senna 8.6 mg oral tablet 1 tab(s) orally once a day (in the morning) Active  potassium chloride 10 mEq oral tablet, extended release 1 tab(s) orally once a day, As Needed for swelling when taking lasix Active  Mucinex 600 mg oral tablet, extended release 1 tab(s) orally 2 times a day, As Needed for cough/congestion Active  Robitussin DM To Go 20 mg-200 mg/10 mL oral liquid 10 milliliter(s) orally every 4 hours, As Needed for cough Active  acetaminophen-traMADol 325 mg-37.5 mg oral tablet 1 tab(s) orally 1 to 2 times a day as needed  for joint pain. Active  codeine-guaiFENesin 10 mg-100 mg/5 mL oral syrup 10 milliliter(s) orally every 4 hours as needed for pain from coughing Active  acetaminophen-HYDROcodone 325 mg-5 mg oral tablet 1 tab(s) orally every 4 hours as needed for severe pain from coughing Active    No Known Allergies:   Case History:  Family History Non-Contributory   Social History negative tobacco, negative ETOH, negative Illicit drugs   Review of Systems:  Fever/Chills No   Cough No   Sputum No   Abdominal Pain Yes   Diarrhea No   Constipation No   Nausea/Vomiting No   SOB/DOE No   Chest Pain No   Dysuria No   Physical Exam:  GEN well developed, well nourished, obese   HEENT pink conjunctivae, hearing intact to voice, moist oral mucosa   NECK supple  trachea midline   RESP normal resp effort  no use of accessory muscles   CARD regular rate  positive carotid bruits  LE edema present   ABD positive tenderness  denies Flank Tenderness  soft  mild distention   EXTR negative cyanosis/clubbing, positive edema, nonpalpable pedal pulses right side 1+ left DP nonpalpal left PT   SKIN No rashes, No ulcers   NEURO cranial nerves intact, motor/sensory function intact   PSYCH alert, good insight   Nursing/Ancillary Notes: **Vital Signs.:   21-Feb-15 12:00  Vital Signs Type Routine  Temperature Temperature (F) 97.7  Celsius 36.5  Temperature Source oral  Pulse Pulse 79  Respirations Respirations 18  Systolic BP Systolic BP 614  Diastolic BP (mmHg) Diastolic BP (mmHg) 81  Mean BP 114  Pulse Ox % Pulse Ox % 97  Pulse Ox Activity Level  At rest  Oxygen Delivery 2L   Thyroid:  21-Feb-15 05:51   Thyroid Stimulating Hormone 2.85 (0.45-4.50 (International Unit)  ----------------------- Pregnant patients have  different reference  ranges for TSH:  - - - - - - - - - -  Pregnant, first trimetser:  0.36 - 2.50 uIU/mL)  LabObservation:  21-Feb-15 09:47   OBSERVATION Reason  for Test  Hepatic:  20-Feb-15 17:15   Bilirubin, Total 0.6  Alkaline Phosphatase 62 (45-117 NOTE: New Reference Range 07/09/13)  SGPT (ALT) 19  SGOT (AST) 17  Total Protein, Serum 6.5  Albumin, Serum  2.7  Routine Chem:  20-Feb-15 17:15   Glucose, Serum 93  BUN  19  Creatinine (comp) 1.09  Sodium, Serum 143  Potassium, Serum 3.9  Chloride, Serum  108  CO2, Serum  34  Calcium (Total), Serum  8.2  Anion Gap  1  Osmolality (calc) 287  eGFR (African American) >60  eGFR (Non-African American) >60 (eGFR values <32m/min/1.73 m2 may be an indication of chronic kidney disease (CKD). Calculated eGFR is useful in patients with stable renal function. The eGFR calculation will not be reliable in acutely ill patients  when serum creatinine is changing rapidly. It is not useful in  patients on dialysis. The eGFR calculation may not be applicable to patients at the low and high extremes of body sizes, pregnant women, and vegetarians.)  21-Feb-15 05:51   Glucose, Serum 96  BUN  19  Creatinine (comp) 1.17  Sodium, Serum 140  Potassium, Serum 3.6  Chloride, Serum 107  CO2, Serum 29  Calcium (Total), Serum 8.8  Anion Gap  4  Osmolality (calc) 282  eGFR (African American) >60  eGFR (Non-African American) >60 (eGFR values <35m/min/1.73 m2 may be an indication of chronic kidney disease (CKD). Calculated eGFR is useful in patients with stable renal function. The eGFR calculation will not be reliable in acutely ill patients when serum creatinine is changing rapidly. It is not useful in  patients on dialysis. The eGFR calculation may not be applicable to patients at the low and high extremes of body sizes, pregnant women, and vegetarians.)  Cholesterol, Serum 154  Triglycerides, Serum  207  HDL (INHOUSE) 48  VLDL Cholesterol Calculated  41  LDL Cholesterol Calculated 65 (Result(s) reported on 09 Oct 2013 at 06:55AM.)  Cardiac:  20-Feb-15 17:15   Troponin I < 0.02 (0.00-0.05 0.05  ng/mL or less: NEGATIVE  Repeat testing in 3-6 hrs  if clinically indicated. >0.05 ng/mL: POTENTIAL  MYOCARDIAL INJURY. Repeat  testing in 3-6 hrs if  clinically indicated. NOTE: An increase or decrease  of 30% or more on serial  testing suggests a  clinically important change)  Routine UA:  20-Feb-15 17:27   Color (UA) Yellow  Clarity (UA) Hazy  Glucose (UA) Negative  Bilirubin (UA) Negative  Ketones (UA) Negative  Specific Gravity (UA) 1.016  Blood (UA) Negative  pH (UA) 7.0  Protein (UA) Negative  Nitrite (UA) Negative  Leukocyte Esterase (UA) Negative (Result(s) reported on 08 Oct 2013 at 05:47PM.)  RBC (UA) 1 /HPF  WBC (UA) <1 /HPF  Bacteria (UA) TRACE  Epithelial Cells (UA) NONE SEEN  Amorphous Crystal (UA) PRESENT (Result(s) reported on 08 Oct 2013 at 05:47PM.)  Routine Coag:  20-Feb-15 17:15   Prothrombin  16.1  INR 1.3 (INR reference interval applies to patients on anticoagulant therapy. A single INR therapeutic range for coumarins is not optimal for all indications; however, the suggested range for most indications is 2.0 - 3.0. Exceptions to the INR Reference Range may include: Prosthetic heart valves, acute myocardial infarction, prevention of myocardial infarction, and combinations of aspirin and anticoagulant. The need for a higher or lower target INR must be assessed individually. Reference: The Pharmacology and Management of the Vitamin K  antagonists: the seventh ACCP Conference on Antithrombotic and Thrombolytic Therapy. CVWUJW.1191Sept:126 (3suppl): [id number] A HCT value >55% may artifactually increase the PT.  In one study,  the increase was an average of 25%. Reference:  "Effect on Routine and Special Coagulation Testing Values of Citrate Anticoagulant Adjustment in Patients with High HCT Values." American Journal of Clinical Pathology 2006;126:400-405.)  21-Feb-15 05:51   Prothrombin  16.9  INR 1.4 (INR reference interval applies to patients on  anticoagulant therapy. A single INR therapeutic range for coumarins is not optimal for all indications; however, the suggested range for most indications is 2.0 - 3.0. Exceptions to the INR Reference Range may include: Prosthetic heart valves, acute myocardial infarction, prevention of myocardial infarction, and combinations of aspirin and anticoagulant. The need for a higher or lower target INR must be assessed individually. Reference: The Pharmacology and Management of the Vitamin K  antagonists: the seventh ACCP Conference on Antithrombotic and Thrombolytic Therapy. HUTML.4650 Sept:126 (3suppl): [id number]. A HCT value >55% may artifactually increase the PT.  In one study,  the increase was an average of 25%. Reference:  "Effect on Routine and Special Coagulation Testing Values of Citrate Anticoagulant Adjustment in Patients with High HCT Values." American Journal of Clinical Pathology 2006;126:400-405.)  Routine Hem:  20-Feb-15 17:15   WBC (CBC) 9.3  RBC (CBC)  3.65  Hemoglobin (CBC)  11.0  Hematocrit (CBC)  33.2  Platelet Count (CBC)  79  MCV 91  MCH 30.2  MCHC 33.2  RDW  15.6  Neutrophil % 84.2  Lymphocyte % 10.2  Monocyte % 4.1  Eosinophil % 1.1  Basophil % 0.4  Neutrophil #  7.8  Lymphocyte #  0.9  Monocyte # 0.4  Eosinophil # 0.1  Basophil # 0.0 (Result(s) reported on 08 Oct 2013 at 05:28PM.)  21-Feb-15 05:51   WBC (CBC) 8.3  RBC (CBC)  3.61  Hemoglobin (CBC)  10.8  Hematocrit (CBC)  33.0  Platelet Count (CBC)  78  MCV 91  MCH 29.9  MCHC 32.7  RDW  14.7  Neutrophil % 72.0  Lymphocyte % 20.3  Monocyte % 5.3  Eosinophil % 1.8  Basophil % 0.6  Neutrophil # 6.0  Lymphocyte # 1.7  Monocyte # 0.4  Eosinophil # 0.1  Basophil # 0.0 (Result(s) reported on 09 Oct 2013 at 06:47AM.)   CT:    20-Feb-15 16:49, CT Abdomen Pelvis WO for Stone  CT Abdomen Pelvis WO for Stone   REASON FOR EXAM:    pt with new onset stroke.  puling sensation in    bothlegs  COMMENTS:   May transport without cardiac monitor    PROCEDURE: CT  - CT ABDOMEN /PELVIS WO (STONE)  - Oct 08 2013  4:49PM     CLINICAL DATA:  Abdominal pain, mental status change.    EXAM:  CT ABDOMEN AND PELVIS WITHOUT CONTRAST    TECHNIQUE:  Multidetector CT imaging of the abdomen and pelvis was performed  following the standard protocol without IV contrast.  COMPARISON:  None.    FINDINGS:  There is an infrarenal abdominal aortic aneurysm exhibiting mural  thrombus with maximal AP dimension of 5.7 cm AP x 5.4 cm  transversely. This extends into the common iliac arteries which  measure just over 2 cm in diameter proximally. There is no evidence  of perianeurysmal leakage of blood.    The liver exhibits no focal mass nor ductal dilation. The  gallbladder, pancreas, spleen, partially distended stomach, adrenal  glands, and kidneys exhibit no acute abnormalities. There is likely  a cyst exophytic from the medial aspect of the midpole of the left  kidney with HU measurement of +11. This presumed cyst measures just  over 2 cm in greatest dimension. The non-opacified loops of small  and large bowel exhibit no evidence of ileus nor obstruction nor  acute inflammation. There are seed implants in the prostatic bed.  The partially distended urinary bladder is normal in appearance. The  lumbar vertebral bodies exhibit chronic compression of the body of  L2 which was seen on a previous MRI in December of 2014. There is  also chronic compression of the body of T11.    There is a moderate-sized right pleural effusion and trace left  pleural effusion. There is no pneumonia demonstrated at the lung  bases. The cardiac chambers are mildly enlarged.     IMPRESSION:  1.  There is an infrarenal abdominal aortic aneurysm with maximal  diameter of 5.7 cm AP. There is no perianeurysmal leakage of blood.  2. There is no evidence of acute hepatobiliary or acute urinary  tract  abnormality.  3. There is no evidence of bowel obstruction or ileus or acute  inflammation.  4. There is a moderate-sized right pleural effusion and trace left  pleural effusion.      Electronically Signed    By: David  Martinique    On: 10/08/2013 16:58         Verified By: DAVID A. Martinique, M.D., MD    Impression 1  AAA        patient will need CT scan with contrast but based on the noncon scan he is a candidate for endovascular repair       He will need cardiac clearance and I have call Dr Rockey Situ       No Coumadin until after the repair Lovenox/Heparin ok       Continue antiplatelet therapy 2.  CAD         Cardiology on consult 3.  COPD          will ask medical service to address the pleural effusion          treat COPD in prep for surgery 4.  Abdominal Pain          doubt it is related to his AAA          consider GI evaluation          initiate treatment for gastritis/ulcer disease 5.  Carotid Stenosis          duplex ultrasound pending 6.  PAD bilateral lower extremities           will need evaluation as an out patient.   Plan Level 5 consult   Electronic Signatures: Hortencia Pilar (MD)  (Signed 21-Feb-15 15:15)  Authored: General Aspect/Present Illness, Home Medications, Allergies, History and Physical Exam, Vital Signs, Labs, Radiology, Impression/Plan   Last Updated: 21-Feb-15 15:15 by Hortencia Pilar (MD)

## 2014-12-10 NOTE — Discharge Summary (Signed)
PATIENT NAME:  Javier [name], Javier [name] MR#:  [id number] DATE OF BIRTH:  Jul 28, 1936  DATE OF ADMISSION:  06/16/2014 DATE OF DISCHARGE:  06/17/2014  DISCHARGE DIAGNOSES:  1.  Syncope.  2.  Chronic obstructive pulmonary disease.  3.  Chronic atrial fibrillation.  4.  Chronic diastolic heart failure.  5.  Depression.   6.  Joint pains.   DISCHARGE MEDICATIONS:  1.  Symbicort 160/4.5 mcg 2 puffs b.i.d.  2.  Eligard 45 mg every 6 months subcutaneous.  3.  Ventolin 90 mcg 2 puffs every 4 hours as needed for wheezing or shortness of breath.  4.  Fludrocortisone 0.1 mg 1 tablet in the morning, take 3 times a week. The patient advised to take it for weight gain more than 5 pounds. If not, he will take furosemide with potassium.  5.  Crestor 20 mg daily.  6.  Fish oil 1 gram p.o. b.i.d.  7.  Colace 100 mg p.o. b.i.d.  8.  Spiriva 18 mcg inhalation daily.  9.  Senna 8.6 mg p.o. daily.  10.  Mucinex 600 mg p.o. b.i.d.  11.  Pantoprazole 40 mg p.o. b.i.d.  12.  Bisoprolol 5 mg p.o. b.i.d.  13.  Ferrous sulfate 325 mg p.o. b.i.d.  14.  Aspirin 81 mg p.o. daily.  15.  Zantac 150 mg p.o. daily.  16.  Furosemide 20 mg daily as needed.  17.  Xanax 0.5 mg 2 tablets every 6 hours as needed for anxiety.  18.  Imdur 30 mg at bedtime.  19.  Midodrine 5 mg p.o. t.i.d. as needed for low BP, systolic blood pressure less than 100.  20.  Prednisone 5 mg 3 times a day. Advised to taper it off as per physician instructions.  21.  Eliquis 5 mg p.o. b.i.d.  22.  Norco 5/325 mg 1 tablet every 4-6 hours as needed for pain.  23.  Keppra. This is a new medicine, Keppra 750 mg q. 12 hours.   CONSULTATIONS: Neurology consult with Dr. Irish Elders.   HOSPITAL COURSE: This is a 79 year old male patient admitted for syncope. The patient thought to have seizure at home. He was disoriented and brought by the family. The patient has been having syncopal episodes and he went to see Dr. Melrose Nakayama and he did an EEG and started the patient  on keppra    ____________________________ Epifanio Lesches, MD sk:ts D: 06/17/2014 22:10:00 ET T: 06/18/2014 02:44:09 ET JOB#: [id number]  cc: Epifanio Lesches, MD, <Dictator> Epifanio Lesches MD ELECTRONICALLY SIGNED 07/11/2014 6:58

## 2014-12-10 NOTE — Discharge Summary (Signed)
PATIENT NAME:  Javier [name], Javier [name] MR#:  [id number] DATE OF BIRTH:  12-14-1935  DATE OF ADMISSION:  02/22/2014 DATE OF DISCHARGE:  02/25/2014  REASON FOR ADMISSION: Shortness of breath and chest pressure.   DISCHARGE DIAGNOSES: 1.  Acute on chronic diastolic congestive heart failure.  2.  Pleural effusion.  3.  Hypokalemia. 4.  Hypomagnesemia.  5.  History of syncope due to orthostatic hypotension.  6.  Coronary artery disease.  7.  Hypertension.  8.  History of cerebrovascular accident with chronic atrial fibrillation.  9.  Aortic abdominal aneurysm status post endovascular stent and graft.  10.  Peripheral vascular disease.  11.  Chronic obstructive pulmonary disease.  12.  Hyperlipidemia.  13.  Chronic anticoagulation.   DISPOSITION: Home.   DISCHARGE MEDICATIONS: Change in medication: Rather than taking Lasix 20 mg every other day as needed, the patient will be taking 20 mg every day now.  1.  Symbicort 160/4.5 mg 2 puffs twice daily.  2.  Eligard 45 mg every 6 months subcutaneously. 3.  Ventolin 90 mcg 2 puffs every 4 hours as needed for shortness of breath. 4.  Nitrostat 1 tablet sublingual every 5 minutes as needed for chest pain.  5.  Fludrocortisone 0.1 mg 3 times a week for 5 pounds more weight gain. 6.  Crestor 20 mg every evening.  7.  Fish oil 1000 mg 2 times daily. 8.  Docusate 1 tablet twice daily.  9.  Spiriva 18 mcg once a day. 10.  Senna 8.6 mg once a day. 11.  Mucinex 600 mg 2 times daily. 12.  Klor-Con 20 mEq once a day. 13.  Protonix 40 mg 2 times daily.  14.  Midodrine 5 mg 3 times a day. 15.  Bisoprolol 5 mg twice daily. 16.  Nystatin as needed for yeast. 17.  Hydromet 1 teaspoon every 6 hours as needed for cough.  18.  Robitussin as needed for cough. 19.  Ferrous sulfate 325 mg twice daily. 20.  Aspirin 81 mg once daily. 21.  Sertraline 50 mg once daily. 22.  Eliquis 5 mg twice daily. 23.  Alprazolam 0.5 mg 2 tablets once a day as needed for  anxiety. 24.  Albuterol nebs as needed for shortness of breath. 25.  Zantac 150 mg as needed for indigestion. 26.  Tramadol 50 mg twice daily. 27.  Isosorbide mononitrate 30 mg once a day. 28.  Furosemide 20 mg once a day.   DISPOSITION: Home.   DISCHARGE FOLLOWUP: Follow up with Dr. Ida Rogue and Dr. Francee Nodal.  DISCHARGE DIET: Low sodium.   HOSPITAL COURSE: This is a 79 year old male with history of diastolic congestive heart failure, COPD, chronic respiratory failure on 2 liters nasal cannula, coronary artery disease, atrial fibrillation on anticoagulation, and history of CVAs in the past and TIAs despite the fact that he was on Plavix. The patient had an admission due to shortness of breath and chest pressure, presented to the Emergency Department with these symptoms, associated also orthopnea and lower extremity swelling. On evaluation the patient seemed to have an increase in weight up to 220 pounds. Today it is 211. The patient had oxygen saturation of 94% on 2 liters nasal cannula. His pulse was 78, but his respiratory rate was 28. On evaluation he had significant crackles and decreased air entry. His EKG showed atrial fibrillation without any ST changes. His white blood count was normal. His chest x-ray showed overt congestive heart failure with bilateral pleural effusions. The patient also  had a CT scan that showed significant amount of fluid and pleural effusion, but no pulmonary embolus. The patient was put on the floor and as per problems goes:  1.  Acute on chronic diastolic congestive heart failure with exacerbation. The patient was put on Lasix IV twice daily. He had significant diuresis. His weight started at 220 and ended up at 211. The patient believes this is his dry weight. His congestive heart failure decompensation has improved and at this moment he is able to be discharged in good condition. There was question about the patient being really sick, tired, not able to  ambulate. After further investigation, the patient has had these symptoms for months and his condition has not really changed drastically as in deterioration or improvement. He is just in steady state. He feels just a little bit overwhelmed for the fact that he is not able to do more. His heart has not suffered any significant changes recently, but he has not been taking Lasix on a regular basis. He has been taking it only every other day. He has had multiple hospitalizations and lot of those hospitalizations have been secondary to anxiety-related symptoms. The patient at this time had significant pulmonary congestion and after Lasix was given IV the patient improved significantly. His creatinine has been bumping up. It is around 1.5, which is not that concerning on his case. The patient is going to be taking scheduled Lasix now on. Continue beta blocker. Continue close follow-up with cardiology. In order to decrease re-admissions, the patient has been put on home health. We recommend home health increase the number of visits that he is requiring and possibly use extra doses of Lasix including IV Lasix on a regular basis if the patient has significant weight gain of more than 3 pounds in 24 hours or more than 5 on a weekly basis.  2.  As far as his pleural effusion, actually that improved significantly on the repeat x-ray after he got diuresis. We offered thoracentesis, but we waited since the patient was on Eliquis and at this point I do not think it is necessary. The patient is back to 2 liters of oxygen, which is his normal home oxygen flow.  3.  As far as his chronic atrial fibrillation, it seems to be stable. Continue beta blocker. Continue Eliquis.  4.  A far as his syncope, the patient has had a loop monitor that still has not be reported to him. Dr. Tyrell Antonio office, Dr. Donivan Scull office, will go through those results as soon they are evaluated. The patient likely has syncope secondary to orthostatic  hypotension for what he has been put on midodrine.  5.  The hypotension also should be managed with positional changes with rising slow with sitting down for a minute or more if necessary after the patient is coming from the supine position and resting for at least 30 seconds to a minute whenever the patient is going up from sitting to standing.  6.  COPD. Has no signs of exacerbation. His bump on creatinine is due to diuresis. His original creatinine was 1.26 and is discharged with a creatinine of 1.58. Monitor outpatient.  7.  His other medical problems are stable. We had long conversations with the patient as far as his care and as far as how to prevent hospitalizations, stay on diet, fluid restriction, taking all his medications, etc.   TIME SPENT ON DISCHARGE: 45 minutes.  ____________________________ Allenville Sink, MD rsg:sb D: 02/25/2014 13:13:53 ET  T: 02/25/2014 13:46:06 ET JOB#: [id number]  cc: Funk Sink, MD, <Dictator> Minna Merritts, MD Francee Nodal, MD Cristi Loron MD ELECTRONICALLY SIGNED 03/03/2014 23:08

## 2014-12-10 NOTE — Consult Note (Signed)
Brief Consult Note: Diagnosis: Epigastric pain with a colonoscopy within the last year by Dr. Vira Agar. Now with triple A.   Patient was seen by consultant.   Consult note dictated.   Comments: patient will be set up for an EGD for tomorrow.  Electronic Signatures: Lucilla Lame (MD)  (Signed [phone] 09:01)  Authored: Brief Consult Note   Last Updated: 22-Feb-15 09:01 by Lucilla Lame (MD)

## 2014-12-10 NOTE — Consult Note (Signed)
PATIENT NAME:  Javier [name], Javier [name] MR#:  [id number] DATE OF BIRTH:  14-Aug-1936  DATE OF CONSULTATION:  06/16/2014  REFERRING PHYSICIAN:   CONSULTING PHYSICIAN:  Leotis Pain, MD  REASON FOR CONSULTATION:  Syncopal episodes.   HISTORY OF PRESENT ILLNESS:  A 79 year old gentleman with past medical history of AAA, atrial fibrillation, chronic obstructive pulmonary disease, hyperlipidemia, and diastolic congestive heart failure, coronary artery disease, hypertension, presenting with a syncopal episode.  As per patient, the patient has had frequent syncopal episodes about 3 in the past week. The patient states he does not remember what happens prior. He just states he passes out. When questioned about shaking activity, the patient states that bystanders see him having tremors. No urinary incontinence. No tongue biting. The patient was seen Dr. Melrose Nakayama as an outpatient, status post EEG that was done. Results unknown at this point, and the patient was started on Keppra. The patient did not fill his Keppra script.  Currently back to baseline.   PAST MEDICAL HISTORY: AAA, atrial fibrillation, chronic obstructive pulmonary disease, hyperlipidemia, and diastolic congestive heart failure, coronary artery disease, hypertension.   PAST SURGICAL HISTORY: Carotid endarterectomy, status post multiple stents, Lasik surgery in both eyes, AAA repair, coronary artery bypass grafting.   HOME MEDICATIONS: Been reviewed.   SOCIAL HISTORY: No history of smoking, drinking, or alcohol use.   FAMILY HISTORY: Positive for throat cancer.   REVIEW OF SYSTEMS:  No shortness of breath. No visual changes. No abdominal pain. No chest pain. Slight weakness on one side of the body compared to the other. T  PHYSICAL EXAMINATION:  The patient  with a temperature of 99.6, pulse 115, respirations 27, blood pressure 152/72.  NEUROLOGIC:  This patient's be slightly dysarthric. Facial sensation intact. Facial motor is intact. Tongue is  midline.  He is able tell me his name and month; could not tell me the location. Motor strength questionable. Left upper extremity drift.  Resting of the extremities are 4+/5. Sensation intact. Reflexes diminished. Coordination: Finger-to-nose intact. Gait not assessed.   IMPRESSION: A 79 year old male with multiple medical problems such as coronary artery disease, atrial fibrillation, hypertension and diabetes, presents with multiple syncopal episodes.  Syncopal episode duration up to one hour, which is less likely for conventional syncope. The patient states that he has tremors after he loses consciousness. When he wakes up he is confused. No tongue biting, no urinary incontinence.   PLAN: Agree with obtaining MRI. Would obtain MRI of the brain with and without contrast. The patient did have recent stroke work-up that was completely negative, would not repeat it at this point. I agree with the patient being on Keppra 750 mg twice a day. May consider an EEG or possibly obtain EEG from [facility]. I do not think at this point change our management since the patient will stay on antiepileptic medication at this time.   The patient also had Holter monitoring. If patient's MRI is normal, I would consider discharge planning from a neurological standpoint and leave on Keppra 750 mg twice a day.   Thank you. It was a pleasure seeing this patient.     ____________________________ Leotis Pain, MD yz:jp D: 06/16/2014 12:41:56 ET T: 06/16/2014 14:43:40 ET JOB#: [id number]  cc: Leotis Pain, MD, <Dictator> Leotis Pain MD ELECTRONICALLY SIGNED 06/22/2014 12:30

## 2014-12-10 NOTE — Consult Note (Signed)
General Aspect 79 y/o male with a h/o CAD s/p CABG, chronic orthostasis with periodic syncope, chronic afib on eliquis, and PAD who was admitted with chest pain/angina.   Present Illness . 79 y/o male with a h/o CAD s/p CABG in 2007.  His last cath was in 06/2013, revealing patent LM stent with 2/3 patent grafts (known occlusion to the VG->Diag).  He also has a h/o chronic diast chf (EF 50-55% 09/2013), afib (on eliquis), Rem tobacco abuse (1/2 ppd x 50 yrs, quit 2014) with O2 dependent COPD and chronic bronchitis, AAA s/p endovascular repair 09/2013, HTN, TIA s/p bilat CEA (2002), and a long h/o orthostatic hypotension with periodic syncope on florinef therapy.  Admitted in 01/07/14 with syncope, turned in 30 day even monitor in the past few days. No recent arrhythmia.    admitted to [facility] in March 2015 with for diast chf exacerbation.  He was effectively diuresed and since that discharge, he has done reasonably well.    His weight has been stable and he has been adjusting his home lasix dose accordingly.    This AM, he woke at 7:30 with chest pain radiating left to right. Notable SOB, like breathing through a wet sponge. He did not take lasix today. Feet a little more swollen recently. He took aspirin adn NTG x 3. Moderate improvement of his sx, though it recurred again. He called EMTs and again received NTG x 3 and was brought to the ER.   In teh ER, started on NTG infusion with improvemt of his pain, now off the infusion and started on NTG paste. Pain is a 2/10, radiating b/l across chest.  He reports symptomatic palps last saturday. Sunday went out to dinner and felt tired after he got home. "maybe the heat",   Physical Exam:  GEN well developed, pleasant, nad.   HEENT hearing intact to voice, moist oral mucosa   NECK supple  No masses  no bruits/jvd.   RESP normal resp effort  clear BS  crackles at the left base 1/2 up   CARD Irregular rate and rhythm  Normal, S1, S2  No murmur   ABD  denies tenderness  soft  normal BS   LYMPH negative neck   EXTR negative cyanosis/clubbing, negative edema   SKIN normal to palpation   NEURO grossly intact, nonfocal.   PSYCH alert, A+O to time, place, person, good insight   Review of Systems:  Subjective/Chief Complaint Tired, chest pain   General: No Complaints   Skin: No Complaints   ENT: No Complaints   Eyes: No Complaints   Neck: No Complaints   Respiratory: Short of breath  on home O2, chronic SOB, mild cough   Cardiovascular: Chest pain or discomfort  Tightness   Gastrointestinal: No Complaints  Nausea   Genitourinary: No Complaints   Vascular: No Complaints   Musculoskeletal: No Complaints   Neurologic: No Complaints   Hematologic: No Complaints   Endocrine: No Complaints   Psychiatric: No Complaints   Review of Systems: All other systems were reviewed and found to be negative   Medications/Allergies Reviewed Medications/Allergies reviewed   Family & Social History:  Family and Social History:  Family History mother died with MI in her early 60's.  Father died of cancer in his 52's.   Social History smoked ~ 1/3-1/2 ppd x 50+ yrs, quit 2014. No etoh/drugs.   Place of Living Home  Lives in [place] with wife, dtr, son-in-law.     Remote Tobacco  Abuse: a. 1/3 - 1/2 ppd x 50+ yrs.  Quit 2014.   AAA: a. 09/2013 s/p endovascular stenting.   COPD with recurrent Bronchitis: a. On Home O2.   Orthostatic Hypotension and Syncope: a. On florinef;  b. 09/2013 Echo: EF 50-55%.   Permanent Afib: a. CHA2DS2VASc = 7, On eliquis.   Chronic Diastolic CHF: a. 01/4402 Echo: EF 50-55%, mild LVH, low nl RV fxn, mild to mod MR/TR.   Carotid Arterial Dzs: a. 2002 s/p bilat CEA.   TIA/CVA: a. 2002 s/p bilat CEA.   CAD: a. s/p CABG 2007;  b. s/p prior stenting including LM stenting;  c. 06/2013 Myoview: Lat ischemia;  d.  06/2013 Cath: LM stent patent, LIMA->LAD ok, VG->OM3 ok, VG->Diag known to be occluded.    afib:    Nocturnal hypoxia on noct 02:    Anemia, Chronic:    CVA/Stroke:    MI - Myocardial Infarct:    prostate cancer with brachytherapy:    peripheral vascular disease:    HTN:    CAD:    COPD:    Bronchitis:    High cholesterol:    angina:    AAA repair:    CABG (Coronary Artery Bypass Graft):    lasik surgery both eyes:    4 cardiac stents:    Carotid Endarterectomy:   Home Medications: Medication Instructions Status  Klor-Con M20 oral tablet, extended release 1 tab(s) orally once a day, As Needed when taking lasix Active  Symbicort 160 mcg-4.5 mcg/inh inhalation aerosol 2 puff(s) inhaled 2 times a day Active  Eligard 45 mg/6 months subcutaneous injection, extended release 45 milligram(s) subcutaneous every 6 months on the first of April and October Active  Nitrostat 0.4 mg sublingual tablet 1 tab(s) sublingual every 5 minutes up to 3 doses as needed for chest pain. *if no relief call md or go to emergency room* Active  fludrocortisone 0.1 mg oral tablet 1 tab(s) orally once a day (in the morning) take only 3 times a week for 5 pounds or more weight gain/ swelling. If no relief, take furosemide with potassium.Marland Kitchen Active  Crestor 20 mg oral tablet 1 tab(s) orally once a day (in the evening) Active  Fish Oil 1000 mg oral capsule 1 cap(s) orally 2 times a day Active  docusate sodium 100 mg oral tablet 1 tab(s) orally 2 times a day Active  Ventolin HFA 90 mcg/inh inhalation aerosol 2 puff(s) inhaled every 4 hours, As Needed - for Shortness of Breath Active  Vitamin C 1000 mg oral tablet 1 tab(s) orally once a day (in the morning) Active  Spiriva 18 mcg inhalation capsule 1 caps via handihaler once a day (in the morning) Active  Senna 8.6 mg oral tablet 1 tab(s) orally once a day (in the morning) Active  Mucinex 600 mg oral tablet, extended release 1 tab(s) orally 2 times a day, As Needed for cough/congestion Active  pantoprazole 40 mg oral delayed release tablet 1  tab(s) orally 2 times a day Active  midodrine 5 mg oral tablet 1 tab(s) orally 3 times a day for low blood pressure. *blood pressure under 100=no lasix* Active  bisoprolol 5 mg oral tablet 1 tab(s) orally 2 times a day Active  nystatin 100000 units/mL oral suspension swish and swallow 5 milliliter(s) orally 2 times a day Active  Hydromet 1.5 mg-5 mg/5 mL oral syrup 1 teaspoonsful (5 milliliters) orally every 6 hours as needed for pain from coughing Active  Robitussin DM To Go 20 mg-200 mg/10  mL oral liquid 10 milliliter(s) orally every 4 hours, As Needed for cough Active  ferrous sulfate 325 mg oral tablet 1 tab(s) orally 2 times a day Active  Aspirin Enteric Coated 81 mg oral delayed release tablet 1 tab(s) orally once a day (in the morning) Active  sertraline 50 mg oral tablet 1 tab(s) orally once a day (in the morning) Active  furosemide 20 mg oral tablet 1 tab(s) orally every other day for weight less than 215 lbs, take 1 tab orally once a day for weight 215-to 218 lbs, take 2 tabs (34m) orally once a day for weight greater than 219 to 222 lbs, 1 tab orally 2 times a day for weight greater than 223 lbs. Active  Eliquis 5 mg oral tablet 1 tab(s) orally 2 times a day Active  ALPRAZolam 0.5 mg oral tablet 2 tabs (149m orally once, then 1 tab orally every 15 minutes for 1 hour only as needed for anxiety. Active  albuterol 2.5 mg/3 mL (0.083%) inhalation solution 1 vial (3 milliliters) via nebulizer 4 times a day as needed for shortness of breath/wheezing  Active  Zantac 150 150 mg oral tablet 1 tab(s) orally once a day, As Needed - for Indigestion, Heartburn Active   Lab Results: Routine Chem:  28-Jun-15 10:04   Glucose, Serum 89  BUN  22  Creatinine (comp)  1.47  Sodium, Serum 143  Potassium, Serum 3.7  Chloride, Serum 107  CO2, Serum 29  Calcium (Total), Serum 8.7  Anion Gap 7  Osmolality (calc) 288  eGFR (African American)  53  eGFR (Non-African American)  46 (eGFR values  <6098min/1.73 m2 may be an indication of chronic kidney disease (CKD). Calculated eGFR is useful in patients with stable renal function. The eGFR calculation will not be reliable in acutely ill patients when serum creatinine is changing rapidly. It is not useful in  patients on dialysis. The eGFR calculation may not be applicable to patients at the low and high extremes of body sizes, pregnant women, and vegetarians.)  B-Type Natriuretic Peptide ([facility] (Result(s) reported on 13 Feb 2014 at 10:37AM.)  Cardiac:  28-Jun-15 10:04   CK, Total  33 (39-308 NOTE: NEW REFERENCE RANGE  09/20/2013)  CPK-MB, Serum 0.7 (Result(s) reported on 13 Feb 2014 at 12:42PM.)  Troponin I < 0.02 (0.00-0.05 0.05 ng/mL or less: NEGATIVE  Repeat testing in 3-6 hrs  if clinically indicated. >0.05 ng/mL: POTENTIAL  MYOCARDIAL INJURY. Repeat  testing in 3-6 hrs if  clinically indicated. NOTE: An increase or decrease  of 30% or more on serial  testing suggests a  clinically important change)  Routine Coag:  28-Jun-15 10:04   Prothrombin  16.7  INR 1.4 (INR reference interval applies to patients on anticoagulant therapy. A single INR therapeutic range for coumarins is not optimal for all indications; however, the suggested range for most indications is 2.0 - 3.0. Exceptions to the INR Reference Range may include: Prosthetic heart valves, acute myocardial infarction, prevention of myocardial infarction, and combinations of aspirin and anticoagulant. The need for a higher or lower target INR must be assessed individually. Reference: The Pharmacology and Management of the Vitamin K  antagonists: the seventh ACCP Conference on Antithrombotic and Thrombolytic Therapy. CheFYBOF.7510pt:126 (3suppl): [id number] HCT value >55% may artifactually increase the PT.  In one study,  the increase was an average of 25%. Reference:  "Effect on Routine and Special Coagulation Testing Values of Citrate  Anticoagulant Adjustment in Patients with High HCT Values."  American Journal of Clinical Pathology 2263;335:456-256.)  Activated PTT (APTT)  39.0 (A HCT value >55% may artifactually increase the APTT. In one study, the increase was an average of 19%. Reference: "Effect on Routine and Special Coagulation Testing Values of Citrate Anticoagulant Adjustment in Patients with High HCT Values." American Journal of Clinical Pathology 2006;126:400-405.)  Routine Hem:  28-Jun-15 10:04   WBC (CBC) 8.8  RBC (CBC)  3.61  Hemoglobin (CBC)  11.1  Hematocrit (CBC)  32.7  Platelet Count (CBC)  144 (Result(s) reported on 13 Feb 2014 at 10:15AM.)  MCV 91  MCH 30.8  MCHC 34.0  RDW 14.1   EKG:  EKG Interp. by me   Interpretation EKG shows afib, 77, poor r prog, old inf infarct.   Radiology Results: XRay:    28-Jun-15 10:12, Chest Portable Single View  Chest Portable Single View   REASON FOR EXAM:    chest pain  COMMENTS:       PROCEDURE: DXR - DXR PORTABLE CHEST SINGLE VIEW  - Feb 13 2014 10:12AM     CLINICAL DATA:  ems from home. woke up a little short of breath and  then chest pressure 6/10 started. pt took nitro x 3 and 324 aspirin  with no relief. ems gave an additional nitro x 3. pain now 4/10. hx  afib, chf, htn, MI x 2 last in 12/14, TIA. just completed 30 day ha    EXAM:  PORTABLE CHEST - 1 VIEW    COMPARISON:  01/26/2014  FINDINGS:  Mild interstitial and airspaceedema or infiltrates diffusely, left  greater than right, increased since previous exam. Mild  cardiomegaly.  Probable small pleural effusions. Adjacent consolidation/atelectasis  in the lung bases as before. Atheromatous aorta.  Previous CABG.     IMPRESSION:  1. Some increase in asymmetric edema or infiltrates.  2. Persistent cardiomegaly and probable small effusions.      Electronically Signed    By: Arne [place] M.D.    On: 02/13/2014 10:42         Verified By: Kandis Cocking, M.D.,    No Known  Allergies:   Vital Signs/Nurse's Notes: **Vital Signs.:   28-Jun-15 12:59  Vital Signs Type Admission  Temperature Temperature (F) 97.4  Celsius 36.3  Temperature Source oral  Pulse Pulse 75  Respirations Respirations 18  Systolic BP Systolic BP 389  Diastolic BP (mmHg) Diastolic BP (mmHg) 96  Mean BP 116  Pulse Ox % Pulse Ox % 100  Pulse Ox Activity Level  At rest  Oxygen Delivery 2L    Impression 1) Chest pain: etiology not clear, cardiac enz neg x 1, EKG unchanged. Known CAD, prior cardiac cath late 2014. No recent anginal sx apart from this AM Would continue rule out at this time. If he has contuinued pain through today/evening, may need lexiscan myoview  2. Syncope/Orthostatic hypotn:   recently finished 30 even monitor no significant arrhythmia on prelim, final pending Would continue florinef as he has significant orthostatic hypotension with florinef.  3.  Chronic diastolic CHF:   No recent admits for CHF, has been following his lasix regimen (sliding scale based on his weight) and doing well. -Would contionue outpt meds.  3.  CAD: chest pain as above, workup pending would continue low dose asa and eliquis  4.  HTN:   Stable on current meds.  5.  HL:   On statin.  6.  COPD:   O2 dependent.  Stable.  chronic cough  7.  PVD:  S/P bilat CEA.  Stable findings on recent carotid u/s  8.  AAA:  S/P endovascular stent grafting in Feb.  Stable CTA on 5/18.  9. Afib:   Rate-controlled and asymptomatic.  Anticoagulated with Eliquis. h/o TIAs and CVA off anticoagulation   Electronic Signatures: Ida Rogue (MD)  (Signed 28-Jun-15 13:47)  Authored: General Aspect/Present Illness, History and Physical Exam, Review of System, Family & Social History, Past Medical History, Home Medications, Labs, EKG , Radiology, Allergies, Vital Signs/Nurse's Notes, Impression/Plan   Last Updated: 28-Jun-15 13:47 by Ida Rogue (MD)

## 2014-12-10 NOTE — Discharge Summary (Signed)
PATIENT NAME:  Javier [name], Javier [name] MR#:  [id number] DATE OF BIRTH:  1935-10-24  DATE OF ADMISSION:  06/16/2014 DATE OF DISCHARGE:  06/17/2014  ADDENDUM: This is a continuation.  HOSPITAL COURSE: The patient is a 79 year old male admitted for syncope. He was disoriented at home and brought in by family. The patient has been having workup done for syncope and possible seizures with Dr. Melrose Nakayama and Dr. Melrose Nakayama did an EEG and started the patient on Keppra, and he comes in because of disorientation. We continued Keppra. The patient had MRI of the pain, which did not show acute changes and he also had a CAT scan of the head on admission which did not show any changes. The patient's chest x-ray did not show any pneumonia. The patient thought to have a seizure and he is seen by Dr. Irish Elders and continued on [place] which we did not increase the dose but monitored here in the hospital. The patient told me that he was taking prednisone dose helping for the muscle pains and whenever he takes prednisone he feels a little confused and prednisone always give him some confusional episodes  and he has been given prednisone recently for these joint pains. I told the patient to tell this information to his primary doctor so that they can give least possible dose of prednisone to help his symptoms. Anyway, the patient is continued on Keppra. I told the patient that he can follow up with Dr. Melrose Nakayama to see if he needs to be on Keppra for long time or not. The patient did not have any further workup. The patient discharged home with Keppra 750 twice daily.   PHYSICAL EXAMINATION: Showed: CARDIOVASCULAR: S1, S2 regular.  LUNGS: Clear to auscultation. No wheeze. No rales.  NEUROLOGIC: He was alert, awake, oriented. No focal neurological deficit. His cranial nerves were normal, II-XII intact. Power 5/5 in upper and lower extremities. Sensory intact. DTRs 2+ bilaterally.  PSYCHIATRIC: Mood and affect are within normal limits.  VITAL  SIGNS: The patient's blood pressure at the time of discharge 125/75, heart rate 88, temperature 97.6, saturation is 95% on room air.   LABORATORY DATA: The patient's electrolytes and CBC were normal and UA was negative for infection.  TIME SPENT: More than 35 minutes.    ____________________________ Epifanio Lesches, MD sk:TT D: 06/21/2014 13:51:01 ET T: 06/21/2014 22:54:27 ET JOB#: [id number]  cc: Epifanio Lesches, MD, <Dictator> Epifanio Lesches MD ELECTRONICALLY SIGNED 06/24/2014 16:14

## 2014-12-10 NOTE — Consult Note (Signed)
General Aspect 79 y/o male with a h/o CAD s/p CABG, chronic orthostasis with periodic syncope, chronic afib on eliquis, and PAD who was admitted yesterday following a syncopal episode. ___________________________ ___________________________   Present Illness 79 y/o male with a h/o CAD s/p CABG in 2007.  His last cath was in 06/2013, revealing patent LM stent with 2/3 patent grafts (known occlusion to the VG->Diag).  He also has a h/o chronic diast chf (EF 50-55% 09/2013), afib (on eliquis), Rem tobacco abuse (1/2 ppd x 50 yrs, quit 2014) with O2 dependent COPD and chronic bronchitis, AAA s/p endovascular repair 09/2013, HTN, TIA s/p bilat CEA (2002), and a long h/o orthostatic hypotn with intermittent sycnope on florinef therapy.   He was last admitted to [facility] in March for diast chf exacerbation.  He was effectively diuresed and since that discharge, he has done reasonably well.  His weight has been stable and he has been adjusting his home lasix dose accordingly.  He denies pnd, orthopnea, n, v, or early satiety.  He does have some degree of chronic LEE and where TEDS with good relief of edema.  In early May, he was sitting in the pulmonology office and on check in was found to have a BP in the 80's - he was initially asymptomatic.  A f/u BP a few mins later was 120.  Within a few mins, he slumped over onto his wife.  He regained consciousness relatively quickly.  Repeat BP was nl.  He f/u with Dr. Rockey Situ on 5/7 and his florinef dose was increased back up to 0.79m daily.  They had been trying to wean his dose prior to that event, b/c of HTN and diast chf.  He reports fairly regular lightheadedness with standing but his syncopal episodes have never been preceded by presyncope.  Yesterday, while walking in his home, he experienced sudden syncope.  He had no presyncope or warning Ss.  His family heard him hit the floor and called EMS.  He regained consciousness w/in ~ 5 mins. No c/p, sob, n, v, diaph,  LH/dizziness. [facility] ECG afib w/o acute changes. Labs unrevealing. Tele - afib w/o pauses/ectopy.   Physical Exam:  GEN pleasant, nad.   HEENT pink conjunctivae, moist oral mucosa   NECK supple  No masses  no bruits/jvd.   RESP normal resp effort  clear BS   CARD Irregular rate and rhythm  Normal, S1, S2  No murmur   ABD denies tenderness  soft  normal BS   LYMPH negative neck   EXTR negative cyanosis/clubbing, negative edema   SKIN normal to palpation   NEURO grossly intact, nonfocal.   PSYCH alert, A+O to time, place, person, good insight   Review of Systems:  General: No Complaints   Skin: No Complaints   ENT: No Complaints   Eyes: No Complaints   Neck: No Complaints   Respiratory: frequent bronchitis exacerbations. chronic doe-  on home O2.   Cardiovascular: syncope as per HPI.   Gastrointestinal: has been having post-prandial abd pain ever since AAA stenting in 09/2013 - CTA on 01/03/14 showed no acute findings.   Genitourinary: No Complaints   Vascular: No Complaints   Musculoskeletal: No Complaints   Neurologic: No Complaints   Hematologic: No Complaints   Endocrine: No Complaints   Psychiatric: No Complaints   Review of Systems: All other systems were reviewed and found to be negative   Medications/Allergies Reviewed Medications/Allergies reviewed   Family &  Social History:  Family and Social History:  Family History mother died with MI in her early 92's.  Father died of cancer in his 43's.   Social History smoked ~ 1/3-1/2 ppd x 50+ yrs, quit 2014. No etoh/drugs.   Place of Living Home  Lives in [place] with wife, dtr, son-in-law.     Remote Tobacco Abuse: a. 1/3 - 1/2 ppd x 50+ yrs.  Quit 2014.   AAA: a. 09/2013 s/p endovascular stenting.   COPD with recurrent Bronchitis: a. On Home O2.   Orthostatic Hypotension and Syncope: a. On florinef;  b. 09/2013 Echo: EF 50-55%.   Permanent Afib: a. CHA2DS2VASc = 7,  On eliquis.   Chronic Diastolic CHF: a. 02/4943 Echo: EF 50-55%, mild LVH, low nl RV fxn, mild to mod MR/TR.   Carotid Arterial Dzs: a. 2002 s/p bilat CEA.   TIA/CVA: a. 2002 s/p bilat CEA.   CAD: a. s/p CABG 2007;  b. s/p prior stenting including LM stenting;  c. 06/2013 Myoview: Lat ischemia;  d.  06/2013 Cath: LM stent patent, LIMA->LAD ok, VG->OM3 ok, VG->Diag known to be occluded.   Anemia, Chronic:    prostate cancer with brachytherapy:    HTN:    lasik surgery both eyes:   Home Medications: Medication Instructions Status  lisinopril 2.5 mg oral tablet 1 tab(s) orally once a day Active  DuoNeb 0.5 mg-2.5 mg/3 mL inhalation solution 3 milliliter(s) inhaled every 4 hours, As Needed - for Shortness of Breath Active  Klor-Con M20 oral tablet, extended release 1 tab(s) orally once a day Active  bisoprolol 5 mg oral tablet 1 tab(s) orally 2 times a day Active  furosemide 40 mg oral tablet 1 tab(s) orally once a day Active  Symbicort 160 mcg-4.5 mcg/inh inhalation aerosol 2 puff(s) inhaled 2 times a day Active  Eligard 45 mg/6 months subcutaneous injection, extended release 45 milligram(s) subcutaneous every 6 months on the first of April and October Active  ferrous sulfate 325 mg oral tablet 1 tab(s) orally 2 times a day Active  Nitrostat 0.4 mg sublingual tablet 1 tab(s) sublingual every 5 minutes up to 3 doses as needed for chest pain. *if no relief call md or go to emergency room* Active  fludrocortisone 0.1 mg oral tablet 1 tab(s) orally once a day (in the morning) Active  Zoloft 50 mg oral tablet 1 tab(s) orally once a day (in the morning) Active  Crestor 20 mg oral tablet 1 tab(s) orally once a day (in the evening) Active  Fish Oil 1000 mg oral capsule 1 cap(s) orally 2 times a day Active  docusate sodium 100 mg oral tablet 1 tab(s) orally 2 times a day Active  montelukast 10 mg oral tablet 1 tab(s) orally once a day (at bedtime)  Active  Ventolin HFA 90 mcg/inh inhalation  aerosol 2 puff(s) inhaled every 4 hours, As Needed - for Shortness of Breath Active  aspirin 81 mg oral delayed release tablet 1 tab(s) orally once a day (in the morning) Active  Vitamin C 1000 mg oral tablet 1 tab(s) orally once a day (in the morning) Active  Spiriva 18 mcg inhalation capsule 1 caps via handihaler once a day (in the morning) Active  Senna 8.6 mg oral tablet 1 tab(s) orally once a day (in the morning) Active  Mucinex 600 mg oral tablet, extended release 1 tab(s) orally 2 times a day, As Needed for cough/congestion Active  codeine-guaiFENesin 10 mg-100 mg/5 mL oral syrup 10 milliliter(s) orally every 4  hours as needed for pain from coughing Active  acetaminophen-HYDROcodone 325 mg-5 mg oral tablet 1 tab(s) orally every 4 hours as needed for severe pain from coughing Active  apixaban 5 mg oral tablet 1 tab(s) orally 2 times a day Active  PriLOSEC 20 mg oral delayed release tablet 2 tab(s) orally once a day (in the morning) Active  ALPRAZolam 0.5 mg oral tablet 1 tab(s) orally 3 times a day Active   Lab Results:  Routine Chem:  21-May-15 21:47   Glucose, Serum 80  BUN 18  Creatinine (comp) 1.21  Sodium, Serum 141  Potassium, Serum 4.3  Chloride, Serum 103  CO2, Serum  34  Calcium (Total), Serum 9.1  Anion Gap  4  Osmolality (calc) 282  eGFR (African American) >60  eGFR (Non-African American)  58 (eGFR values <19m/min/1.73 m2 may be an indication of chronic kidney disease (CKD). Calculated eGFR is useful in patients with stable renal function. The eGFR calculation will not be reliable in acutely ill patients when serum creatinine is changing rapidly. It is not useful in  patients on dialysis. The eGFR calculation may not be applicable to patients at the low and high extremes of body sizes, pregnant women, and vegetarians.)  Cardiac:  21-May-15 21:47   Troponin I < 0.02 (0.00-0.05 0.05 ng/mL or less: NEGATIVE  Repeat testing in 3-6 hrs  if clinically  indicated. >0.05 ng/mL: POTENTIAL  MYOCARDIAL INJURY. Repeat  testing in 3-6 hrs if  clinically indicated. NOTE: An increase or decrease  of 30% or more on serial  testing suggests a  clinically important change)  CPK-MB, Serum  < 0.5 (Result(s) reported on 07 Jan 2014 at 07:38AM.)  22-May-15 02:09   Troponin I < 0.02 (0.00-0.05 0.05 ng/mL or less: NEGATIVE  Repeat testing in 3-6 hrs  if clinically indicated. >0.05 ng/mL: POTENTIAL  MYOCARDIAL INJURY. Repeat  testing in 3-6 hrs if  clinically indicated. NOTE: An increase or decrease  of 30% or more on serial  testing suggests a  clinically important change)  CPK-MB, Serum 0.6 (Result(s) reported on 07 Jan 2014 at 02:54AM.)    05:55   Troponin I < 0.02 (0.00-0.05 0.05 ng/mL or less: NEGATIVE  Repeat testing in 3-6 hrs  if clinically indicated. >0.05 ng/mL: POTENTIAL  MYOCARDIAL INJURY. Repeat  testing in 3-6 hrs if  clinically indicated. NOTE: An increase or decrease  of 30% or more on serial  testing suggests a  clinically important change)  CPK-MB, Serum  < 0.5 (Result(s) reported on 07 Jan 2014 at 06:42AM.)  Routine UA:  22-May-15 03:38   Color (UA) Yellow  Clarity (UA) Clear  Glucose (UA) Negative  Bilirubin (UA) Negative  Ketones (UA) Negative  Specific Gravity (UA) 1.018  Blood (UA) Negative  pH (UA) 5.0  Protein (UA) Negative  Nitrite (UA) Negative  Leukocyte Esterase (UA) Negative (Result(s) reported on 07 Jan 2014 at 07:02AM.)  RBC (UA) 3 /HPF  WBC (UA) <1 /HPF  Bacteria (UA) NONE SEEN  Epithelial Cells (UA) NONE SEEN  Calcium Oxalate Crystal (UA) PRESENT (Result(s) reported on 07 Jan 2014 at 07:02AM.)  Routine Hem:  21-May-15 21:47   WBC (CBC) 9.6  RBC (CBC)  3.98  Hemoglobin (CBC)  12.2  Hematocrit (CBC)  36.7  Platelet Count (CBC) 160  MCV 92  MCH 30.6  MCHC 33.2  RDW 14.5  Neutrophil % 76.5  Lymphocyte % 14.6  Monocyte % 6.8  Eosinophil % 1.3  Basophil % 0.8  Neutrophil #  7.3   Lymphocyte # 1.4  Monocyte # 0.7  Eosinophil # 0.1  Basophil # 0.1 (Result(s) reported on 06 Jan 2014 at 10:01PM.)   EKG:  EKG Interp. by me   Interpretation afib, 87, poor r prog, old inf infarct.   Radiology Results: [place]:    22-May-15 09:25, US Carotid Doppler Bilateral  US Carotid Doppler Bilateral   REASON FOR EXAM:    syncope  COMMENTS:       PROCEDURE: [place]  - US CAROTID DOPPLER BILATERAL  - Jan 07 2014  9:25AM     CLINICAL DATA:  Syncope    EXAM:  BILATERAL CAROTID DUPLEX ULTRASOUND    TECHNIQUE:  Pearline Cables scale imaging, color Doppler and duplex ultrasound were  performed of bilateral carotid and vertebral arteries in the neck.    COMPARISON:  10/09/2013  FINDINGS:  Criteria: Quantification of carotid stenosis is based on velocity  parameters that correlate the residual internal carotid diameter  with NASCET-based stenosis levels, using the diameter of the distal  internal carotid lumen as the denominator for stenosis measurement.    The following velocity measurements were obtained:    RIGHT    ICA:  78 cm/sec    CCA:  79 cm/sec    SYSTOLIC ICA/CCA RATIO:  9.93  DIASTOLIC ICA/CCA RATIO:    ECA:  49 cm/sec    LEFT    ICA:  67 cm/sec    CCA:  69 cm/sec    SYSTOLIC ICA/CCA RATIO:  5.70    DIASTOLIC ICA/CCA RATIO:    ECA:  67 cm/sec  RIGHT CAROTID ARTERY: Mild focal plaque in the common carotid.  Minimal calcified plaque in the bulb. Low resistance internal  carotid Doppler pattern with some turbulence. Rhythm is irregular.    RIGHT VERTEBRAL ARTERY:  Antegrade.  Low resistance Doppler pattern.    LEFT CAROTID ARTERY: Scattered mild plaque in the common carotid.  Mild calcified plaque in the bulb. Low resistance internal carotid  Doppler pattern.    LEFT VERTEBRAL ARTERY:  Antegrade.  Normal Doppler pattern.     IMPRESSION:  Less than 50% stenosis in the right and left internal carotid  arteries.  Irregular rhythm.  Consider atrial  fibrillation.      Electronically Signed    By: Maryclare Bean M.D.    On: 01/07/2014 09:31         Verified By: Jamas Lav, M.D.,  CT:    21-May-15 22:28, CT Cervical Spine Without Contrast  CT Cervical Spine Without Contrast   REASON FOR EXAM:    fall, +LOC  COMMENTS:       PROCEDURE: CT  - CT CERVICAL SPINE WO  - Jan 06 2014 10:28PM     CLINICAL DATA:  [place].  Syncopal episode.  Loss of consciousness.    EXAM:  CT HEAD WITHOUT CONTRAST    CT CERVICAL SPINE WITHOUT CONTRAST    TECHNIQUE:  Multidetector CT imaging of the head and cervical spine was  performed following the standard protocol without intravenous  contrast. Multiplanar CT image reconstructions of the cervical spine  were also generated.    COMPARISON:  None.    FINDINGS:  CT HEAD FINDINGS    Diffusely enlarged ventricles and subarachnoid spaces. Patchy white  matter low density in both cerebral hemispheres. No skull fracture,  intracranial hemorrhage or paranasal sinus air-fluid levels.    CT CERVICAL SPINE FINDINGS    Mild degenerative changes at multiple levels. No prevertebral soft  tissue  swelling, fractures or subluxations. Right carotid artery  calcifications. Surgical clips adjacent to both carotid arteries,  compatible with previous endarterectomies. Mild right apical pleural  and parenchymal scarring.     IMPRESSION:  1. No acute abnormality.  2. Atrophy and chronic small vessel white matter ischemic changes.  3. Mild cervical spine degenerative changes.      Electronically Signed    By: Enrique Sack M.D.    On: 01/06/2014 22:34     Verified By: Gerald Stabs, M.D.,    21-May-15 22:28, CT Head Without Contrast  CT Head Without Contrast   REASON FOR EXAM:    fall with LOC  COMMENTS:       PROCEDURE: CT  - CT HEAD WITHOUT CONTRAST  - Jan 06 2014 10:28PM     CLINICAL DATA:  [place].  Syncopal episode.  Loss of consciousness.    EXAM:  CT HEAD WITHOUT CONTRAST    CT CERVICAL SPINE  WITHOUT CONTRAST    TECHNIQUE:  Multidetector CT imaging of the head and cervical spine was  performed following the standard protocol without intravenous  contrast. Multiplanar CT image reconstructions of the cervical spine  were also generated.    COMPARISON:  None.    FINDINGS:  CT HEAD FINDINGS    Diffusely enlarged ventricles and subarachnoid spaces. Patchy white  matter low density in both cerebral hemispheres. No skull fracture,  intracranial hemorrhage or paranasal sinus air-fluid levels.    CT CERVICAL SPINE FINDINGS    Mild degenerative changes at multiple levels. No prevertebral soft  tissue swelling, fractures or subluxations. Right carotid artery  calcifications. Surgical clips adjacent to both carotid arteries,  compatible with previous endarterectomies. Mild right apical pleural  and parenchymal scarring.     IMPRESSION:  1. No acute abnormality.  2. Atrophy and chronic small vessel white matter ischemic changes.  3. Mild cervical spine degenerative changes.      Electronically Signed    By: Enrique Sack M.D.    On: 01/06/2014 22:34     Verified By: Gerald Stabs, M.D.,    No Known Allergies:   Vital Signs/Nurse's Notes: **Vital Signs.:   22-May-15 08:30  Vital Signs Type Pre Medication  Temperature Temperature (F) 97.6  Celsius 36.4  Pulse Pulse 86  Respirations Respirations 18  Systolic BP Systolic BP 527  Diastolic BP (mmHg) Diastolic BP (mmHg) 99  Mean BP 118  Pulse Ox % Pulse Ox % 97  Pulse Ox Activity Level  At rest  Oxygen Delivery 2L    78:24  Systolic BP Systolic BP 235  Diastolic BP (mmHg) Diastolic BP (mmHg) 77  Pulse Lying Pulse Lying 86  Systolic BP Systolic BP 361  Diastolic BP (mmHg) Diastolic BP (mmHg) 83  Pulse Pulse Sitting 91  Systolic BP Systolic BP 443  Diastolic BP (mmHg) Diastolic BP (mmHg) 67  Pulse Standing Pulse Standing 80  *Intake and Output.:   Daily 22-May-15 07:00  Grand Totals Intake:  240 Output:  400     Net:  -160 24 Hr.:  -160  Oral Intake      In:  240  Urine ml     Out:  400  Length of Stay Totals Intake:  240 Output:  400    Net:  -160    Impression 1. Syncope/Orthostatic hypotn:  Pt has now had 2 syncopal episodes within the past 3-4 wks.  Fortunately, he has not sustained any significant injuries.  The episode earlier this month  occurred while sitting and his BP was documented to be low prior to the episode.  Yesterday he was walking in his home. He says that he has probably had 5-6 syncopal episodes in his lifetime and although he does experience lightheadedness upon standing pretty regularly, he has never had any prodromal Ss prior his syncopal spells.  Yesterday, he thinks he was out for ~ 5 mins and then was very groggy but he did not have c/p, dyspnea, n, v, diaphoresis.  His BP's here have been stable and lab work is unrevealing.  Head CT non-acute.  ECG stable.  Though ongoing periodic orthostatic hypotn remains a strong possibility for the cause of his syncope, he has never been shown not to have a significant arrhythmia during an event.  He has worn an outpt monitor in the past but did not have an episode of syncope.  We can arrange for an outpt event monitor and if he fails to have recurrent syncope while wearing it, we can also consider referral to EP for an implantable loop recorder.  In the meantime, continue his home dose of florinef.  Dr. Rockey Situ has previously stated that he'd like to keep his resting BP in the 150 range.  He no longer drives.  2.  Chronic diastolic CHF:  [place] looks good.  He adjust his lasix dose home dependent upon weight and he appears to be doing a good job.  HR/BP stable.  Cont BB/ACEI.  3.  CAD: No c/p.  Cont low-dose asa, bb, acei, statin.  4.  HTN:  Stable on current meds.  5.  HL:  On statin.  6.  COPD:  O2 dependent.  Stable.  Denies dyspnea prior to syncopal spells, making hypoxia less likely to be the primary cause.  7.  CVD: S/P bilat CEA.   Stable findings on carotid u/s today.  Head CT non-acute.  8.  AAA: S/P endovascular stent grafting in Feb.  Stable CTA on 5/18.  9. Afib:  Rate-controlled and asymptomatic.  Anticoagulated with Eliquis.   Plan Attending note:  Agree with findings above.  he has episodes of syncope that clinically do not sound like his typical orthostatic hypotension.   I think that an event monitor or implantable loop recorder will be very usefull in seeing what is doing on during these episodes.   Marily Lente, MD.   Electronic Signatures: Rogelia Mire (NP)  (Signed [phone] 11:50)  Authored: General Aspect/Present Illness, History and Physical Exam, Review of System, Family & Social History, Past Medical History, Home Medications, Labs, EKG , Radiology, Allergies, Vital Signs/Nurse's Notes, Impression/Plan Nahser, Dreama Saa (MD)  (Signed 22-May-15 13:36)  Authored: Impression/Plan  Co-Signer: General Aspect/Present Illness, History and Physical Exam, Review of System, Family & Social History, Past Medical History, Home Medications, Labs, EKG , Radiology, Allergies, Vital Signs/Nurse's Notes, Impression/Plan   Last Updated: 22-May-15 13:36 by Acie Fredrickson Dreama Saa (MD)

## 2014-12-10 NOTE — Discharge Summary (Signed)
PATIENT NAME:  Javier [name], Javier [name] MR#:  [id number] DATE OF BIRTH:  February 17, 1936  DATE OF ADMISSION:  10/10/2013 DATE OF DISCHARGE:  10/15/2013  ADMISSION DIAGNOSIS: Transient ischemic attack.   DISCHARGE DIAGNOSES: 1.  Right hemispheric transient ischemic attack.  2.  Abdominal aortic aneurysm status post repair.  3.  History of transient ischemic attack/cerebrovascular accident. 4.  History of atrial fibrillation.  CONSULTATIONS:  1.  Dr. Raul Del.  2.  Dr. Delana Meyer. 3.  Dr. Rockey Situ.  4.  Gastroenterology.   PROCEDURES: The patient underwent repair and stent of his triple-A.   EGD, 10/11/2013, showed gastritis, duodenitis, and small bowel AVMs.  DIAGNOSTIC DATA:  Discharge white blood cells 10.9, hemoglobin 10, hematocrit 31, platelets 256,000.   CT on 10/15/2013: Fusiform intrarenal abdominal aortic aneurysm measures up to 5 cm in maximal transverse dimension accounting for the posterior angulation of the aorta.   A 2-D echocardiogram: EF 50% to 55% with mildly dilated left atrium and moderate mitral valve regurg and moderate tricuspid regurgitation.   Carotid Doppler showed no hemodynamically significant stenosis.   HOSPITAL COURSE: This is a 79 year old male with history of atrial fibrillation on Coumadin and numerous TIAs in the past who presented initially with weakness. For further details, please refer to the H and P.  1.  Right hemispheric TIA. The patient's symptoms initially were consistent with a TIA.  Neurology was consulted. A CT was negative for a stroke. His Dopplers were as stated above. His echo did not show any source of emboli. He is on Coumadin as an outpatient for his atrial fibrillation and history of TIAs. After discussion with Dr. Rockey Situ, side effects, alternatives, benefits and risks were discussed with the patient about switching from Coumadin to Eliquis as the patient has been hard to maintain on Coumadin with therapeutic INR. The patient is aware of the side  effects and alternatives including bleeding and did want to try Eliquis.  He has been on Xarelto in the past which he has not tolerated. Appreciate neurology consult.  2.  Abdominal pain with abdominal aortic aneurysm. The patient is status post AAA repair. Today is postop day 2. He will follow up with vascular. There are no issues at this time.  3.  Gastritis/esophagitis. The patient had abdominal pain. EGD was performed which showed some gastritis, esophagitis and small bowel AVMs. The patient will continue on a PPI per gastroenterology.  4.  Atrial fibrillation/atrial flutter. The patient is now on Eliquis per Dr. Rockey Situ. His rate is controlled with metoprolol. 5.  COPD, which is stable.  6.  Hyperlipidemia. The patient continued on Crestor. 7.  History of diastolic heart failure, which was euvolemic and stable.   DISCHARGE MEDICATIONS: 1.  Symbicort 2 puffs b.i.d.  2.  Ferrous sulfate 325 mg b.i.d.  3.  Eligard 45 mg every 6 months, on April and October 1st.  4.  Montelukast 10 mg at bedtime. 5.  Ventolin 2 puffs q. 4 hours p.r.n.  6.  Morphine 0.25 mg q. 4 hours p.r.n.  7.  Nitroglycerin sublingual p.r.n. chest pain. 8.  Fludrocortisone 0.1 mg daily. 9.  Zoloft 50 mg daily. 10.  Crestor 20 mg daily.  11.  Fish oil 1000 mg b.i.d.  12.  Docusate 100 mg b.i.d.  13.  Lasix 20 mg in the morning p.r.n.  14.  Albuterol ipratropium 4 times a day p.r.n.  15.  Aspirin 81 mg daily. 16.  Vitamin C 1000 mg daily.  17.  Metoprolol 50 mg b.i.d..  18.  Spiriva 18 mcg daily.  19.  Senna 1 tablet daily.  20.  KCl 10 mEq p.r.n.  21.  Mucinex 600 mg b.i.d. p.r.n.  22.  Nystatin 5 mL q. 6 hours. 23.  Xanax 0.25 mg q. 6 hours p.r.n. anxiety.  24.  Acetaminophen/tramadol 325/37.5 one to two tablets p.r.n.  25.  Codeine/guaifenesin 10/110 mL q. 4 hours p.r.n. pain.  26.  Acetaminophen/hydrocodone 5/325 q. 4 hours p.r.n. pain.  27.  Prilosec 20 mg 2 tablets in the morning.  28.  Eliquis 5 mg b.i.d.    DISCHARGE HOME HEALTH: With physical therapy, nurse and nurse aide.   DISCHARGE DIET: Low sodium.   DISCHARGE ACTIVITY: As tolerated.   DISCHARGE FOLLOWUP: In 1 week with Dr. Rockey Situ, in 1 week with Dr. Delana Meyer, and in 1 week with Dr. Darlys Gales.   Plan of care was discussed with the patient and Dr. Rockey Situ.   TIME SPENT ON DISCHARGE: 40 minutes.  ____________________________ Donell Beers. Benjie Karvonen, MD spm:sb D: 10/15/2013 13:45:49 ET T: 10/15/2013 15:18:13 ET JOB#: [id number]  cc: Velera Lansdale P. Benjie Karvonen, MD, <Dictator> Minna Merritts, MD Katha Cabal, MD Francee Nodal, MD  Donell Beers Keren Alverio MD ELECTRONICALLY SIGNED 10/17/2013 21:15

## 2014-12-10 NOTE — H&P (Signed)
PATIENT NAME:  Javier [name], Javier [name] MR#:  [id number] DATE OF BIRTH:  [birth year]  DATE OF ADMISSION:  09/20/2013  PRIMARY CARE PHYSICIAN: Dr. Mila Palmer.   REFERRING PHYSICIAN: Dr. Thomasene Lot.   CHIEF COMPLAINT: Cough, sputum, shortness of breath for 2 days.   HISTORY OF PRESENT ILLNESS: A 79 year old Caucasian male with a history of COPD, CHF, Afib who presented to the ED with the above chief complaint. The patient is alert, awake, and oriented, in no acute distress. The patient has a cough, sputum, shortness of breath for the past 2 days. The patient denies any wheezing and no fever or chills. The patient is on Lasix p.r.n. for leg edema. The patient only had mild leg edema 2 days ago but denies any weight gain. No chest pain, palpitation, orthopnea, or nocturnal dyspnea. The patient was treated with antibiotics and steroids as an outpatient without improvement   PAST MEDICAL HISTORY:  1.  ASCVD status post CABG.  2.  COPD.  3.  Chronic diastolic CHF. 4.  Chronic AFib on anticoagulation.  5.  Previous history of CVA.  6.  PVD.   PAST SURGICAL HISTORY:  1.  ASCVD status post CABG. 2.  Status post left carotid endarterectomy.   SOCIAL HISTORY: Quit smoking 1 year ago. No alcohol drinking or illicit drugs.   FAMILY HISTORY: Positive for GI cancer, gout, CAD, diabetes.   ALLERGIES: None.   HOME MEDICATIONS: Please refer to the medication reconciliation list.   REVIEW OF SYSTEMS:  CONSTITUTIONAL: The patient denies any fever or chills. No headache or dizziness but has weakness.  EYES: No double vision or blurred vision.  ENT: No postnasal drip, slurred speech or dysphagia.  CARDIOVASCULAR: No chest pain, palpitation, orthopnea, or nocturnal dyspnea. Has some mild leg edema.  PULMONARY: Possible cough, sputum, shortness of breath. No hemoptysis.  GASTROINTESTINAL: No abdominal pain, nausea, vomiting, diarrhea. No melena or bloody stool.  GENITOURINARY: No dysuria, hematuria, or  incontinence.  SKIN: No rash or jaundice.  NEUROLOGY: No syncope, loss of consciousness or seizure.  HEMATOLOGY: No easy bruising or bleeding.  ENDOCRINE: No polyuria, polydipsia, heat or cold intolerance.   PHYSICAL EXAMINATION:  VITAL SIGNS: Temperature 98.6, blood pressure 169/86, pulse 103, O2 saturation 98% on  nasal cannula, 2 liters nightly.  GENERAL: Alert, awake, oriented, in no acute distress.  HEENT: Pupils round, equal and reactive to light and accommodation. Moist oral mucosa. Clear oropharynx.  NECK: Supple. No JVD or carotid bruits are noted. No lymphadenopathy. No thyromegaly.  CARDIOVASCULAR: S1, S2 regular rate, rhythm. No murmurs or gallops.  PULMONARY: Bilateral air entry, bilateral crackles, mild expiratory wheezing. No use of accessory muscles to breathe.  ABDOMEN: Soft. No distention. No tenderness. No organomegaly. Bowel sounds present.  EXTREMITIES: No edema, clubbing or cyanosis. No calf tenderness, bilateral pedal pulses present.  SKIN: No rash or jaundice.  NEUROLOGY: A and O x3. No focal deficit. Power 5/5. Sensory intact.   LABORATORY DATA: Troponin less than 0.02. WBC 11.1, hemoglobin 12.7, platelets 217, glucose 130, BUN 25, creatinine 1.07, electrolytes normal. BNP 3422. INR 4.1, magnesium 1.9.   Chest x-ray showed interval decrease in pulmonary edema with persistent basilar atelectasis.   EKG showed AFib at 107 BPM.   IMPRESSIONS:  1.  Chronic obstructive pulmonary disease exacerbation.  2.  Coagulopathy.  3.  Dehydration.  4.  Chronic diastolic congestive heart failure.  5.  Chronic atrial fibrillation.  6.  Peripheral vascular disease.   PLAN OF TREATMENT:  1.  The patient  will be admitted to telemetry floor. We will start Solu-Medrol,  nebulizer and continue Spiriva, Symbicort and Singulair.  2.  For history of congestive heart failure and the recent leg edema, we will start Lasix 20 mg IV b.i.d. and monitor BMP closely.  3.  For coagulopathy,  we will hold the Coumadin at this time and pharmacy to dose if  INR decreases to therapeutic range.  4.  Continue other home medications.   I discussed the patient's condition and plan of treatment with the patient.   CODE STATUS: THE PATIENT WANTS FULL CODE.   TIME SPENT: About 58 minutes.   ____________________________ Demetrios Loll, MD qc:np D: 09/20/2013 22:09:36 ET T: 09/20/2013 23:01:50 ET JOB#: [id number]  cc: Demetrios Loll, MD, <Dictator> Demetrios Loll MD ELECTRONICALLY SIGNED 09/22/2013 15:28

## 2014-12-10 NOTE — Consult Note (Signed)
General Aspect 79 year old male with h/o smoking 40 years, frequent admissions, h/o severe COPD exacerbations/bronchitis, chronic respiratory failure, CAD s/p CABG in 2007, occlusion of 2 VGs with PCI x4, persistent AF/flutter, chronic diastolic CHF,  PVD with bilateral carotid endarterectomies in 2002, h/o CVA/TIAs, h/o prostate CA s/p brachytherapy, h/o GIB/hematuria and anemia who presents with TIA sx, leg and arm weakness, INR 1.3.   He states that he started having left arm and left leg weakness, which started in the afternoon yesterday. symptoms have improved.  at the same time he started having abdominal pain and left leg pain. The ED physician did a CT scan of his abdomen, and the patient is noted to have an infrarenal abdominal aortic aneurysm with maximal diameter of 5.7 cm.  The patient otherwise denies any fevers, chills. No chest pain. He has chronic intermittent shortness of breath. He uses oxygen as needed and then at nighttime.  He has been admitted multiple times for for chest pain, A/C diastolic CHF, A/COPD and syncope- orthostatic/hypovolemia in 7-03/2013. He was seen again at [facility] 04/19/13 for left-sided hemiparesis, slurred speech and weakness. tPa was administered and he was transferred to [facility]. MRI revealed no evidence of infarct and he had near full neurological recovery. Cardioembolic source was suspected. Anticoagulation had been held previously d/t hematuria. He was started on full-dose ASA and discharged. He followed up with Dr. Rockey Situ. Coumadin started after a long discussion, ASA held, goal INR 1.8-2.2 to minimize bleeding risk. Repeat TIAs x 2 last month. No evidence of CVA on imaging. Manifested as L arm/ & L leg weakness.   Hospitalization in 04/2013 for atypical chest pain deemed to be musculoskeletal.  Admission 06/2013 with stress test and cardiac cath at thaty time, medical management recommended  Echocardiogram this admission with EF 55-60%, rhythm atrial fib, mild  LA dilatation, mild to moderate TR/MR, moderately elevated PASP   Present Illness PAST SURGICAL HISTORY: CABG, carotid endarterectomy, cardiac stent, Lasik surgery.    SOCIAL HISTORY: He quit smoking 2 years ago. Denies any alcohol drinking or illicit drug use.   FAMILY HISTORY: Significant for diabetes and coronary artery disease. Father with esophageal CA.   Physical Exam:  GEN no acute distress, obese   HEENT pink conjunctivae, PERRL, hearing intact to voice   NECK supple  No masses  trachea midline  no appreciable bruits   RESP normal resp effort  no use of accessory muscles  diffuse centralized rhonchi, most prominent in the RLL field with associated wheezing   CARD Irregular rate and rhythm  Normal, S1, S2  No murmur   ABD denies tenderness  soft  normal BS   EXTR negative cyanosis/clubbing, negative edema   SKIN normal to palpation, skin turgor good   NEURO follows commands, motor/sensory function intact   PSYCH alert, A+O to time, place, person   Review of Systems:  Subjective/Chief Complaint lega dn arm weakness, resolved, ABD discomfort   General: malaise   Skin: No Complaints   ENT: No Complaints   Eyes: No Complaints   Neck: No Complaints   Respiratory: Frequent cough   Cardiovascular: Chest pain or discomfort   Gastrointestinal: discomfort   Genitourinary: No Complaints   Vascular: No Complaints   Musculoskeletal: No Complaints   Neurologic: No Complaints   Hematologic: No Complaints   Endocrine: No Complaints   Psychiatric: No Complaints   Review of Systems: All other systems were reviewed and found to be negative   Medications/Allergies Reviewed Medications/Allergies reviewed  Home Medications: Medication Instructions Status  warfarin 1 mg oral tablet 1.5 tabs (1.88m) orally once a day (at bedtime) Active  ALPRAZolam 0.25 mg oral tablet 1 tab(s) orally every 6 hours, As Needed for anxiety Active  nystatin 100,000 units/mL oral  suspension 5 milliliter(s) orally every 6 hours Active  Symbicort 160 mcg-4.5 mcg/inh inhalation aerosol 2 puff(s) inhaled 2 times a day Active  Eligard 45 mg/6 months subcutaneous injection, extended release 45 milligram(s) subcutaneous every 6 months on the first of April and October Active  ferrous sulfate 325 mg oral tablet 1 tab(s) orally 2 times a day Active  Nitrostat 0.4 mg sublingual tablet 1 tab(s) sublingual every 5 minutes up to 3 doses as needed for chest pain. *if no relief call md or go to emergency room* Active  fludrocortisone 0.1 mg oral tablet 1 tab(s) orally once a day (in the morning) Active  Zoloft 50 mg oral tablet 1 tab(s) orally once a day (in the morning) Active  Crestor 20 mg oral tablet 1 tab(s) orally once a day (in the evening) Active  Fish Oil 1000 mg oral capsule 1 cap(s) orally 2 times a day Active  docusate sodium 100 mg oral tablet 1 tab(s) orally 2 times a day Active  furosemide 20 mg oral tablet 1 tab(s) orally once a day (in the morning), As Needed for swelling Active  albuterol-ipratropium 2.5 mg-0.5 mg/3 mL inhalation solution 1 vial (3 milliliters) via nebulizer 4 times a day, As Needed - for Shortness of Breath Active  montelukast 10 mg oral tablet 1 tab(s) orally once a day (at bedtime)  Active  Ventolin HFA 90 mcg/inh inhalation aerosol 2 puff(s) inhaled every 4 hours, As Needed - for Shortness of Breath Active  morphine 20 mg/mL oral concentrate 0.25 milliliter(s) orally every 4 hours, As Needed - for Pain Active  aspirin 81 mg oral delayed release tablet 1 tab(s) orally once a day (in the morning) Active  Vitamin C 1000 mg oral tablet 1 tab(s) orally once a day (in the morning) Active  metoprolol tartrate 50 mg oral tablet 1 tab(s) orally 2 times a day Active  Spiriva 18 mcg inhalation capsule 1 caps via handihaler once a day (in the morning) Active  PriLOSEC 20 mg oral delayed release tablet 1 tab(s) orally once a day (in the morning) Active  Senna  8.6 mg oral tablet 1 tab(s) orally once a day (in the morning) Active  potassium chloride 10 mEq oral tablet, extended release 1 tab(s) orally once a day, As Needed for swelling when taking lasix Active  Mucinex 600 mg oral tablet, extended release 1 tab(s) orally 2 times a day, As Needed for cough/congestion Active  Robitussin DM To Go 20 mg-200 mg/10 mL oral liquid 10 milliliter(s) orally every 4 hours, As Needed for cough Active  acetaminophen-traMADol 325 mg-37.5 mg oral tablet 1 tab(s) orally 1 to 2 times a day as needed for joint pain. Active  codeine-guaiFENesin 10 mg-100 mg/5 mL oral syrup 10 milliliter(s) orally every 4 hours as needed for pain from coughing Active  acetaminophen-HYDROcodone 325 mg-5 mg oral tablet 1 tab(s) orally every 4 hours as needed for severe pain from coughing Active   Lab Results:  Thyroid:  21-Feb-15 05:51   Thyroid Stimulating Hormone 2.85 (0.45-4.50 (International Unit)  ----------------------- Pregnant patients have  different reference  ranges for TSH:  - - - - - - - - - -  Pregnant, first trimetser:  0.36 - 2.50 uIU/mL)  Routine Chem:  21-Feb-15 05:51   Glucose, Serum 96  BUN  19  Creatinine (comp) 1.17  Sodium, Serum 140  Potassium, Serum 3.6  Chloride, Serum 107  CO2, Serum 29  Calcium (Total), Serum 8.8  Anion Gap  4  Osmolality (calc) 282  eGFR (African American) >60  eGFR (Non-African American) >60 (eGFR values <22m/min/1.73 m2 may be an indication of chronic kidney disease (CKD). Calculated eGFR is useful in patients with stable renal function. The eGFR calculation will not be reliable in acutely ill patients when serum creatinine is changing rapidly. It is not useful in  patients on dialysis. The eGFR calculation may not be applicable to patients at the low and high extremes of body sizes, pregnant women, and vegetarians.)  Cholesterol, Serum 154  Triglycerides, Serum  207  HDL (INHOUSE) 48  VLDL Cholesterol Calculated  41   LDL Cholesterol Calculated 65 (Result(s) reported on 09 Oct 2013 at 06:55AM.)  Routine Coag:  21-Feb-15 05:51   Prothrombin  16.9  INR 1.4 (INR reference interval applies to patients on anticoagulant therapy. A single INR therapeutic range for coumarins is not optimal for all indications; however, the suggested range for most indications is 2.0 - 3.0. Exceptions to the INR Reference Range may include: Prosthetic heart valves, acute myocardial infarction, prevention of myocardial infarction, and combinations of aspirin and anticoagulant. The need for a higher or lower target INR must be assessed individually. Reference: The Pharmacology and Management of the Vitamin K  antagonists: the seventh ACCP Conference on Antithrombotic and Thrombolytic Therapy. CEZMOQ.9476Sept:126 (3suppl): [id number] A HCT value >55% may artifactually increase the PT.  In one study,  the increase was an average of 25%. Reference:  "Effect on Routine and Special Coagulation Testing Values of Citrate Anticoagulant Adjustment in Patients with High HCT Values." American Journal of Clinical Pathology 2006;126:400-405.)  Routine Hem:  21-Feb-15 05:51   WBC (CBC) 8.3  RBC (CBC)  3.61  Hemoglobin (CBC)  10.8  Hematocrit (CBC)  33.0  Platelet Count (CBC)  78  MCV 91  MCH 29.9  MCHC 32.7  RDW  14.7  Neutrophil % 72.0  Lymphocyte % 20.3  Monocyte % 5.3  Eosinophil % 1.8  Basophil % 0.6  Neutrophil # 6.0  Lymphocyte # 1.7  Monocyte # 0.4  Eosinophil # 0.1  Basophil # 0.0 (Result(s) reported on 09 Oct 2013 at 06:47AM.)   EKG:  Interpretation EKG shows atrial fibrillation, LAD, no ST/T changes   Radiology Results: Cardiology:    21-Feb-15 09:22, Echo Doppler  Echo Doppler   REASON FOR EXAM:      COMMENTS:       [facility]  - Oct 09 2013  9:22AM     RESULT: Echocardiogram Report    Patient Name:   Javier BILLEDate of Exam: 10/09/2013  Medical Rec #:   [id number]               Custom1:  Date of Birth:  [birth year]             Height:       73.0 in  Patient Age:    89years              Weight:       219.0 lb  Patient Gender: M                     BSA:  2.63 m??    Indications: CVA  Sonographer:    Janalee Dane RCS  Referring Phys: Dustin Flock, H    Summary:   1. Rhythm is atrial fibrillation   2. Left ventricular ejection fraction, by visual estimation, is 50 to   55%.   3. Normal global left ventricular systolic function.   4. Mild left ventricular hypertrophy.   5. Normal RV size with low normal systolic function.   6. Mildly dilated left atrium.   7. Mildly dilated right atrium.   8. Mild to moderate mitral valve regurgitation.   9. Mild to moderate tricuspid regurgitation.  10. Moderately elevated pulmonary artery systolic pressure.  2D AND M-MODE MEASUREMENTS (normal ranges within parentheses):  Left Ventricle:          Normal  IVSd (2D):      1.78 cm (0.7-1.1)  LVPWd (2D):     1.19 cm (0.7-1.1) Aorta/LA:                  Normal  LVIDd (2D):     3.43 cm (3.4-5.7) Aortic Root (2D): 3.20 cm (2.4-3.7)  LVIDs (2D):     2.47 cm           Left Atrium (2D): 4.80 cm (1.9-4.0)  LV FS (2D):     28.0 %   (>25%)  LV EF (2D):     55.3 %   (>50%)                                    Right Ventricle:                            RVd (2D):        3.99 cm  SPECTRAL DOPPLER ANALYSIS (where applicable):  Aortic Insufficiency:  AI Half-time:  785 msec  AI Decel Rate: 1.24 m/s??  Tricuspid Valve and PA/RV Systolic Pressure: TR Max Velocity: 3.36 m/s RA   Pressure: 10 mmHg RVSP/PASP: 55.2 mmHg  PHYSICIAN INTERPRETATION:  Left Ventricle: The left ventricular internal cavity size was normal. LV   posterior wall thickness was normal. Mild left ventricular hypertrophy.   Global LV systolic function was normal. Left ventricular ejection   fraction, by visual estimation, is 50 to 55%.  Right Ventricle: The right ventricular size is normal.  Global RV systolic   function is low normal.  Left Atrium: The left atrium is mildly dilated.  Right Atrium: The right atrium is mildly dilated.  Pericardium: There is no evidence of pericardial effusion.  Mitral Valve: The mitral valve is normal in structure. Mild to moderate   mitral valve regurgitation is seen. Rutured chordae noted.  Tricuspid Valve: The tricuspid valve is normal. Mild to moderate   tricuspid regurgitation is visualized. The tricuspid regurgitant velocity   is 3.36 m/s, and with an assumed right atrial pressure of 10 mmHg, the     estimated right ventricular systolic pressure is moderately elevated at   55.2 mmHg.  Aortic Valve: The aortic valve is normal. Mild to moderate aortic valve   sclerosis/calcification is present, without any evidence of aortic   stenosis. Trivial aortic valve regurgitation is seen.  Pulmonic Valve: The pulmonicvalve is normal. Trace pulmonic valve   regurgitation.  Aorta: The aortic root and ascending aorta are structurally normal, with   no evidence of dilitation.    [id number] Ida Rogue MD  Electronically signed by [id number] Ida Rogue MD  Signature Date/Time: 10/09/2013/5:48:23 PM    *** Final ***  IMPRESSION: .        Verified By: Minna Merritts, M.D., MD  CT:    20-Feb-15 16:49, CT Abdomen Pelvis WO for Stone  CT Abdomen Pelvis WO for Stone   REASON FOR EXAM:    pt with new onset stroke.  puling sensation in   bothlegs  COMMENTS:   May transport without cardiac monitor    PROCEDURE: CT  - CT ABDOMEN /PELVIS WO (STONE)  - Oct 08 2013  4:49PM     CLINICAL DATA:  Abdominal pain, mental status change.    EXAM:  CT ABDOMEN AND PELVIS WITHOUT CONTRAST    TECHNIQUE:  Multidetector CT imaging of the abdomen and pelvis was performed  following the standard protocol without IV contrast.  COMPARISON:  None.    FINDINGS:  There is an infrarenal abdominal aortic aneurysm exhibiting mural  thrombus with maximal AP dimension  of 5.7 cm AP x 5.4 cm  transversely. This extends into the common iliac arteries which  measure just over 2 cm in diameter proximally. There is no evidence  of perianeurysmal leakage of blood.    The liver exhibits no focal mass nor ductal dilation. The  gallbladder, pancreas, spleen, partially distended stomach, adrenal  glands, and kidneys exhibit no acute abnormalities. There is likely  a cyst exophytic from the medial aspect of the midpole of the left  kidney with HU measurement of +11. This presumed cyst measures just  over 2 cm in greatest dimension. The non-opacified loops of small  and large bowel exhibit no evidence of ileus nor obstruction nor  acute inflammation. There are seed implants in the prostatic bed.  The partially distended urinary bladder is normal in appearance. The  lumbar vertebral bodies exhibit chronic compression of the body of  L2 which was seen on a previous MRI in December of 2014. There is  also chronic compression of the body of T11.    There is a moderate-sized right pleural effusion and trace left  pleural effusion. There is no pneumonia demonstrated at the lung  bases. The cardiac chambers are mildly enlarged.     IMPRESSION:  1. There is an infrarenal abdominal aortic aneurysm with maximal  diameter of 5.7 cm AP. There is no perianeurysmal leakage of blood.  2. There is no evidence of acute hepatobiliary or acute urinary  tract abnormality.  3. There is no evidence of bowel obstruction or ileus or acute  inflammation.  4. There is a moderate-sized right pleural effusion and trace left  pleural effusion.      Electronically Signed    By: David  Martinique    On: 10/08/2013 16:58         Verified By: DAVID A. Martinique, M.D., MD    No Known Allergies:   Vital Signs/Nurse's Notes: **Vital Signs.:   22-Feb-15 04:20  Vital Signs Type Routine  Temperature Temperature (F) 97.6  Celsius 36.4  Temperature Source oral  Pulse Pulse 76  Respirations  Respirations 18  Systolic BP Systolic BP 759  Diastolic BP (mmHg) Diastolic BP (mmHg) 92  Mean BP 118  Pulse Ox % Pulse Ox % 100  Pulse Ox Activity Level  At rest  Oxygen Delivery 2L    Impression 79 year old male with h/o smoking 40 years, frequent admissions for severe COPD exacerbations/bronchitis, chronic respiratory failure, CAD s/p CABG in 2007, occlusion of 2 VGs  with PCI x4, persistent AF/flutter, HFpEF, PVD with bilateral carotid endarterectomies in 2002, history of TIA in 2002, h/o prostate CA s/p brachytherapy, h/o GIB/hematuria and anemia who presents back to [facility] with TIA sx. leg adn arm weakness  1) AAA, >5 cm possible stent candidate, this would probably be the lowest risk procedure given the patients numerous medical issues, most worrisome is COPD and risk of being unable to wean of the vent. --Acceptable risk in terms of his cardiac issues for repair of AAA (endovascular or open)  Known severe CAD, though CAD stable on 2012 cath at [facility], and on last cath 06/2013  with medical management recommended at that time. No new chest pain sx concerning for angina.  --Would try to minimize IVF as he currently has moderate pulmonary by echo measurements and at some point we need to start gentle diuresis.   2. H/o syncope/orthostatic hypotension No further episodes of syncope/presyncope.  suspect secondary to overdiuresis, on BP meds -- Continue compression stockings  3. Chronic diastolic CHF echo showing moderately elevated RVSP. No significant SX. Would consider restarting diuretic possibly tomorrow at low dose, wait a day given CT scan x2 recently   Plan . 4. Persistent atrial fibrillation Rate-controlled. Recent cardioembolic CVA. Coumadin restarted. No abnormal bleeding. U/a w/o evidence of hematuria. INR 2.1 today. Goal 1.8-2.2.  -- Will adjust Coumadin to recent regimen- 74m daily, except 1.520mMon, Fri.  -- Rate-controlled off AVN blockers. Continue to monitor on  telemetry.  5 .CAD s/p CABG, multiple PCIs would continue asa 81 mg daily,  lovenox  6. H/o R-sided CVA/TIAs Sx resolved, multiple TIAs in the past --was off warfarin for elevated INR, now low at 1.3  7. CKD, stage III Stable.   8. COPD Stable.   Electronic Signatures: GoIda RogueMD)  (Signed 22-Feb-15 10:06)  Authored: General Aspect/Present Illness, History and Physical Exam, Review of System, Home Medications, Labs, EKG , Radiology, Allergies, Vital Signs/Nurse's Notes, Impression/Plan   Last Updated: 22-Feb-15 10:06 by GoIda RogueMD)

## 2014-12-10 NOTE — Discharge Summary (Signed)
PATIENT NAME:  Javier [name], Javier [name] MR#:  [id number] DATE OF BIRTH:  [birth year]  DATE OF ADMISSION:  05/08/2014 DATE OF DISCHARGE:  05/09/2014  PRIMARY CARE PHYSICIAN:  Dr. Mila Palmer.    CARDIOLOGIST: Dr. Rockey Situ.   FINAL DIAGNOSES:  1.  Chest pain, arthralgias, likely costochondritis and possible polymyalgia rheumatica.  2.  Chronic obstructive pulmonary disease and chronic respiratory failure.  3.  Hypokalemia, hypomagnesemia.  4.  History of cerebrovascular accident and atrial fibrillation.  5.  Anxiety.   MEDICATIONS ON DISCHARGE:  Symbicort 160/4.5 two puffs twice a day, Eligard 45 mg subcutaneous injection every 6 months Ventolin HFA 90 mcg 2 puffs every 4 hours as needed for shortness of breath, Nitrostat 0.4 mg every 5 minutes maximum of 3 doses, fludrocortisone 0.1 mg in the morning 3 times a week, Crestor 20 mg daily, fish oil 1000 mg twice a day, Colace 100 mg twice a day, Spiriva 18 mcg once a day, senna 8.6 one tablet daily, Mucinex 600 mg twice a day, Protonix 40 mg twice a day, bisoprolol 5 mg twice a day, ferrous sulfate 325 mg twice a day, aspirin 81 mg daily, Zantac 150 mg daily, Lasix 20 mg daily, Zoloft 50 mg 2 tablets daily, albuterol ipratropium 1 vial inhaled 4 times a day as needed, Xanax 0.5 mg 2 tablets every 6 hours as needed for anxiety, Imdur 30 mg daily, midodrine 5 mg 3 times a day as needed for low blood pressure, Eliquis 5 mg twice a day, nystatin swish and swallow 5 mL 4 times a day for 14 days as needed, prednisone 15 mg daily, I advised him to stay on 15 mg daily until seen by Dr. Mila Palmer to try to get him down to the lowest dose possible without joint pains.   HOME HEALTH: Resumed, physical therapy and nurse.   HOME OXYGEN: Yes, 2 liters nasal cannula.   DIET: Low sodium, regular consistency.   ACTIVITY: As tolerated.    FOLLOWUP:  With Dr. Mila Palmer in 1-2 weeks, cardiology as scheduled.    HOSPITAL COURSE: The patient was admitted as an  observation on 05/08/2014 and discharged 05/09/2014. The patient coming in with chest pain with history of coronary artery disease, also having body aches.   LABORATORY AND RADIOLOGICAL DATA DURING THE HOSPITAL COURSE: Included an INR of 1.3. BNP 2,799. Glucose 105, BUN 30, creatinine 1.14, sodium 143, potassium 3.2, chloride 108, CO2 of 24, calcium 8.4. White blood cell count of 13.1, hemoglobin and hematocrit 10.3 and 31.6, platelet count of 165,000. Chest x-ray showed mild vascular congestion with mild cardiomegaly, small left pleural effusion. Sedimentation rate 36. Magnesium 1.7, potassium 3.4. White blood cell count upon discharge of 14.7, hemoglobin 10.1, platelets 142,000. Creatinine 1.09.   HOSPITAL COURSE PER PROBLEM LIST:  1.  For the patient's chest pain cardiac enzymes x3 were negative. The patient also had arthralgias in the shoulders and hips, this could be polymyalgia rheumatica. The patient also had chest wall pain over the sternum bilaterally. I did give a dose of prednisone 20 mg stat and I will continue 15 mg a day until seen by Dr. Mila Palmer as outpatient. Sometimes polymyalgia rheumatica can have these pains that helps out with low-dose prednisone, the goal is to try to get him over to the lowest dose of prednisone as possible. His pain is not cardiac, but he does come in with this type of pain quite often. The patient is very anxious. Cardiology does not want to do any further procedures on  him at this point. Cardiac enzymes were negative, this was not a myocardial infarction.  2.  COPD in chronic respiratory failure. Respiratory status stable on chronic oxygen.  3.  Hypokalemia and hypomagnesemia, replaced during the hospital course.  4.  History of CVA and atrial fibrillation, on Eliquis.   5.  Anxiety, on Xanax.    TIME SPENT ON DISCHARGE: 35 minutes.     ____________________________ Tana Conch. Leslye Peer, MD rjw:bu D: 05/09/2014 15:54:54 ET T: 05/09/2014 17:08:55  ET JOB#: [id number]  cc: Tana Conch. Leslye Peer, MD, <Dictator> Orion Crook, MD Minna Merritts, MD   Marisue Brooklyn MD ELECTRONICALLY SIGNED 05/12/2014 14:02

## 2014-12-10 NOTE — H&P (Signed)
PATIENT NAME:  Javier [name], BIALAS MR#:  [id number] DATE OF BIRTH:  February 13, 1936  DATE OF ADMISSION:  10/30/2013   REFERRING PHYSICIAN: Dr. Jacqualine Code.  CHIEF COMPLAINT: Shortness of breath.   A 79 year old Caucasian gentleman with past medical history of AAA status post endovascular repair approximately three weeks ago, diastolic congestive heart failure, COPD on 2 liters nasal cannula continuously, this was recently changed one week ago by Dr. Raul Del, his pulmonologist, as well as coronary artery disease status post CABG, presenting with shortness of breath. He describes gradual worsening of shortness of breath for the last three days which is now occurring at rest. He also denotes having palpitations with associated shortness of breath, complains of cough, which is chronic and no change, nonproductive. Denies any fevers or chills. Denies any chest pain. Denies any recent sick contacts. He takes his Lasix on a p.r.n. basis and has not used it in the past few days. Also states that his weight is actually trending down about three pounds over the last two weeks. Denies any edema or orthopnea, though he does sleep in a propped up position  already, which is also unchanged. He recently saw his pulmonologist, Dr. Raul Del for shortness of breath about a week and a half ago. At that time was increased from 2 liters nasal cannula at bedtime to 2 liters nasal cannula oxygen continuously. Currently, he is without complaints after receiving breathing treatments in the Emergency Department, he is feeling improved. He also received Lasix and diuresed well.   REVIEW OF SYSTEMS: Currently: CONSTITUTIONAL: Denies fever, fatigue, weakness.  EYES: Denies blurred vision, double vision, eye pain.  EARS, NOSE, THROAT: Denies tinnitus, ear pain, hearing loss.  RESPIRATORY: Positive for cough, wheeze, shortness of breath described above. Denies any hemoptysis.  CARDIOVASCULAR: Denies chest pain, orthopnea, edema. Positive for  palpitations.  GASTROINTESTINAL: Denies nausea, vomiting, diarrhea, abdominal pain.  GENITOURINARY: Denies dysuria, hematuria.  ENDOCRINE: Denies nocturia or thyroid problems.  HEMATOLOGIC AND LYMPHATIC: Denies easy bruising, bleeding.  SKIN: Denies rashes or lesions.  MUSCULOSKELETAL: Denies pain in neck, back, shoulder, knees, hips arthritic symptoms.  NEUROLOGIC: Denies paralysis, paresthesias.  PSYCHIATRIC: Denies anxiety or depressive symptoms.   Otherwise, full review of systems performed by me is negative.   PAST MEDICAL HISTORY: AAA status post endovascular repair approximately three weeks ago, atrial fibrillation, chronic on Eliquis for anticoagulation, COPD on 2 liters nasal cannula at baseline, hyperlipidemia, diastolic congestive heart failure, coronary artery disease status post CABG and hypertension.   SOCIAL HISTORY: Remote tobacco use. Denies any alcohol or drug usage.   FAMILY HISTORY: Positive for throat cancer. Denies any known cardiovascular diseases.   ALLERGIES: No known drug allergies.   HOME MEDICATIONS: Fludrocortisone 0.1 mg p.o. daily, acetaminophen hydrocodone 325/5 mg p.o. once every four hours as needed for pain, aspirin 81 mg p.o. daily, Nitrostat 0.4 mg sublingual tablet every five minutes up to three doses as needed for chest pain. Apixaban 5 mg p.o. b.i.d., Zoloft 50 mg p.o. daily, Crestor 20 mg p.o. daily, Eligard 45 mg every six months subcutaneous injection, alprazolam 0.25 mg every six hours needed for anxiety, metoprolol 50 mg p.o. b.i.d., DuoNeb treatments 4 times daily p.r.n. as needed for shortness of breath, Spiriva  18 mcg inhalation once daily, Symbicort 160/4.5 mg inhalation 2 puffs b.i.d., Ventolin 90 mcg inhalation 2 puffs every four hours as needed for shortness of breath, Lasix 20 mg daily as needed for edema, ferrous sulfate 325 mg p.o. b.i.d., Colace 100 mg p.o. b.i.d., senna  8.6 mg p.o. daily, montelukast 10 mg p.o. at bedtime, potassium 10 mEq  on days when taking Lasix, fish oil 1000 mg p.o. b.i.d., Prilosec 20 mg 2 tablets daily, vitamin C 1000 mg p.o. daily.   PHYSICAL EXAMINATION: VITAL SIGNS: Temperature 98.3, heart rate 83, respirations 22, blood pressure 145/83, saturating 96% on supplemental O2. Weight 90.9 kg.  GENERAL: Well-nourished, well-developed gentleman, currently in no acute distress.  HEAD: Normocephalic, atraumatic.  EYES: Pupils equal, round and reactive to light.  Extraocular muscles intact. No scleral icterus.  MOUTH: Moist mucous membranes. Dentition intact. No abscess noted.  EARS, NOSE AND THROAT: Throat clear without exudates. No external lesions.  NECK: Supple. No thyromegaly. No nodules. No JVD.  PULMONARY: Bibasilar coarse breath sounds, rhonchi as well as scattered wheezing, most prominent over the left lower lobe. No use of accessory muscles. Good respiratory effort.  CHEST: Nontender to palpation.  CARDIOVASCULAR: S1, S2 irregular rate, irregular rhythm. No murmurs, rubs, or gallops. No edema. Pedal pulses 2+ bilaterally.  GASTROINTESTINAL: Soft, nontender, nondistended, no masses, positive bowel sounds. No hepatosplenomegaly.  MUSCULOSKELETAL: No swelling, clubbing, edema. Range of motion full in all extremities.  NEUROLOGIC: Cranial nerves II through XII intact. No gross neurological  deficits. Sensation intact. Reflexes intact.   SKIN: No ulcerations, lesions, rashes, cyanosis. Skin warm and dry. Turgor intact. PSYCHIATRIC: Mood and affect  within normal limits. The patient awake, alert and oriented x 3. Insight and judgment intact.   LABORATORY DATA: EKG performed revealing atrial fibrillation, heart rate 90s. No ST or T wave abnormalities. Chest x-ray performed, revealing cardiomegaly with  pulmonary venous congestion. Trace bilateral pleural effusions. CT chest performed which was negative for PE; however, findings consistent with congestive heart failure and bilateral pleural effusions as noted  on   chest x-ray.   Remainder of laboratory data: Sodium 140, potassium 3.3, chloride 104, bicarbonate 30, BUN 8, creatinine 1.09, glucose 108. Troponin I less than 0.02. WBC 11.5, hemoglobin 11.2, platelets 216.   ASSESSMENT AND PLAN: A 79 year old Caucasian gentleman with history of AAA status post endovascular repair, diastolic congestive heart failure, chronic obstructive pulmonary disease on chronic oxygen therapy, coronary artery status post coronary artery bypass graft, presenting with shortness of breath.  1. Acute on chronic diastolic congestive heart failure in the setting of atrial fibrillation. We will give diuresis with Lasix, follow I's and O's and daily weights Place on telemetry. Trend cardiac enzymes, consult his cardiologist Dr. Rockey Situ. Given his atrial fibrillation and palpitations, I suspect this is rate dependent heart failure. We will provide DuoNeb therapies. 2. Atrial fibrillation, rate -controlled, metoprolol, place on telemetry as above. Anticoagulation with apixaban.  3. Chronic obstructive pulmonary disease: Continue supplemental oxygen to keep oxygen saturation greater than 92%. DuoNeb therapy q.4 hours. Singulair. 4. Coronary artery disease: Continue his beta blocker, aspirin therapy and statin therapy.  5. Deep venous thrombosis prophylaxis. He is on apixaban and we will continue those.   THE PATIENT IS FULL CODE.   TIME SPENT: 50 minutes.    ____________________________ Aaron Mose. Jaslynne Dahan, MD dkh:sg D: 10/30/2013 21:25:54 ET T: 10/31/2013 08:17:41 ET JOB#: [id number]  cc: Aaron Mose. Tessia Kassin, MD, <Dictator> [place] Silveria Woodfin Ganja MD ELECTRONICALLY SIGNED 11/01/2013 2:44

## 2014-12-10 NOTE — Op Note (Signed)
PATIENT NAME:  Javier [name], DETWEILER MR#:  [id number] DATE OF BIRTH:  November 20, 1935  DATE OF PROCEDURE:  10/13/2013  PREOPERATIVE DIAGNOSES: 1.  Greater than 5 cm abdominal aortic aneurysm.  2.  History of coronary artery disease.  3.  Hypertension.  4.  Gastritis. 5.  History of chronic obstructive pulmonary disease.  POSTOPERATIVE DIAGNOSES: 1.  Greater than 5 cm abdominal aortic aneurysm.  2.  History of coronary artery disease.  3.  Hypertension.  4.  Gastritis. 5.  History of chronic obstructive pulmonary disease.  PROCEDURES: 1.  Ultrasound guidance for vascular access to bilateral femoral arteries, right by Dr. Delana Meyer, left by Dr. Lucky Cowboy.  2.  Catheter replacement to aorta from bilateral femoral approaches, right by Dr. Delana Meyer, left by Dr. Lucky Cowboy.  3.  Placement of a gore C3 Excluder endoprosthesis for repair of an aneurysm, primary right, 26 mm diameter proximal, 14 cm length with a 23 mm diameter contralateral limb.  Co-surgeons English as a second language teacher. 4.  Placement of a right iliac extender 12 mm diameter x 7 cm length, co-surgeons Schnier and Shalika Arntz.  5.  Pro-glide closure device bilateral femoral arteries, right by Dr. Delana Meyer, left by Dr. Lucky Cowboy.   SURGEONS: English as a second language teacher, co-surgeons.   ANESTHESIA: General.   BLOOD LOSS:  Approximately 100 mL.  FLUOROSCOPY TIME:  23 minutes.  CONTRAST USED:  65 mL.   INDICATION FOR PROCEDURE: A 79 year old male with a greater than 5 cm abdominal aortic aneurysm. He was recently admitted with epigastric pain found to have some gastritis, but given the large size of his aneurysm, he was evaluated in the hospital and found to be stable medically for repair so we went ahead and repaired his aneurysm. He had adequate anatomy for endovascular repair. Risks and benefits were discussed. Informed consent was obtained.   DESCRIPTION OF PROCEDURE: The patient is brought to the vascular suite, anesthesia provided a general anesthetic and his abdomen and groins were  then sterilely prepped and draped and a sterile surgical field was created. The femoral arteries were then visualized with ultrasound and accessed under direct ultrasound guidance without difficulty. Dr. Delana Meyer performed the access on the right and I performed the access on the left; 6-French sheaths were placed. We then placed J wires and 2 ProGlide devices were placed in a Perclose fashion in each femoral artery; 8-French sheaths were then placed. A pigtail catheter was placed up the right and AP aortogram was performed. This showed an infrarenal abdominal aortic aneurysm as expected from his CT scan. A 26 mm diameter x 14 cm length main body was then selected. We upsized to the large sheath on the right over an Amplatz Super Stiff wire and placed the device at the renal arteries. Magnified imaging is performed through a Kumpe catheter that I put up through the left sheath at the renal arteries with appropriate cranial correction. The stent graft was then deployed at the base of the renal arteries with them being roughly equal. I then used a Kumpe catheter to cannulate the contralateral gate. Initially there was some difficulty with cannulation, so we re-constrained the device and made it a standard deployment which allowed easy cannulation. We confirmed cannulation successfully with a twirling pigtail catheter in the main body. A magnified view was then deployed again at the renal arteries, and we reopened the device at the base of the renals with a stiff wire up to the marker peg, I measured out the left iliac. A 23 mm diameter x 14  cm length contralateral leg was deployed just above the hypogastric artery on the left. We then did a retrograde to the right sheath. We extended down with a right iliac extender using a 12 mm diameter x 7 cm length extender. All junction points and seal zones were treated with the compliant balloon and a pigtail catheter was then placed back up the left and a completion angiogram  was then performed. This showed patent renal arteries bilaterally and hypogastric arteries bilaterally. The stent graft was widely patent without any evidence of type 1, 2 or 3 endoleaks seen. At this point, we elected to terminate the procedure. We completed the ProGlide arterial closure device in each groin with excellent hemostatic result. The small skin incisions were closed with 4-0 Monocryl and Dermabond was placed, as well as pressure-assist device. The patient was then awakened from anesthesia and taken to the recovery room in stable condition, having tolerated the procedure well.   ____________________________ Algernon Huxley, MD jsd:ce D: 10/13/2013 15:13:44 ET T: 10/13/2013 17:33:00 ET JOB#: [id number]  cc: Algernon Huxley, MD, <Dictator> Dr. Barbette Reichmann Algernon Huxley MD ELECTRONICALLY SIGNED 10/14/2013 12:03

## 2014-12-10 NOTE — Discharge Summary (Signed)
PATIENT NAME:  Javier [name], KINGS MR#:  [id number] DATE OF BIRTH:  [birth year]  DATE OF ADMISSION:  02/13/2014 DATE OF DISCHARGE:  02/15/2014  DISCHARGE DIAGNOSES: 1.  Musculoskeletal chest pain.  2.  Syncope. 3.  copd 4.  Chronic atrial fibrillation.  5.  Peripheral vascular disease. 6.  Hypertension.  7.  Hyperlipidemia.   DISCHARGE MEDICATIONS:  1.  Symbicort 160/4.5 two puffs b.i.d.  2.  (ELigard 45 mg  every 6 months 3.  Ventolin 2 puffs every 4 hours.  4.  Fluticasone 0.5 mg p.o. daily take 3 times a week. 5.  Crestor 20 mg p.o. daily. 6.  Fish oil 100 mg p.o. b.i.d.  7.  colace 100 mg 2  tab p.o. b.i.d.  8.  Spiriva 18 mcg inhalation.  9.  Nasonex 600 mg every 8 hours for mucus relief.  10. KCl 20 mg p.o. daily as needed when taking Lasix.  11.  Pantoprazole 40 mg p.o. b.i.d. 12.  Midodrine 5 mg p.o. t.i.d.  13.  Metoprolol 5 mg p.o. 2 times a day.  14.  Hydromet 1.5/5 mg 1 tsp every 6 hours as needed for painful cough.  15.  Ferrous sulfate 320 mg p.o. b.i.d.  16.  Aspirin 81 mg p.o. daily, discontinue while patient is on Eliquis. 17.  Zoloft 50 mg daily.  18.  Furosemide 20 mg every other day, 1 tablet. 19.  Eliquis  5 mg p.o. b.i.d.  20.  Zantac 150 mg p.o. daily.  21.  Xanax 0.5 mg 2 tablets once a day.  22.  Tramadol 50 mg p.o. b.i.d.( NEw medication.) 23.  Imdur 30 mg p.o. daily.   FOLLOW UP:  Patient advised to follow up with Dr. Esmond Plants on July 13 at 77:50 a.m.   CONSULTATIONS: Cardiology consult with Dr. Fletcher Anon and Dr. Esmond Plants.   HOSPITAL COURSE: A 79 year old male patient with history of coronary artery disease with CABG in 2007, came in because of chee st pain. The patient also complained to hav syncope. The patient's troponins have been negative x 3. Admitted to telemetry, seen by Dr. Esmond Plants and Dr. Fletcher Anon. The patient had a normal catheterization in November 2014, and ( started  Eliquis.  for chronic afib.The patient's troponins have been  negative  and chest pain thought to be secondary to musculoskeletal chest pain, started on Tramadol. The patient was seen in cardiology.  They did not give any further recommendations for work up for chest pain. The patient has syncope.  He has been on Florinef for  otrho static hypotension.  Patient advised to follow with cardiology regarding implantable Loop as an outpatient.   Chronic vascular heart failure, stable.  The patient advised to continue his home dose Lasix.   History of chronic atrial fibrillation, rate controlled. Continue Eliquis. Aspirin has been stopped and he had a history of TIAs and CVAs.   History of PVD, status post carotid endarterectomy.   Chronic obstructive pulmonary disease, oxygen dependent on 3 liters. Continue that.   The patient discharged home with home physical therapy. The patient already has ( [facility] health PT,  [facility].   TIME SPENT ON DISCHARGE PREPARATION: More than 30 minutes    ____________________________ Epifanio Lesches, MD sk:ts D: 02/16/2014 22:01:19 ET T: 02/17/2014 10:10:14 ET JOB#: [id number]  cc: Epifanio Lesches, MD, <Dictator> Epifanio Lesches MD ELECTRONICALLY SIGNED 03/03/2014 18:51

## 2014-12-10 NOTE — Consult Note (Signed)
Chief Complaint:  Subjective/Chief Complaint Pt nontes discomfort to "all over abdomen" 3/10 at worst.  Denies nausea or vomiting.   VITAL SIGNS/ANCILLARY NOTES: **Vital Signs.:   24-Feb-15 05:35  Vital Signs Type Routine  Temperature Temperature (F) 97.7  Celsius 36.5  Temperature Source oral  Pulse Pulse 93  Respirations Respirations 15  Systolic BP Systolic BP 106  Diastolic BP (mmHg) Diastolic BP (mmHg) 94  Mean BP 119  Pulse Ox % Pulse Ox % 96  Pulse Ox Activity Level  At rest  Oxygen Delivery 2L    11:34  Vital Signs Type Routine  Temperature Temperature (F) 98  Celsius 36.6  Temperature Source oral  Pulse Pulse 80  Respirations Respirations 18  Systolic BP Systolic BP 269  Diastolic BP (mmHg) Diastolic BP (mmHg) 79  Mean BP 95  Pulse Ox % Pulse Ox % 90  Pulse Ox Activity Level  At rest  Oxygen Delivery 2L   Brief Assessment:  GEN well developed, well nourished, no acute distress, A/Ox3.   Cardiac Regular   Respiratory normal resp effort   Gastrointestinal details normal Soft  Nontender  Nondistended  No masses palpable  Bowel sounds normal  No rebound tenderness  No gaurding   EXTR Trace pretibial edema bilaterally   Additional Physical Exam Skin: pale, warm, dry   Assessment/Plan:  Assessment/Plan:  Assessment Erosive esophagitis/gastritis: Pt should remain on PPI daily Small bowel AVMs: nonbleeding Anemia: Hgb stable.   Plan Continue PPI daily Monitor for GI bleeding on anticoagulation No further GI interventions at this time Will sign off, Please call if you have any questions or concerns   Electronic Signatures: Andria Meuse (NP)  (Signed 24-Feb-15 11:47)  Authored: Chief Complaint, VITAL SIGNS/ANCILLARY NOTES, Brief Assessment, Assessment/Plan   Last Updated: 24-Feb-15 11:47 by Andria Meuse (NP)

## 2014-12-10 NOTE — Consult Note (Signed)
PATIENT NAME:  Javier [name], Javier [name] MR#:  [id number] DATE OF BIRTH:  07-May-1936  DATE OF ADMISSION: 10/08/2013  DATE OF CONSULTATION:  10/10/2013  CONSULTING SERVICE: Gastroenterology   CONSULTING PHYSICIAN: Lucilla Lame, M.D.   REASON FOR CONSULTATION: Epigastric pain.   HISTORY OF PRESENT ILLNESS: This patient is a 79 year old gentleman who was admitted with left arm weakness and left leg weakness with abdominal pain. The patient also was found to have a history of COPD, and was discharged earlier this month. He had a CT scan that showed him to have an abdominal aortic aneurysm at 5.7 cm. The patient was recommended by Vascular Surgery to be admitted for possible intervention. The patient states that his abdominal pain is worse when he eats. He reports that it is a bloating feeling. There is no report of any unexplained weight loss, fevers, chills, nausea, vomiting or chest pain. The patient states he had a colonoscopy within the last year by Dr. 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 At the same time, the patient had an upper endoscopy that showed normal esophagus, normal stomach, and 2 AVMs in the duodenum. This was done on 11/09/2012. The patient states he is on anticoagulation at the present time, and is not reporting any sign of any bleeding.   PAST MEDICAL HISTORY: Diastolic congestive heart failure, COPD, chronic atrial fibrillation, which he is on Coumadin for, hypertension, prostate cancer, coronary artery disease, carotid endarterectomy, TIAs, peripheral vascular disease, morbid obesity, obstructive sleep apnea, history of CVA requiring t-PA.   PAST SURGICAL HISTORY:  CABG, carotid endarterectomy, cardiac stents, prostate surgery.   ALLERGIES:  No known drug allergies.   MEDICATIONS: Aspirin, Prilosec, senna, Zoloft, potassium, metoprolol, montelukast, Colace, Lasix, Symbicort, alprazolam,  Mucinex, Ventolin, Robitussin, tramadol, hydrocodone/ acetaminophen, Nitrostat.   SOCIAL HISTORY: Previous smoker, does not smoke any more. Denies alcohol or drug use.    REVIEW OF SYSTEMS:  Negative review of systems, except what was stated above.   PHYSICAL EXAMINATION: GENERAL:  The patient is sitting in bed, no apparent distress, speaking in full sentences.  VITAL SIGNS: Temperature 97.6, pulse 85, respirations 18, blood pressure 124/72, pulse oximetry 92%.  HEENT: Normocephalic, atraumatic. Extraocular movement intact. Pupils equally round, reactive to light and accommodation, without JVD, without lymphadenopathy.  LUNGS: Clear to auscultation bilaterally.  HEART: Regular rate and rhythm without murmurs, rubs or gallops.  ABDOMEN: Soft, nontender, without rebound, without guarding, without hepatosplenomegaly.  EXTREMITIES: Without cyanosis, clubbing or edema.  SKIN: Without any rashes or lesions.  NEUROLOGIC EXAM: Grossly intact.   ANCILLARY SERVICES: LFTs normal. Hemoglobin 11 on admission and 10.8 yesterday. INR 1.4 yesterday.   ASSESSMENT AND PLAN: This patient is a 79 year old gentleman who appears to be having intermittent transient ischemic attacks, and is admitted for repair of his abdominal aortic aneurysm. The patient has epigastric pain that could be from his stomach. The patient will undergo an EGD tomorrow because he will need further anticoagulation, if a repair of his AAA is done. The patient has been explained this, and will be kept n.p.o. after midnight.   Thank you very much for involving me in the care of this patient. If you have any questions, please do not hesitate to call.    ____________________________ Lucilla Lame, MD dw:mr D: 10/10/2013 16:32:49 ET T: 10/10/2013 18:44:37 ET JOB#: [id number]  cc: Lucilla Lame, MD, <Dictator> Lucilla Lame MD ELECTRONICALLY SIGNED 10/15/2013 8:13

## 2014-12-10 NOTE — H&P (Signed)
PATIENT NAME:  Javier [name], Javier [name] MR#:  [id number] DATE OF BIRTH:  1936/05/16  DATE OF ADMISSION:  02/22/2014  PRIMARY CARE PHYSICIAN:  Francee Nodal, MD  PRIMARY CARDIOLOGIST: Minna Merritts, MD  CHIEF COMPLAINT: Shortness of breath and chest pressure.   HISTORY OF PRESENT ILLNESS: This is a 79 year old Caucasian male patient with history of diastolic congestive heart failure, chronic obstructive pulmonary disease, chronic respiratory failure on 2 liters oxygen along with coronary artery disease and hypertension, transient ischemic attack, presents to the Emergency Room complaining of acute onset of chest pressure and shortness of breath since yesterday. The patient also has had some orthopnea, no lower extremity swelling. He was recently seen in the hospital 1 week prior for chest pressure which was thought to be musculoskeletal in origin, ruled out for acute coronary syndrome, seen by cardiology and discharged home. The patient mentions that he has had on-and-off chest pressure but has turned constant since yesterday. This does tend to get worse with taking a deep breath.   He has had a clear productive cough. He does take Lasix at home. But follows instructions per Dr. Rockey Situ where he weighs himself every day. His baseline weight seems to be around 216 pounds. He does take 20 mg of Lasix if he gains 2 pounds or 40 mg if he gains more than 4 pounds. He weighed himself yesterday at 220 pounds but did not take any extra dose of Lasix.   PAST MEDICAL HISTORY: 1.  Chronic obstructive pulmonary disease.  2.  Chronic respiratory failure, on 2 liters oxygen.  3.  Coronary artery disease.  4.  Chronic diastolic congestive heart failure with ejection fraction of 50% to 55%.  5.  Peripheral vascular disease.  6.  Obstructive sleep apnea.  7.  Hypertension.  8.  Transient ischemic attack.  9.  Morbid obesity.  10.  Prostate cancer.  11.  Chronic atrial fibrillation.  12.  Chronic  anticoagulation.   PAST SURGICAL HISTORY: 1.  LASIK.  2.  Prostate surgery.  3.  Cardiac stents.  4.  CABG.  5.  Carotid endarterectomy.   ALLERGIES: No known drug allergies.   FAMILY HISTORY: Esophageal and throat cancer in his father. No other cancers in the family. Myocardial infarction in his mother.   SOCIAL HISTORY: The patient smoked 1/2 pack a day for over 50 years. He did quit smoking in 2014. No alcohol. No illicit drugs. Lives at home with his wife.   REVIEW OF SYSTEMS: CONSTITUTIONAL: Complains of fatigue and weakness.  EYES: No blurred vision, pain, redness. ENT: No tinnitus, ear pain, hearing loss.  RESPIRATORY: Has chronic dry cough. No hemoptysis.  CARDIOVASCULAR: Has chest pressure, has had recurrent syncope.  GASTROINTESTINAL: No nausea, vomiting, diarrhea, abdominal pain.  GENITOURINARY: No dysuria, hematuria or frequency.  ENDOCRINE: No polyuria, nocturia, heat or cold intolerance.  HEMATOLOGIC AND LYMPHATIC: No anemia, easy bruising, bleeding.  INTEGUMENTARY: No acne, rash, lesion.  MUSCULOSKELETAL: No back pain or arthritis.  NEUROLOGIC: No focal numbness or weakness. PSYCHIATRIC:  Does have depression.   HOME MEDICATIONS:  1.  Albuterol nebulizer 4 times a day.  2.  Alprazolam 0.5 two tablets 2 times a day as needed.  3.  Aspirin 81 mg daily.  4.  Bisoprolol 5 mg 2 times a day.  5.  Crestor 20 mg daily.  6.  Docusate sodium 100 mg 2 times a day.  7.  Eligard 45 mg every 6 months subcutaneous.  7.  Eliquis 5 mg 2  times a day.  8.  Ferrous sulfate 325 mg oral 2 times a day.  9.  Fish oil 1000 mg oral 2 times a day.  10.  Fludrocortisone 0.1 mg oral once a day in the morning.  11.  Lasix 20 mg daily.  12.  Hydromet 1.5/5 mg 1 teaspoon every 6 hours as needed.  13.  Isosorbide mononitrate 30 mg daily.  14.  Potassium chloride 20 mEq daily.  15.  Midodrine 5 mg oral 3 times a day for low blood pressure.  16.  Mucinex 600 mg oral 2 times a day as  needed.  17.  Nitrostat 0.4 sublingual as needed for chest pain.  18.  Nystatin 100,000 units swish and swallow oral 2 times a day.  19.  Protonix 40 mg 2 times a day.  20.  Robitussin-DM 10 mL every 4 hours as needed for cough.  21.  Senna 8.6 mg 1 tablet daily.  22.  Sertraline 50 mg daily.  23.  Spiriva 18 mcg inhaled once a day.  24.  Symbicort 160/4.5 two puffs inhaled 2 times a day.  25.  Tramadol 50 mg oral 2 times a day.  26.  Ventolin HFA 2 puffs inhaled every 4 hours as needed.  27.  Vitamin C 1000 mg oral once a day.  28.  Zantac 150 mg oral once a day as needed for heartburn.   PHYSICAL EXAMINATION: VITAL SIGNS: Temperature 97.4, pulse of 78, irregular; respirations 28, blood pressure 157/85, saturating 94% on 2 liters oxygen.  GENERAL: Obese, Caucasian male patient sitting up in bed in respiratory distress with conversational dyspnea.  PSYCHIATRIC: Alert and oriented x 3. Mood and affect appropriate. Judgment intact.  HEENT: Atraumatic, normocephalic. Oral mucosa moist and pink. External ears and nose normal. No pallor. No icterus. Pupils bilaterally equal and react to light.  NECK: Supple. No thyromegaly or palpable lymph nodes. Trachea midline. No carotid bruit or JVD.  CARDIOVASCULAR: S1 and S2, irregular with murmurs. Peripheral pulses 2+.  RESPIRATORY: Has bilateral crackles. Decreased air entry.  GASTROINTESTINAL: Soft abdomen, nontender. Bowel sounds present. No organomegaly palpable.  GENITOURINARY: No CVA tenderness or bladder distention.  SKIN: Warm and dry.   No petechiae, rash or ulcers. MUSCULOSKELETAL: No joint swelling or redness in large joints. Normal muscle tone.  NEUROLOGICAL: Motor strength 5 out of 5 in all 4 extremities. Sensation is intact all over.  LYMPHATIC: No cervical lymphadenopathy.   LABORATORY, DIAGNOSTIC AND RADIOLOGICAL DATA:  Show:  1.  Glucose of 85, BNP of 6254, BUN 19, creatinine 1.26, sodium 138, potassium 3.8. Troponin less than 0.02.   2.  WBC 8.3, hemoglobin 11.9 with platelets of 162.  3.  EKG shows atrial fibrillation but no acute ST-T wave changes.  4.  Chest x-ray shows overt congestive heart failure, mild bilateral pleural effusions.   ASSESSMENT AND PLAN: 1.  Acute on chronic diastolic congestive heart failure. Will start the patient on IV Lasix b.i.d., also supplement potassium with this Lasix dosing. It is unclear why the patient has had decompensation of his congestive heart failure. As per my conversation, he has not had any extra fluid, no increased salt intake, He does complain of chest pressure which could be acute coronary syndrome which could have causes his decompensation.  Will check 2 more sets of cardiac enzymes. Consult cardiology secondary to the ongoing chest pressure. This seems to be pleuritic at this point.  We will get CT scan of the chest to rule out pulmonary  embolism first.  2.  Chronic atrial fibrillation, presently rate controlled. Continue medications. Continue Eliquis.  3.  Recurrent syncope. The patient has had holter monitors placed in the past. He is supposed to follow up with EP today but presently he is in the hospital. Will await cardiology input.  4.  Chronic obstructive pulmonary disease, chronic respiratory failure, stable. No wheezing.  5.  Acute on chronic respiratory failure secondary to congestive heart failure. Continue oxygen to keep sats over 90%.  6.  Hypertension. Continue medications.  7.  Deep venous thrombosis prophylaxis. The patient is on Eliquis.  8.  CODE STATUS: Full code.   TIME SPENT TODAY ON THIS CASE: 40 minutes.    ____________________________ Leia Alf Chirstina Haan, MD srs:cs D: 02/22/2014 15:08:03 ET T: 02/22/2014 15:52:47 ET JOB#: [id number]  cc: Alveta Heimlich R. Darvin Neighbours, MD, <Dictator> Minna Merritts, MD Neita Carp MD ELECTRONICALLY SIGNED 03/01/2014 18:16

## 2014-12-10 NOTE — Op Note (Signed)
PATIENT NAME:  Javier [name], Javier [name] MR#:  [id number] DATE OF BIRTH:  Jun 25, 1936  DATE OF PROCEDURE:  10/13/2013  PREOPERATIVE DIAGNOSES: 1.  Abdominal aortic aneurysm.  2.  Gastritis.  3.  Hypertension.  4.  Transient ischemic attack.  5.  Coronary artery disease.  6.  Atrial fibrillation.   POSTOPERATIVE DIAGNOSES: 1.  Abdominal aortic aneurysm.  2.  Gastritis.  3.  Hypertension.  4.  Transient ischemic attack.  5.  Coronary artery disease.  6.  Atrial fibrillation.   PROCEDURES PERFORMED: 1.  Introduction catheter into aorta, right femoral artery approach.  2.  Introduction catheter into aorta, left femoral artery approach.  3.  Abdominal aortogram.  4.  Repair abdominal aortic aneurysm with the Gore C3 Excluder device.  5.  Extension of the right limb using a 12 x 7 extender cuff.   SURGEONS:  Dr. Lucky Cowboy, Dr. Delana Meyer.  ANESTHESIA: General by endotracheal intubation.   FLUIDS: Per anesthesia record.   ESTIMATED BLOOD LOSS: 100 mL.   SPECIMEN: None.   FLUOROSCOPY TIME:  23.6 minutes.   CONTRAST USED:  Isovue 65 mL.   INDICATIONS FOR PROCEDURE:  Javier [name] is a 79 year old gentleman with abdominal aortic aneurysm measuring approximately 5.5 cm. The risks and benefits for endovascular repair were reviewed. Alternative therapies were also reviewed. All questions have been answered. The patient agrees to proceed.   DESCRIPTION OF PROCEDURE: The patient is taken to the special procedure suite, placed in the supine position. After adequate intubation and general anesthesia was achieved, he is positioned supine and prepped from the nipple line down to the knees. He is then draped in a sterile fashion and appropriate timeout is called.   Working simultaneously with myself on the right and Dr. Lucky Cowboy on the left, ultrasound is placed in a sterile sleeve and the common femoral artery is identified. It is echolucent and pulsatile. Access on the right is obtained with a micropuncture needle  under direct ultrasound visualization and on the left side it is obtained with a Seldinger needle. Microwire followed by micro sheath, J-wire followed by a 6-French sheath is inserted. A J-wire followed by a 6-French sheath is inserted on the left. Performing the Pre-Close technique, 2 Perclose devices are then utilized at the 11 o'clock and 1 o'clock positions on the right and left side. The pigtail catheter is then advanced up the right side and an AP projection of the aorta is obtained. After review of the images, 7000 units of heparin is given. A 26 x 14 x 14 main body device is selected and prepped on the back table. A Lunderquist Stiff Wire is then advanced through the pigtail catheter and a KMP catheter is advanced up the left side. Magnified imaging is then obtained at the level of the renals and the main body is positioned. The main body is then deployed. After several maneuvers, Dr. Lucky Cowboy working from the left side engaged the contralateral gate. Pigtail catheter was twirled in the main body and then advanced through. The Super Stiff wire is then advanced from the left side. It should be noted that once the Stiff wires had been placed on the right, a 16-French DrySeal sheath was advanced and on the left a 14-French. A 23 x 14 contralateral limb was then prepped on the back table after oblique view was obtained and measurements made to the bifurcation of the iliacs and subsequently is advanced through the sheath and then deployed without difficulty.  Coda balloon is then advanced up  the left side and used to seal the proximal as well as the overlap of the contralateral leg and the distal seal zone. On the right side, an oblique magnified view is obtained and a 12 x 7 extender cuff is selected. This is then deployed and then the [place] balloon is advanced up the right side and, again, the system is angioplastied with the Coda balloon throughout its course.   The pigtail catheter is then reintroduced up the  right and AP projection of the aorta is obtained. There is rapid flow of contrast through the device with good filling of the external and internal iliac arteries. There is no evidence of endoleak at the time of implantation.   Both right and left groin are then closed using the Pre-Close device with excellent result; 4-0 Monocryl is placed in the skin puncture site and Dermabond is applied.   The patient tolerated the procedure well, and there were no immediate complications.   INTERPRETATION: Initial views of the aorta demonstrate abdominal aortic aneurysm. Renal arteries are localized and subsequently the main body is placed without difficulty. Once engaged, the contralateral limb is placed and then subsequently the right side is extended as described above. At the completion, there are no endoleaks visualized. There is rapid flow of contrast through both right and left limbs.   SUMMARY: Successful exclusion of abdominal aortic aneurysm using a Gore C3 Excluder endograft as described above.    ____________________________ Katha Cabal, MD ggs:ce D: 10/13/2013 16:40:15 ET T: 10/13/2013 17:54:50 ET JOB#: [id number]  cc: Katha Cabal, MD, <Dictator> Minna Merritts, MD Lucilla Lame, MD Herbon E. Raul Del, MD  Katha Cabal MD ELECTRONICALLY SIGNED 10/20/2013 11:03

## 2014-12-10 NOTE — Discharge Summary (Signed)
PATIENT NAME:  Javier [name], Javier [name] MR#:  [id number] DATE OF BIRTH:  September 25, 1935  DATE OF ADMISSION:  11/01/2013 DATE OF DISCHARGE:  11/02/2013  DISCHARGE DIAGNOSES:  1. Acute on chronic diastolic congestive heart failure.  2. Chronic obstructive pulmonary disease.  3. Chronic respiratory failure.  4. Chronic atrial fibrillation.  5. History of transient ischemic attack.  6. History of gastrointestinal bleeds.  7. Obesity.  8. Chronic atrial fibrillation.   CONSULTANTS:  1. Drs. Thornell Sartorius with cardiology.  2. Dr. Raul Del with pulmonary.   IMAGING STUDIES DONE: Include a CT scan of the chest for pulmonary embolism with contrast showed congestive heart failure, ground-glass opacities, interstitial lung disease. No pulmonary embolism.   Chest x-ray, PA and lateral, showed pulmonary edema.   ADMITTING HISTORY AND PHYSICAL: Please see detailed H and P dictated by Dr. Lavetta Nielsen previously. In brief, a 79 year old male patient with history of congestive heart failure, recent AAA repair, COPD on 2 liters home oxygen. Presented to the hospital complaining of worsening shortness of breath. The patient was found to be in acute on chronic diastolic CHF. Admitted to the hospitalist service.   HOSPITAL COURSE:  1. Acute on chronic diastolic CHF: The patient was diuresed with IV Lasix with which he diuresed well. His creatinine presently is slowly creeping up and is at 1.3, and he has been tapered back to oral Lasix. The patient stopped taking Lasix as he lost a little weight, although he was fluid overloaded which caused his admission. He was counseled to be compliant with medications. I had a long discussion with Dr. Rockey Situ during the day of discharge. The patient was thought to be a candidate to go to [facility], but considering the patient has returned to baseline, has good followup with Dr. Rockey Situ, has ambulated without any difficulty, has oxygen at home, the patient has preferred going home instead and will  be discharged to go home to follow up with his cardiologist, Dr. Rockey Situ.  2. COPD: The patient has had worsening COPD over the past few months. Will continue all of his inhalers. Follow up with Dr. Raul Del.  3. Recent AAA repair: Stable. No problems.  4. Prior to discharge, the patient did not have any wheezing. No crackles on lung examination. No edema in the extremities. Abdomen was nontender, soft. Bowel sounds present. The patient has ambulated well without any assistance. He is alert and oriented x 3.   DISCHARGE MEDICATIONS:  1. Symbicort 160/4.5 two puffs inhaled 2 times a day.  2. Ferrous sulfate 325 mg 2 times a day.  3. Eligard 45 mg subcutaneous every 6 months.  4. Singulair 10 mg daily.  5. Ventolin HFA 2 puffs inhaled every 4 hours as needed.  6. Nitrostat 0.4 sublingual as needed for chest pain.  7. Florinef 0.1 mg oral once a day.  8. Zoloft 50 mg daily.  9. Crestor 20 mg daily.  10. Fish oil 1000 mg twice a day.  11. Docusate sodium 100 mg oral 2 times a day.  12. Aspirin 81 mg daily.  13. Vitamin C 1000 mg oral daily.  14. Spiriva 18 mcg inhaled daily.  15. Senna 8.6 mg oral daily.  16. Mucinex 600 mg oral 2 times a day.  17. Alprazolam 0.25 mg every 6 hours as needed for anxiety.  18. Codeine/guaifenesin 10 mL every 4 hours as needed for cough.  19. Acetaminophen/hydrocodone 325/5 one tablet every 4 hours as needed for severe pain.  20. Apixaban/Eliquis 5 mg oral 2  times a day.  21. Prilosec 20 mg 2 tablets once a day.  22. Lasix 40 mg oral once a day.  23. Potassium chloride 20 mEq oral once a day.  24. DuoNeb 3 mL inhaled every 4 hours as needed for shortness of breath.  25. Bisoprolol 5 mg oral 2 times a day.   DISCHARGE INSTRUCTIONS: The patient has been set up with home health with nursing. Continue oxygen 2 liters per minute. Low-sodium diet. Daily fluids less than 2 liters a day. The patient to follow up with Dr. Raul Del and Dr. Rockey Situ in 1 to 2 weeks. He is  to take an extra dose of Lasix if he gains more than 3 pounds.    DISCHARGE TIME SPENT ON DAY OF DISCHARGE: 48 minutes.   ____________________________ Leia Alf Taygan Connell, MD srs:gb D: 11/03/2013 15:37:14 ET T: 11/03/2013 22:53:23 ET JOB#: [id number]  cc: Alveta Heimlich R. Raneshia Derick, MD, <Dictator> Minna Merritts, MD Herbon E. Raul Del, MD Francee Nodal, MD  Neita Carp MD ELECTRONICALLY SIGNED 11/13/2013 10:25

## 2014-12-10 NOTE — Consult Note (Signed)
Primary Cardiologist: Dr. Rockey Situ yo with history of CAD s/p CABG, diastolic CHF, chronic atrial fibrillation, COPD on home oxygen, and orthostatic hypotension presented with acute on chronic diastolic CHF.  Patient has been more short of breath than baseline for about a week.  He has not taken any Lasix because his weight has not been increasing.  He was on oxygen at night but was started on oxygen during the day by pulmonary this past week.  No chest pain.  Initially, he was short of breath walking around his house.  By yesterday, he was short of breath at rest.  +orthopnea.  He came to the ER.  CTA chest was negative for PE but showed pulmonary edema.  BNP was elevated.  Of note, he is on fludrocortisone.  He denies orthostatic-type lightheadedness. He has been started on IV Lasix and is doing better symptomatically.  is in atrial fibrillation chronically.  He had CABG in 2007, last cath in 11/14 showed stable anatomy.  EF 50-55% on recent echo.  CAD: s/p CABG in 2007.  H/o Plavix intolerance.  11/14 stress Cardiolite with lateral ischemia.  LHC (11/14) with LIMA-LAD and SVG-OM3 patent, patent LM stent, occluded SVG-D (chronic). Diastolic CHF: Echo (2/35) with EF 50-55%, mild LVH, normal RV size with low normal systolic function, mild to moderate MR and TR. Orthostatic hypotension on fludrocortisoneChronic atrial fibrillation on Eliquis. AAA s/p endovascular repair in 2/15. COPD: On 2L home oxygen. h/o AECOPD. HTNCEA bilaterally in 2002. h/o TIAs Prior smoker. No ETOH.  Lives in [place]. No CAD. All systems reviewed and negative except as per HPI.  ECG: atrial fibrillation with inferior QsCTA chest: No PE, there was evidence for pulmonary edema.  K 3.3, creatinine 1.09, Mg 1.6, TnI negative x 4, HCT 34.5, BNP 5735 40 mg IV daily0.1 daily50 mg bid5 mg bid81 daily20 daily Afebrile, HR 90s atrial fibrillation, 146/74, 98% 2L NCNADJVP 12-14 cm, no thyromegalyCrackles at bases, end expiratory wheezesHeart  irregular S1S2, no gallop, 1/6 HSM apex, 1+ ankle edema, no carotid bruit. Soft, NT, +BS, no HSMNo clubbing/cyanosisNo rashA&O x 3  79 yo with history of CAD s/p CABG, diastolic CHF, chronic atrial fibrillation, COPD on home oxygen, and orthostatic hypotension presented with acute on chronic diastolic CHF.  1. Acute on chronic diastolic CHF: Patient has not been on Lasix at home because his weight has not been increasing.  He also has not been eating as much as in the past.  He is clearly volume overloaded on exam with wet CTA chest.  Increase Lasix to 40 mg IV bid and follow I/Os closely.  He will likely need a couple more days IV diuresis. Replete K and follow creatinine. He will need to be on standing po Lasix at home. I think he is going to need to stop fludrocortisone as this leads to sodium retention. Atrial fibrillation: Chronic, rate control is reasonable.  I am going to try him on bisoprolol 5 mg bid (beta-1 selective) rather than metoprolol given severe COPD and wheezing. He is on apixaban.CAD: s/p CABG.  No chest pain, troponin negative.  I do not think that this represents ACS.  He is on ASA 81 and statin.  Could consider stopping ASA 81 given apixaban use, will leave this up to Dr Rockey Situ. COPD: Patient is wheezing on exam.  He is getting nebulizers.  Suspect his symptomatology is more due to CHF.  As above, change to more beta-1 selective beta blocker.Recent endovascular AAA repair: No complications.  Aundra Dubin  Electronic Signatures: Larey Dresser (MD)  (Signed on 15-Mar-15 11:39)  Authored  Last Updated: 15-Mar-15 11:39 by Larey Dresser (MD)

## 2014-12-10 NOTE — H&P (Signed)
PATIENT NAME:  Javier [name], Javier [name] MR#:  [id number] DATE OF BIRTH:  02-28-1936  DATE OF ADMISSION:  05/07/2014  PRIMARY CARE PHYSICIAN:  Francee Nodal, MD  CARDIOLOGIST:  Minna Merritts, MD  REQUESTING PHYSICIAN:  Latina Craver, MD  CHIEF COMPLAINT:  Chest pain.   HISTORY OF PRESENT ILLNESS:  The patient is a 79 year old male with a known history of COPD, coronary artery disease, atrial fibrillation on Eliquis, being admitted for chest pain. The patient has been having generalized arthralgia for several weeks for which he was started on prednisone taper 10 mg taper every week. He has finished about 2 weeks' worth of course: Now he is on 10 mg tablet and started feeling worse for the last couple of days. This evening around 8:30 he started having chest pain associated with some shortness of breath. The pain was about 9/10. He tried 3 nitroglycerin sublingually and 4 chewable aspirin, which did relieve his pain, so he got concerned considering his underlying coronary artery disease and called EMS who brought him down to the Emergency Department. He reports his pain radiating to the left arm and shoulder area along with back also. In the ED, he was given morphine, which helped his pain somewhat and now his pain is about 5/10. Pain is still not completely gone. He is being admitted for further evaluation and management.   PAST MEDICAL HISTORY:  1.  Diastolic heart failure.  2.  COPD, on 2 liters oxygen.  3.  Chronic atrial fibrillation, now on Eliquis.  4.  Hypertension.  5.  Prostrate cancer.  6.  History of coronary artery disease, status post CABG and multiple stents. Last catheterization back in November 2014.  7.  Carotid endarterectomy.  8.  TIA.  9.  Peripheral vascular disease.  10.  Obesity.  11.  Obstructive sleep apnea.   PAST SURGICAL HISTORY:  1.  CABG.  2.  Carotid endarterectomy.  3.  Cardiac stents.  4.  Prostate surgery.  5.  LASIK eye surgery.   SOCIAL HISTORY:   Former smoker, quit about 3-4 years ago. No alcohol or other drug use. He is retired from a Administrator, [employer]. He is married, lives with his wife and his daughter, along with his son-in-law.   FAMILY HISTORY:  Father died from complication of throat and esophageal cancer. Mother died at age of 53 from a heart attack.   ALLERGIES:  PREDNISONE, ALTHOUGH HE IS ALREADY ON PREDNISONE HERE.   MEDICATIONS AT HOME:  1.  Aspirin 81 mg p.o. daily.  2.  Protonix 40 mg p.o. every morning and 1 tablet in the evening.  3.  Fludrocortisone 0.1 mg p.o. every morning.  4.  Senna once daily.  5.  Zoloft 100 mg p.o. daily.  6.  Lasix 20 mg p.o. daily.  7.  Potassium chloride 20 mEq p.o. daily with Lasix only.  8.  Midodrine 5 mg 3 times a day as needed for blood pressure under 100.  9.  Iron 65 mg p.o. twice a day.  10.  Bisoprolol 5 mg p.o. b.i.d.  11.  Vitamin C 1000 mg p.o. daily.  12.  Crestor 20 mg p.o. daily.  13.  Imdur 30 mg p.o. daily.  14.  Eliquis 5 mg p.o. b.i.d.  15.  Fish oil twice a day.  16.  Stool softener twice a day.  17.  Spiriva once daily.  18.  Symbicort 2 puffs inhaled twice a day.  19.  Ventolin  HFA as needed.  20.  Oxygen 2 liters by nasal cannula.  21.  Alprazolam 0.5 mg p.o. daily as needed.  22.  Elegard 30 mg every 6 months.  23.  Nitrostat 0.4 mg sublingual x 3 as needed.  24.  Nystatin swish and swallow 5 mL twice a day.  25.  Hydromet 5 mL oral syrup 1 teaspoon every 6 hours as needed.  26.  Robitussin 10 mg every 4 hours as needed.   REVIEW OF SYSTEMS:  CONSTITUTIONAL:  No fever, fatigue, weakness.  EYES:  No blurred or double vision. Positive for glaucoma.  ENT:  No tinnitus or ear pain.  RESPIRATORY:  Positive for dry cough. No wheezing. No hemoptysis.  CARDIOVASCULAR:  Positive for chest pain. No orthopnea. No edema.  GASTROINTESTINAL:  No nausea, vomiting, diarrhea.  GENITOURINARY:  No dysuria or hematuria.  ENDOCRINE:  No  polyuria or nocturia.  HEMATOLOGY:  No anemia or easy bruising.  SKIN:  No rash or lesions.  MUSCULOSKELETAL:  Arthralgia present.  NEUROLOGIC:  History of syncope and TIA. No current symptoms of tingling or numbness.  PSYCHIATRY:  Positive anxiety. No history of depression.   PHYSICAL EXAMINATION:  VITAL SIGNS:  Temperature 98.1, heart rate 93 per minute, respirations 20 per minute, blood pressure 118-83 mmHg. He is saturating 96% on room air.  GENERAL:  The patient is a 79 year old male lying in the bed comfortably without any acute distress.  EYES:  Pupils equal, round, reactive to light and accommodation. No scleral icterus. Extraocular muscles intact.  HEENT:  Head atraumatic, normocephalic. Oropharynx and nasopharynx clear.  NECK:  Supple. No jugular venous distention. No thyroid enlargement or tenderness.  LUNGS:  Clear to auscultation bilaterally. No wheezing, rales, rhonchi, or crepitation.  CARDIOVASCULAR:  S1, S2 normal. No murmur, rubs, or gallop.  ABDOMEN:  Soft, nontender, nondistended. Bowel sounds present. No organomegaly or mass.  EXTREMITIES:  No pedal edema, cyanosis, or clubbing.  NEUROLOGIC:  Cranial nerves II through XII intact. Muscle strength 5/5 in all extremities. Sensation intact.  PSYCHIATRIC:  The patient is alert and oriented x 3.  SKIN:  No obvious rash, lesions, or ulcer.  MUSCULOSKELETAL:  No joint effusion or tenderness.   LABORATORY DATA:  Normal BMP except potassium of 3.2. BNP of 2799. Troponin less than 0.02. CBC showed white count 13.1, hemoglobin 10.3, hematocrit 31.6, platelets 165,000. PT of 16.0, INR 1.3.   IMAGING:  Chest x-ray in the ED showed mild pulmonary vascular congestion and cardiomegaly. Small left pleural effusion.   EKG showed sinus rhythm. No major ST-T changes.   IMPRESSION AND PLAN:  1.  Chest pain, unsure whether this is truly cardiac or not, but considering his extensive cardiac and coronary artery disease, we will monitor him  on telemetry, obtain serial troponins, start him on aspirin, nitroglycerin, and morphine for pain control.  2.  Generalized arthralgia:  He is on a steroid taper. Unsure if he has any underlying rheumatological disorder. We will order an ANA panel. Consider rheumatology consult either in the hospital or outpatient, therefore, depending on the laboratory results and his signs and symptoms.  3.  Chronic obstructive pulmonary disease, seems stable at this time.  4.  Hypokalemia:  We will replete and recheck. Check magnesium.  5.  Atrial fibrillation:  We will continue Eliquis. His rate seems well controlled at this time.  6.  Code status:  Full code.   Total time taking care of this patient was 50 minutes.  ____________________________ Lucina Mellow. Manuella Ghazi, MD vss:ts D: 05/08/2014 01:04:15 ET T: 05/08/2014 01:29:49 ET JOB#: [id number]  cc: Milt Coye S. Manuella Ghazi, MD, <Dictator> Francee Nodal, MD Minna Merritts, MD Lucina Mellow [facility] MD ELECTRONICALLY SIGNED 05/11/2014 16:34

## 2014-12-11 NOTE — H&P (Signed)
PATIENT NAME:  Javier [name], Javier [name] MR#:  [id number] DATE OF BIRTH:  May 23, 1936  DATE OF ADMISSION:  08/20/2011  REFERRING PHYSICIAN: ER physician, Dr. Jasmine December PRIMARY CARE PHYSICIAN: Dr. Mila Palmer  CARDIOLOGIST: Dr. Rockey Situ    CHIEF COMPLAINT: Shortness of breath and chest pain.   HISTORY OF PRESENT ILLNESS: Patient is a 79 year old male with past medical history of chronic diastolic congestive heart failure, hypertension, chronic angina, coronary artery disease status post coronary artery bypass graft, peripheral vascular disease, hypertension, hyperlipidemia, chronic obstructive pulmonary disease who was recently admitted to [facility] from 08/12/2011 to 08/18/2011 for treatment of chronic obstructive pulmonary disease exacerbation and diastolic congestive heart failure. Patient was discharged home in a stable condition. He says that he felt well for a couple of days. This afternoon around lunchtime he went to have a shower and subsequently developed shortness of breath and chest pains he described as pressure. Patient has a nebulizer machine at home and used one dose of nebulizer but did not feel well therefore he came to the Emergency Room. Currently he is resting comfortably and is not having any wheezing. An outpatient stress test was being planned for the patient.   PAST MEDICAL HISTORY: 1. Coronary artery disease status post coronary artery bypass graft followed by four stents about two years ago. 2. Hypertension. 3. Chronic angina. 4. Peripheral vascular disease. 5. Prostate cancer status post brachytherapy.  6. Hyperlipidemia. 7. Chronic obstructive pulmonary disease.   PAST SURGICAL HISTORY:  1. Coronary artery disease status post coronary artery bypass graft followed by four stent placement. 2. Intraocular lens implant after cataract surgery.  3. Carotid endarterectomy.   ALLERGIES: Prednisone/Medrol Dosepak which caused monilial esophagitis.   CURRENT MEDICATIONS:  1. Albuterol 2  puffs q.i.d. p.r.n.  2. Aspirin 162 mg daily.  3. Lipitor 40 mg daily.  4. Combivent 1 puff p.r.n.  5. Eligard 45 mg subcutaneous every six months. 6. Xopenex/ipratropium nebulizer every six hours p.r.n.  7. Imdur 30 mg daily.  8. Lasix 40 mg daily.  9. Levaquin 750 mg daily. 10. Mega Red 300 mg b.i.d.  11. Lopressor 100 mg daily.  12. Mucinex 600 mg b.i.d.  13. Multivitamin 1 tablet daily.  14. Nitroglycerin p.r.n.  15. Os-Cal 500 with vitamin D b.i.d.  16. Protonix 20 mg daily. 17. Spiriva 18 mcg inhaled daily. 18. Symbicort 160/4.5, 2 puffs b.i.d.  19. Zetia 10 mg daily.   SOCIAL HISTORY: Patient lives with his wife, Tamela Oddi. He quit smoking about two years ago; used to smoke half pack per day.   FAMILY HISTORY: Mother had diabetes and heart disease. Father had esophageal and stomach cancer. Father had heart disease.   REVIEW OF SYSTEMS: CONSTITUTIONAL: Patient denies any fever, unusual weight changes. Reports some weakness and fatigue. EYES: Denies any blurred or double vision. ENT: Denies any tinnitus, ear pain. RESPIRATORY: Reports cough, painful respiration on the right side and some wheezing at home. CARDIOVASCULAR: Denies any tachycardia, palpitations. Reports pressure-like chest pain on the left side. GASTROINTESTINAL: Denies any nausea, vomiting, diarrhea, abdominal pain. GENITOURINARY: Denies any dysuria, hematuria, nocturia. MUSCULOSKELETAL: Denies any joint pains, swelling, gout. INTEGUMENT: Denies any acne, rashes. NEUROLOGICAL: Denies any fainting spells, blackouts, seizures. PSYCH: Denies any insomnia, headache, depression. ENDOCRINE: Denies any thyroid problems, heat or cold intolerance. HEME/LYMPH: Denies any anemia, easy bruisability.   PHYSICAL EXAMINATION:  VITAL SIGNS: Temperature 98, heart rate 69, respiratory rate 20, blood pressure 126/76, pulse ox 96% on room air.   GENERAL: Patient is an overweight Caucasian male  sitting comfortably in bed, not in acute  distress.   HEAD: Atraumatic, normocephalic.   EYES: Mild pallor. No icterus or cyanosis. Pupils are equal, round, and reactive to light and accommodation. Extraocular movements are intact.   ENT: Wet mucous membranes. No oropharyngeal erythema or thrush.   NECK: Supple. No masses. No JVD. No thyromegaly. No lymphadenopathy.   CHEST WALL: No tenderness to palpation. Not using accessory muscles of respiration. No intercostal muscle retractions.   LUNGS: Bilaterally clear to auscultation. No wheezing, rales or rhonchi.   CARDIOVASCULAR: S1, S2 regular. There is a systolic murmur. No rubs or gallops.   ABDOMEN: Soft, nontender, nondistended. No guarding. No rigidity. No organomegaly. Normal bowel sounds.   SKIN: No rashes or lesions.   PERIPHERIES: Trace pedal edema, 1+ pedal pulses.   MUSCULOSKELETAL: No cyanosis or clubbing.   NEUROLOGIC: Awake, alert, oriented x3. Nonfocal neurological exam. Cranial nerves grossly intact.   PSYCH: Normal mood and affect.   LABORATORY, DIAGNOSTIC, AND RADIOLOGICAL DATA: Chest x-ray shows mild interstitial pulmonary edema. Troponin 0.04, white count 25.2, hemoglobin 13.3, hematocrit 39.4, platelets 170, glucose 122, BUN 43, creatinine 1.29, sodium 141, potassium 4.3, chloride 104, CO2 26, calcium 8.4. Normal LFTs. BNP 1960.   ASSESSMENT AND PLAN: 79 year old male with history of diastolic congestive heart failure, hypertension, coronary artery disease status post coronary artery bypass graft, chronic angina, peripheral vascular disease recently discharged from hospital presents with shortness of breath and chest pain.  1. Chest pain. Patient's EKG is nondiagnostic and shows no acute ischemic changes. Patient has a history of chronic angina. An outpatient stress test was planned for the patient. Will admit the patient to the hospital. Check serial cardiac enzymes and plan for an inpatient stress test. Will continue the patient's aspirin, beta blocker,  Imdur.  2. History of chronic obstructive pulmonary disease with presentation of shortness of breath. Currently patient's chest exam is normal. He has no evidence of wheezing, rales, or rhonchi. He was recently treated with antibiotics and steroids. There is no evidence at present of any chronic obstructive pulmonary disease exacerbation. Will continue patient's current inhalers.  3. History of coronary artery disease status post coronary artery bypass graft. Will continue aspirin, beta blocker, Imdur.  4. History of hypertension. Patient's blood pressure is currently well controlled. Will continue patient's current medications. 5. Hyperlipidemia. Will continue patient's statin therapy.   6. History of chronic diastolic congestive heart failure. Currently patient shows no evidence of exacerbation. Will continue low dose Lasix. Last echo was done on 08/12/2011 which showed an ejection fraction of 60% to 65%. Mild left ventricular hypertrophy with mild diastolic dysfunction. 7. Chronic kidney disease. During patient's last admission patient had chronic kidney disease stage III. Currently his creatinine is much improved.  8. Leukocytosis with a white count of 28.2. Patient is afebrile. He is on antibiotic therapy. Patient was treated with steroids during his last hospitalization. Patient's white count is likely elevated as a result of that.   9. Hyperglycemia. This is likely reactive. Patient's hemoglobin A1c during last admission was 6.3.  10. Will place on GI and deep vein thrombosis prophylaxis.   Reviewed old medical records, discussed with ER physician.   TIME SPENT: 75 minutes.    ____________________________ Cherre Huger, MD sp:cms D: 08/20/2011 18:12:34 ET T: 08/21/2011 07:46:12 ET JOB#: [id number]  cc: Cherre Huger, MD, <Dictator> Francee Nodal, MD Cherre Huger MD ELECTRONICALLY SIGNED 08/21/2011 9:25

## 2014-12-11 NOTE — Consult Note (Signed)
Brief Consult Note: Diagnosis: Left index finger proximal phalanx fracture.   Patient was seen by consultant.   Consult note dictated.   Comments: Splint applied by ED.  No surgery needed at this time.  Patient will follow up with Dr. Rudene Christians once discharged.  Electronic Signatures: Ria Comment (MD)  (Signed [phone] 20:20)  Authored: Brief Consult Note   Last Updated: 26-May-13 20:20 by Ria Comment (MD)

## 2014-12-11 NOTE — Consult Note (Signed)
Brief Consult Note: Diagnosis: Cough syncope, atypical chest pain likely due to cough and COPD.   Patient was seen by consultant.   Consult note dictated.   Comments: No evidence of arrhythmia, Echo showed normal LVSF.  Stress test in 08/2011 showed no major ischemia.  No furthur cardiac testing is recommended at this time.  Electronic Signatures: Kathlyn Sacramento (MD)  (Signed 27-May-13 15:59)  Authored: Brief Consult Note   Last Updated: 27-May-13 15:59 by Kathlyn Sacramento (MD)

## 2014-12-11 NOTE — Discharge Summary (Signed)
PATIENT NAME:  Javier [name], Javier [name] MR#:  [id number] DATE OF BIRTH:  October 28, 1935  DATE OF ADMISSION:  01/12/2012 DATE OF DISCHARGE:  01/17/2012  PRIMARY CARE PHYSICIAN: Miscellaneous  DISCHARGE DIAGNOSES:  1. Syncope possibly due to vasovagal reaction.  2. Chronic obstructive pulmonary disease exacerbation.  3. Dehydration.  4. History of coronary artery disease. 5. Hypertension.  6. Peripheral vascular disease.  7. Hyperlipidemia.   HOME MEDICATIONS: Please see [facility] discharge instructions.   ADDITIONAL MEDICATIONS:  1. Levaquin 500 mg p.o. daily for five days. 2. Norvasc 10 mg p.o. daily.  3. Clonidine 0.2 mg p.o. b.i.d.   DIET: Low sodium diet.   ACTIVITY: As tolerated.  FOLLOW UP CARE: Follow up with primary care physician within one week. Follow up with cardiology, Dr. Rockey Situ, within 1 to 2 weeks.    REASON FOR ADMISSION: Unstable angina with presumed cardiogenic syncope.   CONSULTATIONS:  1. Cardiology, Dr. Fletcher Anon.  2. Pulmonary, Dr. Raul Del.   HISTORY OF PRESENT ILLNESS: Patient is a 79 year old Caucasian male with history of coronary artery disease status post CABG, prostate cancer status post brachytherapy, peripheral vascular disease, hypertension, chronic obstructive pulmonary disease, hyperlipidemia presented to ED with stuttering chest pain occurring at rest associated with near syncope. Patient had acute syncopal episode today before this admission. He presented to the ED with increasing episodes of chest discomfort not relieved by nitroglycerin. His troponin level was negative. He was admitted for further evaluation. For a detailed history and physical examination, please refer to the admission note dictated by Dr. Doy Hutching on admission day.   LABORATORY, DIAGNOSTIC AND RADIOLOGICAL DATA: Chest x-ray showed no acute infiltrate or edema. Laboratory data shows BUN 32, creatinine 1.06, sodium 141, potassium 4.2, white count 15.9, hemoglobin 12.3.   HOSPITAL COURSE: He was  admitted for chest pain worrisome for unstable angina, presumed cardiogenic syncope and chronic obstructive pulmonary disease exacerbation. After admission pt has been treated with Lovenox, aspirin, nitroglycerin and morphine p.r.n. Cardiac exam was negative. CAT scan of head showed no acute intracranial abnormality. Carotid duplex no hemodynamically significant carotid arterial stenosis. Echocardiogram showed normal ejection fraction more than 55%. Dr. Fletcher Anon evaluated patient. Recommend syncope is likely due to cough and chronic obstructive pulmonary disease. No evidence of arrhythmia. No further cardiac tests recommended. In addition, Dr. Raul Del evaluated patient, agreed with current regimen including steroids, antibiotics and will arrange for outpatient allergist consult. Since patient continues to have shortness of breath, cough, wheezing, IV Solu-Medrol dose was increased yesterday. In addition, patient has been treated with IV Levaquin then changed to p.o. Today patient feels better. No cough, sputum, only has mild shortness of breath. Oxygen saturation on exertion is stable at about 96%.   PHYSICAL EXAMINATION: VITAL SIGNS: Temperature 97.9, blood pressure 120/73, pulse 83, oxygen saturation 96% on room air. GENERAL: Patient is alert, awake, oriented in no acute distress. CARDIOVASCULAR: S1, S2 regular rate, rhythm. No murmurs, gallops. PULMONARY: Bilateral crackles but only mild wheezing. ABDOMEN: Soft. No distention or tenderness. Bowel sounds present. EXTREMITIES: No edema, clubbing, or cyanosis. No calf tenderness.   CONDITION ON DISCHARGE: Patient is clinically stable. He will be discharged to home today.   Discussed the patient's discharge plan with patient, nurse and case manager.   TIME SPENT: About 45 minutes.  ____________________________ Demetrios Loll, MD qc:cms D: 01/17/2012 16:53:48 ET T: 01/18/2012 12:04:58 ET JOB#: [id number]  cc: Demetrios Loll, MD, <Dictator>  Demetrios Loll  MD ELECTRONICALLY SIGNED 01/19/2012 23:36

## 2014-12-11 NOTE — Consult Note (Signed)
PATIENT NAME:  Javier [name], HINNENKAMP MR#:  [id number] DATE OF BIRTH:  1935-12-30  DATE OF CONSULTATION:  01/12/2012  REFERRING PHYSICIAN:   CONSULTING PHYSICIAN:  Eddie Dibbles B. Jakye Mullens, MD  CHIEF COMPLAINT: Left hand pain.   HISTORY OF PRESENT ILLNESS: This is a 79 year old male who was admitted to the hospital through the Emergency Department status post fall after he syncopized. In the ED he was complaining of left hand pain. X-ray revealed evidence of a fracture of the left index finger, proximal phalanx, and Orthopedics was called.   PHYSICAL EXAMINATION: On physical exam today he is splinted already. The splint is taken down. The skin is intact. There is a small bruise over the metacarpophalangeal joint of the left index finger. There is tenderness to palpation there. The patient does not appear to have any tendon injury. His sensation is intact about all five digits. He has brisk capillary refill of all fingers. There is no instability of the joint noted.   X-ray examination reveals base of the proximal phalanx of the index finger of the left hand avulsion fracture. This is only slightly displaced. There is also noted basal joint arthritis.   ASSESSMENT: This is a 79 year old male with left index finger proximal phalanx fracture.   PLAN: No surgery is required at this point. He is to remain in a splint to the left hand. He can follow-up with Dr. Rudene Christians as an outpatient once he is discharged from the hospital for his syncope workup. He is to remain nonweightbearing of the left hand at this point. Pain medication should be given and we will see him as an outpatient.   ____________________________ Blossom Hoops. Ashby Moskal, MD pbm:drc D: 01/12/2012 20:24:07 ET T: 01/13/2012 09:05:19 ET JOB#: [id number]  cc: Eddie Dibbles B. Saarah Dewing, MD, <Dictator> Ria Comment MD ELECTRONICALLY SIGNED 02/12/2012 8:42

## 2014-12-11 NOTE — Discharge Summary (Signed)
PATIENT NAME:  Javier [name], Javier [name] MR#:  [id number] DATE OF BIRTH:  1936/04/27  DATE OF ADMISSION:  08/21/2011 DATE OF DISCHARGE:  08/21/2011  DISCHARGE DIAGNOSES:  1. Atypical chest pain, likely noncardiac, possibly musculoskeletal. 2. Chronic obstructive pulmonary disease. 3. Coronary artery disease status post coronary artery bypass graft.  4. Hypertension.  5. Hyperlipidemia.  6. Chronic diastolic congestive heart failure. 7. Chronic kidney disease.  8. Leukocytosis.  9. Hyperglycemia with hemoglobin A1c of 6.3.  10. Peripheral vascular disease.   DISPOSITION: The patient is being discharged home.   FOLLOW-UP: Follow-up with primary care physician, Dr. Mila Palmer, in 1 to 2 weeks after discharge.   DIET: Low-sodium, 1,800 calorie ADA diet.   ACTIVITY: As tolerated.   DISCHARGE MEDICATIONS:  1. Xanax 0.25 mg t.i.d. p.r.n. anxiety.  2. Tussionex 5 mL p.o. b.i.d. p.r.n. cough. 3. Lipitor 40 mg daily.  4. Aspirin 162 mg daily.  5. Imdur 30 mg daily.  6. Zetia 10 mg daily.  7. Lopressor 100 mg daily.  8. Symbicort 160/4.5, two puffs b.i.d.  9. Lasix 40 milligrams daily.  10. Mucinex 600 mg b.i.d.  11. Megarad 300 mg daily.  12. Multivitamin 1 tablet daily.  13. Os-Cal 1 tablet b.i.d.  14. Combivent one puff inhaled every six hours p.r.n.  15. Eligard 45 mg/mL injected every six months. 16. Nitroglycerin 0.4 mg sublingual p.r.n.  17. Albuterol p.r.n.  18. Spiriva 18 mcg inhaled daily.  19. Protonix 40 mg daily.  20. Xopenex/ipratropium every six hours p.r.n.  21. Levaquin 750 mg daily for seven days.   RESULTS: Inpatient nuclear stress test showed no evidence of acute ischemia. The patient has prior lateral wall infarct with mild peri-infarct ischemia and ejection fraction of 72%. Cardiac enzymes negative. Leukocytosis reactive. Improved normal hemoglobin. Normal complete metabolic panel. Normal cardiac enzymes.   HOSPITAL COURSE: The patient is a 79 year old male with  past medical history of peripheral vascular disease, coronary artery disease, chronic angina and hypertension who presented with atypical chest pain which is aggravated by coughing. The pain was felt to be noncardiac. However, the patient has a history of congestive heart failure and coronary artery disease and multiple other risk factors. Therefore, he was admitted to the hospital. He was ruled out for acute coronary syndrome by serial cardiac enzymes which were negative. 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 The rest of the patient's medical problems remained stable throughout the hospitalization. He is being discharged home in a stable condition.   TIME SPENT: 45 minutes.  ____________________________ Cherre Huger, MD sp:ap D: 08/21/2011 16:31:17 ET T: 08/22/2011 13:54:04 ET JOB#: [id number]  cc: Cherre Huger, MD, <Dictator> Francee Nodal, MD Cherre Huger MD ELECTRONICALLY SIGNED 08/22/2011 17:03

## 2014-12-11 NOTE — H&P (Signed)
PATIENT NAME:  Javier [name], Javier [name] MR#:  [id number] DATE OF BIRTH:  1936/08/15  DATE OF ADMISSION:  01/12/2012  REFERRING PHYSICIAN: Dr. Lisa Roca PRIMARY CARE PHYSICIAN: Dr. Mila Palmer  REASON FOR ADMISSION: Unstable angina with presumed cardiogenic syncope.   HISTORY OF PRESENT ILLNESS: The patient is a 79 year old male followed by Dr. Mila Palmer with a history of coronary artery disease status post CABG followed by Dr. Rockey Situ and a history of chronic obstructive pulmonary disease followed by Dr. Raul Del. The patient has been having stuttering chest pain occurring at rest associated with near syncope. Had an actual syncopal episode yesterday. Presents to the Emergency Room with increasing episodes of chest discomfort not relieved with sublingual nitroglycerin. In the Emergency Room, the patient's EKG was nonacute and his initial enzymes were negative. He is now admitted for further evaluation.   PAST MEDICAL HISTORY:  1. Atherosclerotic cardiovascular disease status post coronary artery bypass grafting.  2. Status post subsequent percutaneous transluminal coronary angioplasty with stent placement x4.  3. Prostate cancer, status post brachytherapy.  4. Peripheral vascular disease, status post bilateral carotid endarterectomies.  5. Benign hypertension.  6. Chronic obstructive pulmonary disease.  7. Hyperlipidemia.   MEDICATIONS:  1. Zetia 10 mg p.o. daily.  2. Xanax 0.25 mg p.o. every six hours p.r.n.  3. Theophylline 200 mg p.o. b.i.d.  4. Symbicort 160/4.5, 2 puffs b.i.d.  5. Spiriva 1 capsule inhaled daily.  6. Imdur 30 mg p.o. daily.  7. Lasix 20 mg p.o. daily.  8. Lipitor 40 mg p.o. daily.  9. Aspirin 81 mg p.o. daily.  10. Protonix 40 mg p.o. daily. 11. Coreg 6.25 mg p.o. b.i.d.  12. Nitrostat p.r.n. chest pain.  13. Xopenex SVN  q.6 hours p.r.n. shortness of breath.  14. Prednisone taper as directed.  15. Levaquin 500 mg p.o. daily x1 week.   ALLERGIES: No known drug  allergies.   SOCIAL HISTORY: The patient quit smoking many years ago. No history of alcohol abuse.   FAMILY HISTORY: Positive coronary artery disease, diabetes, stroke, and hypertension.    REVIEW OF SYSTEMS: CONSTITUTIONAL: No fever or change in weight. EYES: No blurred or double vision. No glaucoma. ENT: No tinnitus or hearing loss. No nasal discharge or bleeding. No difficulty swallowing. RESPIRATORY: Patient has had cough and wheezing but denies hemoptysis. No painful respiration. CARDIOVASCULAR: No orthopnea. Denies palpitations. GASTROINTESTINAL: No nausea, vomiting, or diarrhea. No abdominal pain. No change in bowel habits. GENITOURINARY: No dysuria or hematuria. No incontinence. ENDOCRINE: No polyuria or polydipsia. No heat or cold intolerance. HEMATOLOGIC: Patient denies anemia, easy bruising, or bleeding. LYMPHATIC: No swollen glands. MUSCULOSKELETAL: Patient denies pain in his neck, back, shoulders, knees, or hips. No gout. NEUROLOGIC: No numbness or migraines. Denies stroke or seizures. PSYCH: Patient denies anxiety, insomnia, or depression.   PHYSICAL EXAMINATION:  GENERAL: The patient is obese, in no acute distress.   VITAL SIGNS: Vital signs remarkable for blood pressure of 143/81 with a heart rate of 70 and a respiratory rate of 22. He is afebrile.   HEENT: Normocephalic, atraumatic. Pupils equally round and reactive to light and accommodation. Extraocular movements are intact. Sclerae are not icteric. Conjunctivae are clear. Oropharynx is clear.   NECK: Supple without jugular venous distention. Soft bilateral carotid bruits are noted. No adenopathy or thyromegaly is present.   LUNGS: Lungs reveal scattered rhonchi. No wheezes or rales. No dullness. Respiratory effort is normal.   CARDIAC: Regular rate and rhythm with normal S1, S2. No significant rubs, murmurs, or gallops.  PMI is nondisplaced. Chest wall is nontender.   ABDOMEN: Soft, nontender with normoactive bowel sounds. No  organomegaly or masses were appreciated. No hernias or bruits were noted.   EXTREMITIES: Trace edema with stasis changes. Pulses were 2+ bilaterally.   SKIN: Warm and dry without rash or lesions.   NEUROLOGIC: Cranial nerves II through XII grossly intact. Deep tendon reflexes are symmetric. Motor and sensory exam is nonfocal.   PSYCH: Exam revealed a patient who was alert and oriented to person, place, and time. He was cooperative and used good judgment.   LABORATORY, DIAGNOSTIC AND RADIOLOGICAL DATA: EKG revealed sinus rhythm with no acute ischemic changes. Chest x-ray showed no acute infiltrate or edema. Chemistries revealed a glucose of 122 with a BUN of 32 and a creatinine 1.06 with a sodium of 141 and a potassium of 4.2. Troponin was less than 0.02. CBC revealed a white count of 15.9 with a hemoglobin of 12.3.   ASSESSMENT:  1. Chest pain worrisome for unstable angina.  2. Presumed cardiogenic syncope.  3. Chronic obstructive pulmonary disease exacerbation.  4. Chronic bronchitis.  5. Hyperglycemia.  6. Peripheral vascular disease.  7. Hyperlipidemia.   PLAN: Patient will be admitted to telemetry with Lovenox, aspirin, and topical nitrates. Will continue morphine as needed for pain. Will follow serial cardiac enzymes and obtain an echocardiogram. Will also consult Dr. Rockey Situ, the patient's cardiologist, in regards to his escalating chest pain. In regards to the patient's syncope, again, will continue Lovenox. Will check carotid Doppler's. Monitor on telemetry. Will follow his sugars with Accu-Cheks before meals and at bedtime and add sliding scale insulin as needed. Will complete his antibiotic course and prednisone taper for his chronic bronchitis. Will maximize pulmonary toilet. Follow up chest x-ray in the morning. Wean oxygen as tolerated. Patient did hit his hand when he fell yesterday during this syncopal episode. Plain films reveal a possible fracture in the right hand. Will obtain  orthopedic consult for this in the morning. Further treatment and evaluation will depend upon the patient's progress.   TOTAL TIME SPENT ON THIS PATIENT: 50 minutes.   ____________________________ Leonie Douglas Doy Hutching, MD jds:cms D: 01/12/2012 19:24:00 ET T: 01/13/2012 05:59:27 ET JOB#: [id number]  cc: Leonie Douglas. Doy Hutching, MD, <Dictator> Francee Nodal, MD Alyxis Grippi Lennice Sites MD ELECTRONICALLY SIGNED 01/13/2012 21:08

## 2014-12-11 NOTE — Discharge Summary (Signed)
PATIENT NAME:  Javier [name], Javier [name] MR#:  [id number] DATE OF BIRTH:  [birth year]  DATE OF ADMISSION:  08/12/2011 DATE OF DISCHARGE:  08/18/2011  ADMITTING PHYSICIAN: Gladstone Lighter, MD  PRIMARY CARE PHYSICIAN: Francee Nodal, MD  PRIMARY CARDIOLOGIST: Ida Rogue, MD  REASON FOR ADMISSION: Difficulty breathing, cough.   DISCHARGE DIAGNOSES:  1. Acute respiratory failure secondary to chronic obstructive pulmonary disease exacerbation.  2. Mild congestive heart failure exacerbation.  3. Chronic obstructive pulmonary disease exacerbation. 4. Acute on chronic diastolic failure with ejection fraction of 60%.  5. Hypertension.  6. Subendocardial myocardial infarction versus demand ischemia.  7. Chronic kidney disease stage III. 8. History of prostate cancer.  9. Hyperlipidemia.  10. Hypomagnesemia, repleted.   CONSULTANTS:  1. Loralie Champagne, MD - [facility]. 2. Mertie Moores, MD [facility]. 3. Case Management.  4. Physical Therapy.  5. Occupational Therapy.   TESTS DURING HOSPITALIZATION: V/Q scan, 08/12/2011, showed low probability ventilation/perfusion evaluation for pulmonary arterial embolic disease.   Chest PA and lateral, 08/12/2011, showed findings consistent with chronic obstructive pulmonary disease, likely acute bronchitis, and increased interstitial densities in both lungs. Previously described are more conspicuous than on current examination.  Echo Doppler on 08/12/2011 done by Dr. Ida Rogue showed a technically difficult study with poor acoustic windows. Normal LV systolic function. Ejection fraction 60 to 65% with mild LV hypertrophy and mild diastolic dysfunction. The RV was poorly visualized but suspect normal size. Aortic sclerosis without stenosis. Portal SAM but no mitral valvular SAM noted. Mild MR.   HOSPITAL COURSE: Initial history and physical were done by Dr. Gladstone Lighter. Please refer to her note dated 08/12/2011 for complete  details. In brief this is a 79 year old white male with past medical history of coronary artery disease status post coronary artery bypass graft and four stents, hypertension, chronic angina, and chronic obstructive pulmonary disease who comes to the hospital with difficulty breathing and cough. The patient had been seen at urgent care two days prior to admission and received a steroid shot and nebulizer treatment, and he was sent home on Levaquin. He had been having difficulty breathing and used his  Spiriva and Advair with no relief. He came to the emergency room with sinus tachycardia and heart rates in the 110s. He had elevated CK and troponins. There was concern that he may have non-ST myocardial infarction.  1. Acute respiratory failure secondary to chronic obstructive pulmonary disease exacerbation along with mild congestive heart failure exacerbation. The patient was requiring oxygen and subsequently put on room air. He was treated with Levaquin, nebulizers, and inhalers.  2. Chronic obstructive pulmonary disease exacerbation. Chest x-ray was conspicuous for interstitial markings. We continued him on Solu-Medrol. The patient stated he is allergic to prednisone. He was discharged on three days of quick prednisone taper. He was continued on Levaquin. He received Spiriva, Symbicort, Tessalon Perles, and Mucinex. His symptoms improved and he was ambulating and not requiring any additional oxygen.  3. Acute on chronic diastolic failure with ejection fraction 60%, compensated. We put him on p.o. Lasix, Imdur, and metoprolol.  4. Hypertension. The patient was continued on metoprolol and Imdur.  5. Subendocardial myocardial infarction with underlying coronary artery disease versus demand ischemia. Most likely this is all from his respiratory symptoms. He was continued aspirin, statin, and metoprolol. He will need outpatient stress test once he follows up with Dr. Rockey Situ. The patient was seen by Dr. Acie Fredrickson and  Dr. Loralie Champagne. They recommended just continuing with aspirin, beta blocker,  Imdur, and referral for outpatient Myoview. 6. Chronic kidney disease CKD, stage III. Creatinine was improving to baseline.  7. History of prostate cancer. This is stable.  8. Hyperlipidemia. The patient was continued on a statin and Zetia. he patient was continued on statin and Zetia. Lipid panel was checked and he was found to have LDL of 95.  9. Hyperglycemia. A1c was checked and was 6.3.  10. Hypomagnesemia. This was repleted. 11. Deep venous thrombosis prophylaxis. He was maintained with aspirin and heparin subcutaneous.  The patient was evaluated by physical therapy and recommended for discharge home.  The patient was discharged on 08/18/2011.   DISCHARGE PHYSICAL EXAMINATION:   VITALS: Temperature 97.7, heart rate 71, respirations 16, blood pressure 122/76, and saturating 94% on room air.   LUNGS: Clear to auscultation.   HEART: Regular rate and rhythm.   ABDOMEN: Benign.   DISCHARGE MEDICATIONS:  1. Levaquin 750 mg daily x7 days.  2. Mega Red 300 mg p.o. twice a day. 3. Nitroglycerin 0.4 mg one sublingual every five minutes p.r.n.  4. Multivitamin daily.  5. Os-Cal Plus D twice a day.  6. Eligard 45 mg six month subcutaneous injection extended-release, once subcutaneous. 7. Combivent 18 mcg/103 Aerolizer inhaler as needed. 8. Atorvastatin 40 mg daily.  9. Aspirin 81 mg daily, two tablets daily. 10. Isosorbide mononitrate 30 mg 1 tablet once a day. 11. Zetia 10 mg 1/2 tablet once a day. 12. Albuterol two inhaled four times daily p.r.n. 13. Spiriva 18 mcg inhaled once a day. 14. Metoprolol tartrate 100 mg daily.  15. Protonix 20 mg daily.  16. Symbicort 160/4.5 two puffs twice a day. 17. Lasix 40 mg daily.  18. Mucinex 600 mg p.o. twice a day. 19. Atrovent 0.5 mg SVN every 6 hours p.r.n. shortness of breath. 20. Levalbuterol 1.25 mg every six hours p.r.n. shortness of breath.  HOME OXYGEN:  None.   DIET: Low sodium, low fat diet.   ACTIVITY: As tolerated.   DISCHARGE FOLLOWUP: The patient is to see Dr. Mila Palmer this week and Dr. Rockey Situ in 2 weeks.   DISCHARGE INSTRUCTIONS: The patient should weigh himself every day at the same time. If he has increase in weight, 2 pounds in one day or more than 5 pounds in one week, or increased edema, he should call his cardiologist, Dr. Rockey Situ.  CODE STATUS: FULL CODE.   Thank you for allowing me to participate in the care of this patient.   TOTAL TIME SPENT ON DISCHARGE: 50 minutes.  ____________________________ Judeth Horn Royden Purl, MD aaf:slb D: 08/19/2011 10:55:00 ET T: 08/22/2011 10:34:30 ET JOB#: [id number]  cc: Mike Craze A. Royden Purl, MD, <Dictator> Francee Nodal, MD

## 2014-12-11 NOTE — Discharge Summary (Signed)
PATIENT NAME:  Javier [name], Javier [name] MR#:  [id number] DATE OF BIRTH:  [birth year]  DATE OF ADMISSION:  11/17/2011 DATE OF DISCHARGE:  11/19/2011  For a detailed note, please see the History and Physical done by Dr. Inez Catalina on admission.   DIAGNOSES AT DISCHARGE:  1. Chronic obstructive pulmonary disease exacerbation.  2. Hypertension.  3. Hyperlipidemia.  4. History of coronary artery disease, status post coronary artery bypass graft.   DIET: The patient is being discharged on a low-sodium, low-fat diet.   ACTIVITY: As tolerated.   FOLLOWUP: Follow up with Dr. Raul Del in the next 1 to 2 weeks.    CONSULTANTS DURING THE HOSPITAL COURSE: Dr. Raul Del, pulmonary.   PERTINENT STUDIES DONE DURING THE HOSPITAL COURSE:  Chest x-ray done on admission showing CABG. No cardiomegaly, edema, or infiltrate.   HOSPITAL COURSE: This is a 80 year old male with medical problems as mentioned above who presented to the hospital on 03/31 secondary to shortness of breath and cough and likely chronic obstructive pulmonary disease exacerbation.  1. Chronic obstructive pulmonary disease exacerbation: Possibly related to underlying bronchitis. The patient was treated with IV steroids, Solu-Medrol, along with around-the-clock nebulizer treatments, maintenance of his inhalers including Combivent and Spiriva and also his theophylline. He was also maintained on empiric antibiotics with Levaquin. A pulmonary consult was obtained. The patient was seen by Dr. Raul Del, who agreed with the management. After getting aggressive therapy for the past 48 hours his clinical symptoms and bronchospasm have significantly improved. He will resume all his home medications including his inhalers, Symbicort, Spiriva. He is being discharged on maintenance prednisone at 20 mg daily along with his theophylline and empiric Levaquin for a few days.  He will follow up with Dr. Raul Del as an outpatient within the next week.  2. Hypertension: The  patient remained hemodynamically stable and was maintained on his Coreg and Imdur, which he will resume upon discharge.  3. History of coronary artery disease, status post coronary artery bypass graft: The patient did have some subjective chest pain. He was maintained on telemetry. He had three sets of cardiac markers, which were negative. Currently he is chest pain free and hemodynamically stable. He will therefore continue his maintenance medications including aspirin, beta blockers, and statin as mentioned.  4. Hyperlipidemia: The patient was maintained on his atorvastatin and niacin and he will resume that upon discharge.   The patient has a high tendency of getting oral thrush when on steroids and therefore he is empirically being discharged on nystatin swish and swallow b.i.d. to prevent any thrush from forming as he is going to be discharged on maintenance prednisone as stated.   TIME SPENT ON DISCHARGE: 40 minutes.    ____________________________ Belia Heman. Verdell Carmine, MD vjs:bjt D: 11/19/2011 14:30:45 ET T: 11/20/2011 10:53:19 ET JOB#: [id number]  cc: Belia Heman. Verdell Carmine, MD, <Dictator> Herbon E. Raul Del, MD Henreitta Leber MD ELECTRONICALLY SIGNED 11/20/2011 15:31

## 2014-12-11 NOTE — H&P (Signed)
PATIENT NAME:  Javier [name], Javier [name] MR#:  [id number] DATE OF BIRTH:  Oct 30, 1935  DATE OF ADMISSION:  11/17/2011  REFERRING PHYSICIAN: Dr. Cinda Quest  PRIMARY CARE PHYSICIAN: Dr. Mila Palmer  PRIMARY PULMONOLOGIST: Dr. Raul Del  PRESENTING COMPLAINT: Cough, wheezing, shortness of breath, headache, nausea, weakness and hemoptysis.   HISTORY OF PRESENT ILLNESS: Javier [name] is a pleasant 79 year old gentleman with history of coronary artery disease status post bypass, chronic obstructive pulmonary disease, former tobacco use, peripheral vascular disease, history of transient ischemic attacks, angina, diastolic dysfunction congestive heart failure who presents today with reports of developing cough approximately one week ago that is nonproductive. By Friday night he developed increased wheezing. He endorses chills and sweats. Reports that earlier this evening developed blood-streaked sputum. Reports that his shortness of breath worsening today. He does not use oxygen at home. He reports a week ago was started on Daliresp but now has been discontinued as it caused him to have nausea, headache and weakness. He saw Dr. Raul Del on Tuesday and was initiated on theophylline 24 and was given steroids shot. He denies any chest pain, palpitations. She endorses presyncope but no syncope.   PAST MEDICAL HISTORY:  1. Last admitted 08/2011 for evaluation of chest pain and shortness of breath thought to be noncardiac.  2. Coronary artery disease status post bypass followed by stents x4 in 2011.  3. Hypertension.  4. Angina.  5. Peripheral vascular disease.  6. History of transient ischemic attacks.  7. Prostate cancer, status post brachytherapy.  8. Hyperlipidemia.  9. Chronic obstructive pulmonary disease and former tobacco use.  10. Diastolic dysfunction congestive heart failure. Echo from December 2012 with ejection fraction of 60% to 65%, mild left ventricular hypertrophy and diastolic dysfunction.   PAST SURGICAL  HISTORY:  1. Coronary artery disease status post bypass and stent placement.  2. Intraocular lens implant after cataract surgery.  3. Carotid endarterectomy bilaterally.   ALLERGIES: Prednisone and Medrol Dosepak causing monilial esophagitis.   MEDICATIONS:  1. Aspirin 162 mg daily.  2. Carvedilol 6.25 mg b.i.d.  3. Zetia 5 mg daily.  4. Imdur 30 mg daily.  5. Protonix 20 mg daily.  6. Centrum Silver multivitamin daily.  7. Vitamin C 1000 mg daily.  8. Atorvastatin 40 mg daily.  9. Omega Red 300 mg fish oil 1 tablet b.i.d.  10. Stool softener 100 mg b.i.d.  11. Os-Cal 500+ D b.i.d.  12. Spiriva daily.  13. Symbicort 160/4.5, 2 puffs b.i.d.  14. Ventolin rescue inhaler as needed.  15. Albuterol inhaler as needed.  16. Levalbuterol nebulizer every six hours as needed.  17. Ipratropium nebulizer every six hours as needed.  18. Furosemide 20 mg as needed.  19. Tessalon Perles 100 mg 2 tablets 3 times a day as needed.  20. Hydrocodone/ APAP ER suspension 5 mL every 12 hours as needed.  21. Alprazolam 0.25 mg every six hours as needed.  22. Tramadol/APAP 37.5 mg b.i.d. as needed.  23. Mucinex 600 mg b.i.d. as needed.  24. Eligard 45 mg/30 mg every six months.  25. Nitrostat 0.4 mg sublingual as needed.  26. Theo-24 ER 200 mg 1 capsule b.i.d.   SOCIAL HISTORY: Lives in [place] with his wife. He quit tobacco in 2011. No alcohol or drug use.   FAMILY HISTORY: Mother with diabetes and heart disease. Father had esophageal and stomach cancer and brother with heart disease.    REVIEW OF SYSTEMS: CONSTITUTIONAL: Endorses chills and sweats. EYES: History of cataracts. ENT: Reports congestion. No epistaxis.  RESPIRATORY: As per history of present illness. CARDIOVASCULAR: Denies any chest pain. He endorses orthopnea. He has edema at baseline. No palpitations or syncope. Endorses presyncope. GASTROINTESTINAL: Endorses nausea but no vomiting. No diarrhea, abdominal pain, hematemesis, or melena.  GENITOURINARY: No dysuria, hematuria. ENDO: No polyuria or polydipsia. HEMATOLOGIC: No easy bleeding. SKIN: No ulcers. MUSCULOSKELETAL: No joint pain or swelling. NEUROLOGIC: History of transient ischemic attacks. PSYCH: Denies any suicidal ideation   PHYSICAL EXAMINATION:  VITAL SIGNS: Temperature 97.8, pulse 88, respiratory rate 22, blood pressure 171/79, sating at 93% on room air.   GENERAL: Lying in bed in no apparent distress.   HEENT: Normocephalic, atraumatic. Pupils equal, symmetric, nonicteric sclerae. Nares without discharge. Moist mucous membrane.   NECK: Soft and supple. No adenopathy. He has mildly elevated JVP of approximately 7 cm.   CARDIOVASCULAR: Non-tachy. No murmurs, rubs, or gallops.   LUNGS: Diffuse wheezing. No use of accessory muscles or increased respiratory effort. There is also coarse breath sounds.   EXTREMITIES: 2+ edema of the ankles bilaterally. Dorsal pedis pulses intact.   MUSCULOSKELETAL: No joint effusion.   SKIN: No ulcers.   NEUROLOGIC: No dysarthria or aphasia. Symmetrical strength. No focal deficits.   PSYCH: He is alert and oriented. Patient is cooperative.   LABORATORY, DIAGNOSTIC AND RADIOLOGICAL DATA: Chest x-ray without acute findings. Troponin less than 0.02. WBC 11.8, hemoglobin 12.4, hematocrit 37.5, platelet 201, MCV 92, glucose 121, BUN 23, creatinine 0.95, sodium 143, potassium 4.1, chloride 108, carbon dioxide 24, calcium 8.2, total bilirubin 0.3, alkaline phosphatase 63, ALT 32, AST 47, total protein 7. EKG with sinus rate of 88. No ST elevation or depression. There is T wave inversion in aVR.   ASSESSMENT AND PLAN: Javier [name] is a pleasant 79 year old gentleman with history of chronic obstructive pulmonary disease, coronary artery disease status post bypass, diastolic dysfunction, congestive heart failure, angina, peripheral vascular disease, hypertension, hyperlipidemia presenting with cough, shortness of breath, wheezing, hemoptysis.   1. Chronic obstructive pulmonary disease exacerbation without evidence of infiltrate on x-ray, likely some bronchitis. Will continue on Levaquin, Solu-Medrol, SVNs, oxygen as needed and Spiriva. Continue Symbicort, Theo-24. Will also get theophyline level. Will resumes Mucinex. Follow hemoglobin. Hemoptysis is likely in the setting of the persistent cough and inflammatory process. Again, follow closely.  2. Diastolic dysfunction congestive heart failure, currently euvolemic. Continue ins and outs, daily weights. Restart carvedilol and Lasix if needed.  3. Coronary artery disease status post bypass with history of angina. Restart aspirin,. carvedilol, Zetia, atorvastatin, Imdur and nitroglycerin sublingual as needed.  4. History of hypertension. As above, restart carvedilol.  5. Prophylaxis with Protonix, aspirin and Lovenox.   TIME SPENT: Approximately 50 minutes spent on patient care.   ____________________________ Rita Ohara, MD ap:cms D: 11/17/2011 01:45:50 ET T: 11/17/2011 09:58:51 ET JOB#: [id number]  cc: Brien Few Rylynn Schoneman, MD, <Dictator> Francee Nodal, MD Rita Ohara MD ELECTRONICALLY SIGNED 12/05/2011 2:00

## 2014-12-11 NOTE — Telephone Encounter (Signed)
Would continue to wear the monitor Would check with the company that he can extend the time

## 2014-12-11 NOTE — Consult Note (Signed)
PATIENT NAME:  Javier [name], Javier [name] MR#:  [id number] DATE OF BIRTH:  01/27/1936  DATE OF CONSULTATION:  01/13/2012  REFERRING PHYSICIAN:   CONSULTING PHYSICIAN:  Tallyn Holroyd A. Fletcher Anon, MD  PRIMARY CARE PHYSICIAN:  Dr. Mila Palmer  PRIMARY CARDIOLOGIST:  Dr. Rockey Situ   REASON FOR CONSULTATION: Syncope and chest pain.   HISTORY OF PRESENT ILLNESS: This is a 79 year old gentleman with known history of coronary artery disease status post coronary artery bypass graft surgery in 2008 followed by PCI a few months after that. He is known to have normal LV systolic function. He also has history of chronic obstructive pulmonary disease. He had multiple recent admissions for chronic obstructive pulmonary disease. He was hospitalized in December of last year with chronic obstructive pulmonary disease and atypical chest pain. At that time he had a nuclear stress test done which showed evidence of lateral wall infarct with only minimal peri-infarct ischemia and normal ejection fraction. The patient presented at this time after he had a syncopal episode on Saturday. The patient was walking outside when he started having a coughing and sneezing spell which lasted for a few minutes. This was followed by loss of consciousness for a few seconds. He fell on the ground and injured his left hand. The patient has been having problems with coughing spells lately. He reports no syncope during other situations. He had no chest discomfort at that time. He has been having chest pain after the coughing spells but he reports no exertional chest tightness.   PAST MEDICAL HISTORY:  1. Coronary artery disease status post CABG in 2008. Status post PCI with stent placement times four a few months after his CABG.  2. Chronic obstructive pulmonary disease.  3. Peripheral vascular disease, status post bilateral carotid endarterectomy.  4. Hypertension.  5. Hyperlipidemia.  6. Prostate cancer.   MEDICATIONS: 1. Zyrtec 10 mg daily.  2. Xanax  as needed.  3. Theophylline 200 mg b.i.d.  4. Symbicort twice daily.  5. Spiriva 1 capsule daily.  6. Imdur 30 mg daily.  7. Lasix 20 mg daily.  8. Lipitor 40 mg daily.  9. Aspirin 81 mg daily.  10. Protonix 40 mg once daily.  11. Coreg 6.25 mg twice daily.  12. Nitrostat as needed.  13. Xopenex as needed for shortness of breath.  14. Prednisone taper as directed.  15. Levaquin 500 mg once daily.   ALLERGIES: No known drug allergies.   SOCIAL HISTORY: He quit smoking two years ago. He denies any alcohol or recreational drug use.   FAMILY HISTORY: Family history is remarkable for coronary artery disease.   REVIEW OF SYSTEMS: 10-point review of systems was performed. It is negative other than what is mentioned in the history of present illness.   PHYSICAL EXAMINATION:  GENERAL: The patient appears his stated age and in no acute distress.   VITAL SIGNS: Temperature 98.3, pulse 98, respiratory rate 20, blood pressure 121/75, oxygen saturation 94% on 2 liters nasal cannula.   HEENT: Normocephalic, atraumatic.   NECK: No jugular venous distention or carotid bruits.   RESPIRATORY: Normal respiratory effort with no use of accessory muscles. Auscultation reveals bilateral rhonchi and some wheezing.   CARDIOVASCULAR: Normal PMI. Normal S1 and S2 with no gallops. There is a 2/6 systolic ejection murmur at the aortic area, which is early peaking.   ABDOMEN: Benign, nontender, and nondistended.   EXTREMITIES: No clubbing, cyanosis, or edema.   LABORATORY, DIAGNOSTIC, AND RADIOLOGICAL DATA: His labs showed negative cardiac enzymes. CBC was  remarkable for elevated white cell count, but he has been on steroids lately.  Renal function is normal. ECG showed normal sinus rhythm with no significant ST or T wave changes.   IMPRESSION:  1. Cough-induced syncope.  2. Atypical chest pain, likely related to chronic obstructive pulmonary disease and coughing.  3. Known history of coronary artery  disease, status post CABG and PCI with no evidence of acute coronary syndrome.   4. Chronic obstructive pulmonary disease.   RECOMMENDATIONS: The patient's syncope is suggestive of cough-induced syncope and does not seem to be cardiac. His telemetry revealed no arrhythmia and baseline ECG shows no significant abnormalities. His echocardiogram showed normal LV systolic function with aortic sclerosis without significant stenosis. His chest pain is atypical and seems to be related to the coughing episode as well as chronic obstructive pulmonary disease. I recommend continuing medical therapy. The patient did have a nuclear stress test done in January. Thus, no further plans for ischemic work-up at this time. If the patient's anginal symptoms increase in the future then the next step would be to proceed with cardiac catheterization. However, that is not indicated at this point.   ____________________________ Mertie Clause. Fletcher Anon, MD [id number] D: 01/13/2012 16:05:55 ET T: 01/13/2012 17:12:33 ET JOB#: [id number]  cc: Ovetta Bazzano A. Fletcher Anon, MD, <Dictator> Minna Merritts, MD Antony Salmon, MD Rogue Jury Ferne Reus MD ELECTRONICALLY SIGNED 01/28/2012 13:09

## 2014-12-12 NOTE — Telephone Encounter (Signed)
Spoke w/ pt's wife.  Advised her of Dr. Donivan Scull recommendation.  She reports that pt has not worn his monitor since his d/c from hospital.  States that pt is going to the [facility], but "just for a few days to get his meds straightened out." She is not sure if pt will want to continue to wear the monitor, but she will discuss w/ him and call back & let us know if we need to call [employer] to extend the time.

## 2014-12-18 NOTE — Consult Note (Signed)
General Aspect Primary Cardiologist: Dr. Rockey Situ, MD _______________  79 year old male with history of CAD s/p CABG in 2007, occlusion of 2 vein grafts with PCI x 4, chronic a-fib on Eliquis, COPD, peripheral vascular disease with bilateral carotid endarterectomies in 2002, history of TIA/seizure disorder on Keppra, recent onset of syncopal episodes with 3 in the last 3-4 weeks who returns to [facility] again on 3/31 with the third syncopal episode discribed as above.  _______________  PMH: 1. CAD s/p CABG in 2007, occlusion of 2 vein grafts with PCI x 4 2. Chronic a-fib on Eliquis 3. COPD 4. PVD with bilateral CEA in 2002 5. History of TIA/seizure disorder on Keppra 6. Recent onset of syncopal episodes with 3 in the last 3-4 weeks 7. AAA s/p endovascular repair 09/2013 8. HTN ________________   Present Illness 79 year old male with the above problem list who presented to [facility] on 3/31 with the above CC. He has known h/o CAD s/p CABG in 2007.  His last cath was in 06/2013, revealing patent LM stent with 2/3 patent grafts (known occlusion to the VG->Diag).  He also has a h/o chronic diast chf (EF 50-55% 09/2013), a-fib (on eliquis), remote tobacco abuse (1/2 ppd x 50 yrs, quit 2014) with O2 dependent COPD and chronic bronchitis, AAA s/p endovascular repair 09/2013, HTN, TIA s/p bilat CEA (2002), and a long h/o orthostatic hypotension with periodic syncope.   He has had 9 hospital admissions over the past calendar year, most for chest pain/chf exacerbations. Admitted in 01/07/14 with syncope, turned in 30 day event monitor. No arrhythmia noted, though he had no syncope. In the hospital July 20 for chest pain. Cardiac enzymes negative x3, had been working with physical therapy with no symptoms. Chest pain came on at rest. Stress test showed ejection fraction 46%, fixed defect in the inferolateral region, previous scar. He was started on isosorbide. October 2015 admission for syncope/seizure for EEG given prior  history of syncope. MRI at that time showed only chronic small vessel disease. He returns to [facility] again today with a third syncopal episode. He reports all of his syncopal episodes have happened at nighttime. Each one has occurred either after a positional change or after voiding. Never with any associated chest pain or palpitations, though he has had some palpitations. He also notes increased SOB. He reports his anxiety is worsening. He also reports he is becoming more unpleasant and prefers to go and be by himself more now.   Last evening after going to void he had walked no more than 10 feet when he got back to his bed an apparently had a syncopal episode. He did not have any lose of bowel or bladder function. No reported seizure activity. He is not sure how long he was out. Upon waking he noted that he had a severe headache. He notes having frequent severe headaches since starting Keppra. He also reports 2/2 to his anxiety having to take Xanax and morphine at times for SOB.   Upon his arrival to [facility] he was found to have BP that was within normal limits, however he was found to be orthostatic. Troponin negative x 3. WBC coutn 11.4. EKG a-fib, 99 bpm, low voltage, no significant st/t changes. Carotid dopplers with <50% stenosis bilaterally. CT head with no acute intracranial pathology seen on CT. Moderate cortical volume loss and scattered small vessel ischemic microangiopathy.   Physical Exam:  GEN no acute distress   HEENT PERRL, hearing intact to  voice, moist oral mucosa   NECK supple  no JVD   RESP normal resp effort  clear BS   CARD Irregular rate and rhythm  No murmur   ABD denies tenderness  soft   EXTR negative edema   SKIN normal to palpation, skin turgor decreased   NEURO cranial nerves intact   PSYCH alert   Review of Systems:  General: Fatigue  Weakness   Skin: No Complaints   ENT: No Complaints   Eyes: No Complaints   Neck: No Complaints   Respiratory: Short of  breath   Cardiovascular: Palpitations   Gastrointestinal: No Complaints   Genitourinary: No Complaints   Vascular: No Complaints   Musculoskeletal: No Complaints   Neurologic: Dizzness  Fainting   Hematologic: No Complaints   Endocrine: No Complaints   Psychiatric: No Complaints   Review of Systems: All other systems were reviewed and found to be negative   Medications/Allergies Reviewed Medications/Allergies reviewed   Family & Social History:  Family and Social History:  Family History mother: CAD; father: stomach CA   Social History negative ETOH, negative Illicit drugs   + Tobacco Prior (greater than 1 year)   Place of Living Home     Remote Tobacco Abuse: a. 1/3 - 1/2 ppd x 50+ yrs.  Quit 2014.   AAA: a. 09/2013 s/p endovascular stenting.   COPD with recurrent Bronchitis: a. On Home O2.   Orthostatic Hypotension and Syncope: a. On florinef;  b. 09/2013 Echo: EF 50-55%.   Permanent Afib: a. CHA2DS2VASc = 7, On eliquis.   Chronic Diastolic CHF: a. 08/6965 Echo: EF 50-55%, mild LVH, low nl RV fxn, mild to mod MR/TR.   Carotid Arterial Dzs: a. 2002 s/p bilat CEA.   TIA/CVA: a. 2002 s/p bilat CEA.   CAD: a. s/p CABG 2007;  b. s/p prior stenting including LM stenting;  c. 06/2013 Myoview: Lat ischemia;  d.  06/2013 Cath: LM stent patent, LIMA->LAD ok, VG->OM3 ok, VG->Diag known to be occluded.   afib:    Nocturnal hypoxia on noct 02:    Anemia, Chronic:    CVA/Stroke:    MI - Myocardial Infarct:    prostate cancer with brachytherapy:    peripheral vascular disease:    HTN:    CAD:    COPD:    Bronchitis:    High cholesterol:    angina:    AAA repair:    CABG (Coronary Artery Bypass Graft):    lasik surgery both eyes:    4 cardiac stents:    Carotid Endarterectomy:   Home Medications: Medication Instructions Status  morphine 20 mg/mL oral concentrate 0.25 milliliter(s) orally every 2 hours, As needed, shortness of breath Active   Symbicort 160 mcg-4.5 mcg/inh inhalation aerosol 2 puff(s) inhaled 2 times a day Active  Eligard 45 mg/6 months subcutaneous injection, extended release 45 milligram(s) subcutaneous every 6 months Active  Nitrostat 0.4 mg sublingual tablet 1 tab(s) sublingual every 5 minutes, As Needed - for Chest Pain Active  Crestor 20 mg oral tablet 1 tab(s) orally once a day (in the evening) Active  Fish Oil 1000 mg oral capsule 1 cap(s) orally 2 times a day Active  docusate sodium 100 mg oral tablet 1 tab(s) orally 2 times a day Active  Ventolin HFA 90 mcg/inh inhalation aerosol 2 puff(s) inhaled every 4 hours, As Needed - for Shortness of Breath Active  Spiriva 18 mcg inhalation capsule 1 cap(s) inhaled once a day (in the morning) Active  Senna 8.6 mg oral tablet 1 tab(s) orally once a day (in the morning) Active  Mucinex 600 mg oral tablet, extended release 1 tab(s) orally 2 times a day, As Needed for cough/congestion Active  pantoprazole 40 mg oral delayed release tablet 1 tab(s) orally 2 times a day Active  bisoprolol 5 mg oral tablet 1 tab(s) orally 2 times a day Active  midodrine 5 mg oral tablet 1 tab(s) orally 3 times a day, As Needed for low blood pressure.  Active  Vitamin C 1000 mg oral tablet 1 tab(s) orally once a day (in the morning) Active  Eliquis 5 mg oral tablet 1 tab(s) orally 2 times a day Active  ALPRAZolam 0.5 mg oral tablet 1 tab(s) orally every 6 hours, As Needed - for Anxiety, Nervousness Active  albuterol-ipratropium 2.5 mg-0.5 mg/3 mL inhalation solution 3 milliliter(s) inhaled 4 times a day, As Needed - for Shortness of Breath Active  nystatin 100000 units/mL oral suspension 5 milliliter(s) orally 2 times a day Active  Hydromet 1.5 mg-5 mg/5 mL oral syrup 5 milliliter(s) orally every 6 hours, As Needed - for Pain Active  Robitussin Peak Cold Maximum Strenth Multi-Symptom 10 mg-200 mg-5 mg/5 mL oral liquid 10 milliliter(s) orally every 4 hours, As Needed for cough Active   citalopram 10 mg oral tablet 1 tab(s) orally once a day (in the morning) Active  ferrous sulfate 325 mg oral tablet 1 tab(s) orally 2 times a day Active  isosorbide mononitrate 30 mg oral tablet, extended release 1 tab(s) orally once a day (in the evening) Active  Vitamin B-12 1000 mcg oral tablet 1 tab(s) orally once a day (in the morning) Active  levETIRAcetam 750 mg oral tablet 0.5 tab(s) orally 2 times a day Active  furosemide 20 mg oral tablet 1 tab(s) orally once a day (in the morning) Active  potassium chloride 20 mEq oral tablet, extended release 1 tab(s) orally once a day (in the morning) Active   Lab Results:  Hepatic:  31-Mar-16 00:56   Bilirubin, Total 0.3 (0.3-1.2 NOTE: New Reference Range  10/25/14)  Alkaline Phosphatase 46 (38-126 NOTE: New Reference Range  10/25/14)  SGPT (ALT)  13 (17-63 NOTE: New Reference Range  10/25/14)  SGOT (AST) 20 (15-41 NOTE: New Reference Range  10/25/14)  Total Protein, Serum 7.2 (6.5-8.1 NOTE: New Reference Range  10/25/14)  Albumin, Serum 3.7 (3.5-5.0 NOTE: New reference range  10/25/14)  Routine Chem:  31-Mar-16 00:56   Glucose, Serum  124 (65-99 NOTE: New Reference Range  10/25/14)  BUN  23 (6-20 NOTE: New Reference Range  10/25/14)  Creatinine (comp) 1.02 (0.61-1.24 NOTE: New Reference Range  10/25/14)  Sodium, Serum 138 (135-145 NOTE: New Reference Range  10/25/14)  Potassium, Serum 4.4 (3.5-5.1 NOTE: New Reference Range  10/25/14)  Chloride, Serum 104 (101-111 NOTE: New Reference Range  10/25/14)  CO2, Serum 27 (22-32 NOTE: New Reference Range  10/25/14)  Calcium (Total), Serum 9.0 (8.9-10.3 NOTE: New Reference Range  10/25/14)  eGFR (African American) >60  eGFR (Non-African American) >60 (eGFR values <56m/min/1.73 m2 may be an indication of chronic kidney disease (CKD). Calculated eGFR is useful in patients with stable renal function. The eGFR calculation will not be reliable in acutely ill  patients when serum creatinine is changing rapidly. It is not useful in patients on dialysis. The eGFR calculation may not be applicable to patients at the low and high extremes of body sizes, pregnant women, and vegetarians.)  Anion Gap 7  Cardiac:  31-Mar-16 00:56  Troponin I <0.03 (0.00-0.03 0.03 ng/mL or less: NEGATIVE  Repeat testing in 3-6 hrs  if clinically indicated. >0.05 ng/mL: POTENTIAL  MYOCARDIAL INJURY. Repeat  testing in 3-6 hrs if  clinically indicated. NOTE: An increase or decrease  of 30% or more on serial  testing suggests a  clinically important change NOTE: New Reference Range  10/25/14)    06:51   Troponin I <0.03 (0.00-0.03 0.03 ng/mL or less: NEGATIVE  Repeat testing in 3-6 hrs  if clinically indicated. >0.05 ng/mL: POTENTIAL  MYOCARDIAL INJURY. Repeat  testing in 3-6 hrs if  clinically indicated. NOTE: An increase or decrease  of 30% or more on serial  testing suggests a  clinically important change NOTE: New Reference Range  10/25/14)    10:26   Troponin I <0.03 (0.00-0.03 0.03 ng/mL or less: NEGATIVE  Repeat testing in 3-6 hrs  if clinically indicated. >0.05 ng/mL: POTENTIAL  MYOCARDIAL INJURY. Repeat  testing in 3-6 hrs if  clinically indicated. NOTE: An increase or decrease  of 30% or more on serial  testing suggests a  clinically important change NOTE: New Reference Range  10/25/14)  Routine UA:  31-Mar-16 01:19   Color (UA) Yellow  Clarity (UA) Clear  Glucose (UA) Negative  Bilirubin (UA) Negative  Ketones (UA) Negative  Specific Gravity (UA) 1.016  Blood (UA) Negative  pH (UA) 5.0  Protein (UA) Negative  Nitrite (UA) Negative  Leukocyte Esterase (UA) Negative (Result(s) reported on 17 Nov 2014 at 03:45AM.)  RBC (UA) <1 /HPF  WBC (UA) 1 /HPF  Bacteria (UA) NONE SEEN  Epithelial Cells (UA) <1 /HPF  Mucous (UA) PRESENT (Result(s) reported on 17 Nov 2014 at 03:45AM.)  Routine Coag:  31-Mar-16 00:56   Prothrombin 13.6  (11.4-15.0 NOTE: New Reference Range  09/16/14)  INR 1.0 (INR reference interval applies to patients on anticoagulant therapy. A single INR therapeutic range for coumarins is not optimal for all indications; however, the suggested range for most indications is 2.0 - 3.0. Exceptions to the INR Reference Range may include: Prosthetic heart valves, acute myocardial infarction, prevention of myocardial infarction, and combinations of aspirin and anticoagulant. The need for a higher or lower target INR must be assessed individually. Reference: The Pharmacology and Management of the Vitamin K  antagonists: the seventh ACCP Conference on Antithrombotic and Thrombolytic Therapy. HDQQI.2979 Sept:126 (3suppl): [id number]. A HCT value >55% may artifactually increase the PT.  In one study,  the increase was an average of 25%. Reference:  "Effect on Routine and Special Coagulation Testing Values of Citrate Anticoagulant Adjustment in Patients with High HCT Values." American Journal of Clinical Pathology 2006;126:400-405.)  Activated PTT (APTT) 30.3 (A HCT value >55% may artifactually increase the APTT. In one study, the increase was an average of 19%. Reference: "Effect on Routine and Special Coagulation Testing Values of Citrate Anticoagulant Adjustment in Patients with High HCT Values." American Journal of Clinical Pathology 2006;126:400-405.)  Routine Hem:  31-Mar-16 00:56   WBC (CBC)  11.4  RBC (CBC)  4.16  Hemoglobin (CBC)  12.6  Hematocrit (CBC)  38.2  Platelet Count (CBC) 181  MCV 92  MCH 30.2  MCHC 32.9  RDW 14.4  Neutrophil % 79.6  Lymphocyte % 13.8  Monocyte % 4.6  Eosinophil % 0.9  Basophil % 1.1  Neutrophil #  9.1  Lymphocyte # 1.6  Monocyte # 0.5  Eosinophil # 0.1  Basophil # 0.1 (Result(s) reported on 17 Nov 2014 at 01:14AM.)   EKG:  EKG Interp. by  me   Interpretation a-fib, 99 bpm, low voltage, no significant st/t changes   Radiology Results: [place]:    31-Mar-16  11:53, US Carotid Doppler Bilateral  US Carotid Doppler Bilateral   REASON FOR EXAM:    syncope, hx of carotid artery disease s/p CEA  COMMENTS:       PROCEDURE: [place]  - US CAROTID DOPPLER BILATERAL  - Nov 17 2014 11:53AM     CLINICAL DATA:  Syncope    EXAM:  BILATERAL CAROTID DUPLEX ULTRASOUND    TECHNIQUE:  Pearline Cables scale imaging, color Doppler and duplex ultrasound were  performed of bilateral carotid and vertebral arteries in the neck.    COMPARISON:  01/07/2014  FINDINGS:  Criteria: Quantification of carotid stenosis is based on velocity  parameters that correlatethe residual internal carotid diameter  with NASCET-based stenosis levels, using the diameter of the distal  internal carotid lumen as the denominator for stenosis measurement.    The following velocity measurements were obtained:    RIGHT    ICA:  64 cm/sec    CCA:  69 cm/sec    SYSTOLIC ICA/CCA RATIO:  0.9  DIASTOLIC ICA/CCA RATIO:    ECA:  68 cm/sec    LEFT    ICA:  64 cm/sec    CCA:  82 cm/sec    SYSTOLIC ICA/CCA RATIO:  0.8    DIASTOLIC ICA/CCA RATIO:    ECA:  1 1 cm/sec  RIGHT CAROTID ARTERY: Minimal plaque in the bulb. Low resistance  internal carotid Doppler pattern.    RIGHT VERTEBRAL ARTERY: Antegrade with a low resistance Doppler  pattern.    LEFT CAROTID ARTERY: There is focal mild calcified plaque in the  common carotid superiorly. Mild calcified plaque in the bulb. Low  resistance internal carotid Doppler pattern.    LEFT VERTEBRAL ARTERY: Antegrade with a low resistance Doppler  pattern.     IMPRESSION:  Less than 50% stenosis in a right and left internal carotid  arteries.      Electronically Signed    By: Marybelle Killings M.D.    On: 11/17/2014 13:19         Verified By: Jamas Lav, M.D.,  CT:    31-Mar-16 01:48, CT Head Without Contrast  CT Head Without Contrast   REASON FOR EXAM:    "worst headache of life"  COMMENTS:       PROCEDURE: CT  - CT HEAD WITHOUT  CONTRAST  - Nov 17 2014  1:48AM     CLINICAL DATA:  Worse headache of life, acute onset. Found  unresponsive. Initial encounter.    EXAM:  CT HEAD WITHOUT CONTRAST    TECHNIQUE:  Contiguous axial images were obtained from the base of the skull  through the vertex without intravenous contrast.  COMPARISON:  CT of the head performed 07/13/2014    FINDINGS:  There is no evidence of acute infarction, mass lesion, or intra- or  extra-axial hemorrhage on CT.    Prominence of the ventricles and sulci reflects moderate cortical  volume loss. Cerebellar atrophy is noted. Scattered periventricular  and subcortical white matter change likely reflects small vessel  ischemic microangiopathy.    The brainstem and fourth ventricle are within normal limits. The  basal ganglia are unremarkable in appearance. The cerebral  hemispheres demonstrate grossly normal gray-white differentiation.  No mass effect or midline shift is seen.  There is no evidence of fracture; visualized osseous structures are  unremarkable in appearance. The orbits are within normal  limits. The  paranasal sinuses and mastoid air cells are well-aerated. No  significant soft tissue abnormalities are seen.     IMPRESSION:  1. No acute intracranial pathology seen on CT.  2. Moderate cortical volume loss and scattered small vessel ischemic  microangiopathy.      Electronically Signed    By: Garald Balding M.D.    On: 11/17/2014 02:20     Verified By: JEFFREY . CHANG, M.D.,    No Known Allergies:   Vital Signs/Nurse's Notes: **Vital Signs.:   31-Mar-16 13:29  Vital Signs Type Routine  Temperature Temperature (F) 97.6  Celsius 36.4  Temperature Source oral  Pulse Pulse 83  Respirations Respirations 18  Systolic BP Systolic BP 919  Diastolic BP (mmHg) Diastolic BP (mmHg) 69  Mean BP 86  Systolic BP Systolic BP 166  Diastolic BP (mmHg) Diastolic BP (mmHg) 69  Pulse Lying Pulse Lying 83  Systolic BP Systolic BP  060  Diastolic BP (mmHg) Diastolic BP (mmHg) 67  Pulse Pulse Sitting 88  Systolic BP Systolic BP 045  Diastolic BP (mmHg) Diastolic BP (mmHg) 70  Pulse Standing Pulse Standing 87  Pulse Ox % Pulse Ox % 98  Pulse Ox Activity Level  At rest  Oxygen Delivery 2L    Impression 79 year old male with history of CAD s/p CABG in 2007, occlusion of 2 vein grafts with PCI x 4, chronic a-fib on Eliquis, COPD, peripheral vascular disease with bilateral carotid endarterectomies in 2002, history of TIA/seizure disorder on Keppra, recent onset of syncopal episodes with 3 in the last 3-4 weeks who returns to [facility] again on 3/31 with the third syncopal episode discribed as above.  1. Syncope: -Possibly multifactorial including orthostatic hypotension, medication induced (Xanax and morphine though he denies taking these around the time of his syncopal episodes), and possible cardiogenic  -Has previously worn a 30 day event monitor back in July 2015 that did not show any significant arrhythmia or pause, though he did not have any syncopal episode at that time -Given his syncope appears to be so frequent currently it may not be a bad idea to repeat this study at this time, if this is unrevealing at this time could consider ILR as a next step in his evaluation -He also appears to be orthostatic. His syncopal episodes have happened with positonal changes or post micturation. He was previously on both Floinef and midodrine, but has not been taking/required either. Could consider scheduled midodrine, d/w MD.  -Echo pending -CT head non acute -Recent MRI brain 04/2014 negative -Neurology has been consulted -Prior EEG done  2. Chronic diastolic CHF: -Check echo as above -Has been more SOB lately, though weight stable and no edema -Continue home medications  3. CAD s/p CABG s/p multiple PCI: -No angina -Continue current medications  4. COPD: -More SOB lately -Continue home meds  5. Chronic a-fib: -Rate  controlled -On Eliquis -Continue bisoprolol  6. Orthostatic hypotension: -Consider scheduled midodrine as above, d/w MD -Likely cause of his syncope  7. Anxiety   Electronic Signatures for Addendum Section:  Kathlyn Sacramento (MD) (Signed Addendum 31-Mar-16 17:46)  The patient was seen and examined. he is well known to [place] with multiple admissions for chest pain and syncope. He presented with a syncopal episode shortly after he urinated and walked. He was out for 10 minutes !!. He was found to be orthostatic. No murmurs by exam. echo showed normal EF with mild inferior and inferolateral hypokinesis.  Given recurrent syncope,  should consider a loop recorder. He was evaluated for this by Dr. Caryl Comes before.   Electronic Signatures: Kathlyn Sacramento (MD)  (Signed 31-Mar-16 17:46)  Co-Signer: General Aspect/Present Illness, History and Physical Exam, Review of System, Family & Social History, Past Medical History, Home Medications, Labs, EKG , Radiology, Allergies, Vital Signs/Nurse's Notes, Impression/Plan Idolina Primer, Carmin Dibartolo M (PA-C)  (Signed 31-Mar-16 16:46)  Authored: General Aspect/Present Illness, History and Physical Exam, Review of System, Family & Social History, Past Medical History, Home Medications, Labs, EKG , Radiology, Allergies, Vital Signs/Nurse's Notes, Impression/Plan   Last Updated: 31-Mar-16 17:46 by Kathlyn Sacramento (MD)

## 2014-12-18 NOTE — Consult Note (Signed)
PATIENT NAME:  Javier [name], Javier [name] MR#:  [id number] DATE OF BIRTH:  12/26/1935  DATE OF CONSULTATION:  11/17/2014  CONSULTING PHYSICIAN:  Leotis Pain, MD  REASON FOR CONSULTATION: Syncopal event.  HISTORY OF PRESENT ILLNESS: A 79 year old Caucasian male with past medical history of hypertension, coronary artery disease, CABG, atrial fibrillation on Eliquis that he is compliant with, COPD, home oxygen, CHF, history of strokes and TIAs, presented with increased falls and there was suspicion of loss of consciousness. The patient sees Dr. Melrose Nakayama as outpatient. Dr. Melrose Nakayama started the patient on Keppra 375 mg twice a day. Status post EEG monitoring as outpatient. The patient currently came back to baseline. CT of the head did not show any acute intracranial pathology.   PAST MEDICAL HISTORY: Hypertension, hyperlipidemia, chronic atrial fibrillation on Eliquis, coronary artery disease, status post CABG, carotid artery disease, COPD on home oxygen, chronic systolic heart failure, anemia, hyperlipidemia, prostate cancer, peripheral arterial disease.  PAST SURGICAL HISTORY: Include CABG, carotid endarterectomy, cardiac stents, abdominal aortic aneurysm repair, LASIK surgery for his eyes, prostate surgery.   ALLERGIES: No known drug allergies.   FAMILY HISTORY: Father had throat cancer.   SOCIAL HISTORY: No smoking since 2014, alcohol, or drug use.   HOME MEDICATIONS: Reviewed.   IMAGING: As described above. CAT scan of the head no acute intracranial abnormality.   REVIEW OF SYSTEMS: Negative for fever or chills. No fatigue. Positive for syncopal events. No shortness of breath. No chest pain. No anxiety. No depression.   NEUROLOGIC EVALUATION: The patient is awake, alert and oriented to time, place, location, and the reason why he is in the hospital. Facial sensation intact. Facial motor is intact. Tongue is midline. Uvula elevates symmetrically. No weakness on one side of the body compared to the  other. Sensation intact to light touch and temperature. Coordination: Finger-to-nose intact.   IMPRESSION: A 79 year old gentleman, multiple admissions for syncopal events with past medical history of coronary artery disease, status post coronary artery bypass grafting, questionable seizure activity on low dose of Keppra at 375 mg twice a day, atrial fibrillation on Eliquis comes in with frequent syncopal episodes. Found to be orthostatic in the Emergency Department. Based on questioning and follow up, I do not suspect this is seizure activity. The patient sees Dr. Melrose Nakayama. He should follow up with him as outpatient. At this point, would not change his Keppra medication of 375 mg twice a day. The patient is status post CAT scan in 2015, status post MRI, no acute intracranial abnormality. No focal neurological deficits on examination at this point. Hydration, monitor for further orthostasis, and likely discharge planning. Follow up with Dr. Melrose Nakayama.   Thank you. It was a pleasure seeing this patient.    ____________________________ Leotis Pain, MD yz:bm D: 11/17/2014 19:57:49 ET T: 11/18/2014 06:49:28 ET JOB#: [id number]  cc: Leotis Pain, MD, <Dictator> Leotis Pain MD ELECTRONICALLY SIGNED 11/22/2014 15:49

## 2014-12-18 NOTE — H&P (Signed)
PATIENT NAME:  Javier [name], Javier [name] MR#:  [id number] DATE OF BIRTH:  Jan 24, 1936  DATE OF ADMISSION:  10/03/2014  PRIMARY CARE PROVIDER:  Barbette Reichmann, MD.   CARDIOLOGIST:  Ida Rogue, MD    CHIEF COMPLAINT: Shortness of breath, wheezing.   HISTORY OF PRESENTING ILLNESS:  A 79 year old Caucasian male patient with history of advanced chronic obstructive pulmonary disease, chronic respiratory failure on 2 liters oxygen chronic diastolic congestive heart failure, coronary artery disease, transient ischemic attack, presents to the hospital complaining of worsening shortness of breath, wheezing. The patient saw Dr. Raul Del, his pulmonologist, about 10 days back, was started on ciprofloxacin and prednisone without any relief and the patient feels extremely short of breath with minimal activity and presented to the ER. Here, the patient is at baseline of 2 liters of oxygen, which he normally wears, but with significant wheezing and tachypnea and is being admitted to the hospitalist service.   The patient mentions that he always has some shortness of breath and wheezing but his symptoms are acutely worse at this point. He has received 125 mg of Solu-Medrol along with multiple nebulizer treatment with no improvement in the Emergency Room.   PAST MEDICAL HISTORY:  1.  Chronic obstructive pulmonary disease.  2.  Chronic respiratory failure on 2 liters oxygen.  3.  Coronary artery disease.  4.  Chronic diastolic congestive heart failure.  5.  Peripheral vascular disease.  6.  Obstructive sleep apnea.  7.  Hypertension.  8.  Transient ischemic attack.  9.  CVA status post TPA in the past.  10. Prostate cancer.  11. Chronic atrial fibrillation.  12. Chronic anticoagulation on Eliquis.   PAST SURGICAL HISTORY:  1.  Lasix.  2.  Prostate surgery.  3.  Cardiac stents.  4.  CABG. 5.  Carotid endarterectomy.   ALLERGIES: No known drug allergies.   FAMILY HISTORY: Throat cancer in his father,  myocardial infarction in his mother.   SOCIAL HISTORY: The patient smoked in the past, but quit in 2014.  No alcohol. No illicit drugs.  Lives at home with his wife and daughter and son-in-law.   He uses walker or cane most times.   CODE STATUS: DNR-DNI.   REVIEW OF SYSTEMS: CONSTITUTIONAL:  Complains of significant fatigue and weakness.  EYES: No blurred vision, pain or redness.  ENT: No tinnitus, ear pain, hearing loss.  RESPIRATORY: He has chronic obstructive pulmonary disease along with wheezing and shortness of breath.  CARDIOVASCULAR: No chest pain, orthopnea, edema.  GASTROINTESTINAL: No nausea, vomiting, diarrhea, abdominal pain.  GENITOURINARY:  No dysuria, or frequency.  ENDOCRINE: No polyuria, nocturia, thyroid problems.  HEMATOLOGIC AND LYMPHATIC: No anemia, easy bruising, bleeding.  INTEGUMENTARY: No acne, rash, lesion.  MUSCULOSKELETAL: Has arthritis back pain.  NEUROLOGIC: has chronic mild left-sided weakness.  PSYCHIATRIC: Has depression.   HOME MEDICATIONS:  1.  DuoNeb 3 mL 4 times a day as needed.  2.  Alprazolam 0.5 mg every 6 hours as needed.  3.  Aspirin 81 mg daily.  4.  Bisoprolol 5 mg oral 2 times a day.  5.  Citalopram 10 mg oral once a day.  6.  Crestor 20 mg oral once a day.  7.  Docusate sodium 100 mg oral 2 times a day.  8.  Eliquis 5 mg 2 times a day.  9.  Ferrous sulfate 325 mg oral 2 times a day. 10. Fish oil 1000 mg oral 2 times a day.  11. Lasix 20 mg daily.  12.  Hydromet 5 mL every 6 hours as needed for pain.  13. Isosorbide mononitrate 30 mg oral once a day.  14. Keppra 375 mg 2 times a day. 15. Midodrine 5 mg oral 3 times a day as needed for low blood pressure.  16. Mucinex 600 mg extended release 2 times a day.  17. Nitrostat 0.4 sublingual as needed for chest pain.  18. Protonix 40 mg 2 times a day.  19. Potassium chloride 20 mEq daily.  20. Senna 8.6 mg oral once a day.  21. Spiriva 8 mcg inhaled daily.  22. Symbicort 2 puffs  inhaled 2 times a day.  23. Ventolin HFA 2 puffs inhaled every 4 hours as needed for shortness of breath.  24. Vitamin B12 1000 mcg daily.  25. Vitamin C 1000 mg daily.   PHYSICAL EXAMINATION:  VITAL SIGNS: Temperature 98.4, pulse of 115, blood pressure 117/81, saturating 98% on 2 liters oxygen.  GENERAL: Obese, Caucasian male patient sitting up in bed once and has conversational dyspnea. Has audible wheezes.  PSYCHIATRIC: Alert and oriented x 3, pleasant.  HEENT: Atraumatic, normocephalic. Oral mucosa moist and pink. External ears and nose normal.  Pallor positive. No icterus. Pupils bilaterally equal and reactive to light.  NECK: Supple. No thyromegaly. No palpable lymph nodes. Trachea midline. , No JVD.  CARDIOVASCULAR: S1, S2, without any murmurs. Peripheral pulses 2+. No edema.  RESPIRATORY: Increased work of breathing with bilateral wheezes and decreased air entry on both sides.  GASTROINTESTINAL: Soft abdomen, nontender. Bowel sounds present. No organomegaly palpable.  SKIN: Warm and dry. No petechiae, rash, ulcers.  MUSCULOSKELETAL: No joint swelling, redness, effusion of the large joints. Normal muscle tone.  NEUROLOGICAL: Motor strength 5/5 in upper and lower extremities. Sensation to fine touch intact all over.  LYMPHATIC: No cervical lymphadenopathy.   LABORATORY DATA:  Shows glucose of 118.  BNP of 2100, BUN 24, creatinine 1.09, sodium 140, potassium 4, chloride 108. AST, ALT, alkaline phosphatase, bilirubin normal. Troponin less than 0.02. WBC 12.0, hemoglobin 10.4, platelets of 197,000.  INR 1.2.    EKG shows atrial fibrillation.   Chest x-ray PA and lateral, showed stable cardiomegaly.   ASSESSMENT AND PLAN:  1.  Chronic obstructive pulmonary disease exacerbation with no response to outpatient therapy by his pulmonologist. At this point, the patient will be admitted for IV steroids and scheduled nebulizer therapy. The patient is close to baseline, but has severe wheezing on  examination, should benefit from treatment in the hospital under observation with some IV steroids and scheduled nebulizers and monitoring.  Hopefully the patient can be discharged home in 1 to 2 days.  2.  Chronic atrial fibrillation mildly increased heart rate likely from his respiratory distress around 110.  We will continue his home rate control medications and bisoprolol.  3.  Chronic diastolic congestive heart failure, stable.  4.  Deep vein thrombosis prophylaxis. The patient is on Eliquis.   CODE STATUS: DNR-DNI, as discussed with the patient.   TIME SPENT ON THIS CASE:  45 minutes.     ____________________________ Leia Alf Yoshika Vensel, MD srs:DT D: 10/03/2014 18:15:06 ET T: 10/03/2014 18:49:59 ET JOB#: [id number]  cc: Alveta Heimlich R. Sudie Bandel, MD, <Dictator> Herbon E. Raul Del, MD  Neita Carp MD ELECTRONICALLY SIGNED 10/03/2014 20:20

## 2014-12-18 NOTE — H&P (Signed)
PATIENT NAME:  Javier [name], Javier [name] MR#:  [id number] DATE OF BIRTH:  09-01-1935  DATE OF ADMISSION:  11/17/2014  REFERRING DOCTOR: Marjean Donna, MD   PRIMARY CARE PRACTITIONER: Francee Nodal, MD  PRIMARY NEUROLOGIST: Lupita Shutter, MD  PRIMARY PULMONOLOGIST: Wallene Huh, MD  ADMITTING PHYSICIAN: Azucena Freed, MD  CHIEF COMPLAINT: Unresponsiveness at home.  HISTORY OF PRESENT ILLNESS: A 79 year old Caucasian male with history of multiple medical problems including hypertension, coronary artery disease status post CABG, chronic atrial fibrillation on Eliquis, COPD on home oxygen, systolic congestive heart failure, history of CVA and TIA, chronic anemia, peripheral artery disease, hyperlipidemia, history of prostatic cancer, history of recurrent syncopal episodes likely seizures versus TIA who presents to the Emergency Room with the complaints of episode of passing out. The patient stated that he went to bed as usual last night and he woke up around 1:00 a.m. to go to the bathroom and he went to the bathroom and returned to his bed and while he was trying to go back to sleep he suddenly went into unresponsiveness and fell on his bed, but did not hurt anywhere. EMS was called on site and found the patient unresponsive and on sternal rub the patient regained consciousness and he was alert, awake, and oriented with no focal deficits. According to the patient, he did not have any seizure activity. No history of any tongue biting. No history of  urine or bowel incontinence. The patient also developed some headache following the syncopal episode and  he has been complaining of some headache for the past 1 to 2 months following starting on some new medications and currently headache is better. Denies any associated chest pain, palpitations or shortness of breath prior to the event. Denies any focal weakness or numbness. Denies any nausea, vomiting, diarrhea, abdominal pain, dysuria, frequency or  urgency. Denies any cough or fever or increased sputum production. On arrival to the Emergency Room, the patient was found to be alert, awake, and oriented x3 and vital signs were stable and examination was completely normal. Work-up in the Emergency Room revealed normal blood tests, except for slightly elevated blood sugars of 124 and chronically mild anemia with stable hemoglobin and hematocrit. CT of the head, noncontrast study, was done which was negative for any acute intracranial bleed. EKG showed chronic atrial fibrillation with controlled ventricular rate. The patient remains comfortable at this time and denies any complaints at this time.   PAST MEDICAL HISTORY: 1.  Hypertension.  2.  Hyperlipidemia.  3.  Chronic atrial fibrillation, on Eliquis. 4.  Coronary artery disease status post CABG.  5.  Carotid artery disease status post carotid endarterectomy.  6.  COPD, on home oxygen.  7.  Chronic systolic congestive heart failure.  8.  Recurrent episodes of loss of consciousness, which were told possible seizure versus TIA.  9.  Chronic anemia.  10.  Hyperlipidemia.  11.  Prostate cancer.  12.  Peripheral arterial disease.  PAST SURGICAL HISTORY: 1.  CABG.  2.  Carotid endarterectomy, bilateral.  3.  Cardiac stents.  4.  Abdominal aortic aneurysm repair.  5.  Lasik surgery, both eyes.  6.  Prostate surgery.   ALLERGIES: No known drug allergies.   FAMILY HISTORY: Father throat cancer and mom with myocardial infarction.   SOCIAL HISTORY:  He is married and lives at home with his wife, daughter and son-in-law. History of smoking, quit in 2014. Denies any history of alcohol or substance abuse.   HOME MEDICATIONS:  1.  Albuterol ipratropium inhalation solution 4 times a day as needed.  2.  Alprazolam 0.5 mg 1 tablet orally every 6 hours as needed for anxiety.  3.  Bisoprolol 5 mg oral tablet 1 tablet orally 2 times a day.  4.  Citalopram 10 mg 1 tablet orally once a day.  5.   Crestor 20 mg 1 tablet orally once a day.  6.  Docusate sodium 100 mg tablet 1 tablet orally 2 times a day.  7.  Eligard 45 mg every 6 months subcutaneous injection.  8.  Eliquis 5 mg oral tablet 1 tablet 2 times a day.  9.  Ferrous sulfate 325 mg 1 tablet orally 2 times a day.  10.  Fish oil capsule 1000 mg 1 capsule orally 2 times a day.  11.  Furosemide 20 mg 1 tablet orally once a day.  12.  Hydromet 1.5 mg/5 mg/mL oral syrup every 6 hours as needed for pain.  13.  Isosorbide mononitrate 30 mg oral tablet extended release 1 tablet orally in the morning.  14.  Levetiracetam 750 mg oral tablet half tablet 2 times a day.  15.  Midodrine 5 mg oral tablet 1 tablet orally 3 times a day as needed for low blood pressure.  16.  Mucinex 600 mg oral tablet extended release 1 tablet 2 times a day as needed for cough.  17.  Nitrostat 0.4 mg sublingual tablet 1 tablet p.r.n. for chest pain.  18.  Morphine 20 mg/mL oral concentrate every 2 hours as needed for shortness of breath.  19.  Pantoprazole 40 mg 1 tablet orally once a day.  20.  Potassium chloride 20 mEq 1 tablet orally once a day.  21.  Robitussin cough syrup as needed.  22.  Senna 8.6 mg 1 tablet orally once a day.  23.  Spiriva 18 mcg inhalation capsule 1 capsule inhalation once a day.  24.  Symbicort 160 mcg/4.5 mcg inhalation solution 1 puff 2 times a day.  25.  Ventolin inhalation aerosol 2 puffs every 6 hours as needed.  26.  Vitamin B12 1000 mcg oral tablet 1 tablet orally once a day.  27.  Vitamin C 1000 mg oral tablet 1 tablet orally once a day.   REVIEW OF SYSTEMS: CONSTITUTIONAL: Negative for fever, chills. No fatigue. No generalized weakness.  EYES: Negative for blurred vision, double vision. No pain. No redness. No discharge.  EARS, NOSE, AND THROAT: Negative for tinnitus, ear pain, hearing loss, epistaxis, nasal discharge. RESPIRATORY: Negative for cough, wheezing, dyspnea, hemoptysis, or painful respiration.   CARDIOVASCULAR: Negative for chest pain, palpitations, dizziness, orthopnea, dyspnea on exertion, pedal edema. Positive for syncopal episode as noted in the history of present illness.  GASTROINTESTINAL: Negative for nausea, vomiting, diarrhea, abdominal pain, hematemesis, melena, rectal bleeding, GERD symptoms. GENITOURINARY: Negative for dysuria, frequency, urgency, or hematuria.  ENDOCRINE: Negative for nocturia, polyuria, heat or cold intolerance.  HEMATOLOGIC AND LYMPHATIC: Negative for easy bruising, bleeding, or swollen glands.  INTEGUMENTARY: Negative for acne, skin rash, or lesions.  MUSCULOSKELETAL: Negative for neck pain or back pain or shoulder pain. No history of arthritis or gout.  NEUROLOGICAL: Negative for focal weakness or numbness. History of TIA/CVA in the past. Questionable history of seizure disorder.  PSYCHIATRIC: Negative for anxiety, insomnia and depression.   PHYSICAL EXAMINATION: VITAL SIGNS: Temperature 97.9 degrees Fahrenheit, pulse rate 98 per minute, respirations 26 per minute, blood pressure 111/82, and O2 saturation 95% on room air.  GENERAL: Well-developed, well-nourished, alert and oriented, in  no acute distress, comfortably resting in the bed.  HEAD: Atraumatic, normocephalic.  EYES: Pupils are equal and react to light and accommodation. No conjunctival pallor. No icterus. Extraocular movements intact.  NOSE: No drainage. No lesions.  EARS: No drainage. No external lesions.  ORAL CAVITY: No mucosal lesions. No exudates.  NECK: Supple. No JVD. No thyromegaly. No carotid bruit. Range of motion of neck within normal limits.  RESPIRATORY: Good respiratory effort. Not using accessory muscles of respiration. Bilateral vesicular breath sounds present. No rales or rhonchi.  CARDIOVASCULAR: S1 and S2 irregularly irregular. No murmurs, gallops or clicks. Pulses equal at carotid, femoral, and pedal pulses. No peripheral edema.  GASTROINTESTINAL: Abdomen is soft and  nontender. No hepatosplenomegaly. No masses. No rigidity. No guarding. Bowel sounds present and equal in all 4 quadrants.  GENITOURINARY: Deferred.  MUSCULOSKELETAL: No joint tenderness or effusion. Range of motion adequate. Strength and tone equal bilaterally.  SKIN: Inspection within normal limits.  LYMPHATIC: No cervical lymphadenopathy.  VASCULAR: Good dorsalis pedis and posterior tibial pulses.  NEUROLOGICAL: Alert, awake, and oriented x3. Cranial nerves II through XII grossly intact. No facial asymmetry. No sensory deficit. Motor strength 5/5 in both upper and lower extremities. DTRs 2+ bilateral and symmetrical. Plantars downgoing.  PSYCHIATRIC: Alert, awake, and oriented x3. Judgment and insight adequate. Memory and mood within normal limits.   DIAGNOSTIC DATA: Labs: Serum glucose 124, BUN 23, creatinine 1.02, sodium 138, potassium 4.4, chloride 104, bicarb 27, total calcium 9, total protein 7.2, albumin 3.7, total bili 0.3, alk phos 46, AST 20, ALT 13. Troponin is less than 0.03. WBC 11.4, hemoglobin 12.6, hematocrit 38.2, platelet count 181,000. Prothrombin time 13.6. INR 1.  Urinalysis unremarkable.   Imaging studies: CT of the abdomen, noncontrast study:  1.  No acute intracranial pathology. 2.  Moderate cortical volume loss and scattered small vessel ischemic microangiopathy.   EKG: Atrial fibrillation with ventricular rate of 99 beats per minute, left axis deviation. No acute ST-T changes.   ASSESSMENT AND PLAN: A 79 year old Caucasian male with a history of multiple medical problems including hypertension, coronary artery disease status post coronary artery bypass graft, chronic atrial fibrillation on Eliquis, chronic obstructive pulmonary disease on home oxygen, sleep apnea, carotid artery disease status post carotid endarterectomy, chronic systolic congestive heart failure, hyperlipidemia, peripheral arterial disease, prostatic cancer status post prostate surgery, chronic anemia,  recurrent episodes of loss of consciousness possibly secondary to seizure versus transient ischemic attack, presents with unresponsiveness secondary due to syncopal event. Work-up in the ED unremarkable. Labs and CT of head negative for any acute intracranial pathology, and the patient is alert, awake, and oriented with normal neuro examination.  1.  Episode of unresponsiveness witnessed by the family members-syncope . The patient regained consciousness at home. The patient is stable. Work-up with normal labs and normal neuro examination. CT of head negative for acute intracranial pathology. Rule out any central nervous system event, rule out cardiac event, query orthostatic. Plan: Admit to telemetry for observation. Neuro watch. Cycle cardiac enzymes. Carotid Doppler and echocardiogram requested. Neurology consult requested for further input.  2.  History of chronic atrial fibrillation with controlled ventricular rate. The patient is on Eliquis and stable clinically. Continue same.  3.  History of recurrent episodes of loss of consciousness for the past few months. Work-up in the outpatient was opined as possible seizure versus transient ischemic attack versus orthostatic. The patient on levetiracetam per neurologist, Dr. Melrose Nakayama. Continue same.  4.  Coronary artery disease status post coronary  artery bypass graft, stable. No chest pain. EKG with no acute changes and troponin x1 negative. Continue home medications.  5.  Chronic obstructive pulmonary disease, on home oxygen, stable. Continue home medications and oxygen supplementation.  6.  History of transient ischemic attack, cerebrovascular accident, stable. No focal deficits on examination. CT head negative for acute intracranial pathology.  7.  Coronary artery disease status post carotid endarterectomy, stable. We will get carotid Dopplers because of syncopal episode.  8.  Chronic systolic congestive heart failure. Stable on home medications. Continue  same.  9.  Hypertension. Controlled on home medications. Continue same. 10.  Hyperlipidemia. Stable on statin. Continue same.  11.  Chronic anemia. Stable on iron supplements. Continue same.  12.  Deep vein thrombosis prophylaxis. Eliquis. Continue same.  13.  Gastrointestinal prophylaxis. Proton pump inhibitor.   CODE STATUS: DO NOT RESUSCITATE.   TIME SPENT: 55 minutes.  ____________________________ Juluis Mire, MD enr:sb D: 11/17/2014 06:48:42 ET T: 11/17/2014 07:10:06 ET JOB#: [id number]  cc: Juluis Mire, MD, <Dictator> Francee Nodal, MD Minna Merritts, MD [facility] Melrose Nakayama, MD   Juluis Mire MD ELECTRONICALLY SIGNED 11/17/2014 18:31

## 2014-12-18 NOTE — Discharge Summary (Signed)
PATIENT NAME:  Javier [name], Javier [name] MR#:  [id number] DATE OF BIRTH:  [birth year]  DATE OF ADMISSION:  08/30/2014 DATE OF DISCHARGE:  08/31/2014  DISCHARGE DIAGNOSES: 1.  Noncardiac chest pain.  2.  History of chronic obstructive pulmonary disease.  3.  History of chronic atrial fibrillation.  4.  Chronic diastolic heart failure.   DISCHARGE MEDICATIONS: Symbicort 160/4.5 mcg inhalation 2 puffs b.i.d., ELigard > 45 mg every 6 months, Ventolin 90 mcg 2 puffs every 4 hours for shortness of breath, Crestor 20 mg p.o. daily, fish oil 1 g p.o. b.i.d.,  Colace 100 mg p.o. b.i.d. as needed for constipation, Spiriva 18 mcg inhalation p.o. daily, senna 8.6 mg p.o. daily, Mucinex 600 mg p.o. b.i.d., pantoprazole 40 mg p.o. b.i.d., bisoprolol 5 mg p.o. b.i.d., ferrous sulfate 325 mg p.o. b.i.d., aspirin 81 mg p.o. daily,., vitamin C 1000 units once a day, Eliquis 5 mg p.o. b.i.d., albuterol with ipratropium nebulizer every 6 hours as needed for shortness of breath, Xanax 0.5 mg every 6 hours, vitamin B 12,000 mcg p.o. daily, Hydromet 1.5/5 mg 5 mL every 6 hours as needed for pain, nystatin 10,000 units per mL the patient is on 5 mL b.i.d., Zoloft 100 mg 1-1/2 tablet once a day, Keppra 750 mg 1/2 tablet b.i.d. Imdur is increased to 60 mg p.o. daily. Oxygen 2 L by nasal cannula.   CONSULTATIONS: None.   HOSPITAL COURSE: The patient is a 79 year old male patient who is here, admitted 5 times in the last 6 months, comes in because of chest pain. The patient follows with Dr. Rockey Situ for his cardiac history. The patient is admitted because of chest pain. The patient's troponins were negative x 3. He ruled out and he is monitored on telemetry. We continued his aspirin, beta blockers, and statins. The patient did not have chest pain yesterday, and I spoke with [facility] cardiology and the patient's Imdur has been increased to 60 mg daily. The patient advised to follow up with Dr. Esmond Plants in about a week. The patient agreed with  this and he did ask for some Xanax when he was in the hospital for anxiety. Chest pain is thought to be secondary to GERD and anxiety rather than acute coronary syndrome. The patient felt better without any further symptoms and he had a workup including cardiac catheterization and stress test July of the last year so the patient can see Dr. Rockey Situ in about a week.   DISCHARGE VITAL SIGNS: Temperature 99.5, heart rate 91, blood pressure 104/69, saturations 95% on 2 L.  PHYSICAL EXAMINATION: GENERAL: Showed awake, alert, oriented. CARDIOVASCULAR: S1, S2 regular. LUNGS: Clear to auscultation. No wheeze, no rales.  GASTROINTESTINAL: Abdomen was soft, nontender, nondistended. Bowel sounds present.  CONDITION: The patient's condition was stable at discharge.  TIME SPENT: More than 30 minutes.    ____________________________ Epifanio Lesches, MD sk:ST D: 09/03/2014 11:55:35 ET T: 09/03/2014 22:34:06 ET JOB#: [id number]  cc: Epifanio Lesches, MD, <Dictator> Epifanio Lesches MD ELECTRONICALLY SIGNED 09/20/2014 11:59

## 2014-12-18 NOTE — H&P (Signed)
PATIENT NAME:  Javier [name], Javier [name] MR#:  [id number] DATE OF BIRTH:  1935/12/19  DATE OF ADMISSION:  08/30/2014  PRIMARY CARE PHYSICIAN: Francee Nodal, MD  REFERRING PHYSICIAN: Ahmed Prima, MD   PRIMARY CARDIOLOGIST: Minna Merritts, MD  CHIEF COMPLAINT: Chest pain.  HISTORY OF PRESENT ILLNESS: Javier [name] with a history of atrial fibrillation on chronic anticoagulation, hypertension, hyperlipidemia, coronary artery disease status post coronary artery bypass grafting, came to the Emergency Department with complaints of chest pain while at rest. The patient states the pain started on the left side of the chest, pressure-like pain, radiating to the left arm. He took some nitroglycerin with some improvement. However, workup in the Emergency Department with an EKG and cardiac enzymes was unremarkable. The patient denies any chest pain at rest.  PAST MEDICAL HISTORY: 1.  Atrial fibrillation with chronic anticoagulation. 2.  COPD. 3.  Hyperlipidemia.  4.  Diastolic congestive heart failure. 5.  Coronary artery disease status post coronary artery bypass grafting. 6.  Hypertension. 7.  Diabetes mellitus.  PAST SURGICAL HISTORY: 1.  Carotid endarterectomy. 2.  Four stents placement. 3.  LASIK surgery to both eyes. 4.  AAA repair. 5.  Coronary artery bypass grafting.  ALLERGIES: No known drug allergies.  HOME MEDICATIONS: 1.  Zoloft 100 mg once a day. 2.  Vitamin C 1000 mg once a day. 3.  Vitamin B12, 1000 mcg once a day. 4.  Ventolin 2 puffs 4 times a day. 5.  Symbicort 2 puffs 2 times a day. 6.  Spiriva 1 capsule once a day. 7.  Senna 8.6 mg once a day. 8.  Robitussin every 4 hours as needed. 9.  Potassium chloride 20 mEq daily. 10.  Protonix 40 mg 2 times a day. 11.  Nystatin 2 times a day as needed. 12.  Mucinex 600 mg 2 times a day. 13.  Midodrine 5 mg times a dad. 14.  Keppra 500 mg 2 times a day. 15.  Imdur 30 mg once a day. 16.  Hydromet every 6 hours as  needed. 17.  Lasix 20 mg once a day. 18.  Fludrocortisone 0.1 mg once a day. 19.  Fish oil 1000 mg 2 times a day. 20.  Ferrous sulfate 325 mg 2 times a day. 21.  Docusate sodium 100 mg 2 times a day. 22.  Crestor 20 mg once a day. 23.  Bisoprolol 5 mg 2 times a day. 24.  Aspirin 81 mg once a day. 25.  Alprazolam 0.5 mg every 6 hours as needed. 26.  DuoNeb every 4 hours 4 times a day.  SOCIAL HISTORY: Quit smoking 2 years back; smoked heavily. Denies drinking alcohol or using illicit drugs. Currently lives with his wife and daughter.  FAMILY HISTORY: Coronary artery disease and throat cancer.  REVIEW OF SYSTEMS: CONSTITUTIONAL: Generalized weakness. EYES: No change in vision. EARS, NOSE, AND THROAT: No change in hearing. RESPIRATORY: No cough, shortness of breath. CARDIOVASCULAR: Has chest pain. GASTROINTESTINAL: No nausea, vomiting, abdominal pain. GENITOURINARY: No dysuria or hematuria. HEMATOLOGIC: No easy bruising or bleeding. SKIN: No rash or lesions. MUSCULOSKELETAL: No joint pain. NEUROLOGIC: No weakness or numbness in any part of the body.  PHYSICAL EXAMINATION: GENERAL: This is a well-built, well-nourished, age-appropriate male lying down in the bed not in distress. VITAL SIGNS: Temperature 98.3, pulse 87, blood pressure , respiratory rate of 20, oxygen saturation 100% on room air. HEENT: Head normocephalic, atraumatic. There is no scleral icterus. Conjunctivae normal. Pupils equal and react to light. Mucous  membranes moist. No pharyngeal erythema. NECK: Supple. No lymphadenopathy. No JVD. No carotid bruit.  CHEST: Has no focal tenderness. LUNGS: Bilaterally clear to auscultation. HEART: S1, S2 regular. No murmurs are heard. ABDOMEN: Bowel sounds plus. Soft, nontender, nondistended. No hepatosplenomegaly. EXTREMITIES: No pedal edema. Pulses 2+. NEUROLOGIC: Patient is alert, oriented to place, person, and time. Cranial nerves II-XII are intact. Motor 5/5 in upper and  lower extremities.  SKIN: No rash or lesions.  LABORATORY DATA: BMP is completely within normal limits. BNP 1880. CBC: WBC of 16.7, hemoglobin 12.5, platelet count of 249,000. Chest x-ray, 1 view, portable: No acute cardiopulmonary disease.  ASSESSMENT AND PLAN: Javier [name] is a 79 year old male who comes with chest pain. 1.  Chest pain: Patient has multiple risk factors. Admit the patient to be monitored. Will cycle cardiac enzymes x 3. The patient states had a left heart catheterization done about 4 months back and patient does not remember the report of it. Consider consulting Dr. Rockey Situ in order to obtain the report. If negative, patient could be discharged home. 2.  Hypertension: Currently well controlled. Continue the home medications. 3.  History of chronic obstructive pulmonary disease: Continue the DuoNebs as needed. 4.  Congestive heart failure: Does not seem to be in any distress. Seems to be euvolemic. Will continue to follow.  5.  History of atrial fibrillation: Rate is controlled. Not on any anticoagulation. 6.  Keep the patient on deep venous thrombosis prophylaxis with Lovenox.  TIME SPENT: 50 minutes.   ____________________________ Monica Becton, MD pv:ST D: 08/30/2014 01:26:04 ET T: 08/30/2014 02:15:40 ET JOB#: [id number]  cc: Monica Becton, MD, <Dictator> Francee Nodal, MD Monica Becton MD ELECTRONICALLY SIGNED 09/09/2014 21:30

## 2014-12-18 NOTE — Discharge Summary (Signed)
PATIENT NAME:  Javier [name], Javier [name] MR#:  [id number] DATE OF BIRTH:  20-Jun-1936  ADMITTING PHYSICIAN: Juluis Mire, MD  DISCHARGING PHYSICIAN: Gladstone Lighter, MD   PRIMARY CARE PHYSICIAN: Dr. Mila Palmer   CONSULTATIONS IN THE HOSPITAL:  1. Neurology consultation by Mertie Clause. Fletcher Anon, MD   2. Neurology consultation by Leotis Pain, MD  DISCHARGE DIAGNOSES:  1. Frequent syncope, likely from orthostatic hypotension.  2. Coronary artery disease.  3. Diastolic congestive heart failure.  4. Chronic obstructive pulmonary disease on chronic 2 liters home oxygen.  5. Atrial fibrillation on Eliquis.  6. Orthostatic hypotension on midodrine.  7. Recurrent seizures versus transient ischemic attack, being worked up as an outpatient.  8. Severe anxiety.   DISCHARGE HOME MEDICATIONS:  1. Symbicort 160/4.5 mcg 2 puffs twice a day inhalation.  2. Eligard 45 mg every 6 months subcutaneous injection.  3. Ventolin inhaler 2 puffs every 4 hours as needed for shortness of breath.  4. Nitrostat 0.4 mg sublingual p.r.n. for chest pain every 5 minutes.  5. Crestor 20 mg a p.o. daily.  6. Fish oil 1 gram capsule p.o. b.i.d.  7. Colace 100 mg 3 b.i.d.  8. Spiriva inhalation capsule daily.  9. Senna 8.6 mg p.o. daily.  10. Mucinex 600 mg p.o. b.i.d. p.r.n. for cough, congestion.  11. Protonix 40 mg p.o. b.i.d.  12. Bisoprolol 5 mg p.o.  13. Vitamin C 1000 mg p.o. daily.  14. Eliquis 5 mg p.o. b.i.d.  15. DuoNebs 3 mL 4 times a day as needed for shortness of breath.  16. Nystatin 5 mL orally twice a day. 17. Hydromet 1.5 mg-5 mg/5 mL syrup - 5 mL orally every 6 hours p.r.n. for pain.  18. Robitussin 10 mL every 4 hours p.r.n. for cough.  19. Ferrous sulfate 325 mg b.i.d.  20. Imdur 30 mg p.o. daily.  21. Vitamin B12 1000 mcg p.o. daily.  22. Keppra 375 mg p.o. b.i.d.  23. Lasix 20 mg p.o. daily.  24. Potassium chloride 20 mEq p.o. daily.  25. Roxanol 20 mg/mL oral concentrate - 0.25 mL  every 2 hours as needed for shortness of breath.  26. Celexa 20 mg p.o. daily.   27. Xanax 0.25 mg every 6 hours p.r.n. for anxiety.  28. Midodrine 5 mg p.o. t.i.d. for low blood pressure, orthostatic hypotension.   DISCHARGE HOME OXYGEN: 2 liters.   DISCHARGE DIET: Low-sodium diet.   DISCHARGE ACTIVITY: As tolerated.    FOLLOWUP INSTRUCTIONS:  1. Resume hospice services at home.  2. Cardiology followup in 1 week for loop recorder or event monitor.  3. Neurology followup with Dr. Melrose Nakayama in 1 to 2 weeks.  4. PCP followup in 2 weeks.   LABORATORIES AND IMAGING STUDIES PRIOR TO DISCHARGE: WBC 11.4, hemoglobin 12.6, hematocrit 38.2, platelet count 181,000.   INR is 1.0.   Sodium 138, potassium 4.4, chloride 104, bicarbonate 27, BUN 23, creatinine 1.02, glucose 124, and calcium of 9.0.   ALT 13, AST 20, alkaline phosphatase 46, total bilirubin 0.3, albumin of 3.7. Troponin is negative this admission.   Urinalysis negative for any infection.   CT of the head without contrast showing no acute intracranial pathology, scattered small vessel ischemic changes seen.   Echo Doppler showing normal LV ejection fraction, ejection fraction of 55 to 60%, mild inferior and inferolateral hypokinesis, concentric LV arch, moderately dilated left atrium noted. Mild to moderate aortic valve sclerosis without stenosis.   Ultrasound Dopplers carotid bilaterally showing no evidence of any hemodynamically  significant stenosis.   BRIEF HOSPITAL COURSE: The patient is a 79 year old Caucasian male with multiple admissions to the hospital and multiple medical problems including coronary artery disease, chronic atrial fibrillation on Eliquis, chronic obstructive pulmonary disease, diastolic congestive heart failure, generalized anxiety disorder, hypertension, recurrent seizure/syncope, who presents to the hospital secondary to a syncopal episode.   Frequent syncope, more frequent in the last month. No new  medications were started. Happening mostly in the evening. The patient is always having guarded falls. He is not collapsing or having any injuries. Most of the episodes are either on the bed or safely falling onto the floor. No weakness, seizures. He is already being followed by Dr. Melrose Nakayama as an outpatient for possible seizures. EEG is negative. He is on low-dose Keppra, as regular strength Keppra was causing him headaches. Seeing the neurologist, multiple MRIs in the past, most recent one less than 6 months ago. No plan to repeat MRI. This does not look like to be a seizure. Monitor on telemetry. No issues with heart. Seen by cardiologist. He had event monitor in the past, which was normal, but since the episodes are more frequent now, plan to do another event monitor for 30 days versus loop recorder. He will follow up with Dr. Fletcher Anon as an outpatient for the same. He also has a history of orthostatic hypotension and was on midodrine p.r.n., but that is being changed to a scheduled dose at this time at this discharge. His blood pressure has been low normal in the hospital without any orthostatic drop. No evidence of any infection. He is being discharged on all his home medications in a stable condition. He is followed by hospice services.   CODE STATUS: DO NOT RESUSCITATE.   DISCHARGE CONDITION: Guarded.   DISCHARGE DISPOSITION: Home with hospice services.   TIME SPENT ON DISCHARGE: 45 minutes.   High risk for readmission.   ____________________________ Gladstone Lighter, MD rk:AT D: 11/18/2014 15:08:15 ET T: 11/18/2014 15:47:06 ET JOB#: [id number]  cc: Gladstone Lighter, MD, <Dictator> Muhammad A. Fletcher Anon, MD [facility] Melrose Nakayama, MD unknown cc Gladstone Lighter MD ELECTRONICALLY SIGNED 11/24/2014 12:29

## 2014-12-18 NOTE — Discharge Summary (Signed)
PATIENT NAME:  Javier [name], Javier [name] MR#:  [id number] DATE OF BIRTH:  Jan 23, 1936  DATE OF ADMISSION:  10/03/2014 DATE OF DISCHARGE:  10/06/2014  For detailed note please see history and physical done at admission by Dr. Darvin Neighbours.   DIAGNOSES AT DISCHARGE: As follows:  1.  Acute on chronic respiratory failure secondary to chronic obstructive pulmonary disease exacerbation.  2.  Chronic obstructive pulmonary disease exacerbation and chronic O2. 3.  Chronic atrial fibrillation. 4.  Chronic systolic congestive heart failure. 5.  Hyperlipidemia.  6.  Gastroesophageal reflux disease.  7.  Orthostatic hypotension.  8.  Chronic iron deficiency anemia.   DIET: The patient is being discharged on a low-sodium, low-fat diet.   ACTIVITY: As tolerated.   FOLLOW UP:  Follow up with Dr. Mila Palmer in the next 1-2 weeks. The patient is being discharged home with hospice services.   DISCHARGE MEDICATIONS:  1.  Symbicort 160/4.5 mg 2 puffs b.i.d. 2.  Eligard 25 mg subcutaneous every 6 months. 3.  Albuterol inhaler 2 puffs q. 4 hours as needed. 4.  Sublingual nitroglycerin as needed. 5.  Crestor 20 mg daily. 6.  Fish oil 1000 mg b.i.d. 7.  Colace 100 mg b.i.d. 8.  Spiriva 1 puff daily. 9.  Senokot 1 tablet daily. 10.  Mucinex 600 mg b.i.d. as needed. 11.  Protonix 40 mg b.i.d. 12.  Bisoprolol 5 mg b.i.d. 13.  Midodrine 5 mg t.i.d. as needed. 14.  Vitamin C 1000 mg daily. 15.  Eliquis 5 mg b.i.d. 16.  Xanax 0.5 mg q. 6 hours as needed. 17.  DuoNeb every 4 hours as needed. 18.  Nystatin 5 mL b.i.d.  19.  Hydromet 1.5/5 mL q. 6 hours as needed. 20.  Robitussin 10 mL q. 4 hours as needed. 21.  Aspirin 81 mg daily. 22.  Celexa 10 mg daily. 23.  Iron sulfate 325 mg b.i.d. 24.  Imdur 30 mg daily. 25.  Vitamin B12 1000 mcg daily. 26.  Keppra 750 mg 1/2 tablet b.i.d. 27.  Lasix 20 mg daily. 28.  Potassium 20 mEq daily. 29.  Prednisone taper starting at 50 mg daily and 10 mg in the next 10 days. 30.   Levaquin 5 mg daily x5 days. 31.  Roxanol 0.25 mL every 2 hours as needed.   [facility] COURSE: Dr. Izora Gala Ph[facility] of palliative care.   PERTINENT STUDIES DONE DURING THE HOSPITAL COURSE: A chest x-ray done on admission showing stable cardiomegaly with mild pulmonary vascular congestion.   BRIEF HOSPITAL COURSE: This is a 79 year old male with medical problems as mentioned above, presented to the hospital with shortness of breath, wheezing and cough and noted to be in mild chronic obstructive pulmonary disease exacerbation.   1.  Chronic obstructive pulmonary disease exacerbation. This was likely the cause of the patient's shortness of breath, wheezing, and bronchospasm. The patient was admitted to the hospital, started on IV steroids, maintained on her Symbicort, Spiriva and also around-the-clock nebulizer treatments, and empiric antibiotics. After getting aggressive therapy, the patient's clinical symptoms have improved. His shortness of breath and bronchospasm has significantly improved. He was seen by palliative care for symptom management and started on some Roxanol which seemed to have helped his air hunger. At this point, the patient is being discharged on a long oral prednisone taper along with Levaquin and also Roxanol for air hunger. He will continue his maintenance inhalers including Symbicort, Spiriva, and albuterol as needed along with his DuoNeb.  2.  Chronic atrial fibrillation  with RVR. The patient's rates remained controlled. He will continue his bisoprolol. He was on Eliquis.  He will continue that.   3.  History of diastolic congestive heart failure. Clinically, the patient was not in any congestive heart failure while in the hospital. He will continue his Lasix and bisoprolol.  4.  Weakness. This is probably generalized weakness related to his deconditioning from his congestive heart failure and chronic obstructive pulmonary disease. The patient was seen by  physical therapy. They recommended home health physical therapy but the patient is being discharged with hospice services and if needed hospice can arrange PT for him.   5.  History of orthostatic hypotension with syncope. He will continue his Midrin as needed.   6.  History of previous transient ischemic attacks and carotid artery disease. The patient will continue his Eliquis and statin.  7.  Coronary artery disease. He had no acute chest pain. His troponins were negative. He will continue his regular medical management.  8.  Depression/anxiety. The patient will continue his Xanax and fluoxetine as stated.   CODE STATUS: The patient is a DO NOT INTUBATE/DO NOT RESUSCITATE and is being discharged home with hospice services.   TIME SPENT: 40 minutes.   ____________________________ Belia Heman. Verdell Carmine, MD vjs:mc D: 10/06/2014 16:02:51 ET T: 10/06/2014 16:30:34 ET JOB#: [id number]  cc: Belia Heman. Verdell Carmine, MD, <Dictator> Henreitta Leber MD ELECTRONICALLY SIGNED 10/19/2014 15:31

## 2014-12-27 ENCOUNTER — Ambulatory Visit: Payer: Medicare Other | Admitting: Cardiovascular Disease

## 2015-01-18 DEATH — deceased

## 2015-02-01 ENCOUNTER — Ambulatory Visit: Payer: Medicare Other | Admitting: Cardiovascular Disease
